# Patient Record
Sex: Female | Born: 1945 | Race: Black or African American | Hispanic: No | Marital: Married | Smoking: Current every day smoker
Health system: Southern US, Community
[De-identification: ages and names within clinical notes are randomized; demographics above are authoritative.]

## PROBLEM LIST (undated history)

## (undated) DIAGNOSIS — I639 Cerebral infarction, unspecified: Secondary | ICD-10-CM

## (undated) DIAGNOSIS — I10 Essential (primary) hypertension: Secondary | ICD-10-CM

## (undated) HISTORY — PX: CHOLECYSTECTOMY: SHX55

## (undated) HISTORY — PX: ABDOMINAL HYSTERECTOMY: SHX81

---

## 2004-05-20 ENCOUNTER — Ambulatory Visit: Payer: Self-pay | Admitting: Internal Medicine

## 2004-06-12 ENCOUNTER — Ambulatory Visit: Payer: Self-pay | Admitting: Internal Medicine

## 2004-08-15 ENCOUNTER — Ambulatory Visit: Payer: Self-pay | Admitting: Internal Medicine

## 2004-08-26 ENCOUNTER — Ambulatory Visit: Payer: Self-pay | Admitting: Gastroenterology

## 2004-09-12 ENCOUNTER — Ambulatory Visit: Payer: Self-pay | Admitting: Gastroenterology

## 2006-05-19 ENCOUNTER — Ambulatory Visit: Payer: Self-pay

## 2006-06-05 ENCOUNTER — Ambulatory Visit: Payer: Self-pay | Admitting: Otolaryngology

## 2006-11-30 ENCOUNTER — Ambulatory Visit: Payer: Self-pay | Admitting: Otolaryngology

## 2007-03-03 ENCOUNTER — Ambulatory Visit: Payer: Self-pay | Admitting: Otolaryngology

## 2007-09-13 ENCOUNTER — Ambulatory Visit: Payer: Self-pay | Admitting: Otolaryngology

## 2007-12-09 ENCOUNTER — Ambulatory Visit: Payer: Self-pay | Admitting: Internal Medicine

## 2007-12-28 ENCOUNTER — Ambulatory Visit: Payer: Self-pay | Admitting: Internal Medicine

## 2008-03-31 ENCOUNTER — Ambulatory Visit: Payer: Self-pay | Admitting: Otolaryngology

## 2008-06-20 ENCOUNTER — Ambulatory Visit: Payer: Self-pay | Admitting: Internal Medicine

## 2008-07-20 ENCOUNTER — Ambulatory Visit: Payer: Self-pay | Admitting: Internal Medicine

## 2008-10-12 ENCOUNTER — Ambulatory Visit: Payer: Self-pay | Admitting: Otolaryngology

## 2009-01-09 ENCOUNTER — Ambulatory Visit: Payer: Self-pay | Admitting: Internal Medicine

## 2009-11-13 ENCOUNTER — Other Ambulatory Visit: Payer: Self-pay | Admitting: Otolaryngology

## 2010-01-15 ENCOUNTER — Ambulatory Visit: Payer: Self-pay

## 2010-12-11 ENCOUNTER — Other Ambulatory Visit: Payer: Self-pay | Admitting: Otolaryngology

## 2010-12-17 ENCOUNTER — Ambulatory Visit: Payer: Self-pay | Admitting: Otolaryngology

## 2011-01-17 ENCOUNTER — Ambulatory Visit: Payer: Self-pay | Admitting: Internal Medicine

## 2011-12-08 ENCOUNTER — Other Ambulatory Visit: Payer: Self-pay | Admitting: Otolaryngology

## 2011-12-08 LAB — T4, FREE: Free Thyroxine: 1.24 ng/dL (ref 0.76–1.46)

## 2012-02-10 ENCOUNTER — Ambulatory Visit: Payer: Self-pay | Admitting: Internal Medicine

## 2012-02-10 ENCOUNTER — Other Ambulatory Visit: Payer: Self-pay

## 2012-02-10 LAB — CBC WITH DIFFERENTIAL/PLATELET
Basophil #: 0 10*3/uL (ref 0.0–0.1)
Basophil %: 0.6 %
Eosinophil #: 0 10*3/uL (ref 0.0–0.7)
Eosinophil %: 0.7 %
Lymphocyte #: 2.2 10*3/uL (ref 1.0–3.6)
Lymphocyte %: 32.5 %
MCHC: 31.9 g/dL — ABNORMAL LOW (ref 32.0–36.0)
Monocyte #: 0.5 x10 3/mm (ref 0.2–0.9)
Neutrophil %: 58.3 %
Platelet: 246 10*3/uL (ref 150–440)
RBC: 5.02 10*6/uL (ref 3.80–5.20)
RDW: 16.1 % — ABNORMAL HIGH (ref 11.5–14.5)

## 2012-02-10 LAB — BASIC METABOLIC PANEL
Chloride: 110 mmol/L — ABNORMAL HIGH (ref 98–107)
Co2: 29 mmol/L (ref 21–32)
Osmolality: 280 (ref 275–301)
Sodium: 142 mmol/L (ref 136–145)

## 2012-02-10 LAB — URINALYSIS, COMPLETE
Bilirubin,UR: NEGATIVE
Glucose,UR: NEGATIVE mg/dL (ref 0–75)
Ketone: NEGATIVE
Leukocyte Esterase: NEGATIVE
Nitrite: NEGATIVE
Ph: 5 (ref 4.5–8.0)
Protein: NEGATIVE
RBC,UR: 2 /HPF (ref 0–5)
Squamous Epithelial: 3
WBC UR: 2 /HPF (ref 0–5)

## 2012-02-10 LAB — HEPATIC FUNCTION PANEL A (ARMC)
Alkaline Phosphatase: 109 U/L (ref 50–136)
SGOT(AST): 20 U/L (ref 15–37)

## 2013-01-21 ENCOUNTER — Ambulatory Visit: Payer: Self-pay | Admitting: Otolaryngology

## 2013-01-21 LAB — T4, FREE: Free Thyroxine: 1.2 ng/dL (ref 0.76–1.46)

## 2013-01-21 LAB — TSH: Thyroid Stimulating Horm: 0.23 u[IU]/mL — ABNORMAL LOW

## 2013-02-08 ENCOUNTER — Ambulatory Visit: Payer: Self-pay | Admitting: Otolaryngology

## 2014-02-20 ENCOUNTER — Ambulatory Visit: Payer: Self-pay | Admitting: Otolaryngology

## 2014-02-20 LAB — TSH: Thyroid Stimulating Horm: 0.278 u[IU]/mL — ABNORMAL LOW

## 2014-02-20 LAB — T4, FREE: Free Thyroxine: 1.14 ng/dL (ref 0.76–1.46)

## 2015-03-07 ENCOUNTER — Other Ambulatory Visit
Admission: RE | Admit: 2015-03-07 | Discharge: 2015-03-07 | Disposition: A | Discharge status: Home / Self Care | Payer: 59 | Source: Ambulatory Visit | Attending: Otolaryngology | Admitting: Otolaryngology

## 2015-03-07 ENCOUNTER — Other Ambulatory Visit
Admission: RE | Admit: 2015-03-07 | Discharge: 2015-03-07 | Disposition: A | Discharge status: Home / Self Care | Payer: Medicare Other | Source: Ambulatory Visit | Attending: Otolaryngology | Admitting: Otolaryngology

## 2015-03-07 DIAGNOSIS — E041 Nontoxic single thyroid nodule: Secondary | ICD-10-CM | POA: Insufficient documentation

## 2015-03-07 DIAGNOSIS — R0981 Nasal congestion: Secondary | ICD-10-CM | POA: Diagnosis not present

## 2015-03-07 LAB — T4, FREE: Free T4: 0.89 ng/dL (ref 0.61–1.12)

## 2015-03-07 LAB — TSH: TSH: 0.716 u[IU]/mL (ref 0.350–4.500)

## 2015-03-09 LAB — T3, FREE: T3 FREE: 2.4 pg/mL (ref 2.0–4.4)

## 2016-03-17 ENCOUNTER — Other Ambulatory Visit
Admission: RE | Admit: 2016-03-17 | Discharge: 2016-03-17 | Disposition: A | Discharge status: Home / Self Care | Payer: Medicare Other | Source: Ambulatory Visit | Attending: Otolaryngology | Admitting: Otolaryngology

## 2016-03-17 DIAGNOSIS — E041 Nontoxic single thyroid nodule: Secondary | ICD-10-CM | POA: Insufficient documentation

## 2016-03-17 LAB — T4, FREE: Free T4: 0.91 ng/dL (ref 0.61–1.12)

## 2016-03-17 LAB — TSH: TSH: 0.766 u[IU]/mL (ref 0.350–4.500)

## 2016-03-18 LAB — T3, FREE: T3 FREE: 2.5 pg/mL (ref 2.0–4.4)

## 2017-04-24 ENCOUNTER — Other Ambulatory Visit
Admission: RE | Admit: 2017-04-24 | Discharge: 2017-04-24 | Disposition: A | Discharge status: Home / Self Care | Payer: Medicare Other | Source: Ambulatory Visit | Attending: Otolaryngology | Admitting: Otolaryngology

## 2017-04-24 DIAGNOSIS — E041 Nontoxic single thyroid nodule: Secondary | ICD-10-CM | POA: Diagnosis present

## 2017-04-24 LAB — TSH: TSH: 0.391 u[IU]/mL (ref 0.350–4.500)

## 2017-04-24 LAB — T4, FREE: Free T4: 1.09 ng/dL (ref 0.61–1.12)

## 2017-04-25 LAB — T3, FREE: T3, Free: 2.4 pg/mL (ref 2.0–4.4)

## 2018-06-07 ENCOUNTER — Other Ambulatory Visit
Admission: RE | Admit: 2018-06-07 | Discharge: 2018-06-07 | Disposition: A | Discharge status: Home / Self Care | Payer: Medicare HMO | Attending: Otolaryngology | Admitting: Otolaryngology

## 2018-06-07 DIAGNOSIS — E041 Nontoxic single thyroid nodule: Secondary | ICD-10-CM | POA: Diagnosis not present

## 2018-06-07 LAB — TSH: TSH: 0.926 u[IU]/mL (ref 0.350–4.500)

## 2018-06-07 LAB — T4, FREE: Free T4: 0.85 ng/dL (ref 0.82–1.77)

## 2018-06-08 LAB — T3, FREE: T3, Free: 2.6 pg/mL (ref 2.0–4.4)

## 2018-06-16 DIAGNOSIS — H6123 Impacted cerumen, bilateral: Secondary | ICD-10-CM | POA: Diagnosis not present

## 2018-06-16 DIAGNOSIS — E041 Nontoxic single thyroid nodule: Secondary | ICD-10-CM | POA: Diagnosis not present

## 2018-10-22 DIAGNOSIS — L258 Unspecified contact dermatitis due to other agents: Secondary | ICD-10-CM | POA: Diagnosis not present

## 2018-10-22 DIAGNOSIS — L309 Dermatitis, unspecified: Secondary | ICD-10-CM | POA: Diagnosis not present

## 2018-12-10 ENCOUNTER — Other Ambulatory Visit: Payer: Self-pay

## 2018-12-10 DIAGNOSIS — Z20822 Contact with and (suspected) exposure to covid-19: Secondary | ICD-10-CM

## 2018-12-13 ENCOUNTER — Telehealth: Payer: Self-pay

## 2018-12-13 LAB — NOVEL CORONAVIRUS, NAA: SARS-CoV-2, NAA: NOT DETECTED

## 2018-12-13 NOTE — Telephone Encounter (Signed)
Received call from patient checking Covid results.  Advised results negative.   

## 2019-02-25 DIAGNOSIS — H6123 Impacted cerumen, bilateral: Secondary | ICD-10-CM | POA: Diagnosis not present

## 2019-02-28 ENCOUNTER — Ambulatory Visit: Payer: Medicare HMO | Attending: Internal Medicine

## 2019-02-28 ENCOUNTER — Other Ambulatory Visit: Payer: Self-pay

## 2019-02-28 DIAGNOSIS — Z23 Encounter for immunization: Secondary | ICD-10-CM | POA: Insufficient documentation

## 2019-02-28 NOTE — Progress Notes (Signed)
   Covid-19 Vaccination Clinic  Name:  Susan Farrell    MRN: [id number] DOB: Oct 31, 1945  02/28/2019  [name] was observed post Covid-19 immunization for 15 minutes without incidence. She was provided with Vaccine Information Sheet and instruction to access the V-Safe system.   [name] was instructed to call 911 with any severe reactions post vaccine: Marland Kitchen Difficulty breathing  . Swelling of your face and throat  . A fast heartbeat  . A bad rash all over your body  . Dizziness and weakness    Immunizations Administered    Name Date Dose VIS Date Route   Moderna COVID-19 Vaccine 02/28/2019 10:31 AM 0.5 mL 12/21/2018 Intramuscular   Manufacturer: Moderna   Lot: [id number]   NDC: 41991-444-58

## 2019-03-08 DIAGNOSIS — L930 Discoid lupus erythematosus: Secondary | ICD-10-CM | POA: Diagnosis not present

## 2019-03-30 ENCOUNTER — Other Ambulatory Visit: Payer: Self-pay

## 2019-03-30 ENCOUNTER — Ambulatory Visit: Payer: Medicare HMO | Attending: Internal Medicine

## 2019-03-30 DIAGNOSIS — Z23 Encounter for immunization: Secondary | ICD-10-CM | POA: Insufficient documentation

## 2019-03-30 NOTE — Progress Notes (Signed)
   Covid-19 Vaccination Clinic  Name:  Susan Farrell    MRN: [id number] DOB: May 07, 1945  03/30/2019  [name] was observed post Covid-19 immunization for 15 minutes without incident. She was provided with Vaccine Information Sheet and instruction to access the V-Safe system.   [name] was instructed to call 911 with any severe reactions post vaccine: Marland Kitchen Difficulty breathing  . Swelling of face and throat  . A fast heartbeat  . A bad rash all over body  . Dizziness and weakness   Immunizations Administered    Name Date Dose VIS Date Route   Moderna COVID-19 Vaccine 03/30/2019 10:19 AM 0.5 mL 12/21/2018 Intramuscular   Manufacturer: Moderna   Lot: [id number]   NDC: 68372-902-11

## 2019-05-12 DIAGNOSIS — E039 Hypothyroidism, unspecified: Secondary | ICD-10-CM | POA: Diagnosis not present

## 2019-05-12 DIAGNOSIS — Z720 Tobacco use: Secondary | ICD-10-CM | POA: Diagnosis not present

## 2019-05-12 DIAGNOSIS — R7309 Other abnormal glucose: Secondary | ICD-10-CM | POA: Diagnosis not present

## 2019-05-12 DIAGNOSIS — E049 Nontoxic goiter, unspecified: Secondary | ICD-10-CM | POA: Diagnosis not present

## 2019-05-12 DIAGNOSIS — F17210 Nicotine dependence, cigarettes, uncomplicated: Secondary | ICD-10-CM | POA: Diagnosis not present

## 2019-05-12 DIAGNOSIS — Z0000 Encounter for general adult medical examination without abnormal findings: Secondary | ICD-10-CM | POA: Diagnosis not present

## 2019-05-12 DIAGNOSIS — Z780 Asymptomatic menopausal state: Secondary | ICD-10-CM | POA: Diagnosis not present

## 2019-05-12 DIAGNOSIS — D649 Anemia, unspecified: Secondary | ICD-10-CM | POA: Diagnosis not present

## 2019-05-12 DIAGNOSIS — Z7689 Persons encountering health services in other specified circumstances: Secondary | ICD-10-CM | POA: Diagnosis not present

## 2019-05-13 ENCOUNTER — Other Ambulatory Visit: Payer: Self-pay | Admitting: Internal Medicine

## 2019-05-13 DIAGNOSIS — Z1231 Encounter for screening mammogram for malignant neoplasm of breast: Secondary | ICD-10-CM

## 2019-05-17 ENCOUNTER — Ambulatory Visit
Admission: RE | Admit: 2019-05-17 | Discharge: 2019-05-17 | Disposition: A | Discharge status: Home / Self Care | Payer: Medicare HMO | Source: Ambulatory Visit | Attending: Internal Medicine | Admitting: Internal Medicine

## 2019-05-17 DIAGNOSIS — M8588 Other specified disorders of bone density and structure, other site: Secondary | ICD-10-CM | POA: Diagnosis not present

## 2019-05-17 DIAGNOSIS — Z1231 Encounter for screening mammogram for malignant neoplasm of breast: Secondary | ICD-10-CM | POA: Insufficient documentation

## 2019-06-16 ENCOUNTER — Other Ambulatory Visit
Admission: RE | Admit: 2019-06-16 | Discharge: 2019-06-16 | Disposition: A | Discharge status: Home / Self Care | Payer: Medicare HMO | Source: Home / Self Care | Attending: Dermatology | Admitting: Dermatology

## 2019-06-16 ENCOUNTER — Other Ambulatory Visit: Payer: Self-pay

## 2019-06-16 ENCOUNTER — Other Ambulatory Visit
Admission: RE | Admit: 2019-06-16 | Discharge: 2019-06-16 | Disposition: A | Discharge status: Home / Self Care | Payer: Medicare HMO | Attending: Dermatology | Admitting: Dermatology

## 2019-06-16 DIAGNOSIS — L930 Discoid lupus erythematosus: Secondary | ICD-10-CM | POA: Diagnosis not present

## 2019-06-16 DIAGNOSIS — E041 Nontoxic single thyroid nodule: Secondary | ICD-10-CM | POA: Diagnosis not present

## 2019-06-16 LAB — T4, FREE: Free T4: 0.89 ng/dL (ref 0.61–1.12)

## 2019-06-16 LAB — TSH: TSH: 0.597 u[IU]/mL (ref 0.350–4.500)

## 2019-06-17 LAB — ANA: Anti Nuclear Antibody (ANA): POSITIVE — AB

## 2019-06-17 LAB — T3, FREE: T3, Free: 2.5 pg/mL (ref 2.0–4.4)

## 2019-06-21 DIAGNOSIS — E041 Nontoxic single thyroid nodule: Secondary | ICD-10-CM | POA: Diagnosis not present

## 2019-07-21 DIAGNOSIS — D509 Iron deficiency anemia, unspecified: Secondary | ICD-10-CM | POA: Diagnosis not present

## 2019-07-21 DIAGNOSIS — Z1211 Encounter for screening for malignant neoplasm of colon: Secondary | ICD-10-CM | POA: Diagnosis not present

## 2019-09-06 DIAGNOSIS — Z1329 Encounter for screening for other suspected endocrine disorder: Secondary | ICD-10-CM | POA: Diagnosis not present

## 2019-09-06 DIAGNOSIS — D649 Anemia, unspecified: Secondary | ICD-10-CM | POA: Diagnosis not present

## 2019-09-06 DIAGNOSIS — R7303 Prediabetes: Secondary | ICD-10-CM | POA: Diagnosis not present

## 2019-09-06 DIAGNOSIS — I10 Essential (primary) hypertension: Secondary | ICD-10-CM | POA: Diagnosis not present

## 2019-09-06 DIAGNOSIS — E039 Hypothyroidism, unspecified: Secondary | ICD-10-CM | POA: Diagnosis not present

## 2019-09-06 DIAGNOSIS — Z131 Encounter for screening for diabetes mellitus: Secondary | ICD-10-CM | POA: Diagnosis not present

## 2019-09-06 DIAGNOSIS — Z0000 Encounter for general adult medical examination without abnormal findings: Secondary | ICD-10-CM | POA: Diagnosis not present

## 2019-09-13 DIAGNOSIS — Z79899 Other long term (current) drug therapy: Secondary | ICD-10-CM | POA: Diagnosis not present

## 2019-09-13 DIAGNOSIS — M8580 Other specified disorders of bone density and structure, unspecified site: Secondary | ICD-10-CM | POA: Insufficient documentation

## 2019-09-13 DIAGNOSIS — E049 Nontoxic goiter, unspecified: Secondary | ICD-10-CM | POA: Insufficient documentation

## 2019-09-13 DIAGNOSIS — D649 Anemia, unspecified: Secondary | ICD-10-CM | POA: Diagnosis not present

## 2019-09-15 ENCOUNTER — Other Ambulatory Visit
Admission: RE | Admit: 2019-09-15 | Discharge: 2019-09-15 | Disposition: A | Discharge status: Home / Self Care | Payer: Medicare HMO | Attending: Otolaryngology | Admitting: Otolaryngology

## 2019-09-15 ENCOUNTER — Other Ambulatory Visit: Payer: Self-pay

## 2019-09-15 DIAGNOSIS — E041 Nontoxic single thyroid nodule: Secondary | ICD-10-CM | POA: Diagnosis not present

## 2019-09-15 LAB — T4, FREE: Free T4: 1.12 ng/dL (ref 0.61–1.12)

## 2019-09-15 LAB — TSH: TSH: 0.354 u[IU]/mL (ref 0.350–4.500)

## 2019-09-16 DIAGNOSIS — Z1211 Encounter for screening for malignant neoplasm of colon: Secondary | ICD-10-CM | POA: Diagnosis not present

## 2019-09-16 DIAGNOSIS — Z01812 Encounter for preprocedural laboratory examination: Secondary | ICD-10-CM | POA: Diagnosis not present

## 2019-09-16 LAB — T3, FREE: T3, Free: 2.7 pg/mL (ref 2.0–4.4)

## 2019-09-20 DIAGNOSIS — Z1211 Encounter for screening for malignant neoplasm of colon: Secondary | ICD-10-CM | POA: Diagnosis not present

## 2019-09-20 DIAGNOSIS — K5730 Diverticulosis of large intestine without perforation or abscess without bleeding: Secondary | ICD-10-CM | POA: Diagnosis not present

## 2019-09-20 DIAGNOSIS — K640 First degree hemorrhoids: Secondary | ICD-10-CM | POA: Diagnosis not present

## 2020-01-19 ENCOUNTER — Other Ambulatory Visit
Admission: RE | Admit: 2020-01-19 | Discharge: 2020-01-19 | Disposition: A | Discharge status: Home / Self Care | Payer: Medicare HMO | Attending: Otolaryngology | Admitting: Otolaryngology

## 2020-01-19 DIAGNOSIS — E041 Nontoxic single thyroid nodule: Secondary | ICD-10-CM | POA: Insufficient documentation

## 2020-01-19 LAB — TSH: TSH: 0.618 u[IU]/mL (ref 0.350–4.500)

## 2020-01-19 LAB — T4, FREE: Free T4: 1.19 ng/dL — ABNORMAL HIGH (ref 0.61–1.12)

## 2020-01-20 LAB — T3, FREE: T3, Free: 2.2 pg/mL (ref 2.0–4.4)

## 2020-01-23 DIAGNOSIS — E060 Acute thyroiditis: Secondary | ICD-10-CM | POA: Diagnosis not present

## 2020-01-23 DIAGNOSIS — E041 Nontoxic single thyroid nodule: Secondary | ICD-10-CM | POA: Diagnosis not present

## 2020-03-19 DIAGNOSIS — Z0000 Encounter for general adult medical examination without abnormal findings: Secondary | ICD-10-CM | POA: Diagnosis not present

## 2020-03-19 DIAGNOSIS — E049 Nontoxic goiter, unspecified: Secondary | ICD-10-CM | POA: Diagnosis not present

## 2020-03-19 DIAGNOSIS — E039 Hypothyroidism, unspecified: Secondary | ICD-10-CM | POA: Diagnosis not present

## 2020-03-19 DIAGNOSIS — Z720 Tobacco use: Secondary | ICD-10-CM | POA: Diagnosis not present

## 2020-03-19 DIAGNOSIS — F17210 Nicotine dependence, cigarettes, uncomplicated: Secondary | ICD-10-CM | POA: Diagnosis not present

## 2020-03-19 DIAGNOSIS — D649 Anemia, unspecified: Secondary | ICD-10-CM | POA: Diagnosis not present

## 2020-03-19 DIAGNOSIS — Z79899 Other long term (current) drug therapy: Secondary | ICD-10-CM | POA: Diagnosis not present

## 2020-06-05 DIAGNOSIS — M8580 Other specified disorders of bone density and structure, unspecified site: Secondary | ICD-10-CM | POA: Diagnosis not present

## 2020-06-05 DIAGNOSIS — D649 Anemia, unspecified: Secondary | ICD-10-CM | POA: Diagnosis not present

## 2020-06-05 DIAGNOSIS — R1032 Left lower quadrant pain: Secondary | ICD-10-CM | POA: Diagnosis not present

## 2020-06-11 ENCOUNTER — Other Ambulatory Visit (HOSPITAL_COMMUNITY): Payer: Self-pay | Admitting: Internal Medicine

## 2020-06-11 ENCOUNTER — Other Ambulatory Visit: Payer: Self-pay | Admitting: Internal Medicine

## 2020-06-11 DIAGNOSIS — R1032 Left lower quadrant pain: Secondary | ICD-10-CM

## 2020-06-12 ENCOUNTER — Ambulatory Visit
Admission: RE | Admit: 2020-06-12 | Discharge: 2020-06-12 | Disposition: A | Discharge status: Home / Self Care | Payer: Medicare HMO | Source: Ambulatory Visit | Attending: Internal Medicine | Admitting: Internal Medicine

## 2020-06-12 ENCOUNTER — Emergency Department: Payer: Medicare HMO | Admitting: Certified Registered Nurse Anesthetist

## 2020-06-12 ENCOUNTER — Encounter
Admission: EM | Disposition: A | Discharge status: Home / Self Care | Payer: Self-pay | Source: Home / Self Care | Attending: Vascular Surgery

## 2020-06-12 ENCOUNTER — Other Ambulatory Visit: Payer: Self-pay

## 2020-06-12 ENCOUNTER — Inpatient Hospital Stay
Admission: EM | Admit: 2020-06-12 | Discharge: 2020-06-14 | DRG: 269 | Disposition: A | Discharge status: Home / Self Care | Payer: Medicare HMO | Attending: Vascular Surgery | Admitting: Vascular Surgery

## 2020-06-12 ENCOUNTER — Encounter: Payer: Self-pay | Admitting: Emergency Medicine

## 2020-06-12 DIAGNOSIS — I713 Abdominal aortic aneurysm, ruptured, unspecified: Secondary | ICD-10-CM | POA: Diagnosis present

## 2020-06-12 DIAGNOSIS — I714 Abdominal aortic aneurysm, without rupture, unspecified: Secondary | ICD-10-CM

## 2020-06-12 DIAGNOSIS — R509 Fever, unspecified: Secondary | ICD-10-CM | POA: Diagnosis not present

## 2020-06-12 DIAGNOSIS — F17210 Nicotine dependence, cigarettes, uncomplicated: Secondary | ICD-10-CM | POA: Diagnosis not present

## 2020-06-12 DIAGNOSIS — Z9049 Acquired absence of other specified parts of digestive tract: Secondary | ICD-10-CM | POA: Diagnosis not present

## 2020-06-12 DIAGNOSIS — Z90710 Acquired absence of both cervix and uterus: Secondary | ICD-10-CM

## 2020-06-12 DIAGNOSIS — I739 Peripheral vascular disease, unspecified: Secondary | ICD-10-CM | POA: Diagnosis not present

## 2020-06-12 DIAGNOSIS — Z720 Tobacco use: Secondary | ICD-10-CM | POA: Diagnosis not present

## 2020-06-12 DIAGNOSIS — Z79890 Hormone replacement therapy (postmenopausal): Secondary | ICD-10-CM

## 2020-06-12 DIAGNOSIS — R1032 Left lower quadrant pain: Secondary | ICD-10-CM | POA: Insufficient documentation

## 2020-06-12 DIAGNOSIS — E039 Hypothyroidism, unspecified: Secondary | ICD-10-CM | POA: Diagnosis present

## 2020-06-12 DIAGNOSIS — N8189 Other female genital prolapse: Secondary | ICD-10-CM | POA: Diagnosis not present

## 2020-06-12 DIAGNOSIS — Z79899 Other long term (current) drug therapy: Secondary | ICD-10-CM

## 2020-06-12 DIAGNOSIS — E278 Other specified disorders of adrenal gland: Secondary | ICD-10-CM | POA: Diagnosis not present

## 2020-06-12 DIAGNOSIS — K449 Diaphragmatic hernia without obstruction or gangrene: Secondary | ICD-10-CM | POA: Diagnosis not present

## 2020-06-12 DIAGNOSIS — Z20822 Contact with and (suspected) exposure to covid-19: Secondary | ICD-10-CM | POA: Diagnosis not present

## 2020-06-12 DIAGNOSIS — I10 Essential (primary) hypertension: Secondary | ICD-10-CM | POA: Diagnosis present

## 2020-06-12 HISTORY — PX: ENDOVASCULAR REPAIR/STENT GRAFT: CATH118280

## 2020-06-12 LAB — COMPREHENSIVE METABOLIC PANEL
ALT: 11 U/L (ref 0–44)
AST: 28 U/L (ref 15–41)
Albumin: 3.9 g/dL (ref 3.5–5.0)
Alkaline Phosphatase: 60 U/L (ref 38–126)
Anion gap: 10 (ref 5–15)
BUN: 10 mg/dL (ref 8–23)
CO2: 26 mmol/L (ref 22–32)
Calcium: 8.9 mg/dL (ref 8.9–10.3)
Chloride: 106 mmol/L (ref 98–111)
Creatinine, Ser: 0.75 mg/dL (ref 0.44–1.00)
GFR, Estimated: >60 mL/min (ref >60)
Glucose, Bld: 98 mg/dL (ref 70–99)
Potassium: 3.6 mmol/L (ref 3.5–5.1)
Sodium: 142 mmol/L (ref 135–145)
Total Bilirubin: 0.6 mg/dL (ref 0.3–1.2)
Total Protein: 7.8 g/dL (ref 6.5–8.1)

## 2020-06-12 LAB — RESP PANEL BY RT-PCR (FLU A&B, COVID) ARPGX2
Influenza A by PCR: NEGATIVE
Influenza B by PCR: NEGATIVE
SARS Coronavirus 2 by RT PCR: NEGATIVE

## 2020-06-12 LAB — CBC
HCT: 34.7 % — ABNORMAL LOW (ref 36.0–46.0)
Hemoglobin: 11.5 g/dL — ABNORMAL LOW (ref 12.0–15.0)
MCH: 24.7 pg — ABNORMAL LOW (ref 26.0–34.0)
MCHC: 33.1 g/dL (ref 30.0–36.0)
MCV: 74.5 fL — ABNORMAL LOW (ref 80.0–100.0)
Platelets: 177 10*3/uL (ref 150–400)
RBC: 4.66 MIL/uL (ref 3.87–5.11)
RDW: 17.2 % — ABNORMAL HIGH (ref 11.5–15.5)
WBC: 5.1 10*3/uL (ref 4.0–10.5)
nRBC: 0 % (ref 0.0–0.2)

## 2020-06-12 LAB — GLUCOSE, CAPILLARY: Glucose-Capillary: 147 mg/dL — ABNORMAL HIGH (ref 70–99)

## 2020-06-12 LAB — LIPASE, BLOOD: Lipase: 40 U/L (ref 11–51)

## 2020-06-12 LAB — ABO/RH: ABO/RH(D): A POS

## 2020-06-12 LAB — PROTIME-INR
INR: 1.2 (ref 0.8–1.2)
Prothrombin Time: 14.8 seconds (ref 11.4–15.2)

## 2020-06-12 LAB — APTT: aPTT: 36 seconds (ref 24–36)

## 2020-06-12 LAB — MRSA PCR SCREENING: MRSA by PCR: NEGATIVE

## 2020-06-12 SURGERY — ENDOVASCULAR STENT GRAFT (AAA)
Anesthesia: General

## 2020-06-12 MED ORDER — GUAIFENESIN-DM 100-10 MG/5ML PO SYRP: 15.0000 mL | ORAL_SOLUTION | ORAL | Status: DC | PRN

## 2020-06-12 MED ORDER — CHLORHEXIDINE GLUCONATE CLOTH 2 % EX PADS
6.0000 | MEDICATED_PAD | Freq: Every day | CUTANEOUS | Status: DC
  Administered 2020-06-13: 6 via TOPICAL

## 2020-06-12 MED ORDER — DEXAMETHASONE SODIUM PHOSPHATE 10 MG/ML IJ SOLN
INTRAMUSCULAR | Status: DC | PRN
  Administered 2020-06-12: 8 mg via INTRAVENOUS

## 2020-06-12 MED ORDER — SODIUM CHLORIDE (PF) 0.9 % IJ SOLN
INTRAMUSCULAR | Status: AC
  Filled 2020-06-12: qty 20

## 2020-06-12 MED ORDER — LEVOTHYROXINE SODIUM 100 MCG PO TABS
100.0000 ug | ORAL_TABLET | Freq: Every day | ORAL | Status: DC
  Administered 2020-06-13 – 2020-06-14 (×2): 100 ug via ORAL
  Filled 2020-06-12 (×3): qty 1

## 2020-06-12 MED ORDER — FENTANYL CITRATE (PF) 100 MCG/2ML IJ SOLN
INTRAMUSCULAR | Status: AC
  Filled 2020-06-12: qty 2

## 2020-06-12 MED ORDER — HYDRALAZINE HCL 20 MG/ML IJ SOLN
INTRAMUSCULAR | Status: DC | PRN
  Administered 2020-06-12: 5 mg via INTRAVENOUS

## 2020-06-12 MED ORDER — PROPOFOL 10 MG/ML IV BOLUS
INTRAVENOUS | Status: AC
  Filled 2020-06-12: qty 20

## 2020-06-12 MED ORDER — MORPHINE SULFATE (PF) 4 MG/ML IV SOLN
2.0000 mg | INTRAVENOUS | Status: DC | PRN
  Administered 2020-06-12: 4 mg via INTRAVENOUS

## 2020-06-12 MED ORDER — ALUM & MAG HYDROXIDE-SIMETH 200-200-20 MG/5ML PO SUSP: 15.0000 mL | ORAL | Status: DC | PRN

## 2020-06-12 MED ORDER — CEFAZOLIN SODIUM 1 G IJ SOLR
INTRAMUSCULAR | Status: AC
  Filled 2020-06-12: qty 20

## 2020-06-12 MED ORDER — HYDRALAZINE HCL 20 MG/ML IJ SOLN: 5.0000 mg | INTRAMUSCULAR | Status: DC | PRN

## 2020-06-12 MED ORDER — LIDOCAINE HCL (PF) 2 % IJ SOLN
INTRAMUSCULAR | Status: AC
  Filled 2020-06-12: qty 2

## 2020-06-12 MED ORDER — SODIUM CHLORIDE 0.9 % IV SOLN: INTRAVENOUS | Status: DC | PRN

## 2020-06-12 MED ORDER — HEPARIN SODIUM (PORCINE) 1000 UNIT/ML IJ SOLN
INTRAMUSCULAR | Status: DC | PRN
  Administered 2020-06-12: 5000 [IU] via INTRAVENOUS

## 2020-06-12 MED ORDER — SODIUM CHLORIDE 0.9 % IV SOLN: INTRAVENOUS | Status: DC

## 2020-06-12 MED ORDER — ONDANSETRON HCL 4 MG/2ML IJ SOLN: 4.0000 mg | Freq: Once | INTRAMUSCULAR | Status: DC | PRN

## 2020-06-12 MED ORDER — EPHEDRINE SULFATE 50 MG/ML IJ SOLN
INTRAMUSCULAR | Status: DC | PRN
  Administered 2020-06-12 (×2): 5 mg via INTRAVENOUS

## 2020-06-12 MED ORDER — VASOPRESSIN 20 UNIT/ML IV SOLN
INTRAVENOUS | Status: AC
  Filled 2020-06-12: qty 1

## 2020-06-12 MED ORDER — METOPROLOL TARTRATE 5 MG/5ML IV SOLN: 2.0000 mg | INTRAVENOUS | Status: DC | PRN

## 2020-06-12 MED ORDER — FENTANYL CITRATE (PF) 100 MCG/2ML IJ SOLN: 25.0000 ug | INTRAMUSCULAR | Status: DC | PRN

## 2020-06-12 MED ORDER — MORPHINE SULFATE (PF) 4 MG/ML IV SOLN
INTRAVENOUS | Status: AC
  Filled 2020-06-12: qty 1

## 2020-06-12 MED ORDER — MAGNESIUM SULFATE 2 GM/50ML IV SOLN
2.0000 g | Freq: Every day | INTRAVENOUS | Status: DC | PRN
  Filled 2020-06-12: qty 50

## 2020-06-12 MED ORDER — CEFAZOLIN SODIUM-DEXTROSE 2-4 GM/100ML-% IV SOLN
2.0000 g | INTRAVENOUS | Status: AC
  Administered 2020-06-12: 2 g via INTRAVENOUS
  Filled 2020-06-12: qty 100

## 2020-06-12 MED ORDER — DEXMEDETOMIDINE HCL 200 MCG/2ML IV SOLN
INTRAVENOUS | Status: DC | PRN
  Administered 2020-06-12: 8 ug via INTRAVENOUS

## 2020-06-12 MED ORDER — IODIXANOL 320 MG/ML IV SOLN
INTRAVENOUS | Status: DC | PRN
  Administered 2020-06-12: 80 mL via INTRA_ARTERIAL

## 2020-06-12 MED ORDER — DOPAMINE-DEXTROSE 3.2-5 MG/ML-% IV SOLN: 3.0000 ug/kg/min | INTRAVENOUS | Status: DC

## 2020-06-12 MED ORDER — POTASSIUM CHLORIDE CRYS ER 20 MEQ PO TBCR: 20.0000 meq | EXTENDED_RELEASE_TABLET | Freq: Every day | ORAL | Status: DC | PRN

## 2020-06-12 MED ORDER — SODIUM CHLORIDE 0.9 % IV SOLN: 500.0000 mL | Freq: Once | INTRAVENOUS | Status: DC | PRN

## 2020-06-12 MED ORDER — SUGAMMADEX SODIUM 200 MG/2ML IV SOLN
INTRAVENOUS | Status: DC | PRN
  Administered 2020-06-12: 137 mg via INTRAVENOUS

## 2020-06-12 MED ORDER — FAMOTIDINE IN NACL 20-0.9 MG/50ML-% IV SOLN
20.0000 mg | Freq: Two times a day (BID) | INTRAVENOUS | Status: DC
  Administered 2020-06-12 – 2020-06-14 (×4): 20 mg via INTRAVENOUS
  Filled 2020-06-12 (×6): qty 50

## 2020-06-12 MED ORDER — DEXAMETHASONE SODIUM PHOSPHATE 10 MG/ML IJ SOLN
INTRAMUSCULAR | Status: AC
  Filled 2020-06-12: qty 1

## 2020-06-12 MED ORDER — DOCUSATE SODIUM 100 MG PO CAPS
100.0000 mg | ORAL_CAPSULE | Freq: Every day | ORAL | Status: DC
  Administered 2020-06-13 – 2020-06-14 (×2): 100 mg via ORAL
  Filled 2020-06-12 (×2): qty 1

## 2020-06-12 MED ORDER — LIDOCAINE HCL URETHRAL/MUCOSAL 2 % EX GEL
CUTANEOUS | Status: AC
  Filled 2020-06-12: qty 5

## 2020-06-12 MED ORDER — SODIUM CHLORIDE 0.9 % IV SOLN
INTRAVENOUS | Status: DC | PRN
  Administered 2020-06-12: 30 ug/min via INTRAVENOUS

## 2020-06-12 MED ORDER — LABETALOL HCL 5 MG/ML IV SOLN: 10.0000 mg | INTRAVENOUS | Status: DC | PRN

## 2020-06-12 MED ORDER — ONDANSETRON HCL 4 MG/2ML IJ SOLN
INTRAMUSCULAR | Status: DC | PRN
  Administered 2020-06-12: 4 mg via INTRAVENOUS

## 2020-06-12 MED ORDER — NITROGLYCERIN IN D5W 200-5 MCG/ML-% IV SOLN: 5.0000 ug/min | INTRAVENOUS | Status: DC

## 2020-06-12 MED ORDER — SODIUM CHLORIDE 0.9% IV SOLUTION
Freq: Once | INTRAVENOUS | Status: DC
  Filled 2020-06-12: qty 250

## 2020-06-12 MED ORDER — FENTANYL CITRATE (PF) 100 MCG/2ML IJ SOLN
INTRAMUSCULAR | Status: DC | PRN
  Administered 2020-06-12: 50 ug via INTRAVENOUS
  Administered 2020-06-12: 25 ug via INTRAVENOUS
  Administered 2020-06-12: 50 ug via INTRAVENOUS
  Administered 2020-06-12: 25 ug via INTRAVENOUS
  Administered 2020-06-12: 50 ug via INTRAVENOUS

## 2020-06-12 MED ORDER — ORAL CARE MOUTH RINSE
15.0000 mL | Freq: Two times a day (BID) | OROMUCOSAL | Status: DC
  Administered 2020-06-12 – 2020-06-13 (×3): 15 mL via OROMUCOSAL

## 2020-06-12 MED ORDER — EPINEPHRINE PF 1 MG/ML IJ SOLN
INTRAMUSCULAR | Status: AC
  Filled 2020-06-12: qty 1

## 2020-06-12 MED ORDER — ONDANSETRON HCL 4 MG/2ML IJ SOLN: 4.0000 mg | Freq: Four times a day (QID) | INTRAMUSCULAR | Status: DC | PRN

## 2020-06-12 MED ORDER — LACTATED RINGERS IV SOLN: INTRAVENOUS | Status: DC | PRN

## 2020-06-12 MED ORDER — ROCURONIUM BROMIDE 100 MG/10ML IV SOLN
INTRAVENOUS | Status: DC | PRN
  Administered 2020-06-12 (×2): 10 mg via INTRAVENOUS
  Administered 2020-06-12: 35 mg via INTRAVENOUS
  Administered 2020-06-12: 20 mg via INTRAVENOUS

## 2020-06-12 MED ORDER — ONDANSETRON HCL 4 MG/2ML IJ SOLN
INTRAMUSCULAR | Status: AC
  Filled 2020-06-12: qty 2

## 2020-06-12 MED ORDER — CEFAZOLIN SODIUM-DEXTROSE 2-4 GM/100ML-% IV SOLN
2.0000 g | Freq: Three times a day (TID) | INTRAVENOUS | Status: AC
  Administered 2020-06-12 – 2020-06-13 (×2): 2 g via INTRAVENOUS
  Filled 2020-06-12 (×3): qty 100

## 2020-06-12 MED ORDER — PHENOL 1.4 % MT LIQD
1.0000 | OROMUCOSAL | Status: DC | PRN
  Filled 2020-06-12: qty 177

## 2020-06-12 MED ORDER — OXYCODONE-ACETAMINOPHEN 5-325 MG PO TABS
1.0000 | ORAL_TABLET | ORAL | Status: DC | PRN
  Administered 2020-06-13: 1 via ORAL
  Filled 2020-06-12: qty 1

## 2020-06-12 MED ORDER — ACETAMINOPHEN 325 MG PO TABS
325.0000 mg | ORAL_TABLET | ORAL | Status: DC | PRN
Start: 2020-06-12 — End: 2020-06-14
  Administered 2020-06-13 – 2020-06-14 (×2): 650 mg via ORAL
  Filled 2020-06-12 (×2): qty 2

## 2020-06-12 MED ORDER — PROPOFOL 10 MG/ML IV BOLUS
INTRAVENOUS | Status: DC | PRN
  Administered 2020-06-12: 30 mg via INTRAVENOUS
  Administered 2020-06-12: 40 mg via INTRAVENOUS
  Administered 2020-06-12: 130 mg via INTRAVENOUS

## 2020-06-12 MED ORDER — ACETAMINOPHEN 650 MG RE SUPP: 325.0000 mg | RECTAL | Status: DC | PRN

## 2020-06-12 MED ORDER — SUCCINYLCHOLINE CHLORIDE 20 MG/ML IJ SOLN
INTRAMUSCULAR | Status: DC | PRN
  Administered 2020-06-12: 100 mg via INTRAVENOUS

## 2020-06-12 SURGICAL SUPPLY — 78 items
BAG DECANTER FOR FLEXI CONT (MISCELLANEOUS) ×1 IMPLANT
BLADE SURG 15 STRL LF DISP TIS (BLADE) IMPLANT
BLADE SURG 15 STRL SS (BLADE) ×1
BLADE SURG SZ11 CARB STEEL (BLADE) ×1 IMPLANT
BOOT SUTURE AID YELLOW STND (SUTURE) ×1 IMPLANT
BRUSH SCRUB EZ  4% CHG (MISCELLANEOUS) ×1
BRUSH SCRUB EZ 4% CHG (MISCELLANEOUS) IMPLANT
CANNULA 5F STIFF (CANNULA) ×1 IMPLANT
CATH ACCU-VU SIZ PIG 5F 70CM (CATHETERS) ×1 IMPLANT
CATH BALLN CODA 9X100X32 (BALLOONS) ×1 IMPLANT
CATH BEACON 5 .035 65 KMP TIP (CATHETERS) ×1 IMPLANT
CATH VERT 5X100 (CATHETERS) ×1 IMPLANT
CLOSURE PERCLOSE PROSTYLE (VASCULAR PRODUCTS) ×5 IMPLANT
COVER DRAPE FLUORO 36X44 (DRAPES) ×2 IMPLANT
COVER PROBE U/S 5X48 (MISCELLANEOUS) ×1 IMPLANT
DERMABOND ADVANCED (GAUZE/BANDAGES/DRESSINGS) ×1
DERMABOND ADVANCED .7 DNX12 (GAUZE/BANDAGES/DRESSINGS) IMPLANT
DEVICE CLOSURE MYNXGRIP 6/7F (Vascular Products) ×1 IMPLANT
DEVICE SAFEGUARD 24CM (GAUZE/BANDAGES/DRESSINGS) ×2 IMPLANT
DEVICE TORQUE (MISCELLANEOUS) ×1 IMPLANT
DRAPE BRACHIAL (DRAPES) ×2 IMPLANT
DRAPE INCISE IOBAN 66X45 STRL (DRAPES) ×2 IMPLANT
DRYSEAL FLEXSHEATH 15FR 33CM (SHEATH) ×1
DRYSEAL FLEXSHEATH 18FR 33CM (SHEATH) ×1
ELECT CAUTERY BLADE 6.4 (BLADE) ×1 IMPLANT
ELECT REM PT RETURN 9FT ADLT (ELECTROSURGICAL) ×2
ELECTRODE REM PT RTRN 9FT ADLT (ELECTROSURGICAL) IMPLANT
EXCLDR TRNK 28.5X14.5X12 16F (Endovascular Graft) ×2 IMPLANT
EXCLUDER TNK 28.5X14.5X12 16F (Endovascular Graft) IMPLANT
GLIDEWIRE ADV .035X260CM (WIRE) ×1 IMPLANT
GLIDEWIRE STIFF .35X180X3 HYDR (WIRE) ×1 IMPLANT
GLOVE SURG ENC MOIS LTX SZ7 (GLOVE) ×4 IMPLANT
GOWN STRL REUS W/ TWL LRG LVL3 (GOWN DISPOSABLE) IMPLANT
GOWN STRL REUS W/ TWL XL LVL3 (GOWN DISPOSABLE) IMPLANT
GOWN STRL REUS W/TWL LRG LVL3 (GOWN DISPOSABLE) ×1
GOWN STRL REUS W/TWL XL LVL3 (GOWN DISPOSABLE) ×2
GUIDEWIRE VERSACORE 260 (WIRE) ×1 IMPLANT
IV NS 500ML (IV SOLUTION) ×1
IV NS 500ML BAXH (IV SOLUTION) IMPLANT
KIT ENCORE 26 ADVANTAGE (KITS) ×1 IMPLANT
KIT MICROPUNCTURE NIT STIFF (SHEATH) ×1 IMPLANT
LEG CONTRALATERAL 16X16X13.5 (Endovascular Graft) ×1 IMPLANT
LEG CONTRALATERAL 16X20X13.5 (Vascular Products) ×1 IMPLANT
LOOP RED MAXI  1X406MM (MISCELLANEOUS) ×2
LOOP VESSEL MAXI 1X406 RED (MISCELLANEOUS) IMPLANT
LOOP VESSEL MINI 0.8X406 BLUE (MISCELLANEOUS) IMPLANT
LOOPS BLUE MINI 0.8X406MM (MISCELLANEOUS) ×2
NDL ENTRY 21GA 7CM ECHOTIP (NEEDLE) IMPLANT
NEEDLE ENTRY 21GA 7CM ECHOTIP (NEEDLE) ×2 IMPLANT
PACK ANGIOGRAPHY (CUSTOM PROCEDURE TRAY) ×2 IMPLANT
PACK BASIN MAJOR ARMC (MISCELLANEOUS) ×1 IMPLANT
SHEATH BRITE TIP 6FRX11 (SHEATH) ×2 IMPLANT
SHEATH BRITE TIP 8FRX11 (SHEATH) ×2 IMPLANT
SHEATH DRYSEAL FLEX 15FR 33CM (SHEATH) IMPLANT
SHEATH DRYSEAL FLEX 18FR 33CM (SHEATH) IMPLANT
SHEATH SHUTTLE 6FRX80 (SHEATH) ×1 IMPLANT
SPONGE XRAY 4X4 16PLY STRL (MISCELLANEOUS) ×4 IMPLANT
STENT GRAFT CONTRALAT 16X13.5 (Endovascular Graft) IMPLANT
STENT GRAFT CONTRALAT 20X13.5 (Vascular Products) IMPLANT
STENT LIFESTREAM 6X37X135 (Permanent Stent) ×1 IMPLANT
SUT MNCRL+ 5-0 UNDYED PC-3 (SUTURE) IMPLANT
SUT MONOCRYL 5-0 (SUTURE) ×1
SUT PROLENE 6 0 BV (SUTURE) ×2 IMPLANT
SUT SILK 2 0 (SUTURE) ×1
SUT SILK 2-0 18XBRD TIE 12 (SUTURE) IMPLANT
SUT SILK 3 0 (SUTURE) ×1
SUT SILK 3-0 18XBRD TIE 12 (SUTURE) IMPLANT
SUT SILK 4 0 (SUTURE) ×1
SUT SILK 4-0 18XBRD TIE 12 (SUTURE) IMPLANT
SUT VIC AB 2-0 CT1 (SUTURE) ×1 IMPLANT
SUT VICRYL+ 3-0 36IN CT-1 (SUTURE) ×1 IMPLANT
SYR BULB IRRIG 60ML STRL (SYRINGE) ×1 IMPLANT
TOWEL OR 17X26 4PK STRL BLUE (TOWEL DISPOSABLE) ×4 IMPLANT
TRAY FOLEY SLVR 14FR LF STAT (SET/KITS/TRAYS/PACK) ×1 IMPLANT
TUBING CONTRAST HIGH PRESS 72 (TUBING) ×1 IMPLANT
VALVE CHECKFLO PERFORMER (SHEATH) ×1 IMPLANT
WIRE AMPLATZ SSTIFF .035X260CM (WIRE) ×2 IMPLANT
WIRE GUIDERIGHT .035X150 (WIRE) ×2 IMPLANT

## 2020-06-12 NOTE — Anesthesia Procedure Notes (Signed)
Procedure Name: Intubation Date/Time: 06/12/2020 11:17 AM Performed by: Malva Cogan, CRNA Pre-anesthesia Checklist: Patient identified, Patient being monitored, Timeout performed, Emergency Drugs available and Suction available Patient Re-evaluated:Patient Re-evaluated prior to induction Oxygen Delivery Method: Circle system utilized Preoxygenation: Pre-oxygenation with 100% oxygen Induction Type: IV induction Ventilation: Mask ventilation without difficulty Laryngoscope Size: 3 and McGraph Grade View: Grade I Tube type: Oral Tube size: 7.0 mm Number of attempts: 1 Airway Equipment and Method: Stylet and Video-laryngoscopy Placement Confirmation: ETT inserted through vocal cords under direct vision,  positive ETCO2 and breath sounds checked- equal and bilateral Secured at: 21 cm Tube secured with: Tape Dental Injury: Teeth and Oropharynx as per pre-operative assessment

## 2020-06-12 NOTE — Anesthesia Preprocedure Evaluation (Addendum)
Anesthesia Evaluation  Patient identified by MRN, date of birth, ID band Patient awake    Reviewed: Allergy & Precautions, NPO status , Patient's Chart, lab work & pertinent test results  Airway Mallampati: III       Dental   Pulmonary Current Smoker,    Pulmonary exam normal        Cardiovascular + Peripheral Vascular Disease       Neuro/Psych negative neurological ROS  negative psych ROS   GI/Hepatic Neg liver ROS, diverticulosis   Endo/Other  Hypothyroidism   Renal/GU negative Renal ROS  negative genitourinary   Musculoskeletal negative musculoskeletal ROS (+)   Abdominal Normal abdominal exam  (+)   Peds negative pediatric ROS (+)  Hematology negative hematology ROS (+)   Anesthesia Other Findings History reviewed. No pertinent past medical history.  Reproductive/Obstetrics                            Anesthesia Physical Anesthesia Plan  ASA: III and emergent  Anesthesia Plan: General   Post-op Pain Management:    Induction: Intravenous  PONV Risk Score and Plan:   Airway Management Planned: Oral ETT  Additional Equipment: Arterial line  Intra-op Plan:   Post-operative Plan: Extubation in OR  Informed Consent: I have reviewed the patients History and Physical, chart, labs and discussed the procedure including the risks, benefits and alternatives for the proposed anesthesia with the patient or authorized representative who has indicated his/her understanding and acceptance.     Dental advisory given  Plan Discussed with: CRNA and Surgeon  Anesthesia Plan Comments:         Anesthesia Quick Evaluation

## 2020-06-12 NOTE — ED Triage Notes (Signed)
C/O intermittent LLQ abdominal pain x 1 month.  Had outpatient CT scan this morning that showed a leaking AAA.  Patietn is AAOx3.  Skin warm and dry. NAD

## 2020-06-12 NOTE — ED Provider Notes (Signed)
[facility] Emergency Department Provider Note   ____________________________________________   Event Date/Time   First MD Initiated Contact with Patient 06/12/20 [phone]     (approximate)  I have reviewed the triage vital signs and the nursing notes.   HISTORY  Chief Complaint Abdominal Pain (Leaking AAA)    HPI Susan Farrell Anneka Studer is a 75 y.o. female referred for abdominal aneurysm  A week ago saw her doctor was diagnosed with diverticulitis, he ordered a CT scan which today evidently showed a aortic aneurysm  Patient reports 8 out of 10 left lower abdominal pain present for at least a week.  Does not wish for any pain medication.  She also reports she has been having intermittent pain in her mid lower to left abdomen for about a month  No fevers or chills no nausea or vomiting.  No chest pain.  Takes Synthroid, also for a few days now has been on antibiotic for what was thought to be "diverticulitis"  No other concerns.  No chest pain no trouble breathing.  No back pain.  No problems with her feet such as cold or blue feet or loss of sensation    History reviewed. No pertinent past medical history.  There are no problems to display for this patient.   Past Surgical History:  Procedure Laterality Date  . ABDOMINAL HYSTERECTOMY    . CHOLECYSTECTOMY      Prior to Admission medications   Medication Sig Start Date End Date Taking? Authorizing Provider  Calcium Carbonate-Vitamin D 600-200 MG-UNIT TABS Take 1 tablet by mouth daily.   Yes [provider]  Multiple Vitamins-Minerals (WOMENS MULTIVITAMIN + COLLAGEN PO) Take 1 tablet by mouth daily.   Yes [provider]  SYNTHROID 100 MCG tablet Take 100 mcg by mouth daily. 04/15/20  Yes [provider]    Allergies Patient has no known allergies.  History reviewed. No pertinent family history.  Social History Social History   Tobacco Use  . Smoking status: Current  Every Day Smoker    Types: Cigarettes  . Smokeless tobacco: Current User    Review of Systems Constitutional: No fever/chills Eyes: No visual changes. ENT: No sore throat. Cardiovascular: Denies chest pain. Respiratory: Denies shortness of breath. Gastrointestinal: See HPI Genitourinary: Negative for dysuria. Musculoskeletal: Negative for back pain. Skin: Negative for rash. Neurological: Negative for numbness.    ____________________________________________   PHYSICAL EXAM:  VITAL SIGNS: ED Triage Vitals  Enc Vitals Group     BP 06/12/20 0958 (!) 162/85     Pulse Rate 06/12/20 0958 87     Resp 06/12/20 0958 14     Temp 06/12/20 0958 98 F (36.7 C)     Temp src --      SpO2 06/12/20 0958 100 %     Weight 06/12/20 0954 151 lb (68.5 kg)     Height 06/12/20 0954 5\' 7"  (1.702 m)     Head Circumference --      Peak Flow --      Pain Score --      Pain Loc --      Pain Edu? --      Excl. in GC? --     Constitutional: Alert and oriented. Well appearing and in no acute distress. Eyes: Conjunctivae are normal. Head: Atraumatic. Nose: No congestion/rhinnorhea. Mouth/Throat: Mucous membranes are moist. Neck: No stridor.  Cardiovascular: Normal rate, regular rhythm. Grossly normal heart sounds.  Good peripheral circulation. Respiratory: Normal respiratory effort.  No retractions. Lungs CTAB. Gastrointestinal: Soft and nondistended.  I did not palpate the abdomen deeply Musculoskeletal: No lower extremity tenderness nor edema. Neurologic:  Normal speech and language. No gross focal neurologic deficits are appreciated.  Skin:  Skin is warm, dry and intact. No rash noted. Psychiatric: Mood and affect are normal. Speech and behavior are normal.  ____________________________________________   LABS (all labs ordered are listed, but only abnormal results are displayed)  Labs Reviewed  CBC - Abnormal; Notable for the following components:      Result Value   Hemoglobin  11.5 (*)    HCT 34.7 (*)    MCV 74.5 (*)    MCH 24.7 (*)    RDW 17.2 (*)    All other components within normal limits  RESP PANEL BY RT-PCR (FLU A&B, COVID) ARPGX2  LIPASE, BLOOD  COMPREHENSIVE METABOLIC PANEL  PROTIME-INR  APTT  URINALYSIS, COMPLETE (UACMP) WITH MICROSCOPIC  TYPE AND SCREEN  PREPARE RBC (CROSSMATCH)  ABO/RH   ____________________________________________  EKG  Reviewed interpreted at 10 AM Heart rate 89 QRS 80 QTc 460 Normal sinus rhythm, mild nonspecific T wave abnormality seen laterally no STEMI ____________________________________________  RADIOLOGY  CT ABDOMEN PELVIS WO CONTRAST  Result Date: 06/12/2020 CLINICAL DATA:  Left lower quadrant pain for 3 months. EXAM: CT ABDOMEN AND PELVIS WITHOUT CONTRAST TECHNIQUE: Multidetector CT imaging of the abdomen and pelvis was performed following the standard protocol without IV contrast. COMPARISON:  Clinic note of 06/05/2020.  Prior CT 08/26/2004. FINDINGS: Lower chest: Mild centrilobular emphysema. Normal heart size without pericardial or pleural effusion. Tiny hiatal hernia. Hepatobiliary: Subtle hypoattenuating segment 3 liver mass including at 5.0 x 3.9 cm on 19/2. 2.8 cm lesion in this segment in 2006. Cholecystectomy, without biliary ductal dilatation. Pancreas: Normal, without mass or ductal dilatation. Spleen: Normal in size, without focal abnormality. Adrenals/Urinary Tract: Normal right adrenal gland. Left adrenal thickening with maintenance of adreniform shape. Low-density, suggesting an underlying adenoma. No renal calculi or hydronephrosis. Exophytic interpolar left renal low-density lesion is likely a cyst at 1.0 cm. No bladder calculi. Stomach/Bowel: Normal stomach, without wall thickening. Scattered colonic diverticula. Normal terminal ileum and appendix. Normal small bowel. Vascular/Lymphatic: Aortic atherosclerosis. Beginning at approximately level of the renal arteries, but suboptimally evaluated secondary  to noncontrast technique, is aortic dilatation. On the order of maximally 8.2 x 6.4 cm including on 23/2. This encompasses a focal outpouching at the 2 o'clock position. Periaortic interstitial thickening on 36/2. No extension into the iliacs. The right common iliac is mildly prominent at 1.4 cm. No abdominopelvic adenopathy. Reproductive: Hysterectomy.  No adnexal mass. Other: No significant free fluid. Moderate pelvic floor laxity. No free intraperitoneal air. Musculoskeletal: No acute osseous abnormality. IMPRESSION: 1. Abdominal aortic aneurysm up to 8.2 cm with surrounding periaortic interstitial thickening, indicative of impending rupture. Recommend referral to a vascular specialist. This recommendation follows ACR consensus guidelines: White Paper of the ACR Incidental Findings Committee II on Vascular Findings. J Am Coll Radiol 2013; 10:789-794. The patient is being held in the imaging center and report will be called. 2. Left hepatic lobe mass, enlarged compared to 2006. Favor benign entity such as a hemangioma. This will be better evaluated on follow-up contrast enhanced aortic imaging. 3.  Tiny hiatal hernia. 4. Aortic atherosclerosis (ICD10-I70.0) and emphysema (ICD10-J43.9). These results will be called to the ordering clinician or representative by the Radiologist Assistant, and communication documented in the PACS or [employer]. Electronically Signed   By: Hosie Spangle.D.  On: 06/12/2020 09:17    CT imaging reviewed, personally discussed with Dr. Gilda Crease  Most concerning and key finding of abdominal aortic aneurysm 8.2 cm felt to be indicative of impending rupture.  This was a noncontrast study ____________________________________________   PROCEDURES  Procedure(s) performed: None  Procedures  Critical Care performed: Yes, see critical care note(s)  CRITICAL CARE Performed by: Sharyn Creamer   Total critical care time: 30 minutes  Critical care time was exclusive of  separately billable procedures and treating other patients.  Critical care was necessary to treat or prevent imminent or life-threatening deterioration.  Critical care was time spent personally by me on the following activities: development of treatment plan with patient and/or surrogate as well as nursing, discussions with consultants, evaluation of patient's response to treatment, examination of patient, obtaining history from patient or surrogate, ordering and performing treatments and interventions, ordering and review of laboratory studies, ordering and review of radiographic studies, pulse oximetry and re-evaluation of patient's condition.  Large symptomatic abdominal aortic aneurysm felt to be at risk of imminent rupture.  Time life sensitive diagnosis ____________________________________________   INITIAL IMPRESSION / ASSESSMENT AND PLAN / ED COURSE  Pertinent labs & imaging results that were available during my care of the patient were reviewed by me and considered in my medical decision making (see chart for details).   Patient with a presentation concerning for impending rupture of a large abdominal aneurysm.  She does not have any symptoms of thoracic nature.  No chest pain.  She is hemodynamically stable just slightly hypertensive.  Fully awake and alert.  She does not appear to have any compromise of distal neurovascular.  Patient was emergently consulted with vascular surgery, Dr. Gilda Crease saw and evaluated her in the ER and is admitting the hospital for further care and management which I suspect will be operative in nature.  The patient had screening labs sent, some of which are still pending at the time of admission      ____________________________________________   FINAL CLINICAL IMPRESSION(S) / ED DIAGNOSES  Final diagnoses:  Abdominal aortic aneurysm (AAA) without rupture [facility])        Note:  This document was prepared using Dragon voice recognition software  and may include unintentional dictation errors       Sharyn Creamer, MD 06/12/20 1318

## 2020-06-12 NOTE — ED Notes (Signed)
Patient not in room when going to attempt more blood work. Stretcher and monitor gone from room. Report not given. Per chart patient moved to cath lab.

## 2020-06-12 NOTE — Progress Notes (Signed)
1000 LR given to patient

## 2020-06-12 NOTE — Op Note (Signed)
OPERATIVE NOTE   PROCEDURE: 1. US guidance for vascular access, bilateral femoral arteries 2. Catheter placement into aorta from bilateral femoral approaches 3. Placement of a 28 x 14 x 12 C3 conformable Gore Excluder Endoprosthesis main body with a 20 x 14 ipsilateral extender limb with a 16 x 14 contralateral limb 4. Placement of a 6 mm x 37 mm lifestream stent right renal artery using the snorkel technique. 5. ProGlide closure devices bilateral femoral arteries  PRE-OPERATIVE DIAGNOSIS: Ruptured AAA  POST-OPERATIVE DIAGNOSIS: same  SURGEON: Hortencia Pilar, MD and Leotis Pain, MD - Co-surgeons  ANESTHESIA: general  ESTIMATED BLOOD LOSS: 50 cc  FINDING(S): 1.  Ruptured 8 cm pararenal AAA  SPECIMEN(S):  none  INDICATIONS:   Susan Farrell is a 75 y.o. y.o. female who presents with a ruptured abdominal aortic aneurysm.  CT scan was obtained without contrast secondary to the national shortage but appeared to show a reasonable possibility for endovascular repair.  However it also appeared to be a pararenal aneurysm therefore we made preparations for arm access bilaterally so that we could snorkel 1 of both renal arteries in an attempt to get appropriate seal.  The risks and benefits for endovascular repair were reviewed with the patient's.  Open aneurysm repair was also discussed.  All questions were answered.  Patient and her husband agreed for [place] to proceed with endovascular repair.  DESCRIPTION: After obtaining full informed written consent, the patient was brought back to the operating room and placed supine upon the operating table.  The patient received IV antibiotics prior to induction.  After obtaining adequate anesthesia, the patient was prepped and draped in the standard fashion for endovascular AAA repair.  Co-surgeons are required because this is a complex bilateral procedure with work being performed simultaneously from both the right femoral and left femoral  approach.  This also expedites the procedure making a shorter operative time reducing complications and improving patient safety.  We then began by gaining access to both femoral arteries with US guidance with me working on the patient's right and Dr. Lucky Cowboy working on the patient's left.  The femoral arteries were found to be patent and accessed without difficulty with a needle under ultrasound guidance without difficulty on each side and permanent images were recorded.  We then placed 2 proglide devices on each side in a pre-close fashion and placed 8 French sheaths.  The patient was then given 5000 units of intravenous heparin.   The Pigtail catheter was placed into the aorta from the right side. Using this image, it demonstrated the aneurysm was in the pararenal the right renal artery was the lowest and there was a sharp angulation associated with the infrarenal aortic segment.  Based on these findings it became apparent that snorkeling the right renal artery would be mandatory.  At this point Dr. Lucky Cowboy returned the ultrasound in a sterile sleeve to the field and evaluated the left brachial artery.  It was echolucent pulsatile indicating patency.  Under direct visualization he was able to access the artery without difficulty J-wire followed by a 6 French sheath was then inserted.  A 035 advantage wire and Kumpe catheter were then used to negotiate the wire catheter combination down into the descending thoracic aorta and then into the visceral segment of the abdominal aorta.  The right renal artery was then cannulated without contrast.  The wire was then exchanged for a versa core wire and an 80 cm shuttle sheath was advanced over the  wire and positioned so that he was sitting approximately 2 to 3 cm into the renal artery.  Hand-injection of contrast was then performed verifying intraluminal positioning and that we were in the right renal artery.  Next a 6 mm x 37 mm lifestream stent was positioned so that it  was midway across the ostia of the renal artery.  Next we selected a 28 x 14 x 12 conformable C3 Main body device.  Over a stiff wire, an 76 French sheath was placed up the right side. The main body was then placed through the 18 French sheath. A pigtail catheter was placed up the left side and a magnified image at the renal arteries was performed. The main body was then deployed just below the left renal artery.  Follow-up imaging was then performed and the graft was read constrained and repositioned.  This was performed twice.  Once the graft was positioned at the level of the left renal artery Dr. Lucky Cowboy inflated the lifestream stent to 10 atm.  The pigtail catheter was used to cannulate the contralateral gate without difficulty and successful cannulation was confirmed by twirling the pigtail catheter in the main body. We then placed a stiff wire and a retrograde arteriogram was performed through the left femoral sheath. We upsized to the 12 [place] sheath for the contralateral limb and a 16 x 14 contralateral limb was selected and deployed. The main body deployment was then completed. Based off the angiographic findings, extension limbs were necessary on the right.  A 20 x 14 ipsilateral limb was then selected and deployed extending into the iliac. All junction points and seals zones were treated with the compliant balloon.  Once the proximal graft was treated with a Coda balloon the balloon was reinflated and the right renal stent to fully expand the stent.  The pigtail catheter was then replaced and a completion angiogram was performed.   No endoleak was detected on completion angiography. The renal arteries were found to be widely patent bilaterally including the stent.    At this point we elected to terminate the procedure. We secured the pro glide devices for hemostasis on the femoral arteries. The skin incision was closed with a 4-0 Monocryl. Dermabond and pressure dressing were placed.  The left  brachial artery was treated with a minx device and then pressure was held.  A small hematoma was noted after 10 minutes another 15 minutes of pressure was held at which point left arm was stable.  The patient was taken to the recovery room in stable condition having tolerated the procedure well.  COMPLICATIONS: none  CONDITION: stable  Hortencia Pilar  06/12/2020, 1:47 PM

## 2020-06-12 NOTE — Transfer of Care (Signed)
Immediate Anesthesia Transfer of Care Note  Patient: Susan Farrell  Procedure(s) Performed: ENDOVASCULAR REPAIR/STENT GRAFT (N/A )  Patient Location: PACU  Anesthesia Type:General  Level of Consciousness: awake, alert  and oriented  Airway & Oxygen Therapy: Patient Spontanous Breathing and Patient connected to face mask oxygen  Post-op Assessment: Report given to RN and Post -op Vital signs reviewed and stable  Post vital signs: Reviewed and stable  Last Vitals:  Vitals Value Taken Time  BP 138/81 06/12/20 1356  Temp    Pulse 87 06/12/20 1400  Resp 20 06/12/20 1400  SpO2 98 % 06/12/20 1400  Vitals shown include unvalidated device data.  Last Pain: There were no vitals filed for this visit.       Complications: No complications documented.

## 2020-06-12 NOTE — Plan of Care (Signed)
Care plan reviewed with pt.

## 2020-06-12 NOTE — Op Note (Signed)
OPERATIVE NOTE   PROCEDURE: 1. US guidance for vascular access, bilateral femoral arteries 2. Catheter placement into aorta from bilateral femoral approaches 3. Ultrasound guidance for vascular access left brachial artery 4. Catheter placement into right renal artery from left brachial approach with selective right renal angiogram 5. Placement of a right renal artery stent with a 6 mm diameter by 37 mm length lifestream stent 6. Placement of a 28 mm diameter proximal conformable Gore Excluder Endoprosthesis main body right with a 16 mm diameter by 14 cm length left contralateral limb 7. Placement of a 20 mm diameter by 12 cm length right iliac extension limb 8. ProGlide closure devices bilateral femoral arteries  PRE-OPERATIVE DIAGNOSIS: AAA, Ruptured  POST-OPERATIVE DIAGNOSIS: same  SURGEON: Leotis Pain, MD and Hortencia Pilar, MD - Co-surgeons  ANESTHESIA: General  ESTIMATED BLOOD LOSS: 50 cc  FINDING(S): 1.  AAA with contained rupture.  Short angulated neck requiring snorkel technique with stenting of the right renal artery.  SPECIMEN(S):  none  INDICATIONS:   Susan Farrell is a 75 y.o. 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 The anatomy was suitable for endovascular repair but would require snorkel technique of at least the right renal artery based off of the noncontrasted preoperative CT scan.  No contrast was used due to the national contrast shortage.  Risks and benefits of repair in an endovascular fashion were discussed and informed consent was obtained. Co-surgeons are used to expedite the procedure and reduce operative time as bilateral work needs to be done.  DESCRIPTION: After obtaining full informed written consent, the patient was brought back to the operating room and placed supine upon the operating table.  The patient received IV antibiotics prior to induction.  After obtaining adequate anesthesia, the  patient was prepped and draped in the standard fashion for endovascular AAA repair.  We then began by gaining access to both femoral arteries with US guidance with me working on the left and Dr. Delana Meyer working on the right.  The femoral arteries were found to be patent and accessed without difficulty with a needle under ultrasound guidance without difficulty on each side and permanent images were recorded.  We then placed 2 proglide devices on each side in a pre-close fashion and placed 8 French sheaths. The patient was then given 5000 units of intravenous heparin. The Pigtail catheter was placed into the aorta from the right side. Using this image, it was clear that we were going to have to snorkel the right renal artery due to the short angulated neck.  I then accessed the left brachial artery under direct ultrasound guidance without difficulty with a micropuncture needle and a permanent image was recorded.  A micropuncture wire and sheath were then placed.  We upsized to a 6 [place] sheath and then used the Kumpe catheter and the advantage wire to get down into the descending thoracic aorta and selectively cannulate the right renal artery without difficulty with the advantage wire and a Kumpe catheter.  Selective imaging of the right renal arteries performed showing only mild disease proximally with a reasonably tortuous proximal portion that we would stent beyond to make sure we had seal.  Over a versa core wire, we then placed a 6 French sheath from the left brachial artery into the right renal artery.  We would select a 6 mm diameter by 37 mm length lifestream stent and this was brought on the field but not deployed until the  main body deployment was completed.  We selected a 28 mm diameter by 12 cm length conformable Gore Excluder Main body device.  Over a stiff wire, an 85 French sheath was placed. The main body was then placed through the 18 French sheath. A Kumpe catheter was placed up the right side and a  magnified image at the renal arteries was performed. The main body was then deployed just below the left renal artery.  The lifestream stent was then deployed in the right renal artery with about half of the stent in the renal artery and about 2 cm of the stent back up into the aorta just above the main body.  The Glidewire and pigtail catheter was used to cannulate the contralateral gate without difficulty and successful cannulation was confirmed by twirling the pigtail catheter in the main body. We then placed a stiff wire and a retrograde arteriogram was performed through the left femoral sheath. We upsized to the 12 [place] sheath for the contralateral limb and a 16 mm diameter by 14 cm length left iliac limb was selected and deployed down to just above the left hypogastric artery. The main body deployment was then completed. Based off the angiographic findings, extension limbs were necessary.  A 20 mm diameter by 12 cm length right iliac extension limb was taken down to just above the right hypogastric artery after retrograde imaging was performed to the right femoral sheath and LAO projection.  All junction points and seals zones were treated with the compliant balloon.  The lifestream stent in the right renal artery was then reinflated to 6 atm after the compliant balloon had been used in the proximal aorta with the lifestream balloon deflated.  The pigtail catheter was then replaced and a completion angiogram was performed.  No obvious endoleak was detected on completion angiography. The renal arteries were found to be widely patent.  This included a patent stent in the right renal artery and a snorkel technique.  Both hypogastric arteries were also widely patent.  There was no extravasation of blood or hemorrhage after stent graft repair of this ruptured abdominal aortic aneurysm. At this point we elected to terminate the procedure. We secured the pro glide devices for hemostasis on the femoral arteries. The  skin incision was closed with a 4-0 Monocryl. Dermabond and pressure dressing were placed.  A minx closure device was used in the left brachial artery and 15 minutes of pressure were held in the left brachial artery as well.  The patient was taken to the recovery room in stable condition having tolerated the procedure well.  COMPLICATIONS: none  CONDITION: stable  Leotis Pain  06/12/2020, 1:34 PM   This note was created with Dragon Medical transcription system. Any errors in dictation are purely unintentional.

## 2020-06-12 NOTE — Interval H&P Note (Signed)
History and Physical Interval Note:  06/12/2020 10:40 AM  Susan Farrell  has presented today for surgery, with the diagnosis of Ruptured AAA.  The various methods of treatment have been discussed with the patient and family. After consideration of risks, benefits and other options for treatment, the patient has consented to  Procedure(s): ENDOVASCULAR REPAIR/STENT GRAFT (N/A) as a surgical intervention.  The patient's history has been reviewed, patient examined, no change in status, stable for surgery.  I have reviewed the patient's chart and labs.  Questions were answered to the patient's satisfaction.     Levora Dredge

## 2020-06-12 NOTE — H&P (Signed)
@LOGO @   MRN :  Susan Farrell is a 75 y.o. (1945/10/19) female who presents with chief complaint of  Chief Complaint  Patient presents with  . Abdominal Pain    Leaking AAA  .  History of Present Illness:   The patient presents to the [facility] emergency department for evaluation of a ruptured abdominal aortic aneurysm. The aneurysm was found incidentally by CT scan.  She was seen on May 17 for evaluation of abdominal pain that been present for approximately 1 month.  Concerns were raised for acute diverticulitis and a CT scan without contrast was ordered.  Scan was obtained today and upon review the patient was contacted and told to go to the emergency room urgently.  Patient describes left lower quadrant abdominal pain has been present for approximately 1 month.   No unusual back pain, no other abdominal complaints such as nausea vomiting or diarrhea.  No history of an acute onset of painful blue discoloration of the toes.     No family history of AAA.   Patient denies amaurosis fugax or TIA symptoms. There is no history of claudication or rest pain symptoms of the lower extremities.  The patient denies angina or shortness of breath.  CT scan shows an AAA that measures 8.00 cm, it is impossible to determine whether this is juxtarenal or infrarenal based on this noncontrasted scan.  No outpatient medications have been marked as taking for the 06/12/20 encounter [facility] Encounter).    History reviewed. No pertinent past medical history.  Past Surgical History:  Procedure Laterality Date  . ABDOMINAL HYSTERECTOMY    . CHOLECYSTECTOMY      Social History Social History   Tobacco Use  . Smoking status: Current Every Day Smoker    Types: Cigarettes  . Smokeless tobacco: Current User    Family History No family history on file.  No Known Allergies   REVIEW OF SYSTEMS (Negative unless checked)  Constitutional: [] Weight loss  [] Fever   [] Chills Cardiac: [] Chest pain   [] Chest pressure   [] Palpitations   [] Shortness of breath when laying flat   [] Shortness of breath with exertion. Vascular:  [] Pain in legs with walking   [] Pain in legs at rest  [] History of DVT   [] Phlebitis   [] Swelling in legs   [] Varicose veins   [] Non-healing ulcers Pulmonary:   [] Uses home oxygen   [] Productive cough   [] Hemoptysis   [] Wheeze  [] COPD   [] Asthma Neurologic:  [] Dizziness   [] Seizures   [] History of stroke   [] History of TIA  [] Aphasia   [] Vissual changes   [] Weakness or numbness in arm   [] Weakness or numbness in leg Musculoskeletal:   [] Joint swelling   [] Joint pain   [] Low back pain Hematologic:  [] Easy bruising  [] Easy bleeding   [] Hypercoagulable state   [] Anemic Gastrointestinal:  [] Diarrhea   [] Vomiting  [] Gastroesophageal reflux/heartburn   [] Difficulty swallowing. Genitourinary:  [] Chronic kidney disease   [] Difficult urination  [] Frequent urination   [] Blood in urine Skin:  [] Rashes   [] Ulcers  Psychological:  [] History of anxiety   []  History of major depression.  Physical Examination  Vitals:   06/12/20 0954 06/12/20 0958  BP:  (!) 162/85  Pulse:  87  Resp:  14  Temp:  98 F (36.7 C)  SpO2:  100%  Weight: 68.5 kg   Height: 5\' 7"  (1.702 m)    Body mass index is 23.65 kg/m. Gen: WD/WN, NAD Head: [place]/AT, No temporalis  wasting.  Ear/Nose/Throat: Hearing grossly intact, nares w/o erythema or drainage Eyes: PER, EOMI, sclera nonicteric.  Neck: Supple, no large masses.   Pulmonary:  Good air movement, no audible wheezing bilaterally, no use of accessory muscles.  Cardiac: RRR, no JVD Vascular: Large tender pulsatile abdominal mass consistent with an abdominal aortic aneurysm Vessel Right Left  Radial Palpable Palpable  PT Palpable Palpable  DP Palpable Palpable  Gastrointestinal: Non-distended. No guarding/no peritoneal signs.  Musculoskeletal: M/S 5/5 throughout.  No deformity or atrophy.  Neurologic: CN 2-12 intact.  Symmetrical.  Speech is fluent. Motor exam as listed above. Psychiatric: Judgment intact, Mood & affect appropriate for pt's clinical situation. Dermatologic: No rashes or ulcers noted.  No changes consistent with cellulitis.   CBC Lab Results  Component Value Date   WBC 6.7 02/10/2012   HGB 12.4 02/10/2012   HCT 39.0 02/10/2012   MCV 78 (L) 02/10/2012   PLT 246 02/10/2012    BMET    Component Value Date/Time   NA 142 02/10/2012 0812   CL 110 (H) 02/10/2012 0812   CO2 29 02/10/2012 0812   GLUCOSE 86 02/10/2012 0812   CALCIUM 9.0 02/10/2012 0812   CrCl cannot be calculated (No successful lab value found.).  COAG No results found for: INR, PROTIME  Radiology: I have personally reviewed the scan and agree with these findings  CT ABDOMEN PELVIS WO CONTRAST  Result Date: 06/12/2020 CLINICAL DATA:  Left lower quadrant pain for 3 months. EXAM: CT ABDOMEN AND PELVIS WITHOUT CONTRAST TECHNIQUE: Multidetector CT imaging of the abdomen and pelvis was performed following the standard protocol without IV contrast. COMPARISON:  Clinic note of 06/05/2020.  Prior CT 08/26/2004. FINDINGS: Lower chest: Mild centrilobular emphysema. Normal heart size without pericardial or pleural effusion. Tiny hiatal hernia. Hepatobiliary: Subtle hypoattenuating segment 3 liver mass including at 5.0 x 3.9 cm on 19/2. 2.8 cm lesion in this segment in 2006. Cholecystectomy, without biliary ductal dilatation. Pancreas: Normal, without mass or ductal dilatation. Spleen: Normal in size, without focal abnormality. Adrenals/Urinary Tract: Normal right adrenal gland. Left adrenal thickening with maintenance of adreniform shape. Low-density, suggesting an underlying adenoma. No renal calculi or hydronephrosis. Exophytic interpolar left renal low-density lesion is likely a cyst at 1.0 cm. No bladder calculi. Stomach/Bowel: Normal stomach, without wall thickening. Scattered colonic diverticula. Normal terminal ileum and  appendix. Normal small bowel. Vascular/Lymphatic: Aortic atherosclerosis. Beginning at approximately level of the renal arteries, but suboptimally evaluated secondary to noncontrast technique, is aortic dilatation. On the order of maximally 8.2 x 6.4 cm including on 23/2. This encompasses a focal outpouching at the 2 o'clock position. Periaortic interstitial thickening on 36/2. No extension into the iliacs. The right common iliac is mildly prominent at 1.4 cm. No abdominopelvic adenopathy. Reproductive: Hysterectomy.  No adnexal mass. Other: No significant free fluid. Moderate pelvic floor laxity. No free intraperitoneal air. Musculoskeletal: No acute osseous abnormality. IMPRESSION: 1. Abdominal aortic aneurysm up to 8.2 cm with surrounding periaortic interstitial thickening, indicative of impending rupture. Recommend referral to a vascular specialist. This recommendation follows ACR consensus guidelines: White Paper of the ACR Incidental Findings Committee II on Vascular Findings. J Am Coll Radiol 2013; 10:789-794. The patient is being held in the imaging center and report will be called. 2. Left hepatic lobe mass, enlarged compared to 2006. Favor benign entity such as a hemangioma. This will be better evaluated on follow-up contrast enhanced aortic imaging. 3.  Tiny hiatal hernia. 4. Aortic atherosclerosis (ICD10-I70.0) and emphysema (ICD10-J43.9). These results will  be called to the ordering clinician or representative by the Radiologist Assistant, and communication documented in the PACS or [employer]. Electronically Signed   By: Jeronimo Greaves M.D.   On: 06/12/2020 09:17      Assessment/Plan 1.  Ruptured abdominal aortic aneurysm: The patient requires emergent repair of her abdominal aortic aneurysm because it is ruptured.  It appears that we will be able to use a stent graft however I am uncertain as to whether we will need to snorkel the renal arteries or even whether her renal arteries will be  salvaged.  I discussed this with the patient and her husband was present.  I described complications including but not exclusively: renal failure, clotting, infection, heart attack, pneumonia and death.  All questions were answered patient is in agreement with proceeding with repair.  Hypothyroidism: Continue hormone replacement as ordered and reviewed, no changes at this time.  Hypertension: We will treat her hypertension with antihypertensive medications as needed.  Blood pressure will be monitored throughout the procedure.    Levora Dredge, MD  06/12/2020 10:28 AM

## 2020-06-13 ENCOUNTER — Encounter: Payer: Self-pay | Admitting: Vascular Surgery

## 2020-06-13 LAB — CBC
HCT: 25.9 % — ABNORMAL LOW (ref 36.0–46.0)
Hemoglobin: 9 g/dL — ABNORMAL LOW (ref 12.0–15.0)
MCH: 25.3 pg — ABNORMAL LOW (ref 26.0–34.0)
MCHC: 34.7 g/dL (ref 30.0–36.0)
MCV: 72.8 fL — ABNORMAL LOW (ref 80.0–100.0)
Platelets: 131 10*3/uL — ABNORMAL LOW (ref 150–400)
RBC: 3.56 MIL/uL — ABNORMAL LOW (ref 3.87–5.11)
RDW: 16.7 % — ABNORMAL HIGH (ref 11.5–15.5)
WBC: 7 10*3/uL (ref 4.0–10.5)
nRBC: 0 % (ref 0.0–0.2)

## 2020-06-13 LAB — BASIC METABOLIC PANEL
Anion gap: 8 (ref 5–15)
BUN: 9 mg/dL (ref 8–23)
CO2: 22 mmol/L (ref 22–32)
Calcium: 8.3 mg/dL — ABNORMAL LOW (ref 8.9–10.3)
Chloride: 109 mmol/L (ref 98–111)
Creatinine, Ser: 0.62 mg/dL (ref 0.44–1.00)
GFR, Estimated: >60 mL/min (ref >60)
Glucose, Bld: 120 mg/dL — ABNORMAL HIGH (ref 70–99)
Potassium: 3.4 mmol/L — ABNORMAL LOW (ref 3.5–5.1)
Sodium: 139 mmol/L (ref 135–145)

## 2020-06-13 LAB — PREPARE RBC (CROSSMATCH)

## 2020-06-13 NOTE — Progress Notes (Signed)
Received patient in 217 from ICU. Patient states left arm pain was 8/10 at brachial insertion site, sudden onset a few hours ago after removing bandage/board. describes it as a soreness. It is hot, edematous, and discolored around insertion site. She does have good pulses and warm hands. It came down to 5/10 with oxycodone. Hurts a lot to bend arm. Just received patient on med-surg, want to be sure you are aware.

## 2020-06-13 NOTE — Progress Notes (Signed)
Received patient in 217 from ICU. Patient states left arm pain was 8/10 at brachial insertion site with sudden onset a few hours ago after removing bandage/board. describes it as a soreness. It is hot, edematous, and dark/discolored around insertion site. She does have good pulses and warm hands. Pain reduced to 5/10 with oxycodone. Worse with bending arm, improves when arm is straight/elevated, improves with oxycodone. Secure chat sent to Dr. Gilda Crease with assessment information.  No response, paged Dr. Gilda Crease [phone]. Returned call [phone]. Dr. Gilda Crease updated on patient he states he will be on the floor shortly to see patient.

## 2020-06-13 NOTE — Progress Notes (Signed)
Met patient and husband bedside. Patient states she if feeling well and can't wait to get out of here. She also states she is considered a miracle. Patient shared faith and gratitude to God and staff for saving her.

## 2020-06-13 NOTE — Anesthesia Postprocedure Evaluation (Signed)
Anesthesia Post Note  Patient: Susan Farrell  Procedure(s) Performed: ENDOVASCULAR REPAIR/STENT GRAFT (N/A )  Patient location during evaluation: ICU Anesthesia Type: General Level of consciousness: awake Pain management: pain level controlled Vital Signs Assessment: post-procedure vital signs reviewed and stable Respiratory status: spontaneous breathing and respiratory function stable Cardiovascular status: stable Postop Assessment: no apparent nausea or vomiting Anesthetic complications: no   No complications documented.   Last Vitals:  Vitals:   06/13/20 0400 06/13/20 0800  BP: 131/72   Pulse: 65   Resp: 19   Temp: 36.9 C 36.9 C  SpO2: 99%     Last Pain:  Vitals:   06/13/20 0800  TempSrc: Oral  PainSc: 2                  Rica Mast

## 2020-06-13 NOTE — Progress Notes (Signed)
Attending MD Dr. Gilda Crease notified of temp 101.1 via secure chat, copied NOC nurse Jodie to conversation

## 2020-06-13 NOTE — Progress Notes (Signed)
Pt transferring to room 217 and report called.

## 2020-06-13 NOTE — Progress Notes (Signed)
[facility]  Daily Progress Note   Subjective  -   Patient sitting up in bed alert using her cell phone she denies pain.  Objective Vitals:   06/12/20 2000 06/13/20 0000 06/13/20 0400 06/13/20 0800  BP: 127/72 128/64 131/72   Pulse: 65 65 65   Resp: 19 20 19    Temp: 98.4 F (36.9 C) 98.6 F (37 C) 98.4 F (36.9 C) 98.4 F (36.9 C)  TempSrc: Oral Oral Oral Oral  SpO2: 100% 100% 99%   Weight:      Height:        Intake/Output Summary (Last 24 hours) at 06/13/2020 0810 Last data filed at 06/13/2020 0400 Gross per 24 hour  Intake 1560 ml  Output 1790 ml  Net -230 ml    PULM  CTAB CV  RRR VASC  both groins clean dry and intact no evidence of hematoma safeguard still in place, left arm small hematoma palpable radial pulse.  2+ dorsalis pedis and posterior tibial pulses noted.  Laboratory CBC    Component Value Date/Time   WBC 7.0 06/13/2020 0505   HGB 9.0 (L) 06/13/2020 0505   HGB 12.4 02/10/2012 0812   HCT 25.9 (L) 06/13/2020 0505   HCT 39.0 02/10/2012 0812   PLT 131 (L) 06/13/2020 0505   PLT 246 02/10/2012 0812    BMET    Component Value Date/Time   NA 139 06/13/2020 0505   NA 142 02/10/2012 0812   K 3.4 (L) 06/13/2020 0505   CL 109 06/13/2020 0505   CL 110 (H) 02/10/2012 0812   CO2 22 06/13/2020 0505   CO2 29 02/10/2012 0812   GLUCOSE 120 (H) 06/13/2020 0505   GLUCOSE 86 02/10/2012 0812   BUN 9 06/13/2020 0505   CREATININE 0.62 06/13/2020 0505   CALCIUM 8.3 (L) 06/13/2020 0505   CALCIUM 9.0 02/10/2012 0812   GFRNONAA >60 06/13/2020 0505    Assessment/Planning: POD #1 s/p endovascular repair of ruptured abdominal aortic aneurysm   Advance diet to regular  Transfer to regular floor  Increase activities ambulate as tolerated  06/15/2020  06/13/2020, 8:10 AM

## 2020-06-14 ENCOUNTER — Encounter: Payer: Self-pay | Admitting: Vascular Surgery

## 2020-06-14 MED ORDER — HYDROCODONE-ACETAMINOPHEN 5-325 MG PO TABS: 1.0000 | ORAL_TABLET | Freq: Four times a day (QID) | ORAL | 0 refills | Status: DC | PRN

## 2020-06-14 NOTE — Discharge Summary (Signed)
[facility]    Discharge Summary    Patient ID:  Susan Farrell MRN: [id number] DOB/AGE: [birth year] 74 y.o.  Admit date: 06/12/2020 Discharge date: 06/14/2020 Date of Surgery: 06/12/2020 Surgeon: Surgeon(s): Ruthvik Barnaby, Latina Craver, MD Annice Needy, MD  Admission Diagnosis: Abdominal aortic aneurysm (AAA) without rupture [facility]) [I71.4] Ruptured abdominal aortic aneurysm (AAA) [facility]) [I71.3]  Discharge Diagnoses:  Abdominal aortic aneurysm (AAA) without rupture (H[facility]) [I71.4] Ruptured abdominal aortic aneurysm (AAA) (HCC) [I71.3]  Secondary Diagnoses: History reviewed. No pertinent past medical history.  Procedure(s): ENDOVASCULAR REPAIR/STENT GRAFT  Discharged Condition: good  HPI:  Patient presented to the emergency room with abdominal pain CT scan demonstrated ruptured abdominal aortic aneurysm.  Patient subsequently was taken emergently to special procedures where endovascular repair of her abdominal aortic aneurysm was performed.  Secondary to a short proximal neck stenting of the right renal artery with a snorkel technique was needed.  Postoperatively she had a low-grade temperature but otherwise has done well and she is felt fit for discharge.  She is tolerating a regular diet and ambulating without difficulty.  She states her pain is not too bad.  Hospital Course:  Susan Farrell is a 75 y.o. female is S/P Neither Procedure(s): ENDOVASCULAR REPAIR/STENT GRAFT Extubated: POD # 0 Physical exam: Palpable DP and posterior tibial pulses bilaterally left arm has a 2+ radial pulse.  There is a small hematoma just above the antecubital fossa noted Post-op wounds clean, dry, intact or healing well Pt. Ambulating, voiding and taking PO diet without difficulty. Pt pain controlled with PO pain meds. Labs as below Complications:none  Consults:    Significant Diagnostic Studies: CBC Lab Results  Component Value Date   WBC 7.0 06/13/2020    HGB 9.0 (L) 06/13/2020   HCT 25.9 (L) 06/13/2020   MCV 72.8 (L) 06/13/2020   PLT 131 (L) 06/13/2020    BMET    Component Value Date/Time   NA 139 06/13/2020 0505   NA 142 02/10/2012 0812   K 3.4 (L) 06/13/2020 0505   CL 109 06/13/2020 0505   CL 110 (H) 02/10/2012 0812   CO2 22 06/13/2020 0505   CO2 29 02/10/2012 0812   GLUCOSE 120 (H) 06/13/2020 0505   GLUCOSE 86 02/10/2012 0812   BUN 9 06/13/2020 0505   CREATININE 0.62 06/13/2020 0505   CALCIUM 8.3 (L) 06/13/2020 0505   CALCIUM 9.0 02/10/2012 0812   GFRNONAA >60 06/13/2020 0505   COAG Lab Results  Component Value Date   INR 1.2 06/12/2020     Disposition:  Discharge to :Home Discharge Instructions    Call MD for:  redness, tenderness, or signs of infection (pain, swelling, bleeding, redness, odor or green/yellow discharge around incision site)   Complete by: As directed    Call MD for:  severe or increased pain, loss or decreased feeling  in affected limb(s)   Complete by: As directed    Call MD for:  temperature >100.5   Complete by: As directed    Discharge instructions   Complete by: As directed    OK to shower   Driving Restrictions   Complete by: As directed    No driving for one week   Lifting restrictions   Complete by: As directed    No lifting for one week   No dressing needed   Complete by: As directed    Replace only if drainage present   Resume previous diet   Complete by: As directed  Verbal and written Discharge instructions given to the patient. Wound care per Discharge AVS  Follow-up Information    Bowe Sidor, Latina Craver, MD Follow up in 2 week(s).   Specialties: Vascular Surgery, Cardiology, Radiology, Vascular Surgery Why: duplex ultraousnd of the AAA Contact information: [address] [place] [address] [phone]               Signed: Levora Dredge, MD  06/14/2020, 5:56 PM

## 2020-06-15 LAB — BPAM RBC
Blood Product Expiration Date: 202206132359
Blood Product Expiration Date: 202206132359
Blood Product Expiration Date: 202206212359
Blood Product Expiration Date: 202206212359
ISSUE DATE / TIME: 202205241252
ISSUE DATE / TIME: 202205260931
Unit Type and Rh: 6200
Unit Type and Rh: 6200
Unit Type and Rh: 6200
Unit Type and Rh: 6200

## 2020-06-15 LAB — TYPE AND SCREEN
ABO/RH(D): A POS
Antibody Screen: POSITIVE
Unit division: 0
Unit division: 0
Unit division: 0
Unit division: 0

## 2020-06-22 DIAGNOSIS — D649 Anemia, unspecified: Secondary | ICD-10-CM | POA: Diagnosis not present

## 2020-06-22 DIAGNOSIS — M8580 Other specified disorders of bone density and structure, unspecified site: Secondary | ICD-10-CM | POA: Diagnosis not present

## 2020-06-22 DIAGNOSIS — Z09 Encounter for follow-up examination after completed treatment for conditions other than malignant neoplasm: Secondary | ICD-10-CM | POA: Diagnosis not present

## 2020-06-22 DIAGNOSIS — I10 Essential (primary) hypertension: Secondary | ICD-10-CM | POA: Diagnosis not present

## 2020-06-22 DIAGNOSIS — E049 Nontoxic goiter, unspecified: Secondary | ICD-10-CM | POA: Diagnosis not present

## 2020-06-22 DIAGNOSIS — Z8679 Personal history of other diseases of the circulatory system: Secondary | ICD-10-CM | POA: Diagnosis not present

## 2020-06-22 DIAGNOSIS — Z9889 Other specified postprocedural states: Secondary | ICD-10-CM | POA: Diagnosis not present

## 2020-06-25 ENCOUNTER — Ambulatory Visit (INDEPENDENT_AMBULATORY_CARE_PROVIDER_SITE_OTHER): Payer: Medicare HMO

## 2020-06-25 ENCOUNTER — Other Ambulatory Visit (INDEPENDENT_AMBULATORY_CARE_PROVIDER_SITE_OTHER): Payer: Self-pay | Admitting: Vascular Surgery

## 2020-06-25 ENCOUNTER — Other Ambulatory Visit: Payer: Self-pay

## 2020-06-25 ENCOUNTER — Encounter (INDEPENDENT_AMBULATORY_CARE_PROVIDER_SITE_OTHER): Payer: Self-pay | Admitting: Vascular Surgery

## 2020-06-25 ENCOUNTER — Ambulatory Visit (INDEPENDENT_AMBULATORY_CARE_PROVIDER_SITE_OTHER): Payer: Medicare HMO | Admitting: Vascular Surgery

## 2020-06-25 VITALS — BP 154/72 | HR 80 | Ht 66.0 in | Wt 149.0 lb

## 2020-06-25 DIAGNOSIS — I713 Abdominal aortic aneurysm, ruptured, unspecified: Secondary | ICD-10-CM

## 2020-06-25 DIAGNOSIS — I714 Abdominal aortic aneurysm, without rupture, unspecified: Secondary | ICD-10-CM

## 2020-06-25 DIAGNOSIS — Z9889 Other specified postprocedural states: Secondary | ICD-10-CM

## 2020-06-25 DIAGNOSIS — Z8679 Personal history of other diseases of the circulatory system: Secondary | ICD-10-CM | POA: Diagnosis not present

## 2020-06-25 NOTE — Progress Notes (Signed)
    Patient ID: Susan Farrell, female   DOB: May 26, 1945, 75 y.o.   MRN: [id number]  Chief Complaint  Patient presents with  . Follow-up    [facility] 2 week F/U rupture  AAA. U/S     HPI Susan Farrell is a 75 y.o. female.   She is s/p endovascular repair of a ruptured AAA on 06/12/2020.  Patient states she is doing really well no claudication symptoms or leg pains groins are pain-free.  She denies abdominal pain and is tolerating regular diet.  She does note the bruising of the left arm was fairly extensive but it is going away and she still has a lump in the antecubital crease at the site of the puncture.   No past medical history on file.  Past Surgical History:  Procedure Laterality Date  . ABDOMINAL HYSTERECTOMY    . CHOLECYSTECTOMY    . ENDOVASCULAR REPAIR/STENT GRAFT N/A 06/12/2020   Procedure: ENDOVASCULAR REPAIR/STENT GRAFT;  Surgeon: Renford Dills, MD;  Location: [facility] INVASIVE CV [facility];  Service: Cardiovascular;  Laterality: N/A;      No Known Allergies  Current Outpatient Medications  Medication Sig Dispense Refill  . Calcium Carbonate-Vitamin D 600-200 MG-UNIT TABS Take 1 tablet by mouth daily.    Marland Kitchen HYDROcodone-acetaminophen (NORCO) 5-325 MG tablet Take 1-2 tablets by mouth every 6 (six) hours as needed for moderate pain or severe pain. 30 tablet 0  . Iron Combinations (NIFEREX) TABS Take by mouth.    . metoprolol succinate (TOPROL-XL) 25 MG 24 hr tablet Take 1 tablet by mouth daily.    . Multiple Vitamins-Minerals (WOMENS MULTIVITAMIN + COLLAGEN PO) Take 1 tablet by mouth daily.    Marland Kitchen SYNTHROID 100 MCG tablet Take 100 mcg by mouth daily.     No current facility-administered medications for this visit.        Physical Exam BP (!) 154/72   Pulse 80   Ht 5\' 6"  (1.676 m)   Wt 149 lb (67.6 kg)   BMI 24.05 kg/m  Gen:  WD/WN, NAD Skin: Groin incision C/D/I; left arm shows a 2 cm hematoma in the antecubital fossa.  There is no bruit there is no  pulsatility.  Ecchymoses is resolving and there is a 2+ radial pulse     Assessment/Plan: 1. Ruptured abdominal aortic aneurysm (AAA) (HCC) Recommend: Patient is status post successful endovascular repair of the AAA.   No further intervention is required at this time.   No endoleak is detected and the aneurysm sac is stable.  The patient will continue antiplatelet therapy as prescribed as well as aggressive management of hyperlipidemia. Exercise is again strongly encouraged.   However, endografts require continued surveillance with ultrasound or CT scan. This is mandatory to detect any changes that allow repressurization of the aneurysm sac.  The patient is informed that this would be asymptomatic.  The patient is reminded that lifelong routine surveillance is a necessity with an endograft. Patient will continue to follow-up at 6 month intervals with ultrasound of the aorta.  - VAS AORTA/IVC/ILIACS; Future       [place] 06/25/2020, 8:35 AM   This note was created with Dragon medical transcription system.  Any errors from dictation are unintentional.

## 2020-06-26 ENCOUNTER — Encounter (INDEPENDENT_AMBULATORY_CARE_PROVIDER_SITE_OTHER): Payer: Self-pay | Admitting: Vascular Surgery

## 2020-07-02 ENCOUNTER — Ambulatory Visit (INDEPENDENT_AMBULATORY_CARE_PROVIDER_SITE_OTHER): Payer: Self-pay | Admitting: Vascular Surgery

## 2020-07-02 ENCOUNTER — Other Ambulatory Visit (INDEPENDENT_AMBULATORY_CARE_PROVIDER_SITE_OTHER): Payer: Self-pay

## 2020-07-02 ENCOUNTER — Other Ambulatory Visit
Admission: RE | Admit: 2020-07-02 | Discharge: 2020-07-02 | Disposition: A | Discharge status: Home / Self Care | Payer: Medicare HMO | Attending: Otolaryngology | Admitting: Otolaryngology

## 2020-07-02 DIAGNOSIS — E041 Nontoxic single thyroid nodule: Secondary | ICD-10-CM | POA: Insufficient documentation

## 2020-07-02 LAB — TSH: TSH: 1.588 u[IU]/mL (ref 0.350–4.500)

## 2020-07-02 LAB — T4, FREE: Free T4: 1.22 ng/dL — ABNORMAL HIGH (ref 0.61–1.12)

## 2020-07-03 DIAGNOSIS — E041 Nontoxic single thyroid nodule: Secondary | ICD-10-CM | POA: Diagnosis not present

## 2020-07-03 DIAGNOSIS — H6121 Impacted cerumen, right ear: Secondary | ICD-10-CM | POA: Diagnosis not present

## 2020-07-03 LAB — T3, FREE: T3, Free: 2.3 pg/mL (ref 2.0–4.4)

## 2020-09-14 DIAGNOSIS — D649 Anemia, unspecified: Secondary | ICD-10-CM | POA: Diagnosis not present

## 2020-09-14 DIAGNOSIS — E039 Hypothyroidism, unspecified: Secondary | ICD-10-CM | POA: Diagnosis not present

## 2020-09-14 DIAGNOSIS — R7303 Prediabetes: Secondary | ICD-10-CM | POA: Diagnosis not present

## 2020-09-14 DIAGNOSIS — I10 Essential (primary) hypertension: Secondary | ICD-10-CM | POA: Diagnosis not present

## 2020-09-21 ENCOUNTER — Other Ambulatory Visit: Payer: Self-pay | Admitting: Internal Medicine

## 2020-09-21 DIAGNOSIS — E7800 Pure hypercholesterolemia, unspecified: Secondary | ICD-10-CM | POA: Diagnosis not present

## 2020-09-21 DIAGNOSIS — Z1231 Encounter for screening mammogram for malignant neoplasm of breast: Secondary | ICD-10-CM

## 2020-09-21 DIAGNOSIS — Z720 Tobacco use: Secondary | ICD-10-CM | POA: Diagnosis not present

## 2020-09-21 DIAGNOSIS — I10 Essential (primary) hypertension: Secondary | ICD-10-CM | POA: Diagnosis not present

## 2020-09-21 DIAGNOSIS — Z0000 Encounter for general adult medical examination without abnormal findings: Secondary | ICD-10-CM | POA: Diagnosis not present

## 2020-09-21 DIAGNOSIS — Z95828 Presence of other vascular implants and grafts: Secondary | ICD-10-CM | POA: Diagnosis not present

## 2020-09-21 DIAGNOSIS — M8580 Other specified disorders of bone density and structure, unspecified site: Secondary | ICD-10-CM | POA: Diagnosis not present

## 2020-09-21 DIAGNOSIS — E049 Nontoxic goiter, unspecified: Secondary | ICD-10-CM | POA: Diagnosis not present

## 2020-09-21 DIAGNOSIS — I714 Abdominal aortic aneurysm, without rupture: Secondary | ICD-10-CM | POA: Diagnosis not present

## 2020-10-17 ENCOUNTER — Other Ambulatory Visit: Payer: Self-pay

## 2020-10-17 ENCOUNTER — Ambulatory Visit
Admission: RE | Admit: 2020-10-17 | Discharge: 2020-10-17 | Disposition: A | Discharge status: Home / Self Care | Payer: Medicare HMO | Source: Ambulatory Visit | Attending: Internal Medicine | Admitting: Internal Medicine

## 2020-10-17 DIAGNOSIS — Z1231 Encounter for screening mammogram for malignant neoplasm of breast: Secondary | ICD-10-CM | POA: Insufficient documentation

## 2020-12-11 DIAGNOSIS — H524 Presbyopia: Secondary | ICD-10-CM | POA: Diagnosis not present

## 2020-12-11 DIAGNOSIS — H25813 Combined forms of age-related cataract, bilateral: Secondary | ICD-10-CM | POA: Diagnosis not present

## 2020-12-24 DIAGNOSIS — E041 Nontoxic single thyroid nodule: Secondary | ICD-10-CM | POA: Diagnosis not present

## 2021-01-07 DIAGNOSIS — E041 Nontoxic single thyroid nodule: Secondary | ICD-10-CM | POA: Diagnosis not present

## 2021-04-17 DIAGNOSIS — I714 Abdominal aortic aneurysm, without rupture, unspecified: Secondary | ICD-10-CM | POA: Diagnosis not present

## 2021-04-17 DIAGNOSIS — Z95828 Presence of other vascular implants and grafts: Secondary | ICD-10-CM | POA: Diagnosis not present

## 2021-04-17 DIAGNOSIS — E049 Nontoxic goiter, unspecified: Secondary | ICD-10-CM | POA: Diagnosis not present

## 2021-04-17 DIAGNOSIS — M8580 Other specified disorders of bone density and structure, unspecified site: Secondary | ICD-10-CM | POA: Diagnosis not present

## 2021-04-17 DIAGNOSIS — I10 Essential (primary) hypertension: Secondary | ICD-10-CM | POA: Diagnosis not present

## 2021-04-17 DIAGNOSIS — Z0000 Encounter for general adult medical examination without abnormal findings: Secondary | ICD-10-CM | POA: Diagnosis not present

## 2021-04-17 DIAGNOSIS — Z720 Tobacco use: Secondary | ICD-10-CM | POA: Diagnosis not present

## 2021-04-24 DIAGNOSIS — Z1389 Encounter for screening for other disorder: Secondary | ICD-10-CM | POA: Diagnosis not present

## 2021-04-24 DIAGNOSIS — D649 Anemia, unspecified: Secondary | ICD-10-CM | POA: Diagnosis not present

## 2021-04-24 DIAGNOSIS — I10 Essential (primary) hypertension: Secondary | ICD-10-CM | POA: Diagnosis not present

## 2021-04-24 DIAGNOSIS — Z0000 Encounter for general adult medical examination without abnormal findings: Secondary | ICD-10-CM | POA: Diagnosis not present

## 2021-04-24 DIAGNOSIS — F17210 Nicotine dependence, cigarettes, uncomplicated: Secondary | ICD-10-CM | POA: Diagnosis not present

## 2021-04-24 DIAGNOSIS — Z95828 Presence of other vascular implants and grafts: Secondary | ICD-10-CM | POA: Diagnosis not present

## 2021-04-25 ENCOUNTER — Other Ambulatory Visit: Payer: Self-pay | Admitting: Internal Medicine

## 2021-04-25 DIAGNOSIS — Z95828 Presence of other vascular implants and grafts: Secondary | ICD-10-CM

## 2021-04-25 DIAGNOSIS — I10 Essential (primary) hypertension: Secondary | ICD-10-CM

## 2021-05-02 ENCOUNTER — Ambulatory Visit
Admission: RE | Admit: 2021-05-02 | Discharge: 2021-05-02 | Disposition: A | Discharge status: Home / Self Care | Payer: Medicare HMO | Source: Ambulatory Visit | Attending: Internal Medicine | Admitting: Internal Medicine

## 2021-05-02 DIAGNOSIS — Z95828 Presence of other vascular implants and grafts: Secondary | ICD-10-CM | POA: Insufficient documentation

## 2021-05-02 DIAGNOSIS — I10 Essential (primary) hypertension: Secondary | ICD-10-CM | POA: Insufficient documentation

## 2021-06-25 ENCOUNTER — Other Ambulatory Visit (INDEPENDENT_AMBULATORY_CARE_PROVIDER_SITE_OTHER): Payer: Self-pay | Admitting: Vascular Surgery

## 2021-06-25 ENCOUNTER — Encounter (INDEPENDENT_AMBULATORY_CARE_PROVIDER_SITE_OTHER): Payer: Self-pay | Admitting: Nurse Practitioner

## 2021-06-25 ENCOUNTER — Other Ambulatory Visit (INDEPENDENT_AMBULATORY_CARE_PROVIDER_SITE_OTHER): Payer: Medicare HMO

## 2021-06-25 ENCOUNTER — Ambulatory Visit (INDEPENDENT_AMBULATORY_CARE_PROVIDER_SITE_OTHER): Payer: Medicare HMO | Admitting: Nurse Practitioner

## 2021-06-25 VITALS — BP 157/85 | HR 67 | Resp 17 | Ht 66.0 in | Wt 161.0 lb

## 2021-06-25 DIAGNOSIS — F17200 Nicotine dependence, unspecified, uncomplicated: Secondary | ICD-10-CM

## 2021-06-25 DIAGNOSIS — I7143 Infrarenal abdominal aortic aneurysm, without rupture: Secondary | ICD-10-CM

## 2021-07-08 ENCOUNTER — Encounter (INDEPENDENT_AMBULATORY_CARE_PROVIDER_SITE_OTHER): Payer: Self-pay | Admitting: Nurse Practitioner

## 2021-07-08 NOTE — Progress Notes (Signed)
Subjective:    Patient ID: Susan Farrell, female    DOB: 1945/03/04, 76 y.o.   MRN: [id number] No chief complaint on file.   The patient returns to the office for surveillance of an abdominal aortic aneurysm status post stent graft placement on 06/12/2020.   Patient denies abdominal pain or back pain, no other abdominal complaints. No groin related complaints. No symptoms consistent with distal embolization No changes in claudication distance or new rest pain symptoms.   There have been no interval changes in his overall healthcare since his last visit.   Patient denies amaurosis fugax or TIA symptoms.  The patient denies recent episodes of angina or shortness of breath.   Duplex US of the aorta and iliac arteries shows a 6.12 AAA sac with no endoleak, 6.2 in the sac compared to the previous study.     Review of Systems  Gastrointestinal:  Negative for abdominal distention and abdominal pain.  All other systems reviewed and are negative.      Objective:   Physical Exam Vitals reviewed.  HENT:     Head: Normocephalic.  Cardiovascular:     Rate and Rhythm: Normal rate.     Pulses: Normal pulses.  Pulmonary:     Effort: Pulmonary effort is normal.  Skin:    General: Skin is warm and dry.  Neurological:     Mental Status: She is alert and oriented to person, place, and time.  Psychiatric:        Mood and Affect: Mood normal.        Behavior: Behavior normal.        Thought Content: Thought content normal.        Judgment: Judgment normal.     BP (!) 157/85 (BP Location: Right Arm)   Pulse 67   Resp 17   Ht 5\' 6"  (1.676 m)   Wt 161 lb (73 kg)   BMI 25.99 kg/m   History reviewed. No pertinent past medical history.  Social History   Socioeconomic History   Marital status: Married    Spouse name: Not on file   Number of children: Not on file   Years of education: Not on file   Highest education level: Not on file  Occupational History   Not on file   Tobacco Use   Smoking status: Every Day    Types: Cigarettes   Smokeless tobacco: Current  Substance and Sexual Activity   Alcohol use: Not on file   Drug use: Never   Sexual activity: Yes  Other Topics Concern   Not on file  Social History Narrative   Not on file   Social Determinants of Health   Financial Resource Strain: Not on file  Food Insecurity: Not on file  Transportation Needs: Not on file  Physical Activity: Not on file  Stress: Not on file  Social Connections: Not on file  Intimate Partner Violence: Not on file    Past Surgical History:  Procedure Laterality Date   ABDOMINAL HYSTERECTOMY     CHOLECYSTECTOMY     ENDOVASCULAR REPAIR/STENT GRAFT N/A 06/12/2020   Procedure: ENDOVASCULAR REPAIR/STENT GRAFT;  Surgeon: 06/14/2020, MD;  Location: [facility];  Service: Cardiovascular;  Laterality: N/A;    Family History  Problem Relation Age of Onset   Breast cancer Neg Hx     No Known Allergies     Latest Ref Rng & Units 06/13/2020    5:05 AM 06/12/2020   10:07 AM  02/10/2012    8:12 AM  CBC  WBC 4.0 - 10.5 K/uL 7.0  5.1  6.7   Hemoglobin 12.0 - 15.0 g/dL 9.0  56.4  33.2   Hematocrit 36.0 - 46.0 % 25.9  34.7  39.0   Platelets 150 - 400 K/uL 131  177  246       CMP     Component Value Date/Time   NA 139 06/13/2020 0505   NA 142 02/10/2012 0812   K 3.4 (L) 06/13/2020 0505   CL 109 06/13/2020 0505   CL 110 (H) 02/10/2012 0812   CO2 22 06/13/2020 0505   CO2 29 02/10/2012 0812   GLUCOSE 120 (H) 06/13/2020 0505   GLUCOSE 86 02/10/2012 0812   BUN 9 06/13/2020 0505   CREATININE 0.62 06/13/2020 0505   CALCIUM 8.3 (L) 06/13/2020 0505   CALCIUM 9.0 02/10/2012 0812   PROT 7.8 06/12/2020 1007   PROT 8.2 02/10/2012 0812   ALBUMIN 3.9 06/12/2020 1007   ALBUMIN 3.8 02/10/2012 0812   AST 28 06/12/2020 1007   AST 20 02/10/2012 0812   ALT 11 06/12/2020 1007   ALKPHOS 60 06/12/2020 1007   ALKPHOS 109 02/10/2012 0812   BILITOT 0.6 06/12/2020  1007   BILITOT 0.4 02/10/2012 0812   GFRNONAA >60 06/13/2020 0505     No results found.     Assessment & Plan:   1. Infrarenal abdominal aortic aneurysm (AAA) without rupture (HCC) Recommend:  Patient is status post successful endovascular repair of the AAA.   No further intervention is required at this time.   No endoleak is detected and the aneurysm sac is stable.  The patient will continue antiplatelet therapy as prescribed as well as aggressive management of hyperlipidemia. Exercise is again strongly encouraged.   However, endografts require continued surveillance with ultrasound or CT scan. This is mandatory to detect any changes that allow repressurization of the aneurysm sac.  The patient is informed that this would be asymptomatic.  The patient is reminded that lifelong routine surveillance is a necessity with an endograft. Patient will continue to follow-up at the specified interval with ultrasound of the aorta.   2. Tobacco use disorder Smoking cessation was discussed, 3-10 minutes spent on this topic specifically    Current Outpatient Medications on File Prior to Visit  Medication Sig Dispense Refill   Calcium Carbonate-Vitamin D 600-200 MG-UNIT TABS Take 1 tablet by mouth daily.     Iron Combinations (NIFEREX) TABS Take by mouth.     metoprolol succinate (TOPROL-XL) 50 MG 24 hr tablet Take 1 tablet by mouth daily.     Multiple Vitamins-Minerals (WOMENS MULTIVITAMIN + COLLAGEN PO) Take 1 tablet by mouth daily.     rosuvastatin (CRESTOR) 5 MG tablet Take 1 tablet by mouth daily.     SYNTHROID 100 MCG tablet Take 100 mcg by mouth daily.     No current facility-administered medications on file prior to visit.    There are no Patient Instructions on file for this visit. No follow-ups on file.   [place]iana Spinner, NP

## 2021-07-19 DIAGNOSIS — L930 Discoid lupus erythematosus: Secondary | ICD-10-CM | POA: Diagnosis not present

## 2021-08-19 DIAGNOSIS — I10 Essential (primary) hypertension: Secondary | ICD-10-CM | POA: Diagnosis not present

## 2021-08-19 DIAGNOSIS — Z0189 Encounter for other specified special examinations: Secondary | ICD-10-CM | POA: Diagnosis not present

## 2021-08-19 DIAGNOSIS — R7309 Other abnormal glucose: Secondary | ICD-10-CM | POA: Diagnosis not present

## 2021-08-19 DIAGNOSIS — D649 Anemia, unspecified: Secondary | ICD-10-CM | POA: Diagnosis not present

## 2021-08-20 DIAGNOSIS — R7309 Other abnormal glucose: Secondary | ICD-10-CM | POA: Diagnosis not present

## 2021-08-20 DIAGNOSIS — D649 Anemia, unspecified: Secondary | ICD-10-CM | POA: Diagnosis not present

## 2021-08-20 DIAGNOSIS — I10 Essential (primary) hypertension: Secondary | ICD-10-CM | POA: Diagnosis not present

## 2021-08-26 DIAGNOSIS — M8580 Other specified disorders of bone density and structure, unspecified site: Secondary | ICD-10-CM | POA: Diagnosis not present

## 2021-08-26 DIAGNOSIS — I10 Essential (primary) hypertension: Secondary | ICD-10-CM | POA: Diagnosis not present

## 2021-08-26 DIAGNOSIS — L930 Discoid lupus erythematosus: Secondary | ICD-10-CM | POA: Diagnosis not present

## 2021-08-26 DIAGNOSIS — Z95828 Presence of other vascular implants and grafts: Secondary | ICD-10-CM | POA: Diagnosis not present

## 2021-08-26 DIAGNOSIS — Z1231 Encounter for screening mammogram for malignant neoplasm of breast: Secondary | ICD-10-CM | POA: Diagnosis not present

## 2021-08-26 DIAGNOSIS — D649 Anemia, unspecified: Secondary | ICD-10-CM | POA: Diagnosis not present

## 2021-08-26 DIAGNOSIS — F17210 Nicotine dependence, cigarettes, uncomplicated: Secondary | ICD-10-CM | POA: Diagnosis not present

## 2021-08-28 ENCOUNTER — Other Ambulatory Visit: Payer: Self-pay

## 2021-08-28 ENCOUNTER — Encounter: Payer: Self-pay | Admitting: *Deleted

## 2021-08-28 ENCOUNTER — Emergency Department: Payer: Medicare HMO

## 2021-08-28 ENCOUNTER — Inpatient Hospital Stay
Admission: EM | Admit: 2021-08-28 | Discharge: 2021-09-02 | DRG: 066 | Disposition: A | Discharge status: Rehabilitation Facility | Payer: Medicare HMO | Attending: Internal Medicine | Admitting: Internal Medicine

## 2021-08-28 DIAGNOSIS — R29702 NIHSS score 2: Secondary | ICD-10-CM | POA: Diagnosis present

## 2021-08-28 DIAGNOSIS — I6381 Other cerebral infarction due to occlusion or stenosis of small artery: Principal | ICD-10-CM | POA: Diagnosis present

## 2021-08-28 DIAGNOSIS — I10 Essential (primary) hypertension: Secondary | ICD-10-CM | POA: Diagnosis not present

## 2021-08-28 DIAGNOSIS — Z79890 Hormone replacement therapy (postmenopausal): Secondary | ICD-10-CM | POA: Diagnosis not present

## 2021-08-28 DIAGNOSIS — Z8679 Personal history of other diseases of the circulatory system: Secondary | ICD-10-CM

## 2021-08-28 DIAGNOSIS — R29810 Facial weakness: Secondary | ICD-10-CM | POA: Diagnosis present

## 2021-08-28 DIAGNOSIS — M47812 Spondylosis without myelopathy or radiculopathy, cervical region: Secondary | ICD-10-CM | POA: Diagnosis not present

## 2021-08-28 DIAGNOSIS — D509 Iron deficiency anemia, unspecified: Secondary | ICD-10-CM | POA: Diagnosis present

## 2021-08-28 DIAGNOSIS — Z20822 Contact with and (suspected) exposure to covid-19: Secondary | ICD-10-CM | POA: Diagnosis not present

## 2021-08-28 DIAGNOSIS — G8314 Monoplegia of lower limb affecting left nondominant side: Secondary | ICD-10-CM | POA: Diagnosis present

## 2021-08-28 DIAGNOSIS — Z90710 Acquired absence of both cervix and uterus: Secondary | ICD-10-CM

## 2021-08-28 DIAGNOSIS — L930 Discoid lupus erythematosus: Secondary | ICD-10-CM | POA: Diagnosis present

## 2021-08-28 DIAGNOSIS — I160 Hypertensive urgency: Secondary | ICD-10-CM

## 2021-08-28 DIAGNOSIS — F17210 Nicotine dependence, cigarettes, uncomplicated: Secondary | ICD-10-CM | POA: Diagnosis present

## 2021-08-28 DIAGNOSIS — E039 Hypothyroidism, unspecified: Secondary | ICD-10-CM | POA: Diagnosis not present

## 2021-08-28 DIAGNOSIS — Z79899 Other long term (current) drug therapy: Secondary | ICD-10-CM | POA: Diagnosis not present

## 2021-08-28 DIAGNOSIS — R29703 NIHSS score 3: Secondary | ICD-10-CM | POA: Diagnosis not present

## 2021-08-28 DIAGNOSIS — D696 Thrombocytopenia, unspecified: Secondary | ICD-10-CM | POA: Diagnosis not present

## 2021-08-28 DIAGNOSIS — R29898 Other symptoms and signs involving the musculoskeletal system: Secondary | ICD-10-CM | POA: Diagnosis not present

## 2021-08-28 DIAGNOSIS — I63212 Cerebral infarction due to unspecified occlusion or stenosis of left vertebral arteries: Secondary | ICD-10-CM | POA: Diagnosis not present

## 2021-08-28 DIAGNOSIS — E041 Nontoxic single thyroid nodule: Secondary | ICD-10-CM | POA: Diagnosis not present

## 2021-08-28 DIAGNOSIS — R29704 NIHSS score 4: Secondary | ICD-10-CM | POA: Diagnosis not present

## 2021-08-28 DIAGNOSIS — I6389 Other cerebral infarction: Secondary | ICD-10-CM | POA: Diagnosis not present

## 2021-08-28 DIAGNOSIS — E785 Hyperlipidemia, unspecified: Secondary | ICD-10-CM

## 2021-08-28 DIAGNOSIS — E876 Hypokalemia: Secondary | ICD-10-CM | POA: Diagnosis not present

## 2021-08-28 DIAGNOSIS — I63233 Cerebral infarction due to unspecified occlusion or stenosis of bilateral carotid arteries: Secondary | ICD-10-CM | POA: Diagnosis not present

## 2021-08-28 DIAGNOSIS — R531 Weakness: Secondary | ICD-10-CM | POA: Diagnosis not present

## 2021-08-28 DIAGNOSIS — I6503 Occlusion and stenosis of bilateral vertebral arteries: Secondary | ICD-10-CM | POA: Diagnosis not present

## 2021-08-28 DIAGNOSIS — I639 Cerebral infarction, unspecified: Secondary | ICD-10-CM | POA: Diagnosis not present

## 2021-08-28 DIAGNOSIS — G319 Degenerative disease of nervous system, unspecified: Secondary | ICD-10-CM | POA: Diagnosis not present

## 2021-08-28 DIAGNOSIS — I672 Cerebral atherosclerosis: Secondary | ICD-10-CM | POA: Diagnosis not present

## 2021-08-28 LAB — URINALYSIS, ROUTINE W REFLEX MICROSCOPIC
Bilirubin Urine: NEGATIVE
Glucose, UA: NEGATIVE mg/dL
Hgb urine dipstick: NEGATIVE
Ketones, ur: NEGATIVE mg/dL
Nitrite: NEGATIVE
Protein, ur: NEGATIVE mg/dL
Specific Gravity, Urine: 1.012 (ref 1.005–1.030)
pH: 7 (ref 5.0–8.0)

## 2021-08-28 LAB — ETHANOL: Alcohol, Ethyl (B): <10 mg/dL (ref <10)

## 2021-08-28 LAB — COMPREHENSIVE METABOLIC PANEL
ALT: 10 U/L (ref 0–44)
AST: 21 U/L (ref 15–41)
Albumin: 3.9 g/dL (ref 3.5–5.0)
Alkaline Phosphatase: 64 U/L (ref 38–126)
Anion gap: 8 (ref 5–15)
BUN: 10 mg/dL (ref 8–23)
CO2: 27 mmol/L (ref 22–32)
Calcium: 9.2 mg/dL (ref 8.9–10.3)
Chloride: 106 mmol/L (ref 98–111)
Creatinine, Ser: 0.99 mg/dL (ref 0.44–1.00)
GFR, Estimated: 60 mL/min — ABNORMAL LOW (ref >60)
Glucose, Bld: 112 mg/dL — ABNORMAL HIGH (ref 70–99)
Potassium: 3.6 mmol/L (ref 3.5–5.1)
Sodium: 141 mmol/L (ref 135–145)
Total Bilirubin: 0.6 mg/dL (ref 0.3–1.2)
Total Protein: 7.4 g/dL (ref 6.5–8.1)

## 2021-08-28 LAB — CBC
HCT: 33.5 % — ABNORMAL LOW (ref 36.0–46.0)
Hemoglobin: 10.9 g/dL — ABNORMAL LOW (ref 12.0–15.0)
MCH: 25.7 pg — ABNORMAL LOW (ref 26.0–34.0)
MCHC: 32.5 g/dL (ref 30.0–36.0)
MCV: 79 fL — ABNORMAL LOW (ref 80.0–100.0)
Platelets: 149 10*3/uL — ABNORMAL LOW (ref 150–400)
RBC: 4.24 MIL/uL (ref 3.87–5.11)
RDW: 15.6 % — ABNORMAL HIGH (ref 11.5–15.5)
WBC: 4.2 10*3/uL (ref 4.0–10.5)
nRBC: 0 % (ref 0.0–0.2)

## 2021-08-28 LAB — URINE DRUG SCREEN, QUALITATIVE (ARMC ONLY)
Amphetamines, Ur Screen: NOT DETECTED
Barbiturates, Ur Screen: NOT DETECTED
Benzodiazepine, Ur Scrn: NOT DETECTED
Cannabinoid 50 Ng, Ur ~~LOC~~: NOT DETECTED
Cocaine Metabolite,Ur ~~LOC~~: NOT DETECTED
MDMA (Ecstasy)Ur Screen: NOT DETECTED
Methadone Scn, Ur: NOT DETECTED
Opiate, Ur Screen: NOT DETECTED
Phencyclidine (PCP) Ur S: NOT DETECTED
Tricyclic, Ur Screen: NOT DETECTED

## 2021-08-28 LAB — RESP PANEL BY RT-PCR (FLU A&B, COVID) ARPGX2
Influenza A by PCR: NEGATIVE
Influenza B by PCR: NEGATIVE
SARS Coronavirus 2 by RT PCR: NEGATIVE

## 2021-08-28 LAB — PROTIME-INR
INR: 1.1 (ref 0.8–1.2)
Prothrombin Time: 14 seconds (ref 11.4–15.2)

## 2021-08-28 LAB — DIFFERENTIAL
Abs Immature Granulocytes: 0 10*3/uL (ref 0.00–0.07)
Basophils Absolute: 0 10*3/uL (ref 0.0–0.1)
Basophils Relative: 0 %
Eosinophils Absolute: 0 10*3/uL (ref 0.0–0.5)
Eosinophils Relative: 1 %
Immature Granulocytes: 0 %
Lymphocytes Relative: 41 %
Lymphs Abs: 1.7 10*3/uL (ref 0.7–4.0)
Monocytes Absolute: 0.4 10*3/uL (ref 0.1–1.0)
Monocytes Relative: 9 %
Neutro Abs: 2 10*3/uL (ref 1.7–7.7)
Neutrophils Relative %: 49 %

## 2021-08-28 LAB — CBG MONITORING, ED: Glucose-Capillary: 124 mg/dL — ABNORMAL HIGH (ref 70–99)

## 2021-08-28 LAB — APTT: aPTT: 36 seconds (ref 24–36)

## 2021-08-28 MED ORDER — TRAZODONE HCL 50 MG PO TABS: 25.0000 mg | ORAL_TABLET | Freq: Every evening | ORAL | Status: DC | PRN

## 2021-08-28 MED ORDER — ADULT MULTIVITAMIN W/MINERALS CH
ORAL_TABLET | Freq: Every day | ORAL | Status: DC
  Administered 2021-08-29 – 2021-09-02 (×5): 1 via ORAL
  Filled 2021-08-28 (×5): qty 1

## 2021-08-28 MED ORDER — MAGNESIUM HYDROXIDE 400 MG/5ML PO SUSP: 30.0000 mL | Freq: Every day | ORAL | Status: DC | PRN

## 2021-08-28 MED ORDER — LEVOTHYROXINE SODIUM 100 MCG PO TABS
100.0000 ug | ORAL_TABLET | Freq: Every day | ORAL | Status: DC
  Administered 2021-08-29 – 2021-09-02 (×5): 100 ug via ORAL
  Filled 2021-08-28 (×5): qty 1

## 2021-08-28 MED ORDER — ACETAMINOPHEN 325 MG PO TABS: 650.0000 mg | ORAL_TABLET | Freq: Four times a day (QID) | ORAL | Status: DC | PRN

## 2021-08-28 MED ORDER — ROSUVASTATIN CALCIUM 10 MG PO TABS
5.0000 mg | ORAL_TABLET | Freq: Every day | ORAL | Status: DC
Start: 2021-08-29 — End: 2021-08-29
  Administered 2021-08-29: 5 mg via ORAL
  Filled 2021-08-28: qty 1

## 2021-08-28 MED ORDER — ENOXAPARIN SODIUM 40 MG/0.4ML IJ SOSY
40.0000 mg | PREFILLED_SYRINGE | INTRAMUSCULAR | Status: DC
  Administered 2021-08-29 – 2021-09-02 (×5): 40 mg via SUBCUTANEOUS
  Filled 2021-08-28 (×5): qty 0.4

## 2021-08-28 MED ORDER — POLYSACCHARIDE IRON COMPLEX 150 MG PO CAPS
150.0000 mg | ORAL_CAPSULE | Freq: Every day | ORAL | Status: DC
  Administered 2021-08-29 – 2021-09-02 (×5): 150 mg via ORAL
  Filled 2021-08-28 (×5): qty 1

## 2021-08-28 MED ORDER — ONDANSETRON HCL 4 MG PO TABS: 4.0000 mg | ORAL_TABLET | Freq: Four times a day (QID) | ORAL | Status: DC | PRN

## 2021-08-28 MED ORDER — SODIUM CHLORIDE 0.9 % IV SOLN: INTRAVENOUS | Status: DC

## 2021-08-28 MED ORDER — OYSTER SHELL CALCIUM/D3 500-5 MG-MCG PO TABS
1.0000 | ORAL_TABLET | Freq: Every day | ORAL | Status: DC
  Administered 2021-08-29 – 2021-09-02 (×5): 1 via ORAL
  Filled 2021-08-28 (×5): qty 1

## 2021-08-28 MED ORDER — METOPROLOL SUCCINATE ER 50 MG PO TB24
50.0000 mg | ORAL_TABLET | Freq: Every day | ORAL | Status: DC
  Administered 2021-08-29 – 2021-09-02 (×5): 50 mg via ORAL
  Filled 2021-08-28 (×5): qty 1

## 2021-08-28 MED ORDER — ACETAMINOPHEN 650 MG RE SUPP: 650.0000 mg | Freq: Four times a day (QID) | RECTAL | Status: DC | PRN

## 2021-08-28 MED ORDER — IOHEXOL 350 MG/ML SOLN
75.0000 mL | Freq: Once | INTRAVENOUS | Status: AC | PRN
  Administered 2021-08-28: 75 mL via INTRAVENOUS

## 2021-08-28 MED ORDER — STROKE: EARLY STAGES OF RECOVERY BOOK
Freq: Once | Status: AC
  Administered 2021-08-29: 1

## 2021-08-28 MED ORDER — ONDANSETRON HCL 4 MG/2ML IJ SOLN: 4.0000 mg | Freq: Four times a day (QID) | INTRAMUSCULAR | Status: DC | PRN

## 2021-08-28 NOTE — ED Triage Notes (Signed)
Pt unable to lift/move left leg.  Sx began at 1300 today.  No known injury.  No headache.  Pt reports intermittent weakness in left arm   pt alert  speech clear.

## 2021-08-28 NOTE — ED Provider Triage Note (Signed)
  Emergency Medicine Provider Triage Evaluation Note  Susan Farrell , a 76 y.o.female,  was evaluated in triage.  Pt complains of left-sided weakness in her arm and leg.  She states that she is barely able to move her left lower extremity at all.  This occurred at approximately 1300 today.   Review of Systems  Positive: Left-sided numbness/weakness Negative: Denies fever, chest pain, vomiting  Physical Exam   Vitals:   08/28/21 1953  BP: (!) 184/96  Pulse: 69  Resp: 20  Temp: 98.7 F (37.1 C)  SpO2: 95%   Gen:   Awake, no distress   Resp:  Normal effort  MSK:   Moves extremities without difficulty  Other:  Patient is unable to move her left lower extremity.  Weakness appreciated in the left upper extremity.  No facial droop.  Medical Decision Making  Given the patient's initial medical screening exam, the following diagnostic evaluation has been ordered. The patient will be placed in the appropriate treatment space, once one is available, to complete the evaluation and treatment. I have discussed the plan of care with the patient and I have advised the patient that an ED physician or mid-level practitioner will reevaluate their condition after the test results have been received, as the results may give them additional insight into the type of treatment they may need.    Diagnostics: Code stroke protocol  Treatments: none immediately   Varney Daily, [place] 08/28/21 1956

## 2021-08-28 NOTE — H&P (Signed)
PATIENT NAME: Susan Farrell    MR#:  [id number]  DATE OF BIRTH:  06/10/1945  DATE OF ADMISSION:  08/28/2021  PRIMARY CARE PHYSICIAN: Barbette Reichmann, MD   Patient is coming from: Home  REQUESTING/REFERRING PHYSICIAN: Willy Eddy, MD  CHIEF COMPLAINT:   Chief Complaint  Patient presents with   Extremity Weakness    HISTORY OF PRESENT ILLNESS:  Susan Farrell is a 76 y.o. African-American female with medical history significant for discoid lupus and hypertension, dyslipidemia, hypothyroidism, abdominal aortic aneurysm, s/p rupture and repair, and tobacco abuse, who presented to the emergency room with acute onset of left lower extremity weakness that started around 1 PM today with no paresthesias or slurred speech.  She denies any headache or dizziness or blurred vision.  No tinnitus or vertigo.  No urinary or stool incontinence.  No other focal deficits.  No dysuria, oliguria or hematuria or flank pain.  No nausea or vomiting or abdominal pain.  No chest pain or palpitations.  No cough or wheezing or orthopnea or paroxysmal nocturnal dyspnea.  ED Course: Upon presentation to the emergency room, BP was 184/96 with otherwise normal vital signs.  Labs are unremarkable CMP.  CBC showed hemoglobin 10.9 hematocrit 33.5 slightly lower than previous levels with platelets of 149 bilirubin previous levels.  Informed antigens and COVID-19 PCR [place] negative.  UA was unremarkable.  Urine drug screen came back negative.  EKG as reviewed by me : EKG showed sinus rhythm rate of 65 with probable left atrial enlargement and LVH. Imaging: Portable chest x-ray showed mild congestive heart failure and interstitial edema. Code stroke head CT scan revealed no acute intracranial normalities.  It showed chronic right basal ganglia lacunar infarct. Head and neck CTA revealed the following: 1. The common carotid, internal carotid and vertebral arteries are patent within the neck.  Mild atherosclerotic narrowing at the origin of the left vertebral artery. 2. Aortic Atherosclerosis (ICD10-I70.0). 3. 2.7 cm left thyroid lobe nodule. This nodule was previously evaluated by ultrasound on 02/08/2013, and fine-needle aspiration was recommended at that time. Please refer to this prior report for further details. 4. Cervical spondylosis. Most notably, a posterior disc osteophyte complex contributes to suspected severe spinal canal stenosis at C4-C5. Consider a cervical spine MRI to assess for spinal cord impingement at this level, when clinically appropriate.   CTA head:   1. No intracranial large vessel occlusion or proximal high-grade arterial stenosis is identified. 2. 1-2 mm ventrally projecting vascular protrusion arising from the cavernous right ICA, suspicious for an aneurysm.   The patient will be admitted to an observation medical telemetry bed for further evaluation and management. PAST MEDICAL HISTORY:  Hypertension, tobacco abuse and discoid lupus  PAST SURGICAL HISTORY:   Past Surgical History:  Procedure Laterality Date   ABDOMINAL HYSTERECTOMY     CHOLECYSTECTOMY     ENDOVASCULAR REPAIR/STENT GRAFT N/A 06/12/2020   Procedure: ENDOVASCULAR REPAIR/STENT GRAFT;  Surgeon: Renford Dills, MD;  Location: [facility] INVASIVE CV LAB;  Service: Cardiovascular;  Laterality: N/A;    SOCIAL HISTORY:   Social History   Tobacco Use   Smoking status: Every Day    Types: Cigarettes   Smokeless tobacco: Current  Substance Use Topics   Alcohol use: Not Currently    FAMILY HISTORY:   Family History  Problem Relation Age of Onset   Breast cancer Neg Hx     DRUG ALLERGIES:  No Known Allergies  REVIEW OF SYSTEMS:  ROS As per history of present illness. All pertinent systems were reviewed above. Constitutional, HEENT, cardiovascular, respiratory, GI, GU, musculoskeletal, neuro, psychiatric, endocrine, integumentary and hematologic systems were reviewed  and are otherwise negative/unremarkable except for positive findings mentioned above in the HPI.   MEDICATIONS AT HOME:   Prior to Admission medications   Medication Sig Start Date End Date Taking? Authorizing Provider  Calcium Carbonate-Vitamin D 600-200 MG-UNIT TABS Take 1 tablet by mouth daily.    [provider]  Iron Combinations (NIFEREX) TABS Take by mouth. 06/22/20   [provider]  metoprolol succinate (TOPROL-XL) 50 MG 24 hr tablet Take 1 tablet by mouth daily. 06/20/21   [provider]  Multiple Vitamins-Minerals (WOMENS MULTIVITAMIN + COLLAGEN PO) Take 1 tablet by mouth daily.    [provider]  rosuvastatin (CRESTOR) 5 MG tablet Take 1 tablet by mouth daily. 06/20/21   [provider]  SYNTHROID 100 MCG tablet Take 100 mcg by mouth daily. 04/15/20   [provider]      VITAL SIGNS:  Blood pressure (!) 158/90, pulse (!) 58, temperature 98 F (36.7 C), resp. rate (!) 22, height 5\' 6"  (1.676 m), weight 72.1 kg, SpO2 100 %.  PHYSICAL EXAMINATION:  Physical Exam  GENERAL:  76 y.o.-year-old patient lying in the bed with no acute distress.  EYES: Pupils equal, round, reactive to light and accommodation. No scleral icterus. Extraocular muscles intact.  HEENT: Head atraumatic, normocephalic. Oropharynx and nasopharynx clear.  NECK:  Supple, no jugular venous distention. No thyroid enlargement, no tenderness.  LUNGS: Normal breath sounds bilaterally, no wheezing, rales,rhonchi or crepitation. No use of accessory muscles of respiration.  CARDIOVASCULAR: Regular rate and rhythm, S1, S2 normal. No murmurs, rubs, or gallops.  ABDOMEN: Soft, nondistended, nontender. Bowel sounds present. No organomegaly or mass.  EXTREMITIES: No pedal edema, cyanosis, or clubbing.  NEUROLOGIC: Cranial nerves II through XII are intact. Muscle strength 5/5 in all extremities except for the left leg 4/5 in strength. Sensation intact. Gait not checked.   PSYCHIATRIC: The patient is alert and oriented x 3.  Normal affect and good eye contact. SKIN: Discoid lupus eruption over both upper extremities and neck without changes per patient. LABORATORY PANEL:   CBC Recent Labs  Lab 08/28/21 2018  WBC 4.2  HGB 10.9*  HCT 33.5*  PLT 149*   ------------------------------------------------------------------------------------------------------------------  Chemistries  Recent Labs  Lab 08/28/21 2018  NA 141  K 3.6  CL 106  CO2 27  GLUCOSE 112*  BUN 10  CREATININE 0.99  CALCIUM 9.2  AST 21  ALT 10  ALKPHOS 64  BILITOT 0.6   ------------------------------------------------------------------------------------------------------------------  Cardiac Enzymes No results for input(s): "TROPONINI" in the last 168 hours. ------------------------------------------------------------------------------------------------------------------  RADIOLOGY:  CT ANGIO HEAD NECK W WO CM (CODE STROKE)  Result Date: 08/28/2021 CLINICAL DATA:  Stroke, follow-up. Additional history provided: Patient unable to lift/move left leg. Symptoms began at 1300 today. Intermittent weakness in left arm. EXAM: CT ANGIOGRAPHY HEAD AND NECK TECHNIQUE: Multidetector CT imaging of the head and neck was performed using the standard protocol during bolus administration of intravenous contrast. Multiplanar CT image reconstructions and MIPs were obtained to evaluate the vascular anatomy. Carotid stenosis measurements (when applicable) are obtained utilizing NASCET criteria, using the distal internal carotid diameter as the denominator. RADIATION DOSE REDUCTION: This exam was performed according to the departmental dose-optimization program which includes automated exposure control, adjustment of the mA and/or kV according to patient size and/or use of iterative reconstruction technique.  CONTRAST:  75mL OMNIPAQUE IOHEXOL 350 MG/ML SOLN COMPARISON:  Noncontrast head CT performed  earlier today 08/28/2021. FINDINGS: CTA NECK FINDINGS Aortic arch: Common origin of the innominate and left common carotid arteries. Atherosclerotic plaque within the aortic arch. No hemodynamically significant innominate or proximal subclavian artery stenosis. Right carotid system: CCA and ICA patent within the neck without stenosis or significant atherosclerotic disease. Left carotid system: CCA and ICA patent within the neck without stenosis or significant atherosclerotic disease. Vertebral arteries: Vertebral arteries patent within the neck. Mild atherosclerotic narrowing at the origin of the left vertebral artery. Skeleton: Nonspecific reversal of the expected cervical lordosis. Cervical spondylosis. Most notably at C4-C5, a posterior disc osteophyte complex contributes to suspected severe spinal canal stenosis (series 6, image 157). Other neck: 2.7 cm nodule within the left thyroid lobe. Upper chest: No consolidation within the imaged lung apices. Review of the MIP images confirms the above findings CTA HEAD FINDINGS Anterior circulation: The intracranial internal carotid arteries are patent. Nonstenotic atherosclerotic plaque within both vessels. The M1 middle cerebral arteries are patent. Atherosclerotic irregularity of the M2 and more distal MCA vessels, bilaterally. No M2 proximal branch occlusion or high-grade proximal stenosis is identified. The anterior cerebral arteries are patent. 1-2 mm ventrally projecting vascular protrusion arising from the cavernous right ICA, suspicious for an aneurysm (series 8, image 97) (series 6, image 260). Posterior circulation: The intracranial vertebral arteries are patent. The basilar artery is patent. The posterior cerebral arteries are patent. Posterior communicating arteries are diminutive or absent, bilaterally. Venous sinuses: Within the limitations of contrast timing, no convincing thrombus. Anatomic variants: As described. Review of the MIP images confirms the  above findings No emergent large vessel occlusion identified. These results were called by telephone at the time of interpretation on 08/28/2021 at 10:11 pm to provider Willy EddyPATRICK ROBINSON , who verbally acknowledged these results. IMPRESSION: CTA neck: 1. The common carotid, internal carotid and vertebral arteries are patent within the neck. Mild atherosclerotic narrowing at the origin of the left vertebral artery. 2. Aortic Atherosclerosis (ICD10-I70.0). 3. 2.7 cm left thyroid lobe nodule. This nodule was previously evaluated by ultrasound on 02/08/2013, and fine-needle aspiration was recommended at that time. Please refer to this prior report for further details. 4. Cervical spondylosis. Most notably, a posterior disc osteophyte complex contributes to suspected severe spinal canal stenosis at C4-C5. Consider a cervical spine MRI to assess for spinal cord impingement at this level, when clinically appropriate. CTA head: 1. No intracranial large vessel occlusion or proximal high-grade arterial stenosis is identified. 2. 1-2 mm ventrally projecting vascular protrusion arising from the cavernous right ICA, suspicious for an aneurysm. Electronically Signed   By: Jackey LogeKyle  Golden D.O.   On: 08/28/2021 22:14   DG Chest Portable 1 View  Result Date: 08/28/2021 CLINICAL DATA:  Weakness, tobacco abuse EXAM: PORTABLE CHEST 1 VIEW COMPARISON:  None Available. FINDINGS: Single frontal view of the chest demonstrates mild enlargement of the cardiac silhouette. There is increased central vascular congestion. Subpleural interstitial prominence at the lung bases may reflect Kerley lines related to interstitial edema. No effusion or pneumothorax. No acute airspace disease. IMPRESSION: 1. Findings consistent with mild congestive heart failure and interstitial edema. Electronically Signed   By: Sharlet SalinaMichael  Brown M.D.   On: 08/28/2021 20:52   CT HEAD CODE STROKE WO CONTRAST  Result Date: 08/28/2021 CLINICAL DATA:  Code stroke. Neuro  deficit, acute, stroke suspected. Left-sided weakness. Last known well 1 p.m. today. EXAM: CT HEAD WITHOUT CONTRAST TECHNIQUE: Contiguous axial  images were obtained from the base of the skull through the vertex without intravenous contrast. RADIATION DOSE REDUCTION: This exam was performed according to the departmental dose-optimization program which includes automated exposure control, adjustment of the mA and/or kV according to patient size and/or use of iterative reconstruction technique. COMPARISON:  None Available. FINDINGS: Brain: Mild generalized cerebral atrophy. Chronic lacunar infarct within the right basal ganglia (for instance as seen on series 3, image 11). Additional small hypodense focus more inferiorly within the right basal ganglia, favored to reflect a prominent perivascular space (for instance as seen on series 5, image 29). Partially empty sella turcica. There is no acute intracranial hemorrhage. No demarcated cortical infarct. No extra-axial fluid collection. No evidence of an intracranial mass. No midline shift. Vascular: No hyperdense vessel. Atherosclerotic calcifications. Skull: No fracture or aggressive osseous lesion. Sinuses/Orbits: No mass or acute finding within the imaged orbits. No significant paranasal sinus disease at the imaged levels. ASPECTS (Alberta Stroke Program Early CT Score) - Ganglionic level infarction (caudate, lentiform nuclei, internal capsule, insula, M1-M3 cortex): 7 - Supraganglionic infarction (M4-M6 cortex): 3 Total score (0-10 with 10 being normal): 10 (when discounting the chronic right basal ganglia lacunar infarct). No evidence of acute intracranial abnormality. These results were called by telephone at the time of interpretation on 08/28/2021 at 8:16 pm to provider Dr. Roxan Hockey, Who verbally acknowledged these results. IMPRESSION: No evidence of acute intracranial abnormality. ASPECTS is 10. Chronic right basal ganglia lacunar infarct. Mild generalized cerebral  atrophy. Electronically Signed   By: Jackey Loge D.O.   On: 08/28/2021 20:17      IMPRESSION AND PLAN:  Assessment and Plan: * CVA (cerebral vascular accident) [facility]) - The patient will be admitted to a medical telemetry observation bed. - She presented with left lower extremity weakness. - Will obtain a brain MRI without contrast. - She will be placed on baby aspirin for now.  Given the suspicion about right ICA aneurysm we will hold off on Plavix. - 2D echo with bubble study will be obtained. - Neurology consult will be obtained. - Dr. Amada Jupiter was notified and is aware about the patient. - PT/OT and ST consults will be obtained  Hypertensive urgency - We will continue antihypertensives for now with permissive hypertension with parameters.  Dyslipidemia - We will continue statin therapy and check fasting lipids.  Hypothyroidism - Will continue Synthroid.   DVT prophylaxis: Lovenox.  Advanced Care Planning:  Code Status: full code.  Family Communication:  The plan of care was discussed in details with the patient (and family). I answered all questions. The patient agreed to proceed with the above mentioned plan. Further management will depend upon hospital course. Disposition Plan: Back to previous home environment Consults called: Neurology All the records are reviewed and case discussed with ED provider.  Status is: Observation  I certify that at the time of admission, it is my clinical judgment that the patient will require hospital care extending less than 2 midnights.                            Dispo: The patient is from: Home              Anticipated d/c is to: Home              Patient currently is not medically stable to d/c.              Difficult to place patient:  No  Hannah Beat M.D on 08/29/2021 at 2:39 AM  [facility]   From 7 PM-7 AM, contact night-coverage [email or web address]  CC: Primary care physician; Barbette Reichmann, MD

## 2021-08-28 NOTE — ED Provider Notes (Signed)
[facility] Provider Note    Event Date/Time   First MD Initiated Contact with Patient 08/28/21 1959     (approximate)   History   Extremity Weakness   HPI  MILDERD MANOCCHIO is a 76 y.o. female with a history of AAA repair presents to the ER for evaluation of acute onset left leg weakness started around lunch just before 1:00.  No other associated symptoms.  No blurry vision no aphasia no headaches.  Denies any trauma.  No neck pain.  Denies any palpitations.  No history of stroke.  She does smoke.     Physical Exam   Triage Vital Signs: ED Triage Vitals  Enc Vitals Group     BP 08/28/21 1953 (!) 184/96     Pulse Rate 08/28/21 1953 69     Resp 08/28/21 1953 20     Temp 08/28/21 1953 98.7 F (37.1 C)     Temp Source 08/28/21 1953 Oral     SpO2 08/28/21 1953 95 %     Weight 08/28/21 1954 159 lb (72.1 kg)     Height 08/28/21 1954 5\' 6"  (1.676 m)     Head Circumference --      Peak Flow --      Pain Score 08/28/21 1954 0     Pain Loc --      Pain Edu? --      Excl. in GC? --     Most recent vital signs: Vitals:   08/28/21 2140 08/28/21 2250  BP:  (!) 174/77  Pulse:  64  Resp:  18  Temp: 98.7 F (37.1 C) 98.1 F (36.7 C)  SpO2:  100%     Constitutional: Alert  Eyes: Conjunctivae are normal.  Head: Atraumatic. Nose: No congestion/rhinnorhea. Mouth/Throat: Mucous membranes are moist.   Neck: Painless ROM.  Cardiovascular:   Good peripheral circulation throughout.  Strong dp and pt pulses.  No m/g/r Respiratory: Normal respiratory effort.  No retractions.  Gastrointestinal: Soft and nontender.  Musculoskeletal:  no deformity.   Neurologic: No facial droop.  Sensation intact throughout.  Does have some strength against gravity of the left leg but significantly weaker as compared to the right. Skin:  Skin is warm, dry and intact. No rash noted. Psychiatric: Mood and affect are normal. Speech and behavior are normal.    ED Results /  Procedures / Treatments   Labs (all labs ordered are listed, but only abnormal results are displayed) Labs Reviewed  CBC - Abnormal; Notable for the following components:      Result Value   Hemoglobin 10.9 (*)    HCT 33.5 (*)    MCV 79.0 (*)    MCH 25.7 (*)    RDW 15.6 (*)    Platelets 149 (*)    All other components within normal limits  COMPREHENSIVE METABOLIC PANEL - Abnormal; Notable for the following components:   Glucose, Bld 112 (*)    GFR, Estimated 60 (*)    All other components within normal limits  URINALYSIS, ROUTINE W REFLEX MICROSCOPIC - Abnormal; Notable for the following components:   Color, Urine YELLOW (*)    APPearance CLEAR (*)    Leukocytes,Ua TRACE (*)    Bacteria, UA RARE (*)    All other components within normal limits  CBG MONITORING, ED - Abnormal; Notable for the following components:   Glucose-Capillary 124 (*)    All other components within normal limits  RESP PANEL BY RT-PCR (FLU A&B,  COVID) ARPGX2  ETHANOL  PROTIME-INR  APTT  DIFFERENTIAL  URINE DRUG SCREEN, QUALITATIVE ([facility] ONLY)  LIPID PANEL  HEMOGLOBIN A1C  BASIC METABOLIC PANEL  CBC     EKG  ED ECG REPORT I, Willy Eddy, the attending physician, personally viewed and interpreted this ECG.   Date: 08/28/2021  EKG Time: 20:14  Rate: 65  Rhythm: sinus  Axis: normal  Intervals: normal  ST&T Change: nonspecific st abn    RADIOLOGY Please see ED Course for my review and interpretation.  I personally reviewed all radiographic images ordered to evaluate for the above acute complaints and reviewed radiology reports and findings.  These findings were personally discussed with the patient.  Please see medical record for radiology report.    PROCEDURES:  Critical Care performed: No  Procedures   MEDICATIONS ORDERED IN ED: Medications  metoprolol succinate (TOPROL-XL) 24 hr tablet 50 mg (has no administration in time range)  rosuvastatin (CRESTOR) tablet 5 mg (has no  administration in time range)  levothyroxine (SYNTHROID) tablet 100 mcg (has no administration in time range)  iron polysaccharides (NIFEREX) capsule 150 mg (has no administration in time range)  calcium-vitamin D (OSCAL WITH D) 500-5 MG-MCG per tablet 1 tablet (has no administration in time range)  multivitamin with minerals tablet (has no administration in time range)   stroke: early stages of recovery book (has no administration in time range)  enoxaparin (LOVENOX) injection 40 mg (has no administration in time range)  0.9 %  sodium chloride infusion (has no administration in time range)  acetaminophen (TYLENOL) tablet 650 mg (has no administration in time range)    Or  acetaminophen (TYLENOL) suppository 650 mg (has no administration in time range)  traZODone (DESYREL) tablet 25 mg (has no administration in time range)  magnesium hydroxide (MILK OF MAGNESIA) suspension 30 mL (has no administration in time range)  ondansetron (ZOFRAN) tablet 4 mg (has no administration in time range)    Or  ondansetron (ZOFRAN) injection 4 mg (has no administration in time range)  iohexol (OMNIPAQUE) 350 MG/ML injection 75 mL (75 mLs Intravenous Contrast Given 08/28/21 2127)     IMPRESSION / MDM / ASSESSMENT AND PLAN / ED COURSE  I reviewed the triage vital signs and the nursing notes.                              Differential diagnosis includes, but is not limited to, cva, tia, hypoglycemia, dehydration, electrolyte abnormality, dissection, sepsis  Patient presented to the ER for evaluation of symptoms as described above.  This presenting complaint could reflect a potentially life-threatening illness therefore the patient will be placed on continuous pulse oximetry and telemetry for monitoring.  Laboratory evaluation will be sent to evaluate for the above complaints.  Patient was made code stroke out of triage but patient is outside of the window therefore code stroke canceled.  She is Zenaida Niece negative.  CT  imaging ordered for above differential.  Glucose normal.  She is well-perfused.  Denies any pain or discomfort.   Clinical Course as of 08/28/21 2325  Wed Aug 28, 2021  2014 CT head on my review and interpretation does not show any evidence of large bleed or edema.  Will await formal radiology report. [PR]  2117 Blood work is reassuring.  Given her acute weakness concerning for CVA I do feel the admission the hospital clinically indicated.  I have consulted hospitalist for admission. [PR]  Clinical Course User Index [PR] Willy Eddy, MD     FINAL CLINICAL IMPRESSION(S) / ED DIAGNOSES   Final diagnoses:  Cerebrovascular accident (CVA), unspecified mechanism (HCC)     Rx / DC Orders   ED Discharge Orders     None        Note:  This document was prepared using Dragon voice recognition software and may include unintentional dictation errors.    Willy Eddy, MD 08/28/21 2325

## 2021-08-29 ENCOUNTER — Observation Stay: Payer: Medicare HMO

## 2021-08-29 ENCOUNTER — Observation Stay (HOSPITAL_COMMUNITY)
Admit: 2021-08-29 | Discharge: 2021-08-29 | Disposition: A | Discharge status: Home / Self Care | Payer: Medicare HMO | Attending: Family Medicine | Admitting: Family Medicine

## 2021-08-29 DIAGNOSIS — Z79899 Other long term (current) drug therapy: Secondary | ICD-10-CM | POA: Diagnosis not present

## 2021-08-29 DIAGNOSIS — I639 Cerebral infarction, unspecified: Secondary | ICD-10-CM

## 2021-08-29 DIAGNOSIS — D509 Iron deficiency anemia, unspecified: Secondary | ICD-10-CM | POA: Diagnosis present

## 2021-08-29 DIAGNOSIS — I160 Hypertensive urgency: Secondary | ICD-10-CM | POA: Diagnosis present

## 2021-08-29 DIAGNOSIS — E785 Hyperlipidemia, unspecified: Secondary | ICD-10-CM | POA: Diagnosis present

## 2021-08-29 DIAGNOSIS — E039 Hypothyroidism, unspecified: Secondary | ICD-10-CM | POA: Diagnosis present

## 2021-08-29 DIAGNOSIS — Z90710 Acquired absence of both cervix and uterus: Secondary | ICD-10-CM | POA: Diagnosis not present

## 2021-08-29 DIAGNOSIS — Z8679 Personal history of other diseases of the circulatory system: Secondary | ICD-10-CM | POA: Diagnosis not present

## 2021-08-29 DIAGNOSIS — R29702 NIHSS score 2: Secondary | ICD-10-CM | POA: Diagnosis present

## 2021-08-29 DIAGNOSIS — Z20822 Contact with and (suspected) exposure to covid-19: Secondary | ICD-10-CM | POA: Diagnosis present

## 2021-08-29 DIAGNOSIS — I6381 Other cerebral infarction due to occlusion or stenosis of small artery: Secondary | ICD-10-CM | POA: Diagnosis present

## 2021-08-29 DIAGNOSIS — Z79890 Hormone replacement therapy (postmenopausal): Secondary | ICD-10-CM | POA: Diagnosis not present

## 2021-08-29 DIAGNOSIS — I6389 Other cerebral infarction: Secondary | ICD-10-CM | POA: Diagnosis not present

## 2021-08-29 DIAGNOSIS — F17210 Nicotine dependence, cigarettes, uncomplicated: Secondary | ICD-10-CM | POA: Diagnosis present

## 2021-08-29 DIAGNOSIS — E876 Hypokalemia: Secondary | ICD-10-CM | POA: Diagnosis present

## 2021-08-29 DIAGNOSIS — R29704 NIHSS score 4: Secondary | ICD-10-CM | POA: Diagnosis not present

## 2021-08-29 DIAGNOSIS — R29810 Facial weakness: Secondary | ICD-10-CM | POA: Diagnosis present

## 2021-08-29 DIAGNOSIS — I10 Essential (primary) hypertension: Secondary | ICD-10-CM | POA: Diagnosis present

## 2021-08-29 DIAGNOSIS — G319 Degenerative disease of nervous system, unspecified: Secondary | ICD-10-CM | POA: Diagnosis not present

## 2021-08-29 DIAGNOSIS — R29703 NIHSS score 3: Secondary | ICD-10-CM | POA: Diagnosis not present

## 2021-08-29 DIAGNOSIS — D649 Anemia, unspecified: Secondary | ICD-10-CM | POA: Diagnosis not present

## 2021-08-29 DIAGNOSIS — K5901 Slow transit constipation: Secondary | ICD-10-CM | POA: Diagnosis not present

## 2021-08-29 DIAGNOSIS — L930 Discoid lupus erythematosus: Secondary | ICD-10-CM | POA: Diagnosis present

## 2021-08-29 DIAGNOSIS — G8314 Monoplegia of lower limb affecting left nondominant side: Secondary | ICD-10-CM | POA: Diagnosis present

## 2021-08-29 DIAGNOSIS — D696 Thrombocytopenia, unspecified: Secondary | ICD-10-CM | POA: Diagnosis present

## 2021-08-29 LAB — BASIC METABOLIC PANEL
Anion gap: 8 (ref 5–15)
BUN: 8 mg/dL (ref 8–23)
CO2: 24 mmol/L (ref 22–32)
Calcium: 8.6 mg/dL — ABNORMAL LOW (ref 8.9–10.3)
Chloride: 111 mmol/L (ref 98–111)
Creatinine, Ser: 0.78 mg/dL (ref 0.44–1.00)
GFR, Estimated: >60 mL/min (ref >60)
Glucose, Bld: 93 mg/dL (ref 70–99)
Potassium: 3.5 mmol/L (ref 3.5–5.1)
Sodium: 143 mmol/L (ref 135–145)

## 2021-08-29 LAB — CBC
HCT: 32.5 % — ABNORMAL LOW (ref 36.0–46.0)
Hemoglobin: 10.6 g/dL — ABNORMAL LOW (ref 12.0–15.0)
MCH: 25.5 pg — ABNORMAL LOW (ref 26.0–34.0)
MCHC: 32.6 g/dL (ref 30.0–36.0)
MCV: 78.1 fL — ABNORMAL LOW (ref 80.0–100.0)
Platelets: 126 10*3/uL — ABNORMAL LOW (ref 150–400)
RBC: 4.16 MIL/uL (ref 3.87–5.11)
RDW: 15.4 % (ref 11.5–15.5)
WBC: 4.7 10*3/uL (ref 4.0–10.5)
nRBC: 0 % (ref 0.0–0.2)

## 2021-08-29 LAB — ECHOCARDIOGRAM COMPLETE BUBBLE STUDY
AR max vel: 2.82 cm2
AV Area VTI: 3.14 cm2
AV Area mean vel: 2.84 cm2
AV Mean grad: 5 mmHg
AV Peak grad: 9.4 mmHg
Ao pk vel: 1.53 m/s
Area-P 1/2: 2.15 cm2
S' Lateral: 2.89 cm

## 2021-08-29 LAB — LIPID PANEL
Cholesterol: 137 mg/dL (ref 0–200)
HDL: 34 mg/dL — ABNORMAL LOW (ref >40)
LDL Cholesterol: 90 mg/dL (ref 0–99)
Total CHOL/HDL Ratio: 4 RATIO
Triglycerides: 63 mg/dL (ref <150)
VLDL: 13 mg/dL (ref 0–40)

## 2021-08-29 LAB — HEMOGLOBIN A1C
Hgb A1c MFr Bld: 5.7 % — ABNORMAL HIGH (ref 4.8–5.6)
Mean Plasma Glucose: 116.89 mg/dL

## 2021-08-29 MED ORDER — CLOPIDOGREL BISULFATE 75 MG PO TABS
75.0000 mg | ORAL_TABLET | Freq: Every day | ORAL | Status: DC
  Administered 2021-08-30 – 2021-09-02 (×4): 75 mg via ORAL
  Filled 2021-08-29 (×4): qty 1

## 2021-08-29 MED ORDER — CLOPIDOGREL BISULFATE 75 MG PO TABS
300.0000 mg | ORAL_TABLET | Freq: Once | ORAL | Status: AC
  Administered 2021-08-29: 300 mg via ORAL
  Filled 2021-08-29: qty 4

## 2021-08-29 MED ORDER — ROSUVASTATIN CALCIUM 20 MG PO TABS: 20.0000 mg | ORAL_TABLET | Freq: Every day | ORAL | Status: DC

## 2021-08-29 MED ORDER — ASPIRIN 81 MG PO TBEC
81.0000 mg | DELAYED_RELEASE_TABLET | Freq: Every day | ORAL | Status: DC
  Administered 2021-08-29 – 2021-09-02 (×5): 81 mg via ORAL
  Filled 2021-08-29 (×5): qty 1

## 2021-08-29 NOTE — Evaluation (Signed)
Physical Therapy Evaluation Patient Details Name: Susan Farrell MRN: [id number] DOB: 1945/02/12 Today's Date: 08/29/2021  History of Present Illness  Pt is a 76 y.o. female presenting to hospital 08/28/21 with c/o L LE weakness.  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  Pt admitted with CVA.  PMH includes discoid lupus, htn, dyslipidemia, hypothyroidism, AAA (s/p rupture and repair), tobacco abuse.  Clinical Impression  Prior to hospital admission, pt was independent with functional mobility; lives with her husband in 1 level home with 2 steps to enter.  Pt noted with L UE and L LE weakness (see below for strength details) and increased L LE tone noted.  No c/o pain during session.  Currently pt is SBA semi-supine to sitting edge of bed; mod assist with transfers using RW; and min to mod assist to ambulate a few feet with RW (cueing/assist required for balance and to shift to R to advance L LE).  Pt noted with posterior L lean in standing activities requiring cueing and assist to correct.  Pt incontinent of urine in standing (unable to get to [facility] in time) requiring clean-up during session.  Pt appearing very motivated to participate in therapy and pt's husband appearing very supportive.  Pt would benefit from skilled PT to address noted impairments and functional limitations (see below for any additional details).  Upon hospital discharge, pt would benefit from inpatient rehab.    Recommendations for follow up therapy are one component of a multi-disciplinary discharge planning process, led by the attending physician.  Recommendations may be updated based on patient status, additional functional criteria and insurance authorization.  Follow Up Recommendations Acute inpatient rehab (3hours/day)      Assistance Recommended at Discharge Frequent or constant Supervision/Assistance  Patient can return home with the following  A lot of help with walking and/or  transfers;A lot of help with bathing/dressing/bathroom;Assistance with cooking/housework;Assist for transportation;Help with stairs or ramp for entrance    Equipment Recommendations Rolling walker (2 wheels);BSC/3in1  Recommendations for Other Services  OT consult;Rehab consult    Functional Status Assessment Patient has had a recent decline in their functional status and demonstrates the ability to make significant improvements in function in a reasonable and predictable amount of time.     Precautions / Restrictions Precautions Precautions: Fall Restrictions Weight Bearing Restrictions: No      Mobility  Bed Mobility Overal bed mobility: Needs Assistance Bed Mobility: Supine to Sit     Supine to sit: Supervision, HOB elevated     General bed mobility comments: increased effort/time to perform on own    Transfers Overall transfer level: Needs assistance Equipment used: Rolling walker (2 wheels) Transfers: Sit to/from Stand Sit to Stand: Mod assist           General transfer comment: x2 trials standing from bed; vc's for UE/LE placement (pt using B UE's to move L LE into place); assist to initiate stand up to RW and control descent sitting    Ambulation/Gait Ambulation/Gait assistance: Min assist, Mod assist Gait Distance (Feet):  (pt took a couple steps forward (limited d/t urinary incontinence) and then (after sitting for clean up) pt ambulated a few feet bed to recliner with RW use) Assistive device: Rolling walker (2 wheels)   Gait velocity: decreased     General Gait Details: vc's to shift weight to R to advance L LE (pt with increased effort/time to advance L LE shorter distance); vc's for upright positioning (d/t posterior  L lean); vc's for walker use and overall technique  Stairs            Wheelchair Mobility    Modified Rankin (Stroke Patients Only)       Balance Overall balance assessment: Needs assistance Sitting-balance support: No  upper extremity supported, Feet supported Sitting balance-Leahy Scale: Good Sitting balance - Comments: steady sitting reaching within BOS to assist with washing B LE's   Standing balance support: Bilateral upper extremity supported Standing balance-Leahy Scale: Poor Standing balance comment: pt with posterior L lean in standing using RW requiring cueing and assist for balance                             Pertinent Vitals/Pain Pain Assessment Pain Assessment: No/denies pain Vitals (HR and O2 on room air) stable and WFL throughout treatment session.    Home Living Family/patient expects to be discharged to:: Private residence Living Arrangements: Spouse/significant other Available Help at Discharge: Family;Available 24 hours/day Type of Home: House Home Access: Stairs to enter Entrance Stairs-Rails: None Entrance Stairs-Number of Steps: 2   Home Layout: One level Home Equipment: Shower seat - built in      Prior Function Prior Level of Function : Independent/Modified Independent             Mobility Comments: No recent falls.       Hand Dominance   Dominant Hand: Right    Extremity/Trunk Assessment   Upper Extremity Assessment Upper Extremity Assessment: RUE deficits/detail;LUE deficits/detail (R UE WFL) LUE Deficits / Details: fair to good L hand grip strength; at least 4/5 elbow flexion; at least 4-/5 elbow extension; L shoulder flexion AAROM limited to grossly 120 degrees and at least 2+/5 strength    Lower Extremity Assessment Lower Extremity Assessment: RLE deficits/detail;LLE deficits/detail (R LE strength WFL) LLE Deficits / Details: hip flexion 2+/5; knee extension 4-/5; knee flexion 3+/5; DF 2/5; intact proprioception and light touch; increased L LE tone noted; limited coordination d/t weakness    Cervical / Trunk Assessment Cervical / Trunk Assessment: Normal  Communication   Communication: No difficulties  Cognition Arousal/Alertness:  Awake/alert Behavior During Therapy: WFL for tasks assessed/performed Overall Cognitive Status: Within Functional Limits for tasks assessed                                          General Comments  Nursing cleared pt for participation in physical therapy.  Pt agreeable to PT session.  Pt's husband present during session.    Exercises  Transfer and gait training   Assessment/Plan    PT Assessment Patient needs continued PT services  PT Problem List Decreased strength;Decreased balance;Decreased mobility;Decreased coordination;Decreased knowledge of use of DME;Decreased knowledge of precautions       PT Treatment Interventions DME instruction;Gait training;Stair training;Functional mobility training;Therapeutic activities;Therapeutic exercise;Balance training;Neuromuscular re-education;Patient/family education    PT Goals (Current goals can be found in the Care Plan section)  Acute Rehab PT Goals Patient Stated Goal: to improve mobility and strength PT Goal Formulation: With patient Time For Goal Achievement: 09/12/21 Potential to Achieve Goals: Good    Frequency 7X/week     Co-evaluation               AM-PAC PT "6 Clicks" Mobility  Outcome Measure Help needed turning from your back to your side while in a flat  bed without using bedrails?: A Little Help needed moving from lying on your back to sitting on the side of a flat bed without using bedrails?: A Little Help needed moving to and from a bed to a chair (including a wheelchair)?: A Lot Help needed standing up from a chair using your arms (e.g., wheelchair or bedside chair)?: A Lot Help needed to walk in hospital room?: A Lot Help needed climbing 3-5 steps with a railing? : Total 6 Click Score: 13    End of Session Equipment Utilized During Treatment: Gait belt Activity Tolerance: Patient tolerated treatment well Patient left: in chair;with call bell/phone within reach;with chair alarm set;with  family/visitor present Nurse Communication: Mobility status;Precautions PT Visit Diagnosis: Other abnormalities of gait and mobility (R26.89);Hemiplegia and hemiparesis Hemiplegia - Right/Left: Left Hemiplegia - caused by: Cerebral infarction    Time: 5102-5852 PT Time Calculation (min) (ACUTE ONLY): 36 min   Charges:   PT Evaluation $PT Eval Low Complexity: 1 Low PT Treatments $Gait Training: 8-22 mins $Therapeutic Activity: 8-22 mins       Hendricks Limes, PT 08/29/21, 10:10 AM

## 2021-08-29 NOTE — Progress Notes (Signed)
  Inpatient Rehab Admissions Coordinator :  Per therapy recommendations, patient was screened for CIR candidacy by Ottie Glazier RN MSN.  At this time patient appears to be a potential candidate for CIR. I will place a rehab consult per protocol for full assessment by one of our Admission Coordinators. Please call me with any questions.  Ottie Glazier RN MSN Admissions Coordinator [phone]

## 2021-08-29 NOTE — Consult Note (Signed)
Neurology Consultation Reason for Consult: Stroke Referring Physician: Wilfred Lacy  CC: Left-sided weakness  History is obtained from: Patient, family  HPI: Susan Farrell is a 76 y.o. female with a history of hypertension, hyperlipidemia, hypothyroidism who presents with left-sided weakness that started yesterday.  She states that she was last up and around around 11, and then sat down and was watching TV.  When she tried to stand up again, she noticed her left leg was weak.  She finally presented to the emergency department yesterday evening, but was outside the window for tenecteplase at that time.   LKW: 11 AM tpa given?: no, outside of window    Past medical history: Hypertension Hyperlipidemia Thyroid disease Aortic aneurysm repair Tobacco abuse   Family History  Problem Relation Age of Onset   Breast cancer Neg Hx      Social History:  reports that she has been smoking cigarettes. She uses smokeless tobacco. She reports that she does not currently use alcohol. She reports that she does not use drugs.   Exam: Current vital signs: BP (!) 135/92 (BP Location: Right Arm)   Pulse (!) 56   Temp 99 F (37.2 C)   Resp 15   Ht 5\' 6"  (1.676 m)   Wt 72.1 kg   SpO2 100%   BMI 25.66 kg/m  Vital signs in last 24 hours: Temp:  [98 F (36.7 C)-99 F (37.2 C)] 99 F (37.2 C) (08/10 1144) Pulse Rate:  [56-69] 56 (08/10 1144) Resp:  [15-24] 15 (08/10 1144) BP: (135-184)/(71-96) 135/92 (08/10 1144) SpO2:  [95 %-100 %] 100 % (08/10 1144) Weight:  [72.1 kg] 72.1 kg (08/09 1954)   Physical Exam  Constitutional: Appears well-developed and well-nourished.  Psych: Affect appropriate to situation Eyes: No scleral injection HENT: No OP obstruction MSK: no joint deformities.  Cardiovascular: Normal rate and regular rhythm.  Respiratory: Effort normal, non-labored breathing GI: Soft.  No distension. There is no tenderness.  Skin: WDI  Neuro: Mental Status: Patient is  awake, alert, oriented to person, place, month, year, and situation. Patient is able to give a clear and coherent history. No signs of aphasia or neglect Cranial Nerves: II: Visual Fields are full. Pupils are equal, round, and reactive to light.   III,IV, VI: EOMI without ptosis or diploplia.  V: Facial sensation is symmetric to temperature VII: Facial movement with mild left facial weakness VIII: hearing is intact to voice X: Uvula elevates symmetrically XI: Shoulder shrug is symmetric. XII: tongue is midline without atrophy or fasciculations.  Motor: Tone is normal. Bulk is normal. 5/5 strength was present on the right, on the left she has 4/5 weakness of the left upper extremity with discoordination at proportion to weakness, and 2/5 weakness of the left lower extremity Sensory: Sensation is symmetric to light touch and temperature in the arms and legs. Cerebellar: Discoordination out of proportion to weakness in the left arm     I have reviewed labs in epic and the results pertinent to this consultation are: Creatinine 0.78 A1c 5.7  I have reviewed the images obtained: CTA head and neck-no significant stenosis MRI brain-small vessel appearing stroke in the right basal ganglia  Impression: 76 year old female with a right basal ganglia stroke, which I suspect is small vessel disease in etiology.  I strongly encouraged her to stop smoking.  Recommendations: 1) more aggressive statin therapy, rosuvastatin 20 mg nightly 2) aspirin 81 mg daily and Plavix 85 mg daily followed by aspirin monotherapy 3) smoking  cessation 4) PT, OT, ST 5) neurology will be available as needed.   Ritta Slot, MD [facility] [phone]  If 7pm- 7am, please page neurology on call as listed in [facility].

## 2021-08-29 NOTE — Evaluation (Signed)
Occupational Therapy Evaluation Patient Details Name: Susan Farrell MRN: [id number] DOB: [birth year] Today's Date: 08/29/2021   History of Present Illness Pt is a 76 y.o. female presenting to hospital 08/28/21 with c/o L LE weakness.  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  Pt admitted with CVA.  PMH includes discoid lupus, htn, dyslipidemia, hypothyroidism, AAA (s/p rupture and repair), tobacco abuse.   Clinical Impression   Patient seen for OT evaluation this date. Patient presenting with L sided residual deficits including weakness, FM coordination, balance, and AROM impacting safety and independence in ADLs. At baseline, pt was independent in ADLs, IADLs, and functional mobility without an AD. Pt lives with husband and has two sisters who live nearby. Pt currently functioning at Mod A x2 with RW for functional mobility, several instances of L knee buckling. Pt required set up A to complete sinkside grooming tasks with L knee blocking, Mod A to complete bed mobility 2/2 LLE weakness, and Max A for LB dressing. Pt required VC throughout evaluation to maintain wide BOS and weightshift to lift L foot off of floor. Patient will benefit from acute OT to increase overall independence in the areas of ADLs and functional mobility. OT recommends AIR upon discharge due to pt motivation and rehab potential.      Recommendations for follow up therapy are one component of a multi-disciplinary discharge planning process, led by the attending physician.  Recommendations may be updated based on patient status, additional functional criteria and insurance authorization.   Follow Up Recommendations  Acute inpatient rehab (3hours/day)    Assistance Recommended at Discharge Frequent or constant Supervision/Assistance  Patient can return home with the following A lot of help with walking and/or transfers;A lot of help with bathing/dressing/bathroom;Assistance with  cooking/housework;Assist for transportation    Functional Status Assessment  Patient has had a recent decline in their functional status and demonstrates the ability to make significant improvements in function in a reasonable and predictable amount of time.  Equipment Recommendations  Other (comment) (defer to next venue of care)           Precautions / Restrictions Precautions Precautions: Fall Restrictions Weight Bearing Restrictions: No      Mobility Bed Mobility Overal bed mobility: Needs Assistance Bed Mobility: Supine to Sit, Sit to Supine     Supine to sit: Supervision, HOB elevated Sit to supine: Mod assist   General bed mobility comments: with VC and increased time to complete, Mod A for sit to supine 2/2 LLE weakness Patient Response: Cooperative  Transfers Overall transfer level: Needs assistance Equipment used: Rolling walker (2 wheels) Transfers: Sit to/from Stand Sit to Stand: Mod assist           General transfer comment: Pt completed STS from EOB with Mod A x1 + RW. Posterior LOB upon standing, required assistance to correct.      Balance Overall balance assessment: Needs assistance Sitting-balance support: Bilateral upper extremity supported, Feet supported Sitting balance-Leahy Scale: Fair Sitting balance - Comments: pt required intermittent Min A to maintain static sitting balance at EOB, posterior L lean   Standing balance support: Reliant on assistive device for balance, During functional activity Standing balance-Leahy Scale: Poor Standing balance comment: Pt with posterior L lean in standing using RW requiring cueing and assist for balance. Pt benefitted from visual cue of mirror at sink to return to midline. Required Mod A x2 + RW while standing at sink with L knee blocking.  ADL either performed or assessed with clinical judgement   ADL Overall ADL's : Needs assistance/impaired     Grooming: Oral  care;Wash/dry hands;Standing;Moderate assistance Grooming Details (indicate cue type and reason): Pt completed grooming tasks standing at sink with Mod A x2 + RW and L knee blocking             Lower Body Dressing: Maximal assistance;Bed level Lower Body Dressing Details (indicate cue type and reason): Max A to doff socks at bed level             Functional mobility during ADLs: Moderate assistance;+2 for physical assistance;Rolling walker (2 wheels) General ADL Comments: Pt completed functional mobility at room level with Mod A x2 + RW. Pt demonstrated difficulty with weightshifting and dragging L foot behind, required VC to maintain wide BOS.     Vision Baseline Vision/History: 0 No visual deficits Patient Visual Report: No change from baseline Vision Assessment?: No apparent visual deficits Additional Comments: visual pursuits: WFL, able to track stimulus                Pertinent Vitals/Pain Pain Assessment Pain Assessment: No/denies pain     Hand Dominance Right   Extremity/Trunk Assessment Upper Extremity Assessment Upper Extremity Assessment: LUE deficits/detail (RUE WFL) LUE Deficits / Details: Shoulder flexion AROM limited to ~90 deg and grossly 120 deg for AAROM. MMT: elbow flexion grossly 4/5, elbow extension grossly 4-/5, grip strength fair. LUE Sensation: WNL (intact to light touch, hot/cold discrimination WFL (able to feel warm water when washing hands)) LUE Coordination: decreased fine motor (decreased speed, dysmetria with finger to nose testing (undershooting), thumb opposition intact)   Lower Extremity Assessment Lower Extremity Assessment: Defer to PT evaluation   Cervical / Trunk Assessment Cervical / Trunk Assessment: Normal   Communication Communication Communication: No difficulties   Cognition Arousal/Alertness: Awake/alert Behavior During Therapy: WFL for tasks assessed/performed Overall Cognitive Status: Within Functional Limits for  tasks assessed         General Comments: A&Ox4, able to follow commands, great attention to task     General Comments  Pt reports L side feels "heavy"               Home Living Family/patient expects to be discharged to:: Private residence Living Arrangements: Spouse/significant other Available Help at Discharge: Family;Available 24 hours/day Type of Home: House Home Access: Stairs to enter [employer] of Steps: 2 Entrance Stairs-Rails: None Home Layout: One level     Bathroom Shower/Tub: Producer, television/film/video: Handicapped height     Home Equipment: Information systems manager - built in      Lives With: Spouse    Prior Functioning/Environment Prior Level of Function : Independent/Modified Independent             Mobility Comments: Independent with no AD. No recent falls. ADLs Comments: Independent. Pt and spouse retired, has 2 sisters close by, still drives.        OT Problem List: Decreased strength;Increased edema;Decreased range of motion;Decreased coordination;Decreased activity tolerance;Decreased cognition;Impaired balance (sitting and/or standing)      OT Treatment/Interventions: Self-care/ADL training;Therapeutic exercise;Patient/family education;Balance training;Energy conservation;Therapeutic activities;Cognitive remediation/compensation;DME and/or AE instruction    OT Goals(Current goals can be found in the care plan section) Acute Rehab OT Goals Patient Stated Goal: get stronger OT Goal Formulation: With patient/family Time For Goal Achievement: 09/12/21 Potential to Achieve Goals: Good ADL Goals Pt Will Perform Grooming: Independently;standing Pt Will Perform Upper Body Dressing: Independently;sitting Pt Will Perform Lower  Body Dressing: Independently;sitting/lateral leans Pt Will Transfer to Toilet: Independently;regular height toilet;grab bars Pt Will Perform Toileting - Clothing Manipulation and hygiene: Independently;sit to/from  stand  OT Frequency: Min 4X/week                  AM-PAC OT "6 Clicks" Daily Activity     Outcome Measure Help from another person eating meals?: None Help from another person taking care of personal grooming?: A Little Help from another person toileting, which includes using toliet, bedpan, or urinal?: A Lot Help from another person bathing (including washing, rinsing, drying)?: A Lot Help from another person to put on and taking off regular upper body clothing?: A Little Help from another person to put on and taking off regular lower body clothing?: A Lot 6 Click Score: 16   End of Session Equipment Utilized During Treatment: Gait belt;Rolling walker (2 wheels) Nurse Communication: Mobility status  Activity Tolerance: Patient tolerated treatment well Patient left: in bed;with call bell/phone within reach;with family/visitor present;with bed alarm set  OT Visit Diagnosis: Unsteadiness on feet (R26.81);Muscle weakness (generalized) (M62.81);Cognitive communication deficit (R41.841);Hemiplegia and hemiparesis Symptoms and signs involving cognitive functions:  (acute ischemic nonhemorrhagic right basal ganglia infarct) Hemiplegia - Right/Left: Left Hemiplegia - dominant/non-dominant: Non-Dominant Hemiplegia - caused by:  (acute ischemic nonhemorrhagic right basal ganglia infarct)                Time: 2263-3354 OT Time Calculation (min): 26 min Charges:  OT General Charges $OT Visit: 1 Visit OT Evaluation $OT Eval Low Complexity: 1 Low  Tigerlily Christine MS, OTR/L ascom [phone]  08/29/21, 3:22 PM

## 2021-08-29 NOTE — Evaluation (Signed)
Speech Language Pathology Evaluation Patient Details Name: Susan Farrell MRN: [id number] DOB: 1945-12-03 Today's Date: 08/29/2021 Time: 0850-0905 SLP Time Calculation (min) (ACUTE ONLY): 15 min  Problem List:  Patient Active Problem List   Diagnosis Date Noted   Hypertensive urgency 08/29/2021   Dyslipidemia 08/29/2021   Hypothyroidism 08/29/2021   CVA (cerebral vascular accident) (HCC) 08/28/2021   Ruptured abdominal aortic aneurysm (AAA) (HCC) 06/12/2020   Anemia 09/13/2019   Goiter 09/13/2019   Osteopenia 09/13/2019   Past Medical History: No past medical history on file. Past Surgical History:  Past Surgical History:  Procedure Laterality Date   ABDOMINAL HYSTERECTOMY     CHOLECYSTECTOMY     ENDOVASCULAR REPAIR/STENT GRAFT N/A 06/12/2020   Procedure: ENDOVASCULAR REPAIR/STENT GRAFT;  Surgeon: Renford Dills, MD;  Location: [facility] INVASIVE CV LAB;  Service: Cardiovascular;  Laterality: N/A;   HPI:  Per H&P "Susan Farrell is a 76 y.o. African-American female with medical history significant for discoid lupus and hypertension, dyslipidemia, hypothyroidism, abdominal aortic aneurysm, s/p rupture and repair, and tobacco abuse, who presented to the emergency room with acute onset of left lower extremity weakness that started around 1 PM today with no paresthesias or slurred speech.  She denies any headache or dizziness or blurred vision.  No tinnitus or vertigo.  No urinary or stool incontinence.  No other focal deficits.  No dysuria, oliguria or hematuria or flank pain.  No nausea or vomiting or abdominal pain.  No chest pain or palpitations.  No cough or wheezing or orthopnea or paroxysmal nocturnal dyspnea.    ED Course: Upon presentation to the emergency room, BP was 184/96 with otherwise normal vital signs.  Labs are unremarkable CMP.  CBC showed hemoglobin 10.9 hematocrit 33.5 slightly lower than previous levels with platelets of 149 bilirubin previous levels.  Informed  antigens and COVID-19 PCR [place] negative.  UA was unremarkable.  Urine drug screen came back negative.  EKG as reviewed by me : EKG showed sinus rhythm rate of 65 with probable left atrial enlargement and LVH.  Imaging: Portable chest x-ray showed mild congestive heart failure and interstitial edema.  Code stroke head CT scan revealed no acute intracranial normalities.  It showed chronic right basal ganglia lacunar infarct.  Head and neck CTA revealed the following:  1. The common carotid, internal carotid and vertebral arteries are  patent within the neck. Mild atherosclerotic narrowing at the origin  of the left vertebral artery.  2. Aortic Atherosclerosis (ICD10-I70.0).  3. 2.7 cm left thyroid lobe nodule. This nodule was previously  evaluated by ultrasound on 02/08/2013, and fine-needle aspiration  was recommended at that time. Please refer to this prior report for  further details.  4. Cervical spondylosis. Most notably, a posterior disc osteophyte  complex contributes to suspected severe spinal canal stenosis at  C4-C5. Consider a cervical spine MRI to assess for spinal cord  impingement at this level, when clinically appropriate.     CTA head:     1. No intracranial large vessel occlusion or proximal high-grade  arterial stenosis is identified.  2. 1-2 mm ventrally projecting vascular protrusion arising from the  cavernous right ICA, suspicious for an aneurysm.     The patient will be admitted to an observation medical telemetry bed for further evaluation and management."  MRI "1. 1 cm acute ischemic nonhemorrhagic right basal ganglia infarct. 2. Underlying age-related cerebral atrophy with mild chronic small vessel ischemic disease, with additional chronic right basal ganglia lacunar infarct."  Assessment /  Plan / Recommendation Clinical Impression  Pt seen for cognitive-linguistic evaluation. Pt alert, pleasant, and cooperative. Denies changes to speech/language/cognition. Husband present.   Pt  assessed via informal means and portions of Cognistat. Pt demonstrated functional basic cognitive-linguistic ability including attention, memory (immediate, short term, working), verbal problem solving, abstract reasoning, and insight. Pt's speech is fluent, appropriate, and without s/sx anomia or dysarthria.   Suspect pt is at or near baseline cognitive-linguistic functioning. SLP to sign off at this time as pt has no acute SLP needs.   Pt, pt's husband, and RN made aware of results, recommendations, and SLP POC. Pt and husband verbalized understanding/agreement.     SLP Assessment  SLP Recommendation/Assessment: Patient does not need any further Speech Lanaguage Pathology Services SLP Visit Diagnosis: Cognitive communication deficit (R41.841)    Recommendations for follow up therapy are one component of a multi-disciplinary discharge planning process, led by the attending physician.  Recommendations may be updated based on patient status, additional functional criteria and insurance authorization.    Follow Up Recommendations  No SLP follow up    Assistance Recommended at Discharge  PRN (defer to OT/PT given physical debility)  Functional Status Assessment Patient has not had a recent decline in their functional status  Frequency and Duration           SLP Evaluation Cognition  Overall Cognitive Status: Within Functional Limits for tasks assessed Arousal/Alertness: Awake/alert Orientation Level: Oriented X4 Attention:  (WFL) Memory: Appears intact Awareness: Appears intact Problem Solving: Appears intact Executive Function: Reasoning Reasoning: Appears intact Safety/Judgment: Appears intact       Comprehension  Auditory Comprehension Overall Auditory Comprehension: Appears within functional limits for tasks assessed Yes/No Questions: Within Functional Limits Commands: Within Functional Limits Conversation: Simple [facility]) Visual Recognition/Discrimination Discrimination: Not  tested Reading Comprehension Reading Status: Not tested    Expression Expression Primary Mode of Expression: Verbal Verbal Expression Overall Verbal Expression: Appears within functional limits for tasks assessed Initiation: No impairment Automatic Speech:  (social automatics; WFL) Level of Generative/Spontaneous Verbalization: Conversation (WFL) Pragmatics: No impairment Written Expression Dominant Hand: Right Written Expression: Not tested   Oral / Motor  Oral Motor/Sensory Function Overall Oral Motor/Sensory Function: Within functional limits Motor Speech Overall Motor Speech: Appears within functional limits for tasks assessed Respiration: Within functional limits Phonation: Normal Resonance: Within functional limits Articulation: Within functional limitis Intelligibility: Intelligible Motor Planning: Witnin functional limits           Clyde Canterbury, M.S., CCC-SLP Speech-Language Pathologist [facility] [place] [facility] [phone] (ASCOM)   Woodroe Chen 08/29/2021, 9:35 AM

## 2021-08-29 NOTE — Assessment & Plan Note (Signed)
-   The patient will be admitted to a medical telemetry observation bed. - She presented with left lower extremity weakness. - Will obtain a brain MRI without contrast. - She will be placed on baby aspirin for now.  Given the suspicion about right ICA aneurysm we will hold off on Plavix. - 2D echo with bubble study will be obtained. - Neurology consult will be obtained. - Dr. Amada Jupiter was notified and is aware about the patient. - PT/OT and ST consults will be obtained

## 2021-08-29 NOTE — Progress Notes (Signed)
*  PRELIMINARY RESULTS* Echocardiogram 2D Echocardiogram has been performed.  Joanette Gula Bellatrix Devonshire 08/29/2021, 11:07 AM

## 2021-08-29 NOTE — Assessment & Plan Note (Signed)
-   We will continue antihypertensives for now with permissive hypertension with parameters.

## 2021-08-29 NOTE — Progress Notes (Addendum)
  Inpatient Rehabilitation Admissions Coordinator   I spoke with patient by phone for rehab assessment. We discussed goals and expectations of a possible CIR admit. She gave me permission to contact her spouse to also discuss rehab venue options. I left him a voicemail and await his return call. She would like to further discuss with her husband before deciding preference for rehab venue. I await further discussions with patient and her spouse. I will follow up. Please call me with any questions.   Ottie Glazier, RN, MSN Rehab Admissions Coordinator [phone]  I spoke with spouse by phone and he will discuss with his wife tonight.  Ottie Glazier, RN, MSN Rehab Admissions Coordinator [phone] 08/29/2021 4:21 PM

## 2021-08-29 NOTE — Assessment & Plan Note (Signed)
-   Will continue Synthroid.

## 2021-08-29 NOTE — Progress Notes (Signed)
PROGRESS NOTE    Susan FLATEN  [id number] DOB: October 28, 1945 DOA: 08/28/2021 PCP: Tracie Harrier, MD  Assessment & Plan:   Principal Problem:   CVA (cerebral vascular accident) [facility]) Active Problems:   Hypertensive urgency   Dyslipidemia   Hypothyroidism  Assessment and Plan: CVA: ischemic nonhemorrhagic right basal ganglia infarct as per MRI brain. Continue w/ neuro checks. Continue on aspirin. Echo ordered. PT/OT consulted. Able to swallow, so started on a diet as per speech. Neuro consulted   Hypertensive urgency: urgency resolved but still w/ HTN    HLD: continue on statin    Hypothyroidism: continue on synthroid   Thrombocytopenia: etiology unclear. 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   Consultants:  Neuro   Procedures:   Antimicrobials:    Subjective: Pt c/o left leg weakness  Objective: Vitals:   08/28/21 2250 08/29/21 0022 08/29/21 0618 08/29/21 0758  BP: (!) 174/77 (!) 158/90 (!) 171/75 (!) 160/81  Pulse: 64 (!) 58 (!) 58 64  Resp: 18 (!) 22 16 16   Temp: 98.1 F (36.7 C) 98 F (36.7 C) 98.2 F (36.8 C) 98.1 F (36.7 C)  TempSrc:    Oral  SpO2: 100% 100% 99% 99%  Weight:      Height:       No intake or output data in the 24 hours ending 08/29/21 0802 Filed Weights   08/28/21 1954  Weight: 72.1 kg    Examination:  General exam: Appears calm and comfortable  Respiratory system: Clear to auscultation. Respiratory effort normal. Cardiovascular system: S1 & S2+. No  rubs, gallops or clicks.  Gastrointestinal system: Abdomen  is nondistended, soft and nontender. Normal bowel sounds heard. Central nervous system: Alert and oriented. Moves all extremities Psychiatry: Judgement and insight appear normal. Flat mood and affect.     Data Reviewed: I have personally reviewed following labs and imaging studies  CBC: Recent Labs  Lab 08/28/21 2018 08/29/21 0427  WBC 4.2 4.7  NEUTROABS 2.0  --   HGB 10.9* 10.6*  HCT 33.5* 32.5*  MCV 79.0* 78.1*  PLT 149* [id number]*   Basic Metabolic Panel: Recent Labs  Lab 08/28/21 2018 08/29/21 0427  NA 141 143  K 3.6 3.5  CL 106 111  CO2 27 24  GLUCOSE 112* 93  BUN 10 8  CREATININE 0.99 0.78  CALCIUM 9.2 8.6*   GFR: Estimated Creatinine Clearance: 62.7 mL/min (by C-G formula based on SCr of 0.78 mg/dL). Liver Function Tests: Recent Labs  Lab 08/28/21 2018  AST 21  ALT 10  ALKPHOS 64  BILITOT 0.6  PROT 7.4  ALBUMIN 3.9   No results for input(s): "LIPASE", "AMYLASE" in the last 168 hours. No results for input(s): "AMMONIA" in the last 168 hours. Coagulation Profile: Recent Labs  Lab 08/28/21 2018  INR 1.1   Cardiac Enzymes: No results for input(s): "CKTOTAL", "CKMB", "CKMBINDEX", "TROPONINI" in the last 168 hours. BNP (last 3 results) No results for input(s): "PROBNP" in the last 8760 hours. HbA1C: Recent Labs    08/28/21 2018  HGBA1C 5.7*  CBG: Recent Labs  Lab 08/28/21 1953  GLUCAP 124*   Lipid Profile: Recent Labs    08/29/21 0427  CHOL 137  HDL 34*  LDLCALC 90  TRIG 63  CHOLHDL 4.0   Thyroid Function Tests: No results for input(s): "TSH", "T4TOTAL", "FREET4", "T3FREE", "THYROIDAB" in the last 72 hours. Anemia Panel: No results for input(s): "VITAMINB12", "FOLATE", "FERRITIN", "TIBC", "IRON", "RETICCTPCT" in the last 72 hours. Sepsis Labs: No results for input(s): "PROCALCITON", "LATICACIDVEN" in the last 168 hours.  Recent Results (from the past 240 hour(s))  Resp Panel by RT-PCR (Flu A&B, Covid) Anterior Nasal Swab     Status:  None   Collection Time: 08/28/21  8:18 PM   Specimen: Anterior Nasal Swab  Result Value Ref Range Status   SARS Coronavirus 2 by RT PCR NEGATIVE NEGATIVE Final    Comment: (NOTE) SARS-CoV-2 target nucleic acids are NOT DETECTED.  The SARS-CoV-2 RNA is generally detectable in upper respiratory specimens during the acute phase of infection. The lowest concentration of SARS-CoV-2 viral copies this assay can detect is 138 copies/mL. A negative result does not preclude SARS-Cov-2 infection and should not be used as the sole basis for treatment or other patient management decisions. A negative result may occur with  improper specimen collection/handling, submission of specimen other than nasopharyngeal swab, presence of viral mutation(s) within the areas targeted by this assay, and inadequate number of viral copies(<138 copies/mL). A negative result must be combined with clinical observations, patient history, and epidemiological information. The expected result is Negative.  Fact Sheet for Patients:  BloggerCourse.com  Fact Sheet for Healthcare Providers:  SeriousBroker.it  This test is no t yet approved or cleared by the [place] FDA and  has been authorized for detection and/or diagnosis of SARS-CoV-2 by FDA under an Emergency Use Authorization (EUA). This EUA will remain  in effect (meaning this test can be used) for the duration of the COVID-19 declaration under Section 564(b)(1) of the Act, 21 U.S.C.section 360bbb-3(b)(1), unless the authorization is terminated  or revoked sooner.       Influenza A by PCR NEGATIVE NEGATIVE Final   Influenza B by PCR NEGATIVE NEGATIVE Final    Comment: (NOTE) The Xpert Xpress SARS-CoV-2/FLU/RSV plus assay is intended as an aid in the diagnosis of influenza from Nasopharyngeal swab specimens and should not be used as a sole basis for treatment. Nasal washings and aspirates are unacceptable  for Xpert Xpress SARS-CoV-2/FLU/RSV testing.  Fact Sheet for Patients: BloggerCourse.com  Fact Sheet for Healthcare Providers: SeriousBroker.it  This test is not yet approved or cleared by the [place] FDA and has been authorized for detection and/or diagnosis of SARS-CoV-2 by FDA under an Emergency Use Authorization (EUA). This EUA will remain in effect (meaning this test can be used) for the duration of the COVID-19 declaration under Section 564(b)(1) of the Act, 21 U.S.C. section 360bbb-3(b)(1), unless the authorization is terminated or revoked.  Performed at [facility], [address] [place], [place] [address]          Radiology Studies: MR BRAIN WO CONTRAST  Result Date: 08/29/2021 CLINICAL DATA:  Initial evaluation for acute neuro deficit, stroke suspected. EXAM: MRI HEAD WITHOUT CONTRAST TECHNIQUE: Multiplanar, multiecho pulse sequences of the brain and surrounding structures were obtained without intravenous contrast. COMPARISON:  Prior CT from 08/28/2021. FINDINGS: Brain: Mild age-related cerebral atrophy with chronic small vessel ischemic disease. Remote lacunar infarct involving the anterior right basal ganglia with associated chronic hemosiderin staining noted. 1 cm  acute ischemic nonhemorrhagic infarct present at the posterior right basal ganglia (series 5, image 84). No significant regional mass effect. No other evidence for acute or subacute ischemia. Gray-white matter differentiation otherwise maintained. No other acute or chronic intracranial blood products. No mass lesion, midline shift or mass effect. No hydrocephalus or extra-axial fluid collection. Pituitary gland and suprasellar region within normal limits. Vascular: Major intracranial vascular flow voids are maintained. Skull and upper cervical spine: Craniocervical junction within normal limits. Bone marrow signal intensity normal. No scalp soft  tissue abnormality. Sinuses/Orbits: Globes orbital soft tissues within normal limits. Paranasal sinuses are largely clear. No significant mastoid effusion. Other: None. IMPRESSION: 1. 1 cm acute ischemic nonhemorrhagic right basal ganglia infarct. 2. Underlying age-related cerebral atrophy with mild chronic small vessel ischemic disease, with additional chronic right basal ganglia lacunar infarct. Electronically Signed   By: Jeannine Boga M.D.   On: 08/29/2021 03:24   CT ANGIO HEAD NECK W WO CM (CODE STROKE)  Result Date: 08/28/2021 CLINICAL DATA:  Stroke, follow-up. Additional history provided: Patient unable to lift/move left leg. Symptoms began at 1300 today. Intermittent weakness in left arm. EXAM: CT ANGIOGRAPHY HEAD AND NECK TECHNIQUE: Multidetector CT imaging of the head and neck was performed using the standard protocol during bolus administration of intravenous contrast. Multiplanar CT image reconstructions and MIPs were obtained to evaluate the vascular anatomy. Carotid stenosis measurements (when applicable) are obtained utilizing NASCET criteria, using the distal internal carotid diameter as the denominator. RADIATION DOSE REDUCTION: This exam was performed according to the departmental dose-optimization program which includes automated exposure control, adjustment of the mA and/or kV according to patient size and/or use of iterative reconstruction technique. CONTRAST:  84mL OMNIPAQUE IOHEXOL 350 MG/ML SOLN COMPARISON:  Noncontrast head CT performed earlier today 08/28/2021. FINDINGS: CTA NECK FINDINGS Aortic arch: Common origin of the innominate and left common carotid arteries. Atherosclerotic plaque within the aortic arch. No hemodynamically significant innominate or proximal subclavian artery stenosis. Right carotid system: CCA and ICA patent within the neck without stenosis or significant atherosclerotic disease. Left carotid system: CCA and ICA patent within the neck without stenosis or  significant atherosclerotic disease. Vertebral arteries: Vertebral arteries patent within the neck. Mild atherosclerotic narrowing at the origin of the left vertebral artery. Skeleton: Nonspecific reversal of the expected cervical lordosis. Cervical spondylosis. Most notably at C4-C5, a posterior disc osteophyte complex contributes to suspected severe spinal canal stenosis (series 6, image 157). Other neck: 2.7 cm nodule within the left thyroid lobe. Upper chest: No consolidation within the imaged lung apices. Review of the MIP images confirms the above findings CTA HEAD FINDINGS Anterior circulation: The intracranial internal carotid arteries are patent. Nonstenotic atherosclerotic plaque within both vessels. The M1 middle cerebral arteries are patent. Atherosclerotic irregularity of the M2 and more distal MCA vessels, bilaterally. No M2 proximal branch occlusion or high-grade proximal stenosis is identified. The anterior cerebral arteries are patent. 1-2 mm ventrally projecting vascular protrusion arising from the cavernous right ICA, suspicious for an aneurysm (series 8, image 97) (series 6, image 260). Posterior circulation: The intracranial vertebral arteries are patent. The basilar artery is patent. The posterior cerebral arteries are patent. Posterior communicating arteries are diminutive or absent, bilaterally. Venous sinuses: Within the limitations of contrast timing, no convincing thrombus. Anatomic variants: As described. Review of the MIP images confirms the above findings No emergent large vessel occlusion identified. These results were called by telephone at the time of interpretation on 08/28/2021 at 10:11 pm to provider Merlyn Lot ,  who verbally acknowledged these results. IMPRESSION: CTA neck: 1. The common carotid, internal carotid and vertebral arteries are patent within the neck. Mild atherosclerotic narrowing at the origin of the left vertebral artery. 2. Aortic Atherosclerosis  (ICD10-I70.0). 3. 2.7 cm left thyroid lobe nodule. This nodule was previously evaluated by ultrasound on 02/08/2013, and fine-needle aspiration was recommended at that time. Please refer to this prior report for further details. 4. Cervical spondylosis. Most notably, a posterior disc osteophyte complex contributes to suspected severe spinal canal stenosis at C4-C5. Consider a cervical spine MRI to assess for spinal cord impingement at this level, when clinically appropriate. CTA head: 1. No intracranial large vessel occlusion or proximal high-grade arterial stenosis is identified. 2. 1-2 mm ventrally projecting vascular protrusion arising from the cavernous right ICA, suspicious for an aneurysm. Electronically Signed   By: Kellie Simmering D.O.   On: 08/28/2021 22:14   DG Chest Portable 1 View  Result Date: 08/28/2021 CLINICAL DATA:  Weakness, tobacco abuse EXAM: PORTABLE CHEST 1 VIEW COMPARISON:  None Available. FINDINGS: Single frontal view of the chest demonstrates mild enlargement of the cardiac silhouette. There is increased central vascular congestion. Subpleural interstitial prominence at the lung bases may reflect Kerley lines related to interstitial edema. No effusion or pneumothorax. No acute airspace disease. IMPRESSION: 1. Findings consistent with mild congestive heart failure and interstitial edema. Electronically Signed   By: Randa Ngo M.D.   On: 08/28/2021 20:52   CT HEAD CODE STROKE WO CONTRAST  Result Date: 08/28/2021 CLINICAL DATA:  Code stroke. Neuro deficit, acute, stroke suspected. Left-sided weakness. Last known well 1 p.m. today. EXAM: CT HEAD WITHOUT CONTRAST TECHNIQUE: Contiguous axial images were obtained from the base of the skull through the vertex without intravenous contrast. RADIATION DOSE REDUCTION: This exam was performed according to the departmental dose-optimization program which includes automated exposure control, adjustment of the mA and/or kV according to patient size  and/or use of iterative reconstruction technique. COMPARISON:  None Available. FINDINGS: Brain: Mild generalized cerebral atrophy. Chronic lacunar infarct within the right basal ganglia (for instance as seen on series 3, image 11). Additional small hypodense focus more inferiorly within the right basal ganglia, favored to reflect a prominent perivascular space (for instance as seen on series 5, image 29). Partially empty sella turcica. There is no acute intracranial hemorrhage. No demarcated cortical infarct. No extra-axial fluid collection. No evidence of an intracranial mass. No midline shift. Vascular: No hyperdense vessel. Atherosclerotic calcifications. Skull: No fracture or aggressive osseous lesion. Sinuses/Orbits: No mass or acute finding within the imaged orbits. No significant paranasal sinus disease at the imaged levels. ASPECTS ([place] Stroke Program Early CT Score) - Ganglionic level infarction (caudate, lentiform nuclei, internal capsule, insula, M1-M3 cortex): 7 - Supraganglionic infarction (M4-M6 cortex): 3 Total score (0-10 with 10 being normal): 10 (when discounting the chronic right basal ganglia lacunar infarct). No evidence of acute intracranial abnormality. These results were called by telephone at the time of interpretation on 08/28/2021 at 8:16 pm to provider Dr. Quentin Cornwall, Who verbally acknowledged these results. IMPRESSION: No evidence of acute intracranial abnormality. ASPECTS is 10. Chronic right basal ganglia lacunar infarct. Mild generalized cerebral atrophy. Electronically Signed   By: Kellie Simmering D.O.   On: 08/28/2021 20:17        Scheduled Meds:   stroke: early stages of recovery book   Does not apply Once   calcium-vitamin D  1 tablet Oral Q breakfast   enoxaparin (LOVENOX) injection  40 mg Subcutaneous Q24H  iron polysaccharides  150 mg Oral Daily   levothyroxine  100 mcg Oral Q0600   metoprolol succinate  50 mg Oral Daily   multivitamin with minerals   Oral Daily    rosuvastatin  5 mg Oral Daily   Continuous Infusions:  sodium chloride 100 mL/hr at 08/29/21 0006     LOS: 0 days    Time spent: 35 mins     Charise Killian, MD [facility] Pager [phone]  If 7PM-7AM, please contact night-coverage [email or web address] 08/29/2021, 8:02 AM

## 2021-08-29 NOTE — Plan of Care (Signed)

## 2021-08-29 NOTE — Assessment & Plan Note (Signed)
-   We will continue statin therapy and check fasting lipids. 

## 2021-08-30 DIAGNOSIS — E785 Hyperlipidemia, unspecified: Secondary | ICD-10-CM | POA: Diagnosis not present

## 2021-08-30 DIAGNOSIS — E876 Hypokalemia: Secondary | ICD-10-CM

## 2021-08-30 DIAGNOSIS — I639 Cerebral infarction, unspecified: Secondary | ICD-10-CM | POA: Diagnosis not present

## 2021-08-30 LAB — CBC
HCT: 32.6 % — ABNORMAL LOW (ref 36.0–46.0)
Hemoglobin: 10.8 g/dL — ABNORMAL LOW (ref 12.0–15.0)
MCH: 25.8 pg — ABNORMAL LOW (ref 26.0–34.0)
MCHC: 33.1 g/dL (ref 30.0–36.0)
MCV: 78 fL — ABNORMAL LOW (ref 80.0–100.0)
Platelets: 140 10*3/uL — ABNORMAL LOW (ref 150–400)
RBC: 4.18 MIL/uL (ref 3.87–5.11)
RDW: 15.3 % (ref 11.5–15.5)
WBC: 3.6 10*3/uL — ABNORMAL LOW (ref 4.0–10.5)
nRBC: 0 % (ref 0.0–0.2)

## 2021-08-30 LAB — BASIC METABOLIC PANEL
Anion gap: 5 (ref 5–15)
BUN: 8 mg/dL (ref 8–23)
CO2: 24 mmol/L (ref 22–32)
Calcium: 8.8 mg/dL — ABNORMAL LOW (ref 8.9–10.3)
Chloride: 111 mmol/L (ref 98–111)
Creatinine, Ser: 0.77 mg/dL (ref 0.44–1.00)
GFR, Estimated: >60 mL/min (ref >60)
Glucose, Bld: 88 mg/dL (ref 70–99)
Potassium: 3.3 mmol/L — ABNORMAL LOW (ref 3.5–5.1)
Sodium: 140 mmol/L (ref 135–145)

## 2021-08-30 MED ORDER — POTASSIUM CHLORIDE CRYS ER 20 MEQ PO TBCR
40.0000 meq | EXTENDED_RELEASE_TABLET | Freq: Once | ORAL | Status: AC
  Administered 2021-08-30: 40 meq via ORAL
  Filled 2021-08-30: qty 2

## 2021-08-30 MED ORDER — ROSUVASTATIN CALCIUM 20 MG PO TABS
40.0000 mg | ORAL_TABLET | Freq: Every day | ORAL | Status: DC
  Administered 2021-08-30 – 2021-09-02 (×4): 40 mg via ORAL
  Filled 2021-08-30 (×4): qty 2

## 2021-08-30 NOTE — PMR Pre-admission (Signed)
PMR Admission Coordinator Pre-Admission Assessment  Patient: Susan Farrell is an 76 y.o., female MRN: [id number] DOB: [birth year] Height: 5\' 6"  (167.6 cm) Weight: 72.1 kg  Insurance Information HMO: yes    PPO:      PCP:      IPA:      80/20:      OTHER:  PRIMARY: [employer]      Policy#: [id number]      Subscriber: pt CM Name: Aundra Millet      Phone#: [phone] ext [phone]     Fax#: [phone] Pre-Cert#: [id number]       Employer:  Benefits:  Phone #: [phone]     Name: 08/30/21 Eff. Date: 01/20/21     Deduct: none      Out of Pocket Max: $3400      Life Max: none CIR: $295 co pay per day days 1 until 6      SNF: no copay days 1 until 20; $196 co pay per day days 21 until 100 Outpatient: $10 to $20 per visit     Co-Pay: visits per medical neccesity Home Health: 100%      Co-Pay: visits per medical neccesity DME: 80%     Co-Pay: 20% Providers: in network  SECONDARY: [employer]      Policy#: [id number]  Financial Counselor:       Phone#:   The "Data Collection Information Summary" for patients in Inpatient Rehabilitation Facilities with attached "Privacy Act Statement-Health Care Records" was provided and verbally reviewed with: Patient and Family  Emergency Contact Information Contact Information     Name Relation Home Work Mobile   Loraine Grip Spouse [phone]  [phone]      Current Medical History  Patient Admitting Diagnosis: CVA  History of Present Illness: 76 year old female with history of discoid lupus, HTN, HLD, hypothyroidism, AAA s/p rupture an repair who presented on 08/28/21 with acute onset of LLE weakness.   Neurology consulted. CTA head and neck with no significant stenosis. MRI brain with small vessel appearing stroke in the right basal ganglia. Recommended ASA and Plavix followed by Jonne Ply monotherapy. Smoking cessation. HTN with permissive hypertension  initially. Increase Statin for HLD. Synthroid for hypothyroidism. Thrombocytopenia with  etiology unclear. Will continue to monitor.  Complete NIHSS TOTAL: 4  Patient's medical record from [facility] has been reviewed by the rehabilitation admission coordinator and physician.  Past Medical History  No past medical history on file.  Has the patient had major surgery during 100 days prior to admission? No  Family History   family history is not on file.  Current Medications  Current Facility-Administered Medications:    acetaminophen (TYLENOL) tablet 650 mg, 650 mg, Oral, Q6H PRN **OR** acetaminophen (TYLENOL) suppository 650 mg, 650 mg, Rectal, Q6H PRN, Mansy, Jan A, MD   aspirin EC tablet 81 mg, 81 mg, Oral, Daily, Rejeana Brock, MD, 81 mg at 08/30/21 0848   calcium-vitamin D (OSCAL WITH D) 500-5 MG-MCG per tablet 1 tablet, 1 tablet, Oral, Q breakfast, Mansy, Jan A, MD, 1 tablet at 08/30/21 0849   [COMPLETED] clopidogrel (PLAVIX) tablet 300 mg, 300 mg, Oral, Once, 300 mg at 08/29/21 1703 **AND** clopidogrel (PLAVIX) tablet 75 mg, 75 mg, Oral, Daily, Rejeana Brock, MD, 75 mg at 08/30/21 0848   enoxaparin (LOVENOX) injection 40 mg, 40 mg, Subcutaneous, Q24H, Mansy, Jan A, MD, 40 mg at 08/30/21 0849   iron polysaccharides (NIFEREX) capsule 150 mg, 150 mg, Oral, Daily, Mansy, Jan  A, MD, 150 mg at 08/30/21 0848   levothyroxine (SYNTHROID) tablet 100 mcg, 100 mcg, Oral, Q0600, Mansy, Jan A, MD, 100 mcg at 08/30/21 8413   magnesium hydroxide (MILK OF MAGNESIA) suspension 30 mL, 30 mL, Oral, Daily PRN, Mansy, Jan A, MD   metoprolol succinate (TOPROL-XL) 24 hr tablet 50 mg, 50 mg, Oral, Daily, Mansy, Jan A, MD, 50 mg at 08/30/21 0849   multivitamin with minerals tablet, , Oral, Daily, Mansy, Jan A, MD, 1 tablet at 08/30/21 0849   ondansetron (ZOFRAN) tablet 4 mg, 4 mg, Oral, Q6H PRN **OR** ondansetron (ZOFRAN) injection 4 mg, 4 mg, Intravenous, Q6H PRN, Mansy, Jan A, MD   rosuvastatin (CRESTOR) tablet 40 mg, 40 mg, Oral, Daily, Mayford Knife, Jamiese M,  MD, 40 mg at 08/30/21 0848   traZODone (DESYREL) tablet 25 mg, 25 mg, Oral, QHS PRN, Mansy, Vernetta Honey, MD  Patients Current Diet:  Diet Order             Diet Heart Room service appropriate? Yes; Fluid consistency: Thin  Diet effective now                  Precautions / Restrictions Precautions Precautions: Fall Restrictions Weight Bearing Restrictions: No   Has the patient had 2 or more falls or a fall with injury in the past year? No  Prior Activity Level Community (5-7x/wk): I without AD, drives , retired  Prior Functional Level Self Care: Did the patient need help bathing, dressing, using the toilet or eating? Independent  Indoor Mobility: Did the patient need assistance with walking from room to room (with or without device)? Independent  Stairs: Did the patient need assistance with internal or external stairs (with or without device)? Independent  Functional Cognition: Did the patient need help planning regular tasks such as shopping or remembering to take medications? Independent  Patient Information Are you of Hispanic, Latino/a,or Spanish origin?: A. No, not of Hispanic, Latino/a, or Spanish origin What is your race?: B. Black or African American Do you need or want an interpreter to communicate with a doctor or health care staff?: 0. No  Patient's Response To:  Health Literacy and Transportation Is the patient able to respond to health literacy and transportation needs?: Yes Health Literacy - How often do you need to have someone help you when you read instructions, pamphlets, or other written material from your doctor or pharmacy?: Never In the past 12 months, has lack of transportation kept you from medical appointments or from getting medications?: No In the past 12 months, has lack of transportation kept you from meetings, work, or from getting things needed for daily living?: No  Journalist, newspaper / Equipment Home Assistive Devices/Equipment: None Home  Equipment: Shower seat - built in  Prior Device Use: Indicate devices/aids used by the patient prior to current illness, exacerbation or injury? None of the above  Current Functional Level Cognition  Arousal/Alertness: Awake/alert Overall Cognitive Status: Within Functional Limits for tasks assessed Orientation Level: Oriented X4 General Comments: A&Ox4, able to follow commands, great attention to task. Pt is very friendly and cooperative. Attention:  [facility]) Memory: Appears intact Awareness: Appears intact Problem Solving: Appears intact Executive Function: Reasoning Reasoning: Appears intact Safety/Judgment: Appears intact    Extremity Assessment (includes Sensation/Coordination)  Upper Extremity Assessment: LUE deficits/detail (RUE WFL) LUE Deficits / Details: Shoulder flexion AROM limited to ~90 deg and grossly 120 deg for AAROM. MMT: elbow flexion grossly 4/5, elbow extension grossly 4-/5, grip strength fair. LUE  Sensation: WNL (intact to light touch, hot/cold discrimination WFL (able to feel warm water when washing hands)) LUE Coordination: decreased fine motor (decreased speed, dysmetria with finger to nose testing (undershooting), thumb opposition intact)  Lower Extremity Assessment: Defer to PT evaluation LLE Deficits / Details: hip flexion 2+/5; knee extension 4-/5; knee flexion 3+/5; DF 2/5; intact proprioception and light touch; increased L LE tone noted; limited coordination d/t weakness    ADLs  Overall ADL's : Needs assistance/impaired Grooming: Oral care, Wash/dry hands, Standing, Moderate assistance Grooming Details (indicate cue type and reason): Pt completed grooming tasks standing at sink with Mod A x2 + RW and L knee blocking Lower Body Dressing: Maximal assistance Lower Body Dressing Details (indicate cue type and reason): Max A to doff socks at bed level Functional mobility during ADLs: Moderate assistance, +2 for physical assistance, Rolling walker (2  wheels) General ADL Comments: Pt completed functional mobility at room level with Mod A x2 + RW. Pt demonstrated difficulty with weightshifting and dragging L foot behind, required VC to maintain wide BOS.    Mobility  Overal bed mobility:  (deferred (pt sitting on edge of bed upon PT arrival)) Bed Mobility: Supine to Sit, Sit to Supine Supine to sit: Supervision, HOB elevated Sit to supine: Mod assist General bed mobility comments: seated in recliner chair    Transfers  Overall transfer level: Needs assistance Equipment used: Rolling walker (2 wheels) Transfers: Sit to/from Stand, Bed to chair/wheelchair/BSC Sit to Stand: Min assist, Mod assist Bed to/from chair/wheelchair/BSC transfer type:: Step pivot Step pivot transfers: Min assist, Mod assist General transfer comment: vc's for UE/LE placement (pt using B UE's to move L LE into position); assist to initiate stand and control descent sitting; stand step turn bed to recliner with RW use (manual assist for L knee stabilization and cueing to prevent hyperextension)    Ambulation / Gait / Stairs / Wheelchair Mobility  Ambulation/Gait Ambulation/Gait assistance: Min assist, Mod assist, +2 safety/equipment, Min guard (CGA x1 plus min to mod assist of 2nd) Gait Distance (Feet): 30 Feet Assistive device: Rolling walker (2 wheels) General Gait Details: vc's to shift weight to R to advance L LE (pt with increased effort/time to advance L LE shorter distance); initial vc's for upright positioning (d/t posterior L lean); vc's for walker use and overall technique; after 15 feet of ambulation pt noted with increased L knee hyperextension motion during L LE stance phase requiring fairly consistent manual assist/cueing from therapist to keep knee in safe positioning (to prevent quick L knee hyperextension motion). Gait velocity: decreased    Posture / Balance Dynamic Sitting Balance Sitting balance - Comments: min - mod A for balance when donning B  socks with L lateral lean Balance Overall balance assessment: Needs assistance Sitting-balance support: No upper extremity supported, Feet supported Sitting balance-Leahy Scale: Poor Sitting balance - Comments: min - mod A for balance when donning B socks with L lateral lean Standing balance support: Reliant on assistive device for balance, During functional activity Standing balance-Leahy Scale: Poor Standing balance comment: mild posterior L lean in standing using RW requiring occasional cueing and assist for balance    Special needs/care consideration Fall [facility] room at [facility] on 8/11   Previous Home Environment  Living Arrangements: Spouse/significant other  Lives With: Spouse Available Help at Discharge: Family, Available 24 hours/day Type of Home: House Home Layout: One level Home Access: Stairs to enter Entrance Stairs-Rails: None Entrance Stairs-Number of Steps: 2 Bathroom Shower/Tub: Health visitor:  Handicapped height Bathroom Accessibility: Yes How Accessible: Accessible via walker Home Care Services: No Additional Comments: 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  Discharge Living Setting Plans for Discharge Living Setting: Patient's home, Lives with (comment) (spouse) Type of Home at Discharge: House Discharge Home Layout: One level Discharge Home Access: Stairs to enter Entrance Stairs-Rails: None Entrance Stairs-Number of Steps: 2 Discharge Bathroom Shower/Tub: Walk-in shower Discharge Bathroom Toilet: Handicapped height Discharge Bathroom Accessibility: Yes How Accessible: Accessible via walker Does the patient have any problems obtaining your medications?: No  Social/Family/Support Systems Patient Roles: Spouse Contact Information: spouse, Alcide Clever Anticipated Caregiver: spouse and sisters Anticipated Caregiver's Contact Information: see contacts Ability/Limitations of Caregiver: none Caregiver Availability: 24/7 Discharge Plan Discussed  with Primary Caregiver: Yes Is Caregiver In Agreement with Plan?: Yes Does Caregiver/Family have Issues with Lodging/Transportation while Pt is in Rehab?: No  Goals Patient/Family Goal for Rehab: supervision PT, supervision to MIn OT, no SLP Expected length of stay: ELOS 10 to 14 days Pt/Family Agrees to Admission and willing to participate: Yes Program Orientation Provided & Reviewed with Pt/Caregiver Including Roles  & Responsibilities: Yes  Decrease burden of Care through IP rehab admission: n/a  Possible need for SNF placement upon discharge: not anticipated  Patient Condition: I have reviewed medical records from [facility], spoken with CM, and patient and spouse. I discussed via phone for inpatient rehabilitation assessment.  Patient will benefit from ongoing PT and OT, can actively participate in 3 hours of therapy a day 5 days of the week, and can make measurable gains during the admission.  Patient will also benefit from the coordinated team approach during an Inpatient Acute Rehabilitation admission.  The patient will receive intensive therapy as well as Rehabilitation physician, nursing, social worker, and care management interventions.  Due to bladder management, bowel management, safety, skin/wound care, disease management, medication administration, pain management, and patient education the patient requires 24 hour a day rehabilitation nursing.  The patient is currently *** with mobility and basic ADLs.  Discharge setting and therapy post discharge at home with home health is anticipated.  Patient has agreed to participate in the Acute Inpatient Rehabilitation Program and will admit today.  Preadmission Screen Completed By:  Clois Dupes, 08/30/2021 2:42 PM ______________________________________________________________________   Discussed status with Dr. Marland Kitchen on *** at *** and received approval for admission today.  Admission Coordinator:  Clois Dupes, RN, time  Marland KitchenDorna Bloom ***   Assessment/Plan: Diagnosis: Does the need for close, 24 hr/day Medical supervision in concert with the patient's rehab needs make it unreasonable for this patient to be served in a less intensive setting? {yes_no_potentially:3041433} Co-Morbidities requiring supervision/potential complications: *** Due to {due ZO:1096045}, does the patient require 24 hr/day rehab nursing? {yes_no_potentially:3041433} Does the patient require coordinated care of a physician, rehab nurse, PT, OT, and SLP to address physical and functional deficits in the context of the above medical diagnosis(es)? {yes_no_potentially:3041433} Addressing deficits in the following areas: {deficits:3041436} Can the patient actively participate in an intensive therapy program of at least 3 hrs of therapy 5 days a week? {yes_no_potentially:3041433} The potential for patient to make measurable gains while on inpatient rehab is {potential:3041437} Anticipated functional outcomes upon discharge from inpatient rehab: {functional outcomes:304600100} PT, {functional outcomes:304600100} OT, {functional outcomes:304600100} SLP Estimated rehab length of stay to reach the above functional goals is: *** Anticipated discharge destination: {anticipated dc setting:21604} 10. Overall Rehab/Functional Prognosis: {potential:3041437}   MD Signature: ***

## 2021-08-30 NOTE — Progress Notes (Signed)
   08/30/21 1339  What Happened  Was fall witnessed? No  Was patient injured? No  Patient found on floor  Found by Staff-comment  Stated prior activity to/from bed, chair, or stretcher (pt trying to plug her phone in and slipped)  Follow Up  MD notified Fabienne Bruns MD  Time MD notified [phone]  Additional tests No  Progress note created (see row info) Yes  Adult Fall Risk Assessment  Risk Factor Category (scoring not indicated) Fall has occurred during this admission (document High fall risk)  Age 76  Fall History: Fall within 6 months prior to admission 0  Elimination; Bowel and/or Urine Incontinence 0  Elimination; Bowel and/or Urine Urgency/Frequency 0  Medications: includes PCA/Opiates, Anti-convulsants, Anti-hypertensives, Diuretics, Hypnotics, Laxatives, Sedatives, and Psychotropics 5  Patient Care Equipment 1  Mobility-Assistance 2  Mobility-Gait 2  Mobility-Sensory Deficit 0  Altered awareness of immediate physical environment 0  Impulsiveness 0  Lack of understanding of one's physical/cognitive limitations 0  Total Score 12  Patient Fall Risk Level Moderate fall risk  Adult Fall Risk Interventions  Required Bundle Interventions *See Row Information* Moderate fall risk - low and moderate requirements implemented  Additional Interventions Use of appropriate toileting equipment (bedpan, BSC, etc.);HeadStart chair sensor (education/return demonstration)  Vitals  Temp 98.2 F (36.8 C)  Temp Source Oral  BP (!) 184/101  MAP (mmHg) 125  BP Location Right Arm  BP Method Automatic  Patient Position (if appropriate) Sitting  Pulse Rate 78  Pulse Rate Source Monitor  Oxygen Therapy  SpO2 100 %  O2 Device Room Air  Pain Assessment  Pain Scale 0-10  Pain Score 0  Neurological  Neuro (WDL) X  Level of Consciousness Alert  Orientation Level Oriented X4  Cognition Appropriate at baseline  Speech Clear  R Pupil Size (mm) 3  R Pupil Shape Round  R Pupil Reaction  Brisk  L Pupil Size (mm) 3  L Pupil Shape Round  L Pupil Reaction Brisk  Motor Function/Sensation Assessment Head  Facial Symmetry Symmetrical  R Hand Grip Moderate  L Hand Grip Moderate  R Elbow Extension (Push/Biceps) Strong  L Elbow Extension (Push/Biceps) Moderate  R Elbow Flexion (Pull/Triceps) Strong  L Elbow Flexion (Pull/Triceps) Moderate  R Foot Dorsiflexion Strong  L Foot Dorsiflexion Moderate  R Foot Plantar Flexion Moderate  L Foot Plantar Flexion Moderate  R Finger to Nose (Point to [employer]) Smooth  L Finger to Nose (Point to [employer]) Smooth  R Heel to [employer] (Point to [employer]) Smooth  L Heel to [employer] (Point to [employer]) Ataxia  RUE Motor Response Purposeful movement  RUE Sensation Full sensation  RUE Motor Strength 5  LUE Motor Response Purposeful movement  LUE Sensation Full sensation  LUE Motor Strength 5  RLE Motor Response Purposeful movement  RLE Sensation Full sensation  RLE Motor Strength 5  LLE Motor Response Purposeful movement  LLE Sensation Full sensation  LLE Motor Strength 4  Neuro Symptoms None  Seizure Activity  Psychomotor Symptoms None  Glasgow Coma Scale  Eye Opening 4  Best Verbal Response (NON-intubated) 5  Best Motor Response 6  Glasgow Coma Scale Score 15  Musculoskeletal  Musculoskeletal (WDL) X  Assistive Device BSC  Generalized Weakness Yes  Weight Bearing Restrictions No  Musculoskeletal Details  LUE Weakness  LLE Weakness;Limited movement  Integumentary  Integumentary (WDL) X  Skin Color Appropriate for ethnicity  Skin Condition Dry;Other (Comment)  Skin Integrity Other (Comment)

## 2021-08-30 NOTE — Progress Notes (Signed)
Physical Therapy Treatment Patient Details Name: Susan Farrell MRN: [id number] DOB: Aug 15, 1945 Today's Date: 08/30/2021   History of Present Illness Pt is a 76 y.o. female presenting to hospital 08/28/21 with c/o L LE weakness.  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  Pt admitted with CVA.  PMH includes discoid lupus, htn, dyslipidemia, hypothyroidism, AAA (s/p rupture and repair), tobacco abuse.    PT Comments    Pt sitting on edge of bed upon PT arrival (pt had just finished with bathing and toileting with NT).  Sitting balance min assist (d/t posterior lean) with dynamic sitting activities but steady with static sitting.  Pt initially with posterior L lean in standing using RW requiring assist for upright balance but pt able to correct with cueing and min assist during session.  During session pt min to mod assist with transfers with RW use and CGA x1 plus min to mod assist of 2nd to ambulate 30 feet with RW use.  L LE weakness continues to be noted (most significantly with hip flexion and DF).   During ambulation, pt requiring cueing to shift weight to R in order to advance L LE.  After 15 feet of ambulation pt noted with increased L knee hyperextension motion during L LE stance phase requiring fairly consistent manual assist/cueing from therapist to keep knee in safe positioning (to prevent quick L knee hyperextension motion).  Will continue to focus on strengthening, balance, and progressive functional mobility during hospitalization.  Pt continues to be very motivated to participate in therapy.  Continue to recommend CIR.   Recommendations for follow up therapy are one component of a multi-disciplinary discharge planning process, led by the attending physician.  Recommendations may be updated based on patient status, additional functional criteria and insurance authorization.  Follow Up Recommendations  Acute inpatient rehab (3hours/day)      Assistance Recommended at Discharge Frequent or constant Supervision/Assistance  Patient can return home with the following A lot of help with walking and/or transfers;A lot of help with bathing/dressing/bathroom;Assistance with cooking/housework;Assist for transportation;Help with stairs or ramp for entrance   Equipment Recommendations  Rolling walker (2 wheels);BSC/3in1    Recommendations for Other Services OT consult;Rehab consult     Precautions / Restrictions Precautions Precautions: Fall Restrictions Weight Bearing Restrictions: No     Mobility  Bed Mobility Overal bed mobility:  (deferred (pt sitting on edge of bed upon PT arrival))                  Transfers Overall transfer level: Needs assistance Equipment used: Rolling walker (2 wheels) Transfers: Sit to/from Stand, Bed to chair/wheelchair/BSC Sit to Stand: Min assist, Mod assist   Step pivot transfers: Min assist, Mod assist       General transfer comment: vc's for UE/LE placement (pt using B UE's to move L LE into position); assist to initiate stand and control descent sitting; stand step turn bed to recliner with RW use (manual assist for L knee stabilization and cueing to prevent hyperextension)    Ambulation/Gait Ambulation/Gait assistance: Min assist, Mod assist, +2 safety/equipment, Min guard (CGA x1 plus min to mod assist of 2nd) Gait Distance (Feet): 30 Feet Assistive device: Rolling walker (2 wheels)   Gait velocity: decreased     General Gait Details: vc's to shift weight to R to advance L LE (pt with increased effort/time to advance L LE shorter distance); initial vc's for upright positioning (d/t posterior L lean); vc's for  walker use and overall technique; after 15 feet of ambulation pt noted with increased L knee hyperextension motion during L LE stance phase requiring fairly consistent manual assist/cueing from therapist to keep knee in safe positioning (to prevent quick L knee  hyperextension motion).   Stairs             Wheelchair Mobility    Modified Rankin (Stroke Patients Only)       Balance Overall balance assessment: Needs assistance Sitting-balance support: No upper extremity supported, Feet supported Sitting balance-Leahy Scale: Fair Sitting balance - Comments: steady static sitting but requiring min assist for more dynamic activity (tending to lose balance backwards)   Standing balance support: Reliant on assistive device for balance, During functional activity Standing balance-Leahy Scale: Poor Standing balance comment: mild posterior L lean in standing using RW requiring occasional cueing and assist for balance                            Cognition Arousal/Alertness: Awake/alert Behavior During Therapy: WFL for tasks assessed/performed Overall Cognitive Status: Within Functional Limits for tasks assessed                                          Exercises General Exercises - Lower Extremity Ankle Circles/Pumps: AAROM, Strengthening, Left, 10 reps, Seated Long Arc Quad: AAROM, Strengthening, Left, 10 reps, Seated Hip Flexion/Marching: AAROM, Strengthening, Left, 10 reps, Seated    General Comments  Nursing cleared pt for participation in physical therapy.  Pt agreeable to PT session.      Pertinent Vitals/Pain Pain Assessment Pain Assessment: No/denies pain Vitals (HR and O2 on room air) stable and WFL throughout treatment session.    Home Living                          Prior Function            PT Goals (current goals can now be found in the care plan section) Acute Rehab PT Goals Patient Stated Goal: to improve mobility and strength PT Goal Formulation: With patient Time For Goal Achievement: 09/12/21 Potential to Achieve Goals: Good Progress towards PT goals: Progressing toward goals    Frequency    7X/week      PT Plan Current plan remains appropriate     Co-evaluation              AM-PAC PT "6 Clicks" Mobility   Outcome Measure  Help needed turning from your back to your side while in a flat bed without using bedrails?: A Little Help needed moving from lying on your back to sitting on the side of a flat bed without using bedrails?: A Little Help needed moving to and from a bed to a chair (including a wheelchair)?: A Lot Help needed standing up from a chair using your arms (e.g., wheelchair or bedside chair)?: A Lot Help needed to walk in hospital room?: A Lot Help needed climbing 3-5 steps with a railing? : Total 6 Click Score: 13    End of Session Equipment Utilized During Treatment: Gait belt Activity Tolerance: Patient tolerated treatment well Patient left: in chair;with call bell/phone within reach;with chair alarm set Nurse Communication: Mobility status;Precautions PT Visit Diagnosis: Other abnormalities of gait and mobility (R26.89);Hemiplegia and hemiparesis Hemiplegia - Right/Left: Left Hemiplegia - caused by:  Cerebral infarction     Time: 1012-1040 PT Time Calculation (min) (ACUTE ONLY): 28 min  Charges:  $Gait Training: 8-22 mins $Therapeutic Exercise: 8-22 mins                    Hendricks Limes, PT 08/30/21, 11:08 AM

## 2021-08-30 NOTE — Progress Notes (Addendum)
  Inpatient Rehabilitation Admissions Coordinator   I spoke with patient by phone. She is in agreement for me to pursue insurance approval with [facility] for possible CIR admit at [facility] [place] , inpatient acute hospital rehab. I will begin approval once I have updated therapy clinicals from today. Acute team made aware.Spouse is retired and can provide 24/7 assist as recommended after a CIR admit. I will follow up on Monday.  Ottie Glazier, RN, MSN Rehab Admissions Coordinator [phone] 08/30/2021 10:48 AM

## 2021-08-30 NOTE — Progress Notes (Signed)
Mobility Specialist - Progress Note     08/30/21 1437  Mobility  Activity  (bed exercies)  Range of Motion/Exercises Right leg;Left leg  Level of Assistance Minimal assist, patient does 75% or more  Activity Response Tolerated well  $Mobility charge 1 Mobility   Pt supine upon entry. Pt utilizing RA. Pt actively completed 5 bilateral leg raises, 10 bilateral ankle pumps, and 10 bilateral adductor pillow squeezes. Pt needed min assist for flexion of left knee. Pt left supine with alarm set and needs in reach. No complaints.   Zetta Bills Mobility Specialist 08/30/21 2:46 PM

## 2021-08-30 NOTE — Progress Notes (Signed)
PROGRESS NOTE    Susan Farrell  [id number] DOB: Jul 02, 1945 DOA: 08/28/2021 PCP: Tracie Harrier, MD  Assessment & Plan:   Principal Problem:   CVA (cerebral vascular accident) [facility]) Active Problems:   Hypertensive urgency   Dyslipidemia   Hypothyroidism  Assessment and Plan: CVA: ischemic nonhemorrhagic right basal ganglia infarct as per MRI brain. Continue w/ neuro checks. Continue on aspirin, plavix x 3 weeks and then just aspirin monotherapy thereafter as per neuro. Echo shows EF [id number], grade I diastolic dysfunction, no regional wall motion abnormalities & no evidence of any interatrial shunt. PT/OT recs CIR.  Fall: in pt's room 08/30/21. Pt got up w/o asking for help. Pt did not hit her head and vitals signs were stable. Pt given education on not getting up by herself. Continue w/ bed/chair alarm.   Hypertensive urgency: urgency resolved but still w/ HTN. 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 as per med rec     DVT prophylaxis: lovenox  Code Status: full  Family Communication:  Disposition Plan: PT/OT recs CIR  Level of care: Med-Surg  Status is: Inpatient Remains inpatient appropriate because: severity of illness     Consultants:  Neuro   Procedures:   Antimicrobials:    Subjective: Pt c/o malaise  Objective: Vitals:   08/29/21 2012 08/30/21 0037 08/30/21 0403 08/30/21 0802  BP: (!) 163/75 (!) 158/80 (!) 164/84 (!) 172/75  Pulse: 62 (!) 58 64 62  Resp: 20 16 16 16   Temp: 98.9 F (37.2 C) 98.5 F (36.9 C) 99.1 F (37.3 C) 98.1 F (36.7 C)  TempSrc:      SpO2: 100% 100% 99% 99%  Weight:      Height:        Intake/Output Summary (Last 24 hours) at 08/30/2021 0837 Last data filed at 08/29/2021 0845 Gross per  24 hour  Intake 13.88 ml  Output --  Net 13.88 ml   Filed Weights   08/28/21 1954  Weight: 72.1 kg    Examination:  General exam: Appears comfortable  Respiratory system: clear breath sounds b/l  Cardiovascular system: S1/S2+. No rubs or gallops  Gastrointestinal system: Abd is soft, NT, ND & normal bowel sounds  Central nervous system: alert and oriented. Moves all extremities  Psychiatry: Judgement and insight appears normal. Flat mood and affect    Data Reviewed: I have personally reviewed following labs and imaging studies  CBC: Recent Labs  Lab 08/28/21 2018 08/29/21 0427 08/30/21 0354  WBC 4.2 4.7 3.6*  NEUTROABS 2.0  --   --   HGB 10.9* 10.6* 10.8*  HCT 33.5* 32.5* 32.6*  MCV 79.0* 78.1* 78.0*  PLT 149* 126* [id number]*   Basic Metabolic Panel: Recent Labs  Lab 08/28/21 2018 08/29/21 0427 08/30/21 0354  NA 141 143 140  K 3.6 3.5 3.3*  CL 106 111 111  CO2 27 24 24   GLUCOSE 112* 93 88  BUN 10 8 8   CREATININE 0.99 0.78 0.77  CALCIUM 9.2 8.6* 8.8*   GFR: Estimated Creatinine Clearance: 62.7 mL/min (by C-G formula based on SCr of 0.77 mg/dL). Liver Function Tests: Recent Labs  Lab 08/28/21 2018  AST 21  ALT 10  ALKPHOS 64  BILITOT 0.6  PROT 7.4  ALBUMIN 3.9   No  results for input(s): "LIPASE", "AMYLASE" in the last 168 hours. No results for input(s): "AMMONIA" in the last 168 hours. Coagulation Profile: Recent Labs  Lab 08/28/21 2018  INR 1.1   Cardiac Enzymes: No results for input(s): "CKTOTAL", "CKMB", "CKMBINDEX", "TROPONINI" in the last 168 hours. BNP (last 3 results) No results for input(s): "PROBNP" in the last 8760 hours. HbA1C: Recent Labs    08/28/21 2018  HGBA1C 5.7*   CBG: Recent Labs  Lab 08/28/21 1953  GLUCAP 124*   Lipid Profile: Recent Labs    08/29/21 0427  CHOL 137  HDL 34*  LDLCALC 90  TRIG 63  CHOLHDL 4.0   Thyroid Function Tests: No results for input(s): "TSH", "T4TOTAL", "FREET4", "T3FREE", "THYROIDAB"  in the last 72 hours. Anemia Panel: No results for input(s): "VITAMINB12", "FOLATE", "FERRITIN", "TIBC", "IRON", "RETICCTPCT" in the last 72 hours. Sepsis Labs: No results for input(s): "PROCALCITON", "LATICACIDVEN" in the last 168 hours.  Recent Results (from the past 240 hour(s))  Resp Panel by RT-PCR (Flu A&B, Covid) Anterior Nasal Swab     Status: None   Collection Time: 08/28/21  8:18 PM   Specimen: Anterior Nasal Swab  Result Value Ref Range Status   SARS Coronavirus 2 by RT PCR NEGATIVE NEGATIVE Final    Comment: (NOTE) SARS-CoV-2 target nucleic acids are NOT DETECTED.  The SARS-CoV-2 RNA is generally detectable in upper respiratory specimens during the acute phase of infection. The lowest concentration of SARS-CoV-2 viral copies this assay can detect is 138 copies/mL. A negative result does not preclude SARS-Cov-2 infection and should not be used as the sole basis for treatment or other patient management decisions. A negative result may occur with  improper specimen collection/handling, submission of specimen other than nasopharyngeal swab, presence of viral mutation(s) within the areas targeted by this assay, and inadequate number of viral copies(<138 copies/mL). A negative result must be combined with clinical observations, patient history, and epidemiological information. The expected result is Negative.  Fact Sheet for Patients:  EntrepreneurPulse.com.au  Fact Sheet for Healthcare Providers:  IncredibleEmployment.be  This test is no t yet approved or cleared by the [place] FDA and  has been authorized for detection and/or diagnosis of SARS-CoV-2 by FDA under an Emergency Use Authorization (EUA). This EUA will remain  in effect (meaning this test can be used) for the duration of the COVID-19 declaration under Section 564(b)(1) of the Act, 21 U.S.C.section 360bbb-3(b)(1), unless the authorization is terminated  or revoked  sooner.       Influenza A by PCR NEGATIVE NEGATIVE Final   Influenza B by PCR NEGATIVE NEGATIVE Final    Comment: (NOTE) The Xpert Xpress SARS-CoV-2/FLU/RSV plus assay is intended as an aid in the diagnosis of influenza from Nasopharyngeal swab specimens and should not be used as a sole basis for treatment. Nasal washings and aspirates are unacceptable for Xpert Xpress SARS-CoV-2/FLU/RSV testing.  Fact Sheet for Patients: EntrepreneurPulse.com.au  Fact Sheet for Healthcare Providers: IncredibleEmployment.be  This test is not yet approved or cleared by the [place] FDA and has been authorized for detection and/or diagnosis of SARS-CoV-2 by FDA under an Emergency Use Authorization (EUA). This EUA will remain in effect (meaning this test can be used) for the duration of the COVID-19 declaration under Section 564(b)(1) of the Act, 21 U.S.C. section 360bbb-3(b)(1), unless the authorization is terminated or revoked.  Performed at [facility], [address], [place], [place] [address]          Radiology Studies:  ECHOCARDIOGRAM COMPLETE BUBBLE STUDY  Result Date: 08/29/2021    ECHOCARDIOGRAM REPORT   Patient Name:   TAIWANA BALAGUER Date of Exam: 08/29/2021 Medical Rec #:  [id number]        Height:       66.0 in Accession #:    [id number]       Weight:       159.0 lb Date of Birth:  [birth year]        BSA:          1.814 m Patient Age:    76 years         BP:           160/81 mmHg Patient Gender: F                HR:           58 bpm. Exam Location:  [facility] Procedure: 2D Echo, Color Doppler, Cardiac Doppler and Saline Contrast Bubble            Study Indications:     I63.9 Stroke  History:         Patient has no prior history of Echocardiogram examinations.                  Risk Factors:Hypertension, Dyslipidemia and Current Smoker.  Sonographer:     Charmayne Sheer Referring Phys:  [id number] [place] Diagnosing Phys: Kathlyn Sacramento MD  IMPRESSIONS  1. Left ventricular ejection fraction, by estimation, is 60 to 65%. The left ventricle has normal function. The left ventricle has no regional wall motion abnormalities. There is moderate left ventricular hypertrophy. Left ventricular diastolic parameters are consistent with Grade I diastolic dysfunction (impaired relaxation).  2. Right ventricular systolic function is normal. The right ventricular size is normal. There is normal pulmonary artery systolic pressure.  3. Left atrial size was mildly dilated.  4. The mitral valve is normal in structure. Trivial mitral valve regurgitation. No evidence of mitral stenosis.  5. The aortic valve is normal in structure. Aortic valve regurgitation is not visualized. No aortic stenosis is present.  6. The inferior vena cava is normal in size with greater than 50% respiratory variability, suggesting right atrial pressure of 3 mmHg.  7. Agitated saline contrast bubble study was negative, with no evidence of any interatrial shunt. FINDINGS  Left Ventricle: Left ventricular ejection fraction, by estimation, is 60 to 65%. The left ventricle has normal function. The left ventricle has no regional wall motion abnormalities. The left ventricular internal cavity size was normal in size. There is  moderate left ventricular hypertrophy. Left ventricular diastolic parameters are consistent with Grade I diastolic dysfunction (impaired relaxation). Right Ventricle: The right ventricular size is normal. No increase in right ventricular wall thickness. Right ventricular systolic function is normal. There is normal pulmonary artery systolic pressure. The tricuspid regurgitant velocity is 2.54 m/s, and  with an assumed right atrial pressure of 3 mmHg, the estimated right ventricular systolic pressure is 0000000 mmHg. Left Atrium: Left atrial size was mildly dilated. Right Atrium: Right atrial size was normal in size. Pericardium: There is no evidence of pericardial effusion. Mitral  Valve: The mitral valve is normal in structure. Trivial mitral valve regurgitation. No evidence of mitral valve stenosis. Tricuspid Valve: The tricuspid valve is normal in structure. Tricuspid valve regurgitation is trivial. No evidence of tricuspid stenosis. Aortic Valve: The aortic valve is normal in structure. Aortic valve regurgitation is not visualized. No aortic stenosis is present. Aortic valve  mean gradient measures 5.0 mmHg. Aortic valve peak gradient measures 9.4 mmHg. Aortic valve area, by VTI measures 3.14 cm. Pulmonic Valve: The pulmonic valve was normal in structure. Pulmonic valve regurgitation is not visualized. No evidence of pulmonic stenosis. Aorta: The aortic root is normal in size and structure. Venous: The inferior vena cava is normal in size with greater than 50% respiratory variability, suggesting right atrial pressure of 3 mmHg. IAS/Shunts: No atrial level shunt detected by color flow Doppler. Agitated saline contrast was given intravenously to evaluate for intracardiac shunting. Agitated saline contrast bubble study was negative, with no evidence of any interatrial shunt.  LEFT VENTRICLE PLAX 2D LVIDd:         4.04 cm   Diastology LVIDs:         2.89 cm   LV e' medial:    4.57 cm/s LV PW:         1.39 cm   LV E/e' medial:  15.4 LV IVS:        0.91 cm   LV e' lateral:   5.87 cm/s LVOT diam:     2.30 cm   LV E/e' lateral: 12.0 LV SV:         95 LV SV Index:   52 LVOT Area:     4.15 cm  RIGHT VENTRICLE RV Basal diam:  3.80 cm LEFT ATRIUM             Index        RIGHT ATRIUM           Index LA diam:        3.30 cm 1.82 cm/m   RA Area:     12.60 cm LA Vol (A2C):   48.3 ml 26.63 ml/m  RA Volume:   28.60 ml  15.77 ml/m LA Vol (A4C):   49.7 ml 27.40 ml/m LA Biplane Vol: 52.8 ml 29.11 ml/m  AORTIC VALVE                     PULMONIC VALVE AV Area (Vmax):    2.82 cm      PV Vmax:       0.90 m/s AV Area (Vmean):   2.84 cm      PV Peak grad:  3.2 mmHg AV Area (VTI):     3.14 cm AV Vmax:            153.00 cm/s AV Vmean:          103.000 cm/s AV VTI:            0.302 m AV Peak Grad:      9.4 mmHg AV Mean Grad:      5.0 mmHg LVOT Vmax:         104.00 cm/s LVOT Vmean:        70.500 cm/s LVOT VTI:          0.228 m LVOT/AV VTI ratio: 0.75  AORTA Ao Root diam: 3.40 cm MITRAL VALVE               TRICUSPID VALVE MV Area (PHT): 2.15 cm    TR Peak grad:   25.8 mmHg MV Decel Time: 353 msec    TR Vmax:        254.00 cm/s MV E velocity: 70.30 cm/s MV A velocity: 98.10 cm/s  SHUNTS MV E/A ratio:  0.72        Systemic VTI:  0.23 m  Systemic Diam: 2.30 cm Kathlyn Sacramento MD Electronically signed by Kathlyn Sacramento MD Signature Date/Time: 08/29/2021/3:07:55 PM    Final    MR BRAIN WO CONTRAST  Result Date: 08/29/2021 CLINICAL DATA:  Initial evaluation for acute neuro deficit, stroke suspected. EXAM: MRI HEAD WITHOUT CONTRAST TECHNIQUE: Multiplanar, multiecho pulse sequences of the brain and surrounding structures were obtained without intravenous contrast. COMPARISON:  Prior CT from 08/28/2021. FINDINGS: Brain: Mild age-related cerebral atrophy with chronic small vessel ischemic disease. Remote lacunar infarct involving the anterior right basal ganglia with associated chronic hemosiderin staining noted. 1 cm acute ischemic nonhemorrhagic infarct present at the posterior right basal ganglia (series 5, image 84). No significant regional mass effect. No other evidence for acute or subacute ischemia. Gray-white matter differentiation otherwise maintained. No other acute or chronic intracranial blood products. No mass lesion, midline shift or mass effect. No hydrocephalus or extra-axial fluid collection. Pituitary gland and suprasellar region within normal limits. Vascular: Major intracranial vascular flow voids are maintained. Skull and upper cervical spine: Craniocervical junction within normal limits. Bone marrow signal intensity normal. No scalp soft tissue abnormality. Sinuses/Orbits: Globes  orbital soft tissues within normal limits. Paranasal sinuses are largely clear. No significant mastoid effusion. Other: None. IMPRESSION: 1. 1 cm acute ischemic nonhemorrhagic right basal ganglia infarct. 2. Underlying age-related cerebral atrophy with mild chronic small vessel ischemic disease, with additional chronic right basal ganglia lacunar infarct. Electronically Signed   By: Jeannine Boga M.D.   On: 08/29/2021 03:24   CT ANGIO HEAD NECK W WO CM (CODE STROKE)  Result Date: 08/28/2021 CLINICAL DATA:  Stroke, follow-up. Additional history provided: Patient unable to lift/move left leg. Symptoms began at 1300 today. Intermittent weakness in left arm. EXAM: CT ANGIOGRAPHY HEAD AND NECK TECHNIQUE: Multidetector CT imaging of the head and neck was performed using the standard protocol during bolus administration of intravenous contrast. Multiplanar CT image reconstructions and MIPs were obtained to evaluate the vascular anatomy. Carotid stenosis measurements (when applicable) are obtained utilizing NASCET criteria, using the distal internal carotid diameter as the denominator. RADIATION DOSE REDUCTION: This exam was performed according to the departmental dose-optimization program which includes automated exposure control, adjustment of the mA and/or kV according to patient size and/or use of iterative reconstruction technique. CONTRAST:  30mL OMNIPAQUE IOHEXOL 350 MG/ML SOLN COMPARISON:  Noncontrast head CT performed earlier today 08/28/2021. FINDINGS: CTA NECK FINDINGS Aortic arch: Common origin of the innominate and left common carotid arteries. Atherosclerotic plaque within the aortic arch. No hemodynamically significant innominate or proximal subclavian artery stenosis. Right carotid system: CCA and ICA patent within the neck without stenosis or significant atherosclerotic disease. Left carotid system: CCA and ICA patent within the neck without stenosis or significant atherosclerotic disease.  Vertebral arteries: Vertebral arteries patent within the neck. Mild atherosclerotic narrowing at the origin of the left vertebral artery. Skeleton: Nonspecific reversal of the expected cervical lordosis. Cervical spondylosis. Most notably at C4-C5, a posterior disc osteophyte complex contributes to suspected severe spinal canal stenosis (series 6, image 157). Other neck: 2.7 cm nodule within the left thyroid lobe. Upper chest: No consolidation within the imaged lung apices. Review of the MIP images confirms the above findings CTA HEAD FINDINGS Anterior circulation: The intracranial internal carotid arteries are patent. Nonstenotic atherosclerotic plaque within both vessels. The M1 middle cerebral arteries are patent. Atherosclerotic irregularity of the M2 and more distal MCA vessels, bilaterally. No M2 proximal branch occlusion or high-grade proximal stenosis is identified. The anterior cerebral arteries are patent. 1-2 mm  ventrally projecting vascular protrusion arising from the cavernous right ICA, suspicious for an aneurysm (series 8, image 97) (series 6, image 260). Posterior circulation: The intracranial vertebral arteries are patent. The basilar artery is patent. The posterior cerebral arteries are patent. Posterior communicating arteries are diminutive or absent, bilaterally. Venous sinuses: Within the limitations of contrast timing, no convincing thrombus. Anatomic variants: As described. Review of the MIP images confirms the above findings No emergent large vessel occlusion identified. These results were called by telephone at the time of interpretation on 08/28/2021 at 10:11 pm to provider Willy Eddy , who verbally acknowledged these results. IMPRESSION: CTA neck: 1. The common carotid, internal carotid and vertebral arteries are patent within the neck. Mild atherosclerotic narrowing at the origin of the left vertebral artery. 2. Aortic Atherosclerosis (ICD10-I70.0). 3. 2.7 cm left thyroid lobe nodule.  This nodule was previously evaluated by ultrasound on 02/08/2013, and fine-needle aspiration was recommended at that time. Please refer to this prior report for further details. 4. Cervical spondylosis. Most notably, a posterior disc osteophyte complex contributes to suspected severe spinal canal stenosis at C4-C5. Consider a cervical spine MRI to assess for spinal cord impingement at this level, when clinically appropriate. CTA head: 1. No intracranial large vessel occlusion or proximal high-grade arterial stenosis is identified. 2. 1-2 mm ventrally projecting vascular protrusion arising from the cavernous right ICA, suspicious for an aneurysm. Electronically Signed   By: Jackey Loge D.O.   On: 08/28/2021 22:14   DG Chest Portable 1 View  Result Date: 08/28/2021 CLINICAL DATA:  Weakness, tobacco abuse EXAM: PORTABLE CHEST 1 VIEW COMPARISON:  None Available. FINDINGS: Single frontal view of the chest demonstrates mild enlargement of the cardiac silhouette. There is increased central vascular congestion. Subpleural interstitial prominence at the lung bases may reflect Kerley lines related to interstitial edema. No effusion or pneumothorax. No acute airspace disease. IMPRESSION: 1. Findings consistent with mild congestive heart failure and interstitial edema. Electronically Signed   By: Sharlet Salina M.D.   On: 08/28/2021 20:52   CT HEAD CODE STROKE WO CONTRAST  Result Date: 08/28/2021 CLINICAL DATA:  Code stroke. Neuro deficit, acute, stroke suspected. Left-sided weakness. Last known well 1 p.m. today. EXAM: CT HEAD WITHOUT CONTRAST TECHNIQUE: Contiguous axial images were obtained from the base of the skull through the vertex without intravenous contrast. RADIATION DOSE REDUCTION: This exam was performed according to the departmental dose-optimization program which includes automated exposure control, adjustment of the mA and/or kV according to patient size and/or use of iterative reconstruction technique.  COMPARISON:  None Available. FINDINGS: Brain: Mild generalized cerebral atrophy. Chronic lacunar infarct within the right basal ganglia (for instance as seen on series 3, image 11). Additional small hypodense focus more inferiorly within the right basal ganglia, favored to reflect a prominent perivascular space (for instance as seen on series 5, image 29). Partially empty sella turcica. There is no acute intracranial hemorrhage. No demarcated cortical infarct. No extra-axial fluid collection. No evidence of an intracranial mass. No midline shift. Vascular: No hyperdense vessel. Atherosclerotic calcifications. Skull: No fracture or aggressive osseous lesion. Sinuses/Orbits: No mass or acute finding within the imaged orbits. No significant paranasal sinus disease at the imaged levels. ASPECTS ([place]berta Stroke Program Early CT Score) - Ganglionic level infarction (caudate, lentiform nuclei, internal capsule, insula, M1-M3 cortex): 7 - Supraganglionic infarction (M4-M6 cortex): 3 Total score (0-10 with 10 being normal): 10 (when discounting the chronic right basal ganglia lacunar infarct). No evidence of acute intracranial abnormality. These results  were called by telephone at the time of interpretation on 08/28/2021 at 8:16 pm to provider Dr. Quentin Cornwall, Who verbally acknowledged these results. IMPRESSION: No evidence of acute intracranial abnormality. ASPECTS is 10. Chronic right basal ganglia lacunar infarct. Mild generalized cerebral atrophy. Electronically Signed   By: Kellie Simmering D.O.   On: 08/28/2021 20:17        Scheduled Meds:  aspirin EC  81 mg Oral Daily   calcium-vitamin D  1 tablet Oral Q breakfast   clopidogrel  75 mg Oral Daily   enoxaparin (LOVENOX) injection  40 mg Subcutaneous Q24H   iron polysaccharides  150 mg Oral Daily   levothyroxine  100 mcg Oral Q0600   metoprolol succinate  50 mg Oral Daily   multivitamin with minerals   Oral Daily   potassium chloride  40 mEq Oral Once    rosuvastatin  40 mg Oral Daily   Continuous Infusions:     LOS: 1 day    Time spent: 35 mins     Wyvonnia Dusky, MD [facility] Pager [phone]  If 7PM-7AM, please contact night-coverage [email or web address] 08/30/2021, 8:37 AM

## 2021-08-30 NOTE — Plan of Care (Signed)

## 2021-08-30 NOTE — TOC Initial Note (Signed)
Transition of Care [facility]) - Initial/Assessment Note    Patient Details  Name: Susan Farrell MRN: [id number] Date of Birth: [birth year]  Transition of Care [facility]) CM/SW Contact:    Truddie Hidden, RN Phone Number: 08/30/2021, 3:04 PM  Clinical Narrative:                 Approved for CIR. Per Ottie Glazier auth pending approval. Approval will not be obtained prior to 8/14.         Patient Goals and CMS Choice        Expected Discharge Plan and Services                                                Prior Living Arrangements/Services                       Activities of Daily Living Home Assistive Devices/Equipment: None ADL Screening (condition at time of admission) Patient's cognitive ability adequate to safely complete daily activities?: Yes Is the patient deaf or have difficulty hearing?: No Does the patient have difficulty seeing, even when wearing glasses/contacts?: No Does the patient have difficulty concentrating, remembering, or making decisions?: No Patient able to express need for assistance with ADLs?: Yes Does the patient have difficulty dressing or bathing?: Yes Independently performs ADLs?: No Communication: Needs assistance Is this a change from baseline?: Change from baseline, expected to last <3 days Dressing (OT): Needs assistance Is this a change from baseline?: Change from baseline, expected to last <3days Grooming: Independent Feeding: Independent Bathing: Needs assistance Is this a change from baseline?: Change from baseline, expected to last <3 days Toileting: Needs assistance Is this a change from baseline?: Change from baseline, expected to last <3 days In/Out Bed: Needs assistance Is this a change from baseline?: Change from baseline, expected to last <3 days Walks in Home: Independent Does the patient have difficulty walking or climbing stairs?: Yes Weakness of Legs: Left Weakness of Arms/Hands: None  Permission  Sought/Granted                  Emotional Assessment              Admission diagnosis:  CVA (cerebral vascular accident) [facility]) [I63.9] Patient Active Problem List   Diagnosis Date Noted   Hypertensive urgency 08/29/2021   Dyslipidemia 08/29/2021   Hypothyroidism 08/29/2021   CVA (cerebral vascular accident) (HCC) 08/28/2021   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 [place] [address] Phone: [phone] Fax: [phone]  [facility] [address] ([address]), [place] - [address] ([address]) [place] [address] Phone: [phone] Fax: [phone]     Social Determinants of Health (SDOH) Interventions    Readmission Risk Interventions     No data to display

## 2021-08-30 NOTE — Progress Notes (Signed)
Occupational Therapy Treatment Patient Details Name: Susan Farrell MRN: [id number] DOB: 1945/09/10 Today's Date: 08/30/2021   History of present illness Pt is a 76 y.o. female presenting to hospital 08/28/21 with c/o L LE weakness.  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  Pt admitted with CVA.  PMH includes discoid lupus, htn, dyslipidemia, hypothyroidism, AAA (s/p rupture and repair), tobacco abuse.   OT comments  Upon entering the room, pt seated in recliner chair and agreeable to OT intervention. Pt with several family  members present in room. OT provided pt with red resistive therapy for L UE coordination and strengthening. Pt continues to have difficulty with dexterity and fine motor movements but only has 1 drop from hand during session. She follows commands for exercises and returns demonstrations with min cuing for proper technique. Pt needing assistance with figure four position of B LEs to don B socks. Pt utilizing B UE with increased time and difficulty to don. Pt with min - mod A to remain in midline secondary to L lateral lean with seated ADL tasks. Pt requesting to remain in recliner chair at end of session. All needs within reach. Pt continues to benefit from OT intervention with continued recommendation for intensive rehab program to return to baseline.   Recommendations for follow up therapy are one component of a multi-disciplinary discharge planning process, led by the attending physician.  Recommendations may be updated based on patient status, additional functional criteria and insurance authorization.    Follow Up Recommendations  Acute inpatient rehab (3hours/day)    Assistance Recommended at Discharge Frequent or constant Supervision/Assistance  Patient can return home with the following  A lot of help with walking and/or transfers;A lot of help with bathing/dressing/bathroom;Assistance with cooking/housework;Assist for  transportation;Help with stairs or ramp for entrance   Equipment Recommendations  Other (comment) (defer to next venue of care)       Precautions / Restrictions Precautions Precautions: Fall Restrictions Weight Bearing Restrictions: No       Mobility Bed Mobility               General bed mobility comments: seated in recliner chair         Balance Overall balance assessment: Needs assistance Sitting-balance support: No upper extremity supported, Feet supported Sitting balance-Leahy Scale: Poor Sitting balance - Comments: min - mod A for balance when donning B socks with L lateral lean                                   ADL either performed or assessed with clinical judgement   ADL Overall ADL's : Needs assistance/impaired                     Lower Body Dressing: Maximal assistance                       Vision Patient Visual Report: No change from baseline            Cognition Arousal/Alertness: Awake/alert Behavior During Therapy: WFL for tasks assessed/performed Overall Cognitive Status: Within Functional Limits for tasks assessed                                 General Comments: A&Ox4, able to follow commands, great attention to task. Pt is very  friendly and cooperative.                   Pertinent Vitals/ Pain       Pain Assessment Pain Assessment: No/denies pain         Frequency  Min 4X/week        Progress Toward Goals  OT Goals(current goals can now be found in the care plan section)  Progress towards OT goals: Progressing toward goals  Acute Rehab OT Goals Patient Stated Goal: to go to rehab OT Goal Formulation: With patient/family Time For Goal Achievement: 09/12/21 Potential to Achieve Goals: Good  Plan Discharge plan remains appropriate;Frequency remains appropriate       AM-PAC OT "6 Clicks" Daily Activity     Outcome Measure   Help from another person eating meals?:  None Help from another person taking care of personal grooming?: A Little Help from another person toileting, which includes using toliet, bedpan, or urinal?: A Lot Help from another person bathing (including washing, rinsing, drying)?: A Lot Help from another person to put on and taking off regular upper body clothing?: A Little Help from another person to put on and taking off regular lower body clothing?: A Lot 6 Click Score: 16    End of Session    OT Visit Diagnosis: Unsteadiness on feet (R26.81);Muscle weakness (generalized) (M62.81);Cognitive communication deficit (R41.841);Hemiplegia and hemiparesis Symptoms and signs involving cognitive functions: Cerebral infarction Hemiplegia - Right/Left: Left Hemiplegia - dominant/non-dominant: Non-Dominant Hemiplegia - caused by: Cerebral infarction   Activity Tolerance Patient tolerated treatment well   Patient Left in chair;with call bell/phone within reach;with chair alarm set   Nurse Communication Mobility status        Time: 4497-5300 OT Time Calculation (min): 27 min  Charges: OT General Charges $OT Visit: 1 Visit OT Treatments $Self Care/Home Management : 8-22 mins $Therapeutic Exercise: 8-22 mins  Jackquline Denmark, MS, OTR/L , CBIS ascom [phone]  08/30/21, 12:59 PM

## 2021-08-31 DIAGNOSIS — I10 Essential (primary) hypertension: Secondary | ICD-10-CM | POA: Diagnosis not present

## 2021-08-31 DIAGNOSIS — I639 Cerebral infarction, unspecified: Secondary | ICD-10-CM | POA: Diagnosis not present

## 2021-08-31 DIAGNOSIS — E785 Hyperlipidemia, unspecified: Secondary | ICD-10-CM | POA: Diagnosis not present

## 2021-08-31 LAB — BASIC METABOLIC PANEL
Anion gap: 6 (ref 5–15)
BUN: 13 mg/dL (ref 8–23)
CO2: 23 mmol/L (ref 22–32)
Calcium: 9 mg/dL (ref 8.9–10.3)
Chloride: 111 mmol/L (ref 98–111)
Creatinine, Ser: 0.81 mg/dL (ref 0.44–1.00)
GFR, Estimated: >60 mL/min (ref >60)
Glucose, Bld: 95 mg/dL (ref 70–99)
Potassium: 3.8 mmol/L (ref 3.5–5.1)
Sodium: 140 mmol/L (ref 135–145)

## 2021-08-31 LAB — IRON AND TIBC
Iron: 55 ug/dL (ref 28–170)
Saturation Ratios: 19 % (ref 10.4–31.8)
TIBC: 287 ug/dL (ref 250–450)
UIBC: 232 ug/dL

## 2021-08-31 LAB — CBC
HCT: 32.6 % — ABNORMAL LOW (ref 36.0–46.0)
Hemoglobin: 10.7 g/dL — ABNORMAL LOW (ref 12.0–15.0)
MCH: 25.5 pg — ABNORMAL LOW (ref 26.0–34.0)
MCHC: 32.8 g/dL (ref 30.0–36.0)
MCV: 77.8 fL — ABNORMAL LOW (ref 80.0–100.0)
Platelets: 162 10*3/uL (ref 150–400)
RBC: 4.19 MIL/uL (ref 3.87–5.11)
RDW: 15.3 % (ref 11.5–15.5)
WBC: 4.1 10*3/uL (ref 4.0–10.5)
nRBC: 0 % (ref 0.0–0.2)

## 2021-08-31 LAB — FERRITIN: Ferritin: 104 ng/mL (ref 11–307)

## 2021-08-31 NOTE — Progress Notes (Signed)
Physical Therapy Treatment Patient Details Name: Susan Farrell MRN: [id number] DOB: 1945/04/14 Today's Date: 08/31/2021   History of Present Illness Pt is a 76 y.o. female presenting to hospital 08/28/21 with c/o L LE weakness.  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  Pt admitted with CVA.  PMH includes discoid lupus, htn, dyslipidemia, hypothyroidism, AAA (s/p rupture and repair), tobacco abuse.    PT Comments    Pt seen for PT tx with pt reporting her LLE was jumping last night but negative for clonus during session. Pt is able to complete bed mobility with hospital bed features & using BUE to assist LLE to EOB. Pt reporting improved LLE strength as pt able to demonstrate 3-/5 knee extension, 1/5 hip flexion in sitting but still 0/5 ankle dorsiflexion. PT encouraged pt to continue moving LLE as much as possible throughout the day & on stroke recovery & rehab. PT wrapped L ankle with ace wrap for dorsiflexion assist for increased safety with gait & pt able to complete stand pivot without AD with 1UE support with mod assist. Provided pt with RW & pt able to ambulate 30 ft + 40 ft with min assist with improving LLE advancement but still impaired gait pattern as noted below. Pt does endorse fatigue at end of session but is motivated to participate and improve. Pt remains an excellent CIR candidate to help facilitate return to independent PLOF.      Recommendations for follow up therapy are one component of a multi-disciplinary discharge planning process, led by the attending physician.  Recommendations may be updated based on patient status, additional functional criteria and insurance authorization.  Follow Up Recommendations  Acute inpatient rehab (3hours/day)     Assistance Recommended at Discharge Frequent or constant Supervision/Assistance  Patient can return home with the following A lot of help with walking and/or transfers;A lot of help with  bathing/dressing/bathroom;Assistance with cooking/housework;Assist for transportation;Help with stairs or ramp for entrance   Equipment Recommendations  Rolling walker (2 wheels);BSC/3in1    Recommendations for Other Services       Precautions / Restrictions Precautions Precautions: Fall Precaution Comments: L hemi Restrictions Weight Bearing Restrictions: No     Mobility  Bed Mobility Overal bed mobility: Needs Assistance Bed Mobility: Supine to Sit     Supine to sit: Supervision, HOB elevated     General bed mobility comments: uses BUE to assist LLE to EOB during supine>sit    Transfers   Equipment used: None Transfers: Sit to/from Stand, Bed to chair/wheelchair/BSC Sit to Stand: Min assist (when given RW pt is able to transfer STS with CGA with good awareness of safe hand placement to push to standing)   Step pivot transfers: Mod assist, Max assist (pt reaching for chair armrest for 1UE support with pt completing step pivot bed>recliner withoutAD)            Ambulation/Gait Ambulation/Gait assistance: Min assist Gait Distance (Feet): 30 Feet (+ 40 ft) Assistive device: Rolling walker (2 wheels) Gait Pattern/deviations: Decreased step length - right, Decreased step length - left, Decreased dorsiflexion - left, Decreased stride length Gait velocity: decreased     General Gait Details: decreased heel strike LLE, decreased L hip flexion during swing phase, L LE genu recurvatum in stance phase intermittently with pt able to correct with cuing   Stairs             Wheelchair Mobility    Modified Rankin (Stroke Patients Only)  Balance Overall balance assessment: Needs assistance Sitting-balance support: No upper extremity supported, Feet supported Sitting balance-Leahy Scale: Good Sitting balance - Comments: pt able to sit EOB without LOB   Standing balance support: No upper extremity supported, During functional activity Standing  balance-Leahy Scale: Poor                              Cognition Arousal/Alertness: Awake/alert Behavior During Therapy: WFL for tasks assessed/performed Overall Cognitive Status: Within Functional Limits for tasks assessed                                 General Comments: AxOx4, pleasant, asking appropriate questions, does report she's discouraged she's not back to baseline yet with PT educating pt on stroke recovery        Exercises      General Comments General comments (skin integrity, edema, etc.): PT educated pt on what a stroke is & stroke recovery, importance of rehab with pt satisifed with all answers. Pt also endorses LLE jumping last night, pt negative for LLE clonus.      Pertinent Vitals/Pain Pain Assessment Pain Assessment: No/denies pain    Home Living                          Prior Function            PT Goals (current goals can now be found in the care plan section) Acute Rehab PT Goals Patient Stated Goal: to improve mobility and strength PT Goal Formulation: With patient Time For Goal Achievement: 09/12/21 Potential to Achieve Goals: Good Progress towards PT goals: Progressing toward goals    Frequency    7X/week      PT Plan Current plan remains appropriate    Co-evaluation              AM-PAC PT "6 Clicks" Mobility   Outcome Measure  Help needed turning from your back to your side while in a flat bed without using bedrails?: None Help needed moving from lying on your back to sitting on the side of a flat bed without using bedrails?: A Little Help needed moving to and from a bed to a chair (including a wheelchair)?: A Lot Help needed standing up from a chair using your arms (e.g., wheelchair or bedside chair)?: A Little Help needed to walk in hospital room?: A Lot Help needed climbing 3-5 steps with a railing? : Total 6 Click Score: 15    End of Session Equipment Utilized During Treatment:  Gait belt Activity Tolerance: Patient tolerated treatment well Patient left: in chair;with chair alarm set;with call bell/phone within reach   PT Visit Diagnosis: Other abnormalities of gait and mobility (R26.89);Hemiplegia and hemiparesis;Unsteadiness on feet (R26.81);Muscle weakness (generalized) (M62.81) Hemiplegia - Right/Left: Left Hemiplegia - dominant/non-dominant: Non-dominant Hemiplegia - caused by: Cerebral infarction     Time: 4270-6237 PT Time Calculation (min) (ACUTE ONLY): 30 min  Charges:  $Therapeutic Activity: 8-22 mins $Neuromuscular Re-education: 8-22 mins                     Aleda Grana, PT, DPT 08/31/21, 10:29 AM   Sandi Mariscal 08/31/2021, 10:26 AM

## 2021-08-31 NOTE — Progress Notes (Signed)
Patient moved to new room closer to nurse's station to maintain safety.  Patient moved from 130 to 116 and oriented to new room.

## 2021-08-31 NOTE — Progress Notes (Signed)
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 Continue w/ neuro checks. Continue on plavix, aspirin x 3 weeks & just aspirin monotherapy thereafter as per neuro. PT/OT recs CIR. Echo shows EF 60-65%, grade I diastolic dysfunction, no regional wall motion abnormalities & no evidence of any interatrial shunt  Fall: in pt's room 08/30/21. Pt got up w/o asking for help. Pt did not hit her head and vitals signs were stable. Pt given education on not getting up by herself. No falls today so far. Continue w/ bed/chair alarm   Hypertensive urgency: urgency resolved but still w/ HTN. Continue on metoprolol   Hypokalemia: WNL today    HLD: continue on statin    Hypothyroidism: continue on synthroid   Thrombocytopenia: resolved   Microcytic anemia: etiology unclear. 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 Medically stable for CIR but waiting on placement   Level of care: Med-Surg  Status is: Inpatient Remains inpatient appropriate because: severity of illness     Consultants:  Neuro   Procedures:   Antimicrobials:    Subjective: Pt c/o left leg weakness  Objective: Vitals:   08/30/21 2042 08/30/21 2128 08/31/21 0042 08/31/21 0424  BP: (!) 152/78 (!) 145/81 (!) 141/79 (!) 143/81  Pulse: 65 60 62 62  Resp: 18 18 18 18   Temp: 98.6 F (37 C) 98.7 F (37.1 C) 98.1 F (36.7 C) 98.5 F (36.9 C)  TempSrc: Oral  Oral   SpO2: 100% 100% 100% 98%  Weight:      Height:        Intake/Output Summary (Last 24 hours)  at 08/31/2021 0749 Last data filed at 08/30/2021 1900 Gross per 24 hour  Intake 840 ml  Output --  Net 840 ml   Filed Weights   08/28/21 1954  Weight: 72.1 kg    Examination:  General exam: Appears calm & comfortable  Respiratory system: clear breath sounds b/l. No rales, wheezes Cardiovascular system: S1 & S2+. No rubs or clicks  Gastrointestinal system: Abd is soft, NT, ND & normal bowel sounds Central nervous system: Alert and oriented. Moves all extremities  Psychiatry: Judgement and insight appear normal. Appropriate mood and affect     Data Reviewed: I have personally reviewed following labs and imaging studies  CBC: Recent Labs  Lab 08/28/21 2018 08/29/21 0427 08/30/21 0354 08/31/21 0405  WBC 4.2 4.7 3.6* 4.1  NEUTROABS 2.0  --   --   --   HGB 10.9* 10.6* 10.8* 10.7*  HCT 33.5* 32.5* 32.6* 32.6*  MCV 79.0* 78.1* 78.0* 77.8*  PLT 149* 126* 140* 162   Basic Metabolic Panel: Recent Labs  Lab 08/28/21 2018 08/29/21 0427 08/30/21 0354 08/31/21 0405  NA 141 143 140 140  K 3.6 3.5 3.3* 3.8  CL 106 111 111 111  CO2 27 24 24 23   GLUCOSE 112* 93 88 95  BUN 10 8 8 13   CREATININE 0.99 0.78 0.77 0.81  CALCIUM 9.2 8.6* 8.8* 9.0   GFR: Estimated Creatinine Clearance: 61.9 mL/min (  by C-G formula based on SCr of 0.81 mg/dL). Liver Function Tests: Recent Labs  Lab 08/28/21 2018  AST 21  ALT 10  ALKPHOS 64  BILITOT 0.6  PROT 7.4  ALBUMIN 3.9   No results for input(s): "LIPASE", "AMYLASE" in the last 168 hours. No results for input(s): "AMMONIA" in the last 168 hours. Coagulation Profile: Recent Labs  Lab 08/28/21 2018  INR 1.1   Cardiac Enzymes: No results for input(s): "CKTOTAL", "CKMB", "CKMBINDEX", "TROPONINI" in the last 168 hours. BNP (last 3 results) No results for input(s): "PROBNP" in the last 8760 hours. HbA1C: Recent Labs    08/28/21 2018  HGBA1C 5.7*   CBG: Recent Labs  Lab 08/28/21 1953  GLUCAP 124*   Lipid Profile: Recent  Labs    08/29/21 0427  CHOL 137  HDL 34*  LDLCALC 90  TRIG 63  CHOLHDL 4.0   Thyroid Function Tests: No results for input(s): "TSH", "T4TOTAL", "FREET4", "T3FREE", "THYROIDAB" in the last 72 hours. Anemia Panel: Recent Labs    08/31/21 0405  FERRITIN 104  TIBC 287  IRON 55   Sepsis Labs: No results for input(s): "PROCALCITON", "LATICACIDVEN" in the last 168 hours.  Recent Results (from the past 240 hour(s))  Resp Panel by RT-PCR (Flu A&B, Covid) Anterior Nasal Swab     Status: None   Collection Time: 08/28/21  8:18 PM   Specimen: Anterior Nasal Swab  Result Value Ref Range Status   SARS Coronavirus 2 by RT PCR NEGATIVE NEGATIVE Final    Comment: (NOTE) SARS-CoV-2 target nucleic acids are NOT DETECTED.  The SARS-CoV-2 RNA is generally detectable in upper respiratory specimens during the acute phase of infection. The lowest concentration of SARS-CoV-2 viral copies this assay can detect is 138 copies/mL. A negative result does not preclude SARS-Cov-2 infection and should not be used as the sole basis for treatment or other patient management decisions. A negative result may occur with  improper specimen collection/handling, submission of specimen other than nasopharyngeal swab, presence of viral mutation(s) within the areas targeted by this assay, and inadequate number of viral copies(<138 copies/mL). A negative result must be combined with clinical observations, patient history, and epidemiological information. The expected result is Negative.  Fact Sheet for Patients:  BloggerCourse.com  Fact Sheet for Healthcare Providers:  SeriousBroker.it  This test is no t yet approved or cleared by the [place] FDA and  has been authorized for detection and/or diagnosis of SARS-CoV-2 by FDA under an Emergency Use Authorization (EUA). This EUA will remain  in effect (meaning this test can be used) for the duration of  the COVID-19 declaration under Section 564(b)(1) of the Act, 21 U.S.C.section 360bbb-3(b)(1), unless the authorization is terminated  or revoked sooner.       Influenza A by PCR NEGATIVE NEGATIVE Final   Influenza B by PCR NEGATIVE NEGATIVE Final    Comment: (NOTE) The Xpert Xpress SARS-CoV-2/FLU/RSV plus assay is intended as an aid in the diagnosis of influenza from Nasopharyngeal swab specimens and should not be used as a sole basis for treatment. Nasal washings and aspirates are unacceptable for Xpert Xpress SARS-CoV-2/FLU/RSV testing.  Fact Sheet for Patients: BloggerCourse.com  Fact Sheet for Healthcare Providers: SeriousBroker.it  This test is not yet approved or cleared by the [place] FDA and has been authorized for detection and/or diagnosis of SARS-CoV-2 by FDA under an Emergency Use Authorization (EUA). This EUA will remain in effect (meaning this test can be used) for the duration of the COVID-19  declaration under Section 564(b)(1) of the Act, 21 U.S.C. section 360bbb-3(b)(1), unless the authorization is terminated or revoked.  Performed at [facility], [address] [place], [place] [address]          Radiology Studies: ECHOCARDIOGRAM COMPLETE BUBBLE STUDY  Result Date: 08/29/2021    ECHOCARDIOGRAM REPORT   Patient Name:   Susan HuntsmanODESSA J Cortopassi Date of Exam: 08/29/2021 Medical Rec #:  [id number]        Height:       66.0 in Accession #:    [id number]       Weight:       159.0 lb Date of Birth:  09/21/1945        BSA:          1.814 m Patient Age:    75 years         BP:           160/81 mmHg Patient Gender: F                HR:           58 bpm. Exam Location:  [facility] Procedure: 2D Echo, Color Doppler, Cardiac Doppler and Saline Contrast Bubble            Study Indications:     I63.9 Stroke  History:         Patient has no prior history of Echocardiogram examinations.                  Risk  Factors:Hypertension, Dyslipidemia and Current Smoker.  Sonographer:     Humphrey RollsJoan Heiss Referring Phys:  [id number] JAN A MANSY Diagnosing Phys: Lorine BearsMuhammad Arida MD IMPRESSIONS  1. Left ventricular ejection fraction, by estimation, is 60 to 65%. The left ventricle has normal function. The left ventricle has no regional wall motion abnormalities. There is moderate left ventricular hypertrophy. Left ventricular diastolic parameters are consistent with Grade I diastolic dysfunction (impaired relaxation).  2. Right ventricular systolic function is normal. The right ventricular size is normal. There is normal pulmonary artery systolic pressure.  3. Left atrial size was mildly dilated.  4. The mitral valve is normal in structure. Trivial mitral valve regurgitation. No evidence of mitral stenosis.  5. The aortic valve is normal in structure. Aortic valve regurgitation is not visualized. No aortic stenosis is present.  6. The inferior vena cava is normal in size with greater than 50% respiratory variability, suggesting right atrial pressure of 3 mmHg.  7. Agitated saline contrast bubble study was negative, with no evidence of any interatrial shunt. FINDINGS  Left Ventricle: Left ventricular ejection fraction, by estimation, is 60 to 65%. The left ventricle has normal function. The left ventricle has no regional wall motion abnormalities. The left ventricular internal cavity size was normal in size. There is  moderate left ventricular hypertrophy. Left ventricular diastolic parameters are consistent with Grade I diastolic dysfunction (impaired relaxation). Right Ventricle: The right ventricular size is normal. No increase in right ventricular wall thickness. Right ventricular systolic function is normal. There is normal pulmonary artery systolic pressure. The tricuspid regurgitant velocity is 2.54 m/s, and  with an assumed right atrial pressure of 3 mmHg, the estimated right ventricular systolic pressure is 28.8 mmHg. Left Atrium:  Left atrial size was mildly dilated. Right Atrium: Right atrial size was normal in size. Pericardium: There is no evidence of pericardial effusion. Mitral Valve: The mitral valve is normal in structure. Trivial mitral valve regurgitation. No evidence of mitral valve  stenosis. Tricuspid Valve: The tricuspid valve is normal in structure. Tricuspid valve regurgitation is trivial. No evidence of tricuspid stenosis. Aortic Valve: The aortic valve is normal in structure. Aortic valve regurgitation is not visualized. No aortic stenosis is present. Aortic valve mean gradient measures 5.0 mmHg. Aortic valve peak gradient measures 9.4 mmHg. Aortic valve area, by VTI measures 3.14 cm. Pulmonic Valve: The pulmonic valve was normal in structure. Pulmonic valve regurgitation is not visualized. No evidence of pulmonic stenosis. Aorta: The aortic root is normal in size and structure. Venous: The inferior vena cava is normal in size with greater than 50% respiratory variability, suggesting right atrial pressure of 3 mmHg. IAS/Shunts: No atrial level shunt detected by color flow Doppler. Agitated saline contrast was given intravenously to evaluate for intracardiac shunting. Agitated saline contrast bubble study was negative, with no evidence of any interatrial shunt.  LEFT VENTRICLE PLAX 2D LVIDd:         4.04 cm   Diastology LVIDs:         2.89 cm   LV e' medial:    4.57 cm/s LV PW:         1.39 cm   LV E/e' medial:  15.4 LV IVS:        0.91 cm   LV e' lateral:   5.87 cm/s LVOT diam:     2.30 cm   LV E/e' lateral: 12.0 LV SV:         95 LV SV Index:   52 LVOT Area:     4.15 cm  RIGHT VENTRICLE RV Basal diam:  3.80 cm LEFT ATRIUM             Index        RIGHT ATRIUM           Index LA diam:        3.30 cm 1.82 cm/m   RA Area:     12.60 cm LA Vol (A2C):   48.3 ml 26.63 ml/m  RA Volume:   28.60 ml  15.77 ml/m LA Vol (A4C):   49.7 ml 27.40 ml/m LA Biplane Vol: 52.8 ml 29.11 ml/m  AORTIC VALVE                     PULMONIC VALVE  AV Area (Vmax):    2.82 cm      PV Vmax:       0.90 m/s AV Area (Vmean):   2.84 cm      PV Peak grad:  3.2 mmHg AV Area (VTI):     3.14 cm AV Vmax:           153.00 cm/s AV Vmean:          103.000 cm/s AV VTI:            0.302 m AV Peak Grad:      9.4 mmHg AV Mean Grad:      5.0 mmHg LVOT Vmax:         104.00 cm/s LVOT Vmean:        70.500 cm/s LVOT VTI:          0.228 m LVOT/AV VTI ratio: 0.75  AORTA Ao Root diam: 3.40 cm MITRAL VALVE               TRICUSPID VALVE MV Area (PHT): 2.15 cm    TR Peak grad:   25.8 mmHg MV Decel Time: 353 msec    TR Vmax:  254.00 cm/s MV E velocity: 70.30 cm/s MV A velocity: 98.10 cm/s  SHUNTS MV E/A ratio:  0.72        Systemic VTI:  0.23 m                            Systemic Diam: 2.30 cm Lorine Bears MD Electronically signed by Lorine Bears MD Signature Date/Time: 08/29/2021/3:07:55 PM    Final         Scheduled Meds:  aspirin EC  81 mg Oral Daily   calcium-vitamin D  1 tablet Oral Q breakfast   clopidogrel  75 mg Oral Daily   enoxaparin (LOVENOX) injection  40 mg Subcutaneous Q24H   iron polysaccharides  150 mg Oral Daily   levothyroxine  100 mcg Oral Q0600   metoprolol succinate  50 mg Oral Daily   multivitamin with minerals   Oral Daily   rosuvastatin  40 mg Oral Daily   Continuous Infusions:     LOS: 2 days    Time spent: 25 mins     Charise Killian, MD [facility] Pager [phone]  If 7PM-7AM, please contact night-coverage [email or web address] 08/31/2021, 7:49 AM

## 2021-09-01 DIAGNOSIS — I10 Essential (primary) hypertension: Secondary | ICD-10-CM | POA: Diagnosis not present

## 2021-09-01 DIAGNOSIS — I639 Cerebral infarction, unspecified: Secondary | ICD-10-CM | POA: Diagnosis not present

## 2021-09-01 DIAGNOSIS — E785 Hyperlipidemia, unspecified: Secondary | ICD-10-CM | POA: Diagnosis not present

## 2021-09-01 LAB — BASIC METABOLIC PANEL
Anion gap: 4 — ABNORMAL LOW (ref 5–15)
BUN: 11 mg/dL (ref 8–23)
CO2: 26 mmol/L (ref 22–32)
Calcium: 8.8 mg/dL — ABNORMAL LOW (ref 8.9–10.3)
Chloride: 112 mmol/L — ABNORMAL HIGH (ref 98–111)
Creatinine, Ser: 0.72 mg/dL (ref 0.44–1.00)
GFR, Estimated: >60 mL/min (ref >60)
Glucose, Bld: 96 mg/dL (ref 70–99)
Potassium: 3.8 mmol/L (ref 3.5–5.1)
Sodium: 142 mmol/L (ref 135–145)

## 2021-09-01 LAB — CBC
HCT: 33.1 % — ABNORMAL LOW (ref 36.0–46.0)
Hemoglobin: 11.1 g/dL — ABNORMAL LOW (ref 12.0–15.0)
MCH: 26.1 pg (ref 26.0–34.0)
MCHC: 33.5 g/dL (ref 30.0–36.0)
MCV: 77.9 fL — ABNORMAL LOW (ref 80.0–100.0)
Platelets: 154 10*3/uL (ref 150–400)
RBC: 4.25 MIL/uL (ref 3.87–5.11)
RDW: 15.1 % (ref 11.5–15.5)
WBC: 4 10*3/uL (ref 4.0–10.5)
nRBC: 0 % (ref 0.0–0.2)

## 2021-09-01 NOTE — Progress Notes (Signed)
Physical Therapy Treatment Patient Details Name: Susan Farrell MRN: [id number] DOB: [birth year] Today's Date: 09/01/2021   History of Present Illness Pt is a 76 y.o. female presenting to hospital 08/28/21 with c/o L LE weakness.  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  Pt admitted with CVA.  PMH includes discoid lupus, htn, dyslipidemia, hypothyroidism, AAA (s/p rupture and repair), tobacco abuse.    PT Comments    Pt seen for PT tx with pt agreeable. Pt continues to voice she wishes recovery was quicker with PT providing ongoing encouragement. PT wrapped LLE in ace wrap for dorsiflexion assist as no active dorsiflexion noted yet. Attempted ambulation without AD but pt continues to reach for RUE support & requires mod assist. Provided pt with RW & pt able to ambulate ~35 ft with min assist, limited by LLE weakness. Pt engaged in LLE strengthening exercises focus on hip flexion. Pt remains an excellent CIR candidate as she can tolerate & will benefit from intensive therapies to help facilitate return to PLOF.    Recommendations for follow up therapy are one component of a multi-disciplinary discharge planning process, led by the attending physician.  Recommendations may be updated based on patient status, additional functional criteria and insurance authorization.  Follow Up Recommendations  Acute inpatient rehab (3hours/day)     Assistance Recommended at Discharge Frequent or constant Supervision/Assistance  Patient can return home with the following A lot of help with walking and/or transfers;A lot of help with bathing/dressing/bathroom;Assistance with cooking/housework;Assist for transportation;Help with stairs or ramp for entrance   Equipment Recommendations  Rolling walker (2 wheels);BSC/3in1    Recommendations for Other Services       Precautions / Restrictions Precautions Precautions: Fall Precaution Comments: L hemi Restrictions Weight  Bearing Restrictions: No     Mobility  Bed Mobility Overal bed mobility: Needs Assistance Bed Mobility: Supine to Sit     Supine to sit: Supervision, HOB elevated          Transfers Overall transfer level: Needs assistance Equipment used: None Transfers: Sit to/from Stand Sit to Stand: Min assist                Ambulation/Gait Ambulation/Gait assistance: Mod assist Gait Distance (Feet): 10 Feet (+ 35 ft) Assistive device: Rolling walker (2 wheels)   Gait velocity: decreased     General Gait Details: PT wraps L ankle in ace wrap for dorsiflexion assistance (no active movement noted yet). Pt ambulates around bed with mod assist but pt reaching for bed rails for RUE support throughout entirity of gait. PT provides tactile cuing at L knee to prevent buckling. Provided pt with RW & pt ambulates into hallway & back. Ace wrap provides assistance for foot clearance but pt still with impaired hip flexion during swing phase; continue to note L knee genu recurvatum at times. LLE fatigues with gait, limiting gait distance.   Stairs             Wheelchair Mobility    Modified Rankin (Stroke Patients Only)       Balance Overall balance assessment: Needs assistance Sitting-balance support: No upper extremity supported, Feet supported Sitting balance-Leahy Scale: Good Sitting balance - Comments: pt able to sit EOB without LOB   Standing balance support: No upper extremity supported, During functional activity Standing balance-Leahy Scale: Poor  Cognition Arousal/Alertness: Awake/alert Behavior During Therapy: WFL for tasks assessed/performed Overall Cognitive Status: Within Functional Limits for tasks assessed                                 General Comments: AxOx4, pleasant, asking appropriate questions        Exercises Other Exercises Other Exercises: Pt engaged in standing LLE hip flexion x 10 reps  with RUE support & LLE heel slides with AAROM x 10 reps with focus on increasing L hip flexor strengthening.    General Comments General comments (skin integrity, edema, etc.): Pt with slightly improved L hip flexion strength (tested in sitting), also able to achieve full L knee extension ROM in sitting with effort.      Pertinent Vitals/Pain Pain Assessment Pain Assessment: No/denies pain    Home Living                          Prior Function            PT Goals (current goals can now be found in the care plan section) Acute Rehab PT Goals Patient Stated Goal: to improve mobility and strength PT Goal Formulation: With patient Time For Goal Achievement: 09/12/21 Potential to Achieve Goals: Good Progress towards PT goals: Progressing toward goals    Frequency    7X/week      PT Plan Current plan remains appropriate    Co-evaluation              AM-PAC PT "6 Clicks" Mobility   Outcome Measure  Help needed turning from your back to your side while in a flat bed without using bedrails?: None Help needed moving from lying on your back to sitting on the side of a flat bed without using bedrails?: None Help needed moving to and from a bed to a chair (including a wheelchair)?: A Little Help needed standing up from a chair using your arms (e.g., wheelchair or bedside chair)?: A Little Help needed to walk in hospital room?: A Little Help needed climbing 3-5 steps with a railing? 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

## 2021-09-01 NOTE — Progress Notes (Signed)
PROGRESS NOTE    Susan Farrell  KPT:[id number] DOB: February 08, 1945 DOA: 08/28/2021 PCP: Barbette Reichmann, MD  Assessment & Plan:   Principal Problem:   CVA (cerebral vascular accident) [facility]) Active Problems:   Hypertensive urgency   Dyslipidemia   Hypothyroidism  Assessment and Plan: CVA: ischemic nonhemorrhagic right basal ganglia infarct as per MRI brain. Continue w/ neuro checks. Continue on plavix, aspirin x 3 weeks and aspirin monotherapy thereafter as per neuro. PT/OT recs CIR. Echo shows EF 60-65%, grade I diastolic dysfunction, no regional wall motion abnormalities & no evidence of any interatrial shunt  Fall: in pt's room 08/30/21. Pt got up w/o asking for help. Pt did not hit her head and vitals signs were stable. Pt given education on not getting up by herself. Continue w/ bed/chair alarm   Hypertensive urgency: urgency resolved but still w/ HTN. Continue on BB  Hypokalemia: WNL today    HLD: continue on statin    Hypothyroidism: continue on levothyroxine   Thrombocytopenia: resolved   Microcytic anemia: etiology unclear. Iron is WNL. 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 Medically stable for CIR but waiting on placement   Level of care: Med-Surg  Status is: Inpatient Remains inpatient appropriate because: waiting on CIR placement      Consultants:  Neuro   Procedures:   Antimicrobials:    Subjective: Pt c/o fatigue  Objective: Vitals:   08/31/21 1143 08/31/21 1730 08/31/21 2059 09/01/21 0555  BP: (!) 147/89 138/85 (!) 152/69 (!) 156/88  Pulse: (!) 57 61 (!) 56 (!) 59  Resp: 16 18 18 16   Temp: 98.2 F (36.8 C) 98 F (36.7 C) 98.1 F (36.7 C) 98.2 F (36.8 C)  TempSrc:  Oral    SpO2: 98% 99% 100% 98%  Weight:      Height:        Intake/Output Summary (Last 24 hours) at 09/01/2021  0736 Last data filed at 08/31/2021 1233 Gross per 24 hour  Intake 360 ml  Output 350 ml  Net 10 ml   Filed Weights   08/28/21 1954  Weight: 72.1 kg    Examination:  General exam: Appears comfortable  Respiratory system: clear breath sounds b/l.  Cardiovascular system: S1 & S2+. No rubs or clicks Gastrointestinal system: Abd is soft, NT, ND & hypoactive bowel sounds Central nervous system: Alert and oriented. Moves all extremities  Psychiatry: Judgement and insight appears normal. Flat mood and affect     Data Reviewed: I have personally reviewed following labs and imaging studies  CBC: Recent Labs  Lab 08/28/21 2018 08/29/21 0427 08/30/21 0354 08/31/21 0405 09/01/21 0406  WBC 4.2 4.7 3.6* 4.1 4.0  NEUTROABS 2.0  --   --   --   --   HGB 10.9* 10.6* 10.8* 10.7* 11.1*  HCT 33.5* 32.5* 32.6* 32.6* 33.1*  MCV 79.0* 78.1* 78.0* 77.8* 77.9*  PLT 149* 126* 140* 162 154   Basic Metabolic Panel: Recent Labs  Lab 08/28/21 2018 08/29/21 0427 08/30/21 0354 08/31/21 0405 09/01/21 0406  NA 141 143 140 140 142  K 3.6 3.5 3.3* 3.8 3.8  CL 106 111 111 111 112*  CO2 27 24 24 23 26   GLUCOSE 112* 93 88 95 96  BUN 10 8 8 13 11   CREATININE 0.99 0.78 0.77 0.81 0.72  CALCIUM 9.2 8.6* 8.8* 9.0 8.8*   GFR: Estimated Creatinine Clearance: 62.7 mL/min (by C-G formula based on SCr of 0.72 mg/dL). Liver Function Tests: Recent Labs  Lab 08/28/21 2018  AST 21  ALT 10  ALKPHOS 64  BILITOT 0.6  PROT 7.4  ALBUMIN 3.9   No results for input(s): "LIPASE", "AMYLASE" in the last 168 hours. No results for input(s): "AMMONIA" in the last 168 hours. Coagulation Profile: Recent Labs  Lab 08/28/21 2018  INR 1.1   Cardiac Enzymes: No results for input(s): "CKTOTAL", "CKMB", "CKMBINDEX", "TROPONINI" in the last 168 hours. BNP (last 3 results) No results for input(s): "PROBNP" in the last 8760 hours. HbA1C: No results for input(s): "HGBA1C" in the last 72 hours.  CBG: Recent Labs   Lab 08/28/21 1953  GLUCAP 124*   Lipid Profile: No results for input(s): "CHOL", "HDL", "LDLCALC", "TRIG", "CHOLHDL", "LDLDIRECT" in the last 72 hours.  Thyroid Function Tests: No results for input(s): "TSH", "T4TOTAL", "FREET4", "T3FREE", "THYROIDAB" in the last 72 hours. Anemia Panel: Recent Labs    08/31/21 0405  FERRITIN 104  TIBC 287  IRON 55   Sepsis Labs: No results for input(s): "PROCALCITON", "LATICACIDVEN" in the last 168 hours.  Recent Results (from the past 240 hour(s))  Resp Panel by RT-PCR (Flu A&B, Covid) Anterior Nasal Swab     Status: None   Collection Time: 08/28/21  8:18 PM   Specimen: Anterior Nasal Swab  Result Value Ref Range Status   SARS Coronavirus 2 by RT PCR NEGATIVE NEGATIVE Final    Comment: (NOTE) SARS-CoV-2 target nucleic acids are NOT DETECTED.  The SARS-CoV-2 RNA is generally detectable in upper respiratory specimens during the acute phase of infection. The lowest concentration of SARS-CoV-2 viral copies this assay can detect is 138 copies/mL. A negative result does not preclude SARS-Cov-2 infection and should not be used as the sole basis for treatment or other patient management decisions. A negative result may occur with  improper specimen collection/handling, submission of specimen other than nasopharyngeal swab, presence of viral mutation(s) within the areas targeted by this assay, and inadequate number of viral copies(<138 copies/mL). A negative result must be combined with clinical observations, patient history, and epidemiological information. The expected result is Negative.  Fact Sheet for Patients:  BloggerCourse.com  Fact Sheet for Healthcare Providers:  SeriousBroker.it  This test is no t yet approved or cleared by the [place] FDA and  has been authorized for detection and/or diagnosis of SARS-CoV-2 by FDA under an Emergency Use Authorization (EUA). This EUA will  remain  in effect (meaning this test can be used) for the duration of the COVID-19 declaration under Section 564(b)(1) of the Act, 21 U.S.C.section 360bbb-3(b)(1), unless the authorization is terminated  or revoked sooner.       Influenza A by PCR NEGATIVE NEGATIVE Final   Influenza B by PCR NEGATIVE NEGATIVE Final    Comment: (NOTE) The Xpert Xpress SARS-CoV-2/FLU/RSV plus assay is intended as an aid in the diagnosis of influenza from Nasopharyngeal swab specimens and should not be used as a sole basis for treatment. Nasal washings and aspirates are unacceptable for Xpert Xpress SARS-CoV-2/FLU/RSV testing.  Fact Sheet for Patients: BloggerCourse.com  Fact Sheet for Healthcare Providers: SeriousBroker.it  This test is not yet approved or cleared by the [place] FDA and has been authorized for detection and/or diagnosis of SARS-CoV-2 by FDA under an Emergency Use Authorization (EUA). This EUA will remain in effect (meaning this test can be used) for  the duration of the COVID-19 declaration under Section 564(b)(1) of the Act, 21 U.S.C. section 360bbb-3(b)(1), unless the authorization is terminated or revoked.  Performed at [facility], [address], [place], [place] [address]          Radiology Studies: No results found.      Scheduled Meds:  aspirin EC  81 mg Oral Daily   calcium-vitamin D  1 tablet Oral Q breakfast   clopidogrel  75 mg Oral Daily   enoxaparin (LOVENOX) injection  40 mg Subcutaneous Q24H   iron polysaccharides  150 mg Oral Daily   levothyroxine  100 mcg Oral Q0600   metoprolol succinate  50 mg Oral Daily   multivitamin with minerals   Oral Daily   rosuvastatin  40 mg Oral Daily   Continuous Infusions:     LOS: 3 days    Time spent: 26 mins     Charise Killian, MD [facility] Pager [phone]  If 7PM-7AM, please contact  night-coverage [email or web address] 09/01/2021, 7:36 AM

## 2021-09-02 ENCOUNTER — Inpatient Hospital Stay (HOSPITAL_COMMUNITY)
Admission: RE | Admit: 2021-09-02 | Discharge: 2021-09-13 | DRG: 057 | Disposition: A | Discharge status: Home / Self Care | Payer: Medicare HMO | Source: Other Acute Inpatient Hospital | Attending: Physical Medicine & Rehabilitation | Admitting: Physical Medicine & Rehabilitation

## 2021-09-02 ENCOUNTER — Other Ambulatory Visit: Payer: Self-pay

## 2021-09-02 ENCOUNTER — Encounter (HOSPITAL_COMMUNITY): Payer: Self-pay | Admitting: Physical Medicine & Rehabilitation

## 2021-09-02 DIAGNOSIS — Z8673 Personal history of transient ischemic attack (TIA), and cerebral infarction without residual deficits: Secondary | ICD-10-CM | POA: Diagnosis not present

## 2021-09-02 DIAGNOSIS — I69398 Other sequelae of cerebral infarction: Secondary | ICD-10-CM | POA: Diagnosis not present

## 2021-09-02 DIAGNOSIS — I69354 Hemiplegia and hemiparesis following cerebral infarction affecting left non-dominant side: Principal | ICD-10-CM

## 2021-09-02 DIAGNOSIS — I639 Cerebral infarction, unspecified: Secondary | ICD-10-CM | POA: Diagnosis not present

## 2021-09-02 DIAGNOSIS — K5900 Constipation, unspecified: Secondary | ICD-10-CM | POA: Diagnosis present

## 2021-09-02 DIAGNOSIS — Z79899 Other long term (current) drug therapy: Secondary | ICD-10-CM | POA: Diagnosis not present

## 2021-09-02 DIAGNOSIS — F17210 Nicotine dependence, cigarettes, uncomplicated: Secondary | ICD-10-CM | POA: Diagnosis not present

## 2021-09-02 DIAGNOSIS — Z8679 Personal history of other diseases of the circulatory system: Secondary | ICD-10-CM | POA: Diagnosis not present

## 2021-09-02 DIAGNOSIS — I6381 Other cerebral infarction due to occlusion or stenosis of small artery: Secondary | ICD-10-CM

## 2021-09-02 DIAGNOSIS — D649 Anemia, unspecified: Secondary | ICD-10-CM | POA: Diagnosis not present

## 2021-09-02 DIAGNOSIS — E785 Hyperlipidemia, unspecified: Secondary | ICD-10-CM | POA: Diagnosis not present

## 2021-09-02 DIAGNOSIS — Z83438 Family history of other disorder of lipoprotein metabolism and other lipidemia: Secondary | ICD-10-CM

## 2021-09-02 DIAGNOSIS — Z8249 Family history of ischemic heart disease and other diseases of the circulatory system: Secondary | ICD-10-CM | POA: Diagnosis not present

## 2021-09-02 DIAGNOSIS — Z79890 Hormone replacement therapy (postmenopausal): Secondary | ICD-10-CM | POA: Diagnosis not present

## 2021-09-02 DIAGNOSIS — K5901 Slow transit constipation: Secondary | ICD-10-CM | POA: Diagnosis not present

## 2021-09-02 DIAGNOSIS — L930 Discoid lupus erythematosus: Secondary | ICD-10-CM | POA: Diagnosis present

## 2021-09-02 DIAGNOSIS — E039 Hypothyroidism, unspecified: Secondary | ICD-10-CM | POA: Diagnosis present

## 2021-09-02 DIAGNOSIS — I10 Essential (primary) hypertension: Secondary | ICD-10-CM | POA: Diagnosis present

## 2021-09-02 DIAGNOSIS — D509 Iron deficiency anemia, unspecified: Secondary | ICD-10-CM | POA: Diagnosis not present

## 2021-09-02 LAB — CREATININE, SERUM
Creatinine, Ser: 0.84 mg/dL (ref 0.44–1.00)
GFR, Estimated: >60 mL/min (ref >60)

## 2021-09-02 LAB — CBC
HCT: 32.7 % — ABNORMAL LOW (ref 36.0–46.0)
HCT: 33.7 % — ABNORMAL LOW (ref 36.0–46.0)
Hemoglobin: 10.9 g/dL — ABNORMAL LOW (ref 12.0–15.0)
Hemoglobin: 10.8 g/dL — ABNORMAL LOW (ref 12.0–15.0)
MCH: 26.1 pg (ref 26.0–34.0)
MCH: 25.5 pg — ABNORMAL LOW (ref 26.0–34.0)
MCHC: 33.3 g/dL (ref 30.0–36.0)
MCHC: 32 g/dL (ref 30.0–36.0)
MCV: 78.2 fL — ABNORMAL LOW (ref 80.0–100.0)
MCV: 79.5 fL — ABNORMAL LOW (ref 80.0–100.0)
Platelets: 176 10*3/uL (ref 150–400)
Platelets: 171 10*3/uL (ref 150–400)
RBC: 4.18 MIL/uL (ref 3.87–5.11)
RBC: 4.24 MIL/uL (ref 3.87–5.11)
RDW: 15.4 % (ref 11.5–15.5)
RDW: 15.6 % — ABNORMAL HIGH (ref 11.5–15.5)
WBC: 4.3 10*3/uL (ref 4.0–10.5)
WBC: 4.1 10*3/uL (ref 4.0–10.5)
nRBC: 0 % (ref 0.0–0.2)
nRBC: 0 % (ref 0.0–0.2)

## 2021-09-02 LAB — BASIC METABOLIC PANEL
Anion gap: 4 — ABNORMAL LOW (ref 5–15)
BUN: 12 mg/dL (ref 8–23)
CO2: 25 mmol/L (ref 22–32)
Calcium: 8.8 mg/dL — ABNORMAL LOW (ref 8.9–10.3)
Chloride: 112 mmol/L — ABNORMAL HIGH (ref 98–111)
Creatinine, Ser: 0.79 mg/dL (ref 0.44–1.00)
GFR, Estimated: >60 mL/min (ref >60)
Glucose, Bld: 97 mg/dL (ref 70–99)
Potassium: 4 mmol/L (ref 3.5–5.1)
Sodium: 141 mmol/L (ref 135–145)

## 2021-09-02 MED ORDER — ONDANSETRON HCL 4 MG PO TABS: 4.0000 mg | ORAL_TABLET | Freq: Four times a day (QID) | ORAL | Status: DC | PRN

## 2021-09-02 MED ORDER — OYSTER SHELL CALCIUM/D3 500-5 MG-MCG PO TABS
1.0000 | ORAL_TABLET | Freq: Every day | ORAL | Status: DC
  Administered 2021-09-03 – 2021-09-13 (×11): 1 via ORAL
  Filled 2021-09-02 (×11): qty 1

## 2021-09-02 MED ORDER — METOPROLOL SUCCINATE ER 50 MG PO TB24
50.0000 mg | ORAL_TABLET | Freq: Every day | ORAL | Status: DC
Start: 2021-09-03 — End: 2021-09-13
  Administered 2021-09-03 – 2021-09-13 (×11): 50 mg via ORAL
  Filled 2021-09-02 (×12): qty 1

## 2021-09-02 MED ORDER — TRAZODONE HCL 50 MG PO TABS: 25.0000 mg | ORAL_TABLET | Freq: Every evening | ORAL | Status: DC | PRN

## 2021-09-02 MED ORDER — ENOXAPARIN SODIUM 40 MG/0.4ML IJ SOSY
40.0000 mg | PREFILLED_SYRINGE | INTRAMUSCULAR | Status: DC
  Administered 2021-09-03 – 2021-09-13 (×11): 40 mg via SUBCUTANEOUS
  Filled 2021-09-02 (×11): qty 0.4

## 2021-09-02 MED ORDER — CLOPIDOGREL BISULFATE 75 MG PO TABS: 75.0000 mg | ORAL_TABLET | Freq: Every day | ORAL | 0 refills | Status: DC

## 2021-09-02 MED ORDER — POLYSACCHARIDE IRON COMPLEX 150 MG PO CAPS
150.0000 mg | ORAL_CAPSULE | Freq: Every day | ORAL | Status: DC
  Administered 2021-09-03 – 2021-09-13 (×11): 150 mg via ORAL
  Filled 2021-09-02 (×11): qty 1

## 2021-09-02 MED ORDER — CLOPIDOGREL BISULFATE 75 MG PO TABS
75.0000 mg | ORAL_TABLET | Freq: Every day | ORAL | Status: DC
  Administered 2021-09-03 – 2021-09-13 (×11): 75 mg via ORAL
  Filled 2021-09-02 (×11): qty 1

## 2021-09-02 MED ORDER — ACETAMINOPHEN 325 MG PO TABS
650.0000 mg | ORAL_TABLET | Freq: Four times a day (QID) | ORAL | Status: DC | PRN
  Administered 2021-09-03 – 2021-09-10 (×13): 650 mg via ORAL
  Filled 2021-09-02 (×13): qty 2

## 2021-09-02 MED ORDER — ENOXAPARIN SODIUM 40 MG/0.4ML IJ SOSY: 40.0000 mg | PREFILLED_SYRINGE | INTRAMUSCULAR | Status: DC

## 2021-09-02 MED ORDER — ROSUVASTATIN CALCIUM 20 MG PO TABS
40.0000 mg | ORAL_TABLET | Freq: Every day | ORAL | Status: DC
  Administered 2021-09-03 – 2021-09-13 (×11): 40 mg via ORAL
  Filled 2021-09-02 (×12): qty 2

## 2021-09-02 MED ORDER — ONDANSETRON HCL 4 MG/2ML IJ SOLN: 4.0000 mg | Freq: Four times a day (QID) | INTRAMUSCULAR | Status: DC | PRN

## 2021-09-02 MED ORDER — ADULT MULTIVITAMIN W/MINERALS CH
1.0000 | ORAL_TABLET | Freq: Every day | ORAL | Status: DC
  Administered 2021-09-03 – 2021-09-13 (×11): 1 via ORAL
  Filled 2021-09-02 (×13): qty 1

## 2021-09-02 MED ORDER — ACETAMINOPHEN 650 MG RE SUPP
650.0000 mg | Freq: Four times a day (QID) | RECTAL | Status: DC | PRN
  Filled 2021-09-02: qty 1

## 2021-09-02 MED ORDER — ASPIRIN 81 MG PO TBEC
81.0000 mg | DELAYED_RELEASE_TABLET | Freq: Every day | ORAL | Status: DC
  Administered 2021-09-03 – 2021-09-13 (×11): 81 mg via ORAL
  Filled 2021-09-02 (×11): qty 1

## 2021-09-02 MED ORDER — ROSUVASTATIN CALCIUM 40 MG PO TABS: 40.0000 mg | ORAL_TABLET | Freq: Every day | ORAL | 0 refills | Status: DC

## 2021-09-02 MED ORDER — ASPIRIN 81 MG PO TBEC: 81.0000 mg | DELAYED_RELEASE_TABLET | Freq: Every day | ORAL | Status: DC

## 2021-09-02 MED ORDER — LEVOTHYROXINE SODIUM 100 MCG PO TABS
100.0000 ug | ORAL_TABLET | Freq: Every day | ORAL | Status: DC
  Administered 2021-09-03 – 2021-09-13 (×11): 100 ug via ORAL
  Filled 2021-09-02 (×11): qty 1

## 2021-09-02 NOTE — H&P (Signed)
Physical Medicine and Rehabilitation Admission H&P    Chief Complaint  Patient presents with   Extremity Weakness  : HPI: Susan Farrell is a 76 year old right-handed female with history of discoid lupus, hypertension, hyperlipidemia, hypothyroidism, abdominal aortic aneurysm status post rupture and repair, tobacco use.  Per chart review patient lives with spouse.  1 level home 2 steps to entry.  Independent prior to admission.  Presented to [facility]/28/[facility] with acute onset of left-sided weakness.  Denied any headache or dizziness or blurred vision.  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  Underlying age-related cerebral atrophy with mild chronic small vessel ischemic disease with additional chronic right basal ganglia lacunar infarction.  CT angiogram head and neck showed internal carotid and vertebral arteries to be patent.  There was a two-point centimeter left thyroid lobe nodule previously evaluated by ultrasound 02/08/2013.  No intracranial large vessel occlusion or high-grade stenosis.  1-2 mm ventrally projecting vascular protrusion arising from the cavernous right ICA suspicious for aneurysm.  Admission chemistries unremarkable except glucose 112, hemoglobin A1c 5.7, urine drug screen negative.  Echocardiogram with ejection fraction of 60 to 65% no wall motion abnormalities grade 1 diastolic dysfunction.  Currently maintained on low-dose aspirin 81 mg daily as well as Plavix for CVA prophylaxis x3 weeks followed by aspirin monotherapy.  Subcutaneous Lovenox for DVT prophylaxis.  Hospital course patient did sustain fall 08/30/2021 when attempting to get out of bed without assistance with no injury sustained.  Tolerating a regular consistency diet.  Therapy evaluations completed due to patient's left-sided weakness decreased functional mobility was admitted for a comprehensive rehab program.  Review of Systems  Constitutional:  Negative for chills and  fever.  HENT:  Negative for hearing loss.   Eyes:  Negative for blurred vision and double vision.  Respiratory:  Negative for cough, shortness of breath and wheezing.   Cardiovascular:  Negative for chest pain, palpitations and leg swelling.  Gastrointestinal:  Positive for constipation. Negative for heartburn, nausea and vomiting.  Genitourinary:  Negative for dysuria, flank pain and hematuria.  Musculoskeletal:  Positive for joint pain and myalgias.  Skin:  Negative for rash.  Neurological:  Positive for weakness.  All other systems reviewed and are negative.  No past medical history on file. Past Surgical History:  Procedure Laterality Date   ABDOMINAL HYSTERECTOMY     CHOLECYSTECTOMY     ENDOVASCULAR REPAIR/STENT GRAFT N/A 06/12/2020   Procedure: ENDOVASCULAR REPAIR/STENT GRAFT;  Surgeon: Renford Dills, MD;  Location: [facility] INVASIVE CV LAB;  Service: Cardiovascular;  Laterality: N/A;   Family History  Problem Relation Age of Onset   Breast cancer Neg Hx    Social History:  reports that she has been smoking cigarettes. She uses smokeless tobacco. She reports that she does not currently use alcohol. She reports that she does not use drugs. Allergies: No Known Allergies Medications Prior to Admission  Medication Sig Dispense Refill   Calcium Carbonate-Vitamin D 600-200 MG-UNIT TABS Take 1 tablet by mouth daily.     docusate sodium (COLACE) 100 MG capsule Take 100 mg by mouth daily.     Iron Combinations (NIFEREX) TABS Take 1 tablet by mouth daily.     metoprolol succinate (TOPROL-XL) 50 MG 24 hr tablet Take 1 tablet by mouth daily.     Multiple Vitamins-Minerals (WOMENS MULTIVITAMIN + COLLAGEN PO) Take 1 tablet by mouth daily.     rosuvastatin (CRESTOR) 5 MG tablet Take 1 tablet by  mouth daily.     SYNTHROID 100 MCG tablet Take 100 mcg by mouth daily.        Home: Home Living Family/patient expects to be discharged to:: Private residence Living Arrangements:  Spouse/significant other Available Help at Discharge: Family, Available 24 hours/day Type of Home: House Home Access: Stairs to enter [employer] of Steps: 2 Entrance Stairs-Rails: None Home Layout: One level Bathroom Shower/Tub: Multimedia programmer: Handicapped height Bathroom Accessibility: Yes Home Equipment: Civil engineer, contracting - built in Additional Comments: retired from lab at [employer]  Lives With: Spouse   Functional History: Prior Function Prior Level of Function : Independent/Modified Independent Mobility Comments: Independent with no AD. No recent falls. ADLs Comments: Independent. Pt and spouse retired, has 2 sisters close by, still drives.  Functional Status:  Mobility: Bed Mobility Overal bed mobility: Needs Assistance Bed Mobility: Supine to Sit Supine to sit: Supervision, HOB elevated Sit to supine: Mod assist General bed mobility comments: uses BUE to assist LLE to EOB during supine>sit Transfers Overall transfer level: Needs assistance Equipment used: None Transfers: Sit to/from Stand Sit to Stand: Min assist Bed to/from chair/wheelchair/BSC transfer type:: Step pivot Step pivot transfers: Mod assist, Max assist (pt reaching for chair armrest for 1UE support with pt completing step pivot bed>recliner withoutAD) General transfer comment: vc's for UE/LE placement (pt using B UE's to move L LE into position); assist to initiate stand and control descent sitting; stand step turn bed to recliner with RW use (manual assist for L knee stabilization and cueing to prevent hyperextension) Ambulation/Gait Ambulation/Gait assistance: Mod assist Gait Distance (Feet): 10 Feet (+ 35 ft) Assistive device: Rolling walker (2 wheels) Gait Pattern/deviations: Decreased step length - right, Decreased step length - left, Decreased dorsiflexion - left, Decreased stride length General Gait Details: PT wraps L ankle in ace wrap for dorsiflexion assistance (no  active movement noted yet). Pt ambulates around bed with mod assist but pt reaching for bed rails for RUE support throughout entirity of gait. PT provides tactile cuing at L knee to prevent buckling. Provided pt with RW & pt ambulates into hallway & back. Ace wrap provides assistance for foot clearance but pt still with impaired hip flexion during swing phase; continue to note L knee genu recurvatum at times. LLE fatigues with gait, limiting gait distance. Gait velocity: decreased    ADL: ADL Overall ADL's : Needs assistance/impaired Grooming: Oral care, Wash/dry hands, Standing, Moderate assistance Grooming Details (indicate cue type and reason): Pt completed grooming tasks standing at sink with Mod A x2 + RW and L knee blocking Lower Body Dressing: Maximal assistance Lower Body Dressing Details (indicate cue type and reason): Max A to doff socks at bed level Functional mobility during ADLs: Moderate assistance, +2 for physical assistance, Rolling walker (2 wheels) General ADL Comments: Pt completed functional mobility at room level with Mod A x2 + RW. Pt demonstrated difficulty with weightshifting and dragging L foot behind, required VC to maintain wide BOS.  Cognition: Cognition Overall Cognitive Status: Within Functional Limits for tasks assessed Arousal/Alertness: Awake/alert Orientation Level: Oriented X4 Attention:  (WFL) Memory: Appears intact Awareness: Appears intact Problem Solving: Appears intact Executive Function: Reasoning Reasoning: Appears intact Safety/Judgment: Appears intact Cognition Arousal/Alertness: Awake/alert Behavior During Therapy: WFL for tasks assessed/performed Overall Cognitive Status: Within Functional Limits for tasks assessed General Comments: AxOx4, pleasant, asking appropriate questions  Physical Exam: Blood pressure (!) 143/75, pulse (!) 54, temperature 97.8 F (36.6 C), temperature source Oral, resp. rate 18, height  5\' 6"  (1.676 m), weight 72.1  kg, SpO2 99 %. Physical Exam Constitutional:      General: She is not in acute distress.    Appearance: Normal appearance. She is not ill-appearing.  HENT:     Head: Normocephalic and atraumatic.     Right Ear: External ear normal.     Left Ear: External ear normal.     Nose: Nose normal.     Mouth/Throat:     Mouth: Mucous membranes are dry.     Pharynx: Oropharynx is clear.  Eyes:     Extraocular Movements: Extraocular movements intact.     Conjunctiva/sclera: Conjunctivae normal.     Pupils: Pupils are equal, round, and reactive to light.  Cardiovascular:     Rate and Rhythm: Regular rhythm. Bradycardia present.     Heart sounds: No murmur heard.    No gallop.  Pulmonary:     Effort: Pulmonary effort is normal.     Breath sounds: Normal breath sounds. No wheezing or rhonchi.  Abdominal:     General: Bowel sounds are normal. There is no distension.     Palpations: Abdomen is soft. There is no mass.     Tenderness: There is no abdominal tenderness.     Hernia: No hernia is present.  Musculoskeletal:        General: No swelling or tenderness.     Cervical back: Normal range of motion.  Skin:    Coloration: Skin is not jaundiced.     Findings: No bruising.  Neurological:     Mental Status: She is alert.     Comments: Patient is alert.  Makes eye contact with examiner.  Follows simple commands.  Provides name and age.  Fair insight and awareness. Mild left central 7 and tongue deviation. STM intact. LUE 4/5 prox to distal. LLE 2- /5 HE, KE, APF and 0/5 ADF. RUE and RLE 4+/5 prox to distal. No focal sensory findings. DTR's 1+, no resting tone.   Psychiatric:        Mood and Affect: Mood normal.        Behavior: Behavior normal.     Results for orders placed or performed during the hospital encounter of 08/28/21 (from the past 48 hour(s))  CBC     Status: Abnormal   Collection Time: 09/01/21  4:06 AM  Result Value Ref Range   WBC 4.0 4.0 - 10.5 K/uL   RBC 4.25 3.87 - 5.11  MIL/uL   Hemoglobin 11.1 (L) 12.0 - 15.0 g/dL   HCT 33.1 (L) 36.0 - 46.0 %   MCV 77.9 (L) 80.0 - 100.0 fL   MCH 26.1 26.0 - 34.0 pg   MCHC 33.5 30.0 - 36.0 g/dL   RDW 15.1 11.5 - 15.5 %   Platelets 154 150 - 400 K/uL   nRBC 0.0 0.0 - 0.2 %    Comment: Performed at [facility], [address] [place], [place] [id number]  Basic metabolic panel     Status: Abnormal   Collection Time: 09/01/21  4:06 AM  Result Value Ref Range   Sodium 142 135 - 145 mmol/L   Potassium 3.8 3.5 - 5.1 mmol/L   Chloride 112 (H) 98 - 111 mmol/L   CO2 26 22 - 32 mmol/L   Glucose, Bld 96 70 - 99 mg/dL    Comment: Glucose reference range applies only to samples taken after fasting for at least 8 hours.   BUN 11 8 - 23 mg/dL  Creatinine, Ser 0.72 0.44 - 1.00 mg/dL   Calcium 8.8 (L) 8.9 - 10.3 mg/dL   GFR, Estimated >00 >93 mL/min    Comment: (NOTE) Calculated using the CKD-EPI Creatinine Equation (2021)    Anion gap 4 (L) 5 - 15    Comment: Performed at [facility], [address] [place], [place] [address]  CBC     Status: Abnormal   Collection Time: 09/02/21  4:17 AM  Result Value Ref Range   WBC 4.3 4.0 - 10.5 K/uL   RBC 4.18 3.87 - 5.11 MIL/uL   Hemoglobin 10.9 (L) 12.0 - 15.0 g/dL   HCT 93.7 (L) 16.9 - 67.8 %   MCV 78.2 (L) 80.0 - 100.0 fL   MCH 26.1 26.0 - 34.0 pg   MCHC 33.3 30.0 - 36.0 g/dL   RDW 93.8 10.1 - 75.1 %   Platelets 176 150 - 400 K/uL   nRBC 0.0 0.0 - 0.2 %    Comment: Performed at [facility], [address] [place], [place] [address]  Basic metabolic panel     Status: Abnormal   Collection Time: 09/02/21  4:17 AM  Result Value Ref Range   Sodium 141 135 - 145 mmol/L   Potassium 4.0 3.5 - 5.1 mmol/L   Chloride 112 (H) 98 - 111 mmol/L   CO2 25 22 - 32 mmol/L   Glucose, Bld 97 70 - 99 mg/dL    Comment: Glucose reference range applies only to samples taken after fasting for at least 8 hours.   BUN 12 8 - 23 mg/dL   Creatinine, Ser 2.77 0.44 -  1.00 mg/dL   Calcium 8.8 (L) 8.9 - 10.3 mg/dL   GFR, Estimated >82 >42 mL/min    Comment: (NOTE) Calculated using the CKD-EPI Creatinine Equation (2021)    Anion gap 4 (L) 5 - 15    Comment: Performed at [facility], [address] [place], [place] [address]   No results found.    Blood pressure (!) 143/75, pulse (!) 54, temperature 97.8 F (36.6 C), temperature source Oral, resp. rate 18, height 5\' 6"  (1.676 m), weight 72.1 kg, SpO2 99 %.  Medical Problem List and Plan: 1. Functional deficits secondary to right basal ganglia infarction  -patient may shower  -ELOS/Goals: 10-14 days, supervision goals with PT and OT  -PRAFO LLE for wear at night 2.  Antithrombotics: -DVT/anticoagulation:  Pharmaceutical: Lovenox  -antiplatelet therapy: Aspirin 81 mg daily and Plavix 75 mg day x3 weeks then aspirin alone 3. Pain Management: Tylenol as needed 4. Mood/Behavior/Sleep: Provide emotional support  -antipsychotic agents: N/A 5. Neuropsych/cognition: This patient is capable of making decisions on her own behalf. 6. Skin/Wound Care: Routine skin checks 7. Fluids/Electrolytes/Nutrition: Routine in and outs with follow-up chemistries 8.  Hypertension.  Toprol-XL 50 mg daily.  Monitor with increased mobility 9.  Hypothyroidism.  Synthroid 10.  Hyperlipidemia.  Lipitor 11.  Microcytic anemia.  Iron is within normal limits.  Follow-up H&H. 12.  History of discoid lupus.  Not currently on any medications per med rec.  Follow-up outpatient. 13.  Tobacco abuse.  Counseling   , PA-C 09/02/2021

## 2021-09-02 NOTE — Progress Notes (Signed)
Patient ID: Susan Farrell, female   DOB: 08/03/1945, 76 y.o.   MRN: [id number]  INPATIENT REHABILITATION ADMISSION NOTE   Arrival Method: Care LInk     Mental Orientation: x4   Assessment: see flowsheet   Skin: CDI   IV'S: n/a   Pain: none reported   Tubes and Drains: n/a   Safety Measures: in place   Vital Signs: see flowsheet   Height and Weight: see flowsheet   Rehab Orientation: completed   Family: notified    Notes: Care Coordinator went over admission and education.  Kennyth Arnold, RN3, BSN, CBIS, CRRN, [facility] Inpatient Rehabilitation

## 2021-09-02 NOTE — Discharge Instructions (Addendum)
Inpatient Rehab Discharge Instructions  Susan Farrell Discharge date and time: No discharge date for patient encounter.   Activities/Precautions/ Functional Status: Activity: activity as tolerated Diet: regular diet Wound Care: Routine skin checks Functional status:  ___ No restrictions     ___ Walk up steps independently ___ 24/7 supervision/assistance   ___ Walk up steps with assistance ___ Intermittent supervision/assistance  ___ Bathe/dress independently ___ Walk with walker     _x__ Bathe/dress with assistance ___ Walk Independently    ___ Shower independently ___ Walk with assistance    ___ Shower with assistance ___ No alcohol     ___ Return to work/school ________   COMMUNITY REFERRALS UPON DISCHARGE:     Outpatient: PT     OT                 Agency: [facility]    Phone: [phone]             Appointment Date/Time: *Please expect follow-up within 7-10 business days. If you have not received follow-up, be sure to contact the site directly.*  Medical Equipment/Items Ordered:rolling walker and 3in1 bedside commode                                                 Agency/Supplier: [employer] [phone]   Special Instructions: No driving smoking or alcohol    STROKE/TIA DISCHARGE INSTRUCTIONS SMOKING Cigarette smoking nearly doubles your risk of having a stroke & is the single most alterable risk factor  If you smoke or have smoked in the last 12 months, you are advised to quit smoking for your health. Most of the excess cardiovascular risk related to smoking disappears within a year of stopping. Ask you doctor about anti-smoking medications [facility] Quit Line: 1-800-QUIT NOW Free Smoking Cessation Classes [phone]  CHOLESTEROL Know your levels; limit fat & cholesterol in your diet  Lipid Panel     Component Value Date/Time   CHOL 137 08/29/2021 0427   TRIG 63 08/29/2021 0427   HDL 34 (L) 08/29/2021 0427   CHOLHDL 4.0 08/29/2021 0427   VLDL  13 08/29/2021 0427   LDLCALC 90 08/29/2021 0427     Many patients benefit from treatment even if their cholesterol is at goal. Goal: Total Cholesterol (CHOL) less than 160 Goal:  Triglycerides (TRIG) less than 150 Goal:  HDL greater than 40 Goal:  LDL (LDLCALC) less than 100   BLOOD PRESSURE American Stroke Association blood pressure target is less that 120/80 mm/Hg  Your discharge blood pressure is:    Monitor your blood pressure Limit your salt and alcohol intake Many individuals will require more than one medication for high blood pressure  DIABETES (A1c is a blood sugar average for last 3 months) Goal HGBA1c is under 7% (HBGA1c is blood sugar average for last 3 months)  Diabetes: No known diagnosis of diabetes    Lab Results  Component Value Date   HGBA1C 5.7 (H) 08/28/2021    Your HGBA1c can be lowered with medications, healthy diet, and exercise. Check your blood sugar as directed by your physician Call your physician if you experience unexplained or low blood sugars.  PHYSICAL ACTIVITY/REHABILITATION Goal is 30 minutes at least 4 days per week  Activity: Increase activity slowly, Therapies: Physical Therapy: Home Health Return to work:  Activity decreases your risk of heart  attack and stroke and makes your heart stronger.  It helps control your weight and blood pressure; helps you relax and can improve your mood. Participate in a regular exercise program. Talk with your doctor about the best form of exercise for you (dancing, walking, swimming, cycling).  DIET/WEIGHT Goal is to maintain a healthy weight  Your discharge diet is:  Diet Order             Diet Heart Room service appropriate? Yes; Fluid consistency: Thin  Diet effective now                   liquids Your height is:  Height: 5\' 6"  (167.6 cm) Your current weight is: Weight: 72.7 kg Your Body Mass Index (BMI) is:  BMI (Calculated): 25.88 Following the type of diet specifically designed for you will help  prevent another stroke. Your goal weight range is:   Your goal Body Mass Index (BMI) is 19-24. Healthy food habits can help reduce 3 risk factors for stroke:  High cholesterol, hypertension, and excess weight.  RESOURCES Stroke/Support Group:  Call [phone]   STROKE EDUCATION PROVIDED/REVIEWED AND GIVEN TO PATIENT Stroke warning signs and symptoms How to activate emergency medical system (call 911). Medications prescribed at discharge. Need for follow-up after discharge. Personal risk factors for stroke. Pneumonia vaccine given: No Flu vaccine given: No My questions have been answered, the writing is legible, and I understand these instructions.  I will adhere to these goals & educational materials that have been provided to me after my discharge from the hospital.       My questions have been answered and I understand these instructions. I will adhere to these goals and the provided educational materials after my discharge from the hospital.  Patient/Caregiver Signature _______________________________ Date __________  Clinician Signature _______________________________________ Date __________  Please bring this form and your medication list with you to all your follow-up doctor's appointments.

## 2021-09-02 NOTE — Progress Notes (Signed)
Patient being discharged to [facility]. Gave report to [place] over the phone. Patient will be transported via [facility].

## 2021-09-02 NOTE — Progress Notes (Signed)
Physical Therapy Treatment Patient Details Name: Susan Farrell MRN: [id number] DOB: March 23, 1945 Today's Date: 09/02/2021   History of Present Illness Pt is a 76 y.o. female presenting to hospital 08/28/21 with c/o L LE weakness.  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  Pt admitted with CVA.  PMH includes discoid lupus, htn, dyslipidemia, hypothyroidism, AAA (s/p rupture and repair), tobacco abuse.    PT Comments    Pt seen this am, anxious to improve and transition to CIR. Pt assisted to EOB, ace wrap to L lower leg to simulate AFO and control foot drop during gait training with RW 65ft with MinA for safety and balance. Pt returned to bedside chair for L LE AA/AROM. Pt unable to complete L hip flexion in sitting without assist or L DF. Pt educated on exercises to perform independently in room. Overall, pt is very motivated and making functional improvements daily. She remains an excellent candidate for CIR placement once insurance clears.    Recommendations for follow up therapy are one component of a multi-disciplinary discharge planning process, led by the attending physician.  Recommendations may be updated based on patient status, additional functional criteria and insurance authorization.  Follow Up Recommendations  Acute inpatient rehab (3hours/day)     Assistance Recommended at Discharge Frequent or constant Supervision/Assistance  Patient can return home with the following A lot of help with walking and/or transfers;A lot of help with bathing/dressing/bathroom;Assistance with cooking/housework;Assist for transportation;Help with stairs or ramp for entrance   Equipment Recommendations  Rolling walker (2 wheels);BSC/3in1    Recommendations for Other Services OT consult;Rehab consult     Precautions / Restrictions Precautions Precautions: Fall Precaution Comments: L hemi Restrictions Weight Bearing Restrictions: No     Mobility  Bed  Mobility Overal bed mobility: Needs Assistance Bed Mobility: Supine to Sit     Supine to sit: Supervision, HOB elevated Sit to supine:  (Raised bed height)   General bed mobility comments: uses BUE to assist LLE to EOB during supine>sit    Transfers Overall transfer level: Needs assistance Equipment used: Rolling walker (2 wheels) Transfers: Sit to/from Stand Sit to Stand: Min assist           General transfer comment:  (Applied ace wrap to L lower leg to simulate AFO and assist in controlling foot drop.)    Ambulation/Gait Ambulation/Gait assistance: Min assist Gait Distance (Feet): 50 Feet Assistive device: Rolling walker (2 wheels) Gait Pattern/deviations: Decreased step length - right, Decreased step length - left, Decreased dorsiflexion - left, Decreased stride length Gait velocity: decreased     General Gait Details: Improved swing through with AFO/ace wrap to L lower leg. Increased fatigue with short distance and noted weakness in L quads at risk for buckling   Stairs             Wheelchair Mobility    Modified Rankin (Stroke Patients Only)       Balance Overall balance assessment: Needs assistance Sitting-balance support: No upper extremity supported, Feet supported Sitting balance-Leahy Scale: Good Sitting balance - Comments: pt able to sit EOB without LOB   Standing balance support: No upper extremity supported, During functional activity Standing balance-Leahy Scale: Poor                              Cognition Arousal/Alertness: Awake/alert Behavior During Therapy: WFL for tasks assessed/performed Overall Cognitive Status: Within Functional Limits for tasks  assessed                                 General Comments: AxOx4, pleasant, asking appropriate questions        Exercises General Exercises - Lower Extremity Ankle Circles/Pumps: AAROM, Strengthening, Left, 10 reps, Seated Gluteal Sets: AROM, 10  reps Long Arc Quad: AAROM, Strengthening, Left, 10 reps, Seated Hip Flexion/Marching: AAROM, Strengthening, Left, 10 reps, Seated    General Comments General comments (skin integrity, edema, etc.): Pt educated on preparing for CIR with good understanding, pt will call husband to bring shoes, clothes, etc.      Pertinent Vitals/Pain Pain Assessment Pain Assessment: No/denies pain    Home Living                          Prior Function            PT Goals (current goals can now be found in the care plan section) Acute Rehab PT Goals Patient Stated Goal: to improve mobility and strength Progress towards PT goals: Progressing toward goals    Frequency    7X/week      PT Plan Current plan remains appropriate    Co-evaluation              AM-PAC PT "6 Clicks" Mobility   Outcome Measure  Help needed turning from your back to your side while in a flat bed without using bedrails?: None Help needed moving from lying on your back to sitting on the side of a flat bed without using bedrails?: None Help needed moving to and from a bed to a chair (including a wheelchair)?: A Little Help needed standing up from a chair using your arms (e.g., wheelchair or bedside chair)?: A Little Help needed to walk in hospital room?: A Little Help needed climbing 3-5 steps with a railing? : A Lot 6 Click Score: 19    End of Session Equipment Utilized During Treatment: Gait belt Activity Tolerance: Patient tolerated treatment well;Patient limited by fatigue Patient left: in chair;with chair alarm set;with call bell/phone within reach Nurse Communication: Mobility status;Precautions PT Visit Diagnosis: Other abnormalities of gait and mobility (R26.89);Hemiplegia and hemiparesis;Unsteadiness on feet (R26.81);Muscle weakness (generalized) (M62.81) Hemiplegia - Right/Left: Left Hemiplegia - dominant/non-dominant: Non-dominant Hemiplegia - caused by: Cerebral infarction     Time:  1025-8527 PT Time Calculation (min) (ACUTE ONLY): 48 min  Charges:  $Gait Training: 8-22 mins $Therapeutic Exercise: 8-22 mins $Therapeutic Activity: 8-22 mins                    Susan Farrell, PTA   Susan Farrell 09/02/2021, 11:01 AM

## 2021-09-02 NOTE — H&P (Signed)
Physical Medicine and Rehabilitation Admission H&P        Chief Complaint  Patient presents with   Extremity Weakness  : HPI: Susan Farrell is a 76 year old right-handed female with history of discoid lupus, hypertension, hyperlipidemia, hypothyroidism, abdominal aortic aneurysm status post rupture and repair, tobacco use.  Per chart review patient lives with spouse.  1 level home 2 steps to entry.  Independent prior to admission.  Presented to [facility] 08/28/2021 with acute onset of left-sided weakness.  Denied any headache or dizziness or blurred vision.  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  Underlying age-related cerebral atrophy with mild chronic small vessel ischemic disease with additional chronic right basal ganglia lacunar infarction.  CT angiogram head and neck showed internal carotid and vertebral arteries to be patent.  There was a two-point centimeter left thyroid lobe nodule previously evaluated by ultrasound 02/08/2013.  No intracranial large vessel occlusion or high-grade stenosis.  1-2 mm ventrally projecting vascular protrusion arising from the cavernous right ICA suspicious for aneurysm.  Admission chemistries unremarkable except glucose 112, hemoglobin A1c 5.7, urine drug screen negative.  Echocardiogram with ejection fraction of 60 to 65% no wall motion abnormalities grade 1 diastolic dysfunction.  Currently maintained on low-dose aspirin 81 mg daily as well as Plavix for CVA prophylaxis x3 weeks followed by aspirin monotherapy.  Subcutaneous Lovenox for DVT prophylaxis.  Hospital course patient did sustain fall 08/30/2021 when attempting to get out of bed without assistance with no injury sustained.  Tolerating a regular consistency diet.  Therapy evaluations completed due to patient's left-sided weakness decreased functional mobility was admitted for a comprehensive rehab program.   Review of Systems  Constitutional:  Negative for  chills and fever.  HENT:  Negative for hearing loss.   Eyes:  Negative for blurred vision and double vision.  Respiratory:  Negative for cough, shortness of breath and wheezing.   Cardiovascular:  Negative for chest pain, palpitations and leg swelling.  Gastrointestinal:  Positive for constipation. Negative for heartburn, nausea and vomiting.  Genitourinary:  Negative for dysuria, flank pain and hematuria.  Musculoskeletal:  Positive for joint pain and myalgias.  Skin:  Negative for rash.  Neurological:  Positive for weakness.  All other systems reviewed and are negative.   No past medical history on file.      Past Surgical History:  Procedure Laterality Date   ABDOMINAL HYSTERECTOMY       CHOLECYSTECTOMY       ENDOVASCULAR REPAIR/STENT GRAFT N/A 06/12/2020    Procedure: ENDOVASCULAR REPAIR/STENT GRAFT;  Surgeon: Renford Dills, MD;  Location: [facility] INVASIVE CV LAB;  Service: Cardiovascular;  Laterality: N/A;         Family History  Problem Relation Age of Onset   Breast cancer Neg Hx      Social History:  reports that she has been smoking cigarettes. She uses smokeless tobacco. She reports that she does not currently use alcohol. She reports that she does not use drugs. Allergies: No Known Allergies       Medications Prior to Admission  Medication Sig Dispense Refill   Calcium Carbonate-Vitamin D 600-200 MG-UNIT TABS Take 1 tablet by mouth daily.       docusate sodium (COLACE) 100 MG capsule Take 100 mg by mouth daily.       Iron Combinations (NIFEREX) TABS Take 1 tablet by mouth daily.       metoprolol succinate (TOPROL-XL) 50 MG 24  hr tablet Take 1 tablet by mouth daily.       Multiple Vitamins-Minerals (WOMENS MULTIVITAMIN + COLLAGEN PO) Take 1 tablet by mouth daily.       rosuvastatin (CRESTOR) 5 MG tablet Take 1 tablet by mouth daily.       SYNTHROID 100 MCG tablet Take 100 mcg by mouth daily.              Home: Home Living Family/patient expects to be discharged  to:: Private residence Living Arrangements: Spouse/significant other Available Help at Discharge: Family, Available 24 hours/day Type of Home: House Home Access: Stairs to enter Entergy CorporationEntrance Stairs-Number of Steps: 2 Entrance Stairs-Rails: None Home Layout: One level Bathroom Shower/Tub: Health visitorWalk-in shower Bathroom Toilet: Handicapped height Bathroom Accessibility: Yes Home Equipment: Information systems managerhower seat - built in Additional Comments: retired from lab at [employer] [facility]  Lives With: Spouse   Functional History: Prior Function Prior Level of Function : Independent/Modified Independent Mobility Comments: Independent with no AD. No recent falls. ADLs Comments: Independent. Pt and spouse retired, has 2 sisters close by, still drives.   Functional Status:  Mobility: Bed Mobility Overal bed mobility: Needs Assistance Bed Mobility: Supine to Sit Supine to sit: Supervision, HOB elevated Sit to supine: Mod assist General bed mobility comments: uses BUE to assist LLE to EOB during supine>sit Transfers Overall transfer level: Needs assistance Equipment used: None Transfers: Sit to/from Stand Sit to Stand: Min assist Bed to/from chair/wheelchair/BSC transfer type:: Step pivot Step pivot transfers: Mod assist, Max assist (pt reaching for chair armrest for 1UE support with pt completing step pivot bed>recliner withoutAD) General transfer comment: vc's for UE/LE placement (pt using B UE's to move L LE into position); assist to initiate stand and control descent sitting; stand step turn bed to recliner with RW use (manual assist for L knee stabilization and cueing to prevent hyperextension) Ambulation/Gait Ambulation/Gait assistance: Mod assist Gait Distance (Feet): 10 Feet (+ 35 ft) Assistive device: Rolling walker (2 wheels) Gait Pattern/deviations: Decreased step length - right, Decreased step length - left, Decreased dorsiflexion - left, Decreased stride length General Gait Details: PT wraps L  ankle in ace wrap for dorsiflexion assistance (no active movement noted yet). Pt ambulates around bed with mod assist but pt reaching for bed rails for RUE support throughout entirity of gait. PT provides tactile cuing at L knee to prevent buckling. Provided pt with RW & pt ambulates into hallway & back. Ace wrap provides assistance for foot clearance but pt still with impaired hip flexion during swing phase; continue to note L knee genu recurvatum at times. LLE fatigues with gait, limiting gait distance. Gait velocity: decreased   ADL: ADL Overall ADL's : Needs assistance/impaired Grooming: Oral care, Wash/dry hands, Standing, Moderate assistance Grooming Details (indicate cue type and reason): Pt completed grooming tasks standing at sink with Mod A x2 + RW and L knee blocking Lower Body Dressing: Maximal assistance Lower Body Dressing Details (indicate cue type and reason): Max A to doff socks at bed level Functional mobility during ADLs: Moderate assistance, +2 for physical assistance, Rolling walker (2 wheels) General ADL Comments: Pt completed functional mobility at room level with Mod A x2 + RW. Pt demonstrated difficulty with weightshifting and dragging L foot behind, required VC to maintain wide BOS.   Cognition: Cognition Overall Cognitive Status: Within Functional Limits for tasks assessed Arousal/Alertness: Awake/alert Orientation Level: Oriented X4 Attention:  (WFL) Memory: Appears intact Awareness: Appears intact Problem Solving: Appears intact Executive Function: Reasoning Reasoning: Appears intact  Safety/Judgment: Appears intact Cognition Arousal/Alertness: Awake/alert Behavior During Therapy: WFL for tasks assessed/performed Overall Cognitive Status: Within Functional Limits for tasks assessed General Comments: AxOx4, pleasant, asking appropriate questions   Physical Exam: Blood pressure (!) 143/75, pulse (!) 54, temperature 97.8 F (36.6 C), temperature source Oral,  resp. rate 18, height 5\' 6"  (1.676 m), weight 72.1 kg, SpO2 99 %. Physical Exam Constitutional:      General: She is not in acute distress.    Appearance: Normal appearance. She is not ill-appearing.  HENT:     Head: Normocephalic and atraumatic.     Right Ear: External ear normal.     Left Ear: External ear normal.     Nose: Nose normal.     Mouth/Throat:     Mouth: Mucous membranes are dry.     Pharynx: Oropharynx is clear.  Eyes:     Extraocular Movements: Extraocular movements intact.     Conjunctiva/sclera: Conjunctivae normal.     Pupils: Pupils are equal, round, and reactive to light.  Cardiovascular:     Rate and Rhythm: Regular rhythm. Bradycardia present.     Heart sounds: No murmur heard.    No gallop.  Pulmonary:     Effort: Pulmonary effort is normal.     Breath sounds: Normal breath sounds. No wheezing or rhonchi.  Abdominal:     General: Bowel sounds are normal. There is no distension.     Palpations: Abdomen is soft. There is no mass.     Tenderness: There is no abdominal tenderness.     Hernia: No hernia is present.  Musculoskeletal:        General: No swelling or tenderness.     Cervical back: Normal range of motion.  Skin:    Coloration: Skin is not jaundiced.     Findings: No bruising.  Neurological:     Mental Status: She is alert.     Comments: Patient is alert.  Makes eye contact with examiner.  Follows simple commands.  Provides name and age.  Fair insight and awareness. Mild left central 7 and tongue deviation. STM intact. LUE 4/5 prox to distal. LLE 2- /5 HE, KE, APF and 0/5 ADF. RUE and RLE 4+/5 prox to distal. No focal sensory findings. DTR's 1+, no resting tone.   Psychiatric:        Mood and Affect: Mood normal.        Behavior: Behavior normal.        Lab Results Last 48 Hours        Results for orders placed or performed during the hospital encounter of 08/28/21 (from the past 48 hour(s))  CBC     Status: Abnormal    Collection Time:  09/01/21  4:06 AM  Result Value Ref Range    WBC 4.0 4.0 - 10.5 K/uL    RBC 4.25 3.87 - 5.11 MIL/uL    Hemoglobin 11.1 (L) 12.0 - 15.0 g/dL    HCT 09/03/21 (L) 43.3 - 46.0 %    MCV 77.9 (L) 80.0 - 100.0 fL    MCH 26.1 26.0 - 34.0 pg    MCHC 33.5 30.0 - 36.0 g/dL    RDW 29.5 18.8 - 41.6 %    Platelets 154 150 - 400 K/uL    nRBC 0.0 0.0 - 0.2 %      Comment: Performed at [facility], [address], [place], [place]  Basic metabolic panel     Status: Abnormal  Collection Time: 09/01/21  4:06 AM  Result Value Ref Range    Sodium 142 135 - 145 mmol/L    Potassium 3.8 3.5 - 5.1 mmol/L    Chloride 112 (H) 98 - 111 mmol/L    CO2 26 22 - 32 mmol/L    Glucose, Bld 96 70 - 99 mg/dL      Comment: Glucose reference range applies only to samples taken after fasting for at least 8 hours.    BUN 11 8 - 23 mg/dL    Creatinine, Ser 4.09 0.44 - 1.00 mg/dL    Calcium 8.8 (L) 8.9 - 10.3 mg/dL    GFR, Estimated >81 >19 mL/min      Comment: (NOTE) Calculated using the CKD-EPI Creatinine Equation (2021)      Anion gap 4 (L) 5 - 15      Comment: Performed at [facility], [address] [place], [place] [address]  CBC     Status: Abnormal    Collection Time: 09/02/21  4:17 AM  Result Value Ref Range    WBC 4.3 4.0 - 10.5 K/uL    RBC 4.18 3.87 - 5.11 MIL/uL    Hemoglobin 10.9 (L) 12.0 - 15.0 g/dL    HCT 95.6 (L) 21.3 - 46.0 %    MCV 78.2 (L) 80.0 - 100.0 fL    MCH 26.1 26.0 - 34.0 pg    MCHC 33.3 30.0 - 36.0 g/dL    RDW 08.6 57.8 - 46.9 %    Platelets 176 150 - 400 K/uL    nRBC 0.0 0.0 - 0.2 %      Comment: Performed at [facility], [address], [place], [place] [address]  Basic metabolic panel     Status: Abnormal    Collection Time: 09/02/21  4:17 AM  Result Value Ref Range    Sodium 141 135 - 145 mmol/L    Potassium 4.0 3.5 - 5.1 mmol/L    Chloride 112 (H) 98 - 111 mmol/L    CO2 25 22 - 32 mmol/L    Glucose, Bld 97 70 - 99 mg/dL      Comment:  Glucose reference range applies only to samples taken after fasting for at least 8 hours.    BUN 12 8 - 23 mg/dL    Creatinine, Ser 8.41 0.44 - 1.00 mg/dL    Calcium 8.8 (L) 8.9 - 10.3 mg/dL    GFR, Estimated >32 >44 mL/min      Comment: (NOTE) Calculated using the CKD-EPI Creatinine Equation (2021)      Anion gap 4 (L) 5 - 15      Comment: Performed at [facility], [address] [place], [place] [address]      Imaging Results (Last 48 hours)  No results found.         Blood pressure (!) 143/75, pulse (!) 54, temperature 97.8 F (36.6 C), temperature source Oral, resp. rate 18, height 5\' 6"  (1.676 m), weight 72.1 kg, SpO2 99 %.   Medical Problem List and Plan: 1. Functional deficits secondary to right basal ganglia infarction             -patient may shower             -ELOS/Goals: 10-14 days, supervision goals with PT and OT             -PRAFO LLE for wear at night 2.  Antithrombotics: -DVT/anticoagulation:  Pharmaceutical: Lovenox             -  antiplatelet therapy: Aspirin 81 mg daily and Plavix 75 mg day x3 weeks then aspirin alone 3. Pain Management: Tylenol as needed 4. Mood/Behavior/Sleep: Provide emotional support             -antipsychotic agents: N/A 5. Neuropsych/cognition: This patient is capable of making decisions on her own behalf. 6. Skin/Wound Care: Routine skin checks 7. Fluids/Electrolytes/Nutrition: Routine in and outs with follow-up chemistries 8.  Hypertension.  Toprol-XL 50 mg daily.  Monitor with increased mobility  -watch for excessive bradycardia 9.  Hypothyroidism.  Synthroid 10.  Hyperlipidemia.  Lipitor 11.  Microcytic anemia.  Iron is within normal limits.  Follow-up H&H. 12.  History of discoid lupus.  Not currently on any medications per med rec.  Follow-up outpatient. 13.  Tobacco abuse.  Counseling     Mcarthur Rossetti Angiulli, PA-C 09/02/2021   I have personally performed a face to face diagnostic evaluation of this patient and  formulated the key components of the plan.  Additionally, I have personally reviewed laboratory data, imaging studies, as well as relevant notes and concur with the physician assistant's documentation above.  The patient's status has not changed from the original H&P.  Any changes in documentation from the acute care chart have been noted above.  Ranelle Oyster, MD, [place]

## 2021-09-02 NOTE — Progress Notes (Addendum)
Inpatient Rehab Admissions Coordinator:    I continue to await insurance auth for CIR but will follow for admit pending authorization.  Megan Salon, MS, CCC-SLP Rehab Admissions Coordinator  [phone] (celll) [phone] (office)

## 2021-09-02 NOTE — TOC Progression Note (Signed)
Transition of Care [facility]) - Progression Note    Patient Details  Name: Susan Farrell MRN: [id number] Date of Birth: 15-Jul-1945  [facility] of [facility]) CM/SW Contact  Caryn Section, RN Phone Number: 09/02/2021, 11:40 AM  Clinical Narrative:   Patient transporting to [facility] Inpatient rehab via [facility] today per laura         Expected Discharge Plan and Services           Expected Discharge Date: 09/02/21                                     Social Determinants of Health (SDOH) Interventions    Readmission Risk Interventions     No data to display

## 2021-09-02 NOTE — Progress Notes (Signed)
Occupational Therapy Treatment Patient Details Name: Susan Farrell MRN: [id number] DOB: April 05, 1945 Today's Date: 09/02/2021   History of present illness Pt is a 76 y.o. female presenting to hospital 08/28/21 with c/o L LE weakness.  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  Pt admitted with CVA.  PMH includes discoid lupus, htn, dyslipidemia, hypothyroidism, AAA (s/p rupture and repair), tobacco abuse.   OT comments  Upon entering the room, pt seated in recliner chair and agreeable to OT intervention. Pt reports having recently finished PT session but is ready to start second therapy session of the day. Pt is very motivated to return to PLOF. Pt stands from recliner chair with mod lifting assistance. Pt ambulates 25' into bathroom without use of RW requiring mod  for functional mobility for weight shift and balance. Pt attempting to reach out and steady herself as she goes. Pt needing mod A for transfer onto regular height toilet. She is able to void and performs hygiene while seated with set up A and supervision. No significant L lateral lean noted this session while on commode. Pt stands with mod A and it given RW. She is able to ambulate to bed with min A and use of RW as she is able to heavily rely on UEs for mobility task. Pt supine and demonstrated bilateral UE coordination task to open drink container with increased time. Pt continues to benefit from OT but remains quite different from baseline. OT continues to recommend intensive inpatient rehab to address deficits before returning home.    Recommendations for follow up therapy are one component of a multi-disciplinary discharge planning process, led by the attending physician.  Recommendations may be updated based on patient status, additional functional criteria and insurance authorization.    Follow Up Recommendations  Acute inpatient rehab (3hours/day)    Assistance Recommended at Discharge Frequent  or constant Supervision/Assistance  Patient can return home with the following  A lot of help with walking and/or transfers;A lot of help with bathing/dressing/bathroom;Assistance with cooking/housework;Assist for transportation;Help with stairs or ramp for entrance   Equipment Recommendations  Other (comment) (defer to next venue of care)       Precautions / Restrictions Precautions Precautions: Fall Precaution Comments: L hemi Restrictions Weight Bearing Restrictions: No       Mobility Bed Mobility Overal bed mobility: Needs Assistance Bed Mobility: Sit to Supine       Sit to supine: Supervision, HOB elevated        Transfers Overall transfer level: Needs assistance   Transfers: Sit to/from Stand Sit to Stand: Min assist, Mod assist     Step pivot transfers: Mod assist           Balance Overall balance assessment: Needs assistance Sitting-balance support: No upper extremity supported, Feet supported Sitting balance-Leahy Scale: Good Sitting balance - Comments: pt able to sit EOB without LOB   Standing balance support: During functional activity, No upper extremity supported Standing balance-Leahy Scale: Poor                             ADL either performed or assessed with clinical judgement   ADL Overall ADL's : Needs assistance/impaired     Grooming: Wash/dry hands;Sitting;Set up;Supervision/safety                   Toilet Transfer: Moderate assistance;Minimal assistance;Rolling walker (2 wheels);Ambulation   Toileting- Clothing Manipulation and Hygiene:  Moderate assistance;Sit to/from stand       Functional mobility during ADLs: Moderate assistance;Rolling walker (2 wheels);Minimal assistance        Cognition Arousal/Alertness: Awake/alert Behavior During Therapy: WFL for tasks assessed/performed Overall Cognitive Status: Within Functional Limits for tasks assessed                                 General  Comments: AxOx4, pleasant, asking appropriate questions              General Comments Pt educated on preparing for CIR with good understanding, pt will call husband to bring shoes, clothes, etc.    Pertinent Vitals/ Pain       Pain Assessment Pain Assessment: No/denies pain         Frequency  Min 4X/week        Progress Toward Goals  OT Goals(current goals can now be found in the care plan section)  Progress towards OT goals: Progressing toward goals  Acute Rehab OT Goals Patient Stated Goal: to go to rehab OT Goal Formulation: With patient/family Time For Goal Achievement: 09/12/21 Potential to Achieve Goals: Good  Plan Discharge plan remains appropriate;Frequency remains appropriate       AM-PAC OT "6 Clicks" Daily Activity     Outcome Measure   Help from another person eating meals?: None Help from another person taking care of personal grooming?: A Little Help from another person toileting, which includes using toliet, bedpan, or urinal?: A Lot Help from another person bathing (including washing, rinsing, drying)?: A Lot Help from another person to put on and taking off regular upper body clothing?: A Little Help from another person to put on and taking off regular lower body clothing?: A Lot 6 Click Score: 16    End of Session Equipment Utilized During Treatment: Gait belt;Rolling walker (2 wheels)  OT Visit Diagnosis: Unsteadiness on feet (R26.81);Muscle weakness (generalized) (M62.81);Cognitive communication deficit (R41.841);Hemiplegia and hemiparesis Symptoms and signs involving cognitive functions: Cerebral infarction Hemiplegia - Right/Left: Left Hemiplegia - dominant/non-dominant: Non-Dominant Hemiplegia - caused by: Cerebral infarction   Activity Tolerance Patient tolerated treatment well   Patient Left with call bell/phone within reach;in bed;with bed alarm set   Nurse Communication Mobility status        Time: 5916-3846 OT Time  Calculation (min): 19 min  Charges: OT General Charges $OT Visit: 1 Visit OT Treatments $Self Care/Home Management : 8-22 mins  Jackquline Denmark, MS, OTR/L , CBIS ascom [phone]  09/02/21, 11:21 AM

## 2021-09-02 NOTE — Progress Notes (Signed)
Inpatient Rehab Admissions Coordinator:    I have a CIR bed for this pt. And can admit today. RN may call report to [phone].  Dalisa Forrer, MS, CCC-SLP Rehab Admissions Coordinator  [phone] (celll) [phone] (office)  

## 2021-09-02 NOTE — Progress Notes (Signed)
Inpatient Rehabilitation Admission Medication Review by a Pharmacist  A complete drug regimen review was completed for this patient to identify any potential clinically significant medication issues.  High Risk Drug Classes Is patient taking? Indication by Medication  Antipsychotic No   Anticoagulant Yes Lovenox- VTE ppx  Antibiotic No   Opioid No   Antiplatelet Yes Aspirin, plavix- CVA ppx  Hypoglycemics/insulin No   Vasoactive Medication Yes Toprol- hypertension  Chemotherapy No   Other Yes Trazodone- sleep Synthroid- hypothyroidism Crestor- HLD     Type of Medication Issue Identified Description of Issue Recommendation(s)  Drug Interaction(s) (clinically significant)     Duplicate Therapy     Allergy     No Medication Administration End Date  Plavix Per neurology- continue DAPT x3 weeks f/b aspirin alone Plavix end date: September 18, 2021  Incorrect Dose     Additional Drug Therapy Needed     Significant med changes from prior encounter (inform family/care partners about these prior to discharge). Crestor dose increase Patient will need the Crestor 5 mg tab medication she has at home addressed on the AVS so she is not taking the 5 mg tabs and the new dose of 40 mg  Other  PTA meds: Colace 100 mg po daily   Crestor 5 mg po daily  Restart PTA meds when and if necessary during CIR admission or at time of discharge, if warranted  Crestor dose changed to 40 mg    Clinically significant medication issues were identified that warrant physician communication and completion of prescribed/recommended actions by midnight of the next day:  No   Time spent performing this drug regimen review (minutes):  30   Tenelle Andreason BS, PharmD, BCPS Clinical Pharmacist 09/02/2021 1:19 PM  Contact: [phone] after 3 PM  "Be curious, not judgmental..." -Debbora Dus

## 2021-09-02 NOTE — Progress Notes (Signed)
PMR Admission Coordinator Pre-Admission Assessment   Patient: Susan Farrell is an 76 y.o., female MRN: [id number] DOB: [birth year] Height: 5\' 6"  (167.6 cm) Weight: 72.1 kg   Insurance Information HMO: yes    PPO:      PCP:      IPA:      80/20:      OTHER:  PRIMARY: [employer]      Policy#:      Subscriber: pt CM Name: [id number]      Phone#: [phone] ext [phone]     Fax#: [phone] Pre-Cert#: [id number]       Employer:  [id number] with [employer] called with  approval on 8/14 for admit 8/14 for 7 days, with updates due 8/21. She requested team conference notes with estimated d/c and d/c plans be sent weekly.  Benefits:  Phone #: [phone]     Name: 08/30/21 Eff. Date: 01/20/21     Deduct: none      Out of Pocket Max: $3400      Life Max: none CIR: $295 co pay per day days 1 until 6      SNF: no copay days 1 until 20; $196 co pay per day days 21 until 100 Outpatient: $10 to $20 per visit     Co-Pay: visits per medical neccesity Home Health: 100%      Co-Pay: visits per medical neccesity DME: 80%     Co-Pay: 20% Providers: in network  SECONDARY: [employer]      Policy#: 03/20/21   Financial Counselor:       Phone#:    The "Data Collection Information Summary" for patients in Inpatient Rehabilitation Facilities with attached "Privacy Act Statement-Health Care Records" was provided and verbally reviewed with: Patient and Family   Emergency Contact Information Contact Information       Name Relation Home Work Mobile    [place] Spouse [phone]   [phone]         Current Medical History  Patient Admitting Diagnosis: CVA HPI: Susan Farrell is a 76 year old right-handed female with history of discoid lupus, hypertension, hyperlipidemia, hypothyroidism, abdominal aortic aneurysm status post rupture and repair, tobacco use.  Per chart review patient lives with spouse.  1 level home 2 steps to entry.  Independent prior to admission.  Presented to [facility]  08/28/2021 with acute onset of left-sided weakness.  Denied any headache or dizziness or blurred vision.  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  Underlying age-related cerebral atrophy with mild chronic small vessel ischemic disease with additional chronic right basal ganglia lacunar infarction.  CT angiogram head and neck showed internal carotid and vertebral arteries to be patent.  There was a two-point centimeter left thyroid lobe nodule previously evaluated by ultrasound 02/08/2013.  No intracranial large vessel occlusion or high-grade stenosis.  1-2 mm ventrally projecting vascular protrusion arising from the cavernous right ICA suspicious for aneurysm.  Admission chemistries unremarkable except glucose 112, hemoglobin A1c 5.7, urine drug screen negative.  Echocardiogram with ejection fraction of 60 to 65% no wall motion abnormalities grade 1 diastolic dysfunction.  Currently maintained on low-dose aspirin 81 mg daily as well as Plavix for CVA prophylaxis x3 weeks followed by aspirin monotherapy.  Subcutaneous Lovenox for DVT prophylaxis.  Hospital course patient did sustain fall 08/30/2021 when attempting to get out of bed without assistance with no injury sustained.  Tolerating a regular consistency diet.  Therapy evaluations completed due to patient's left-sided weakness decreased  functional mobility was admitted for a comprehensive rehab program.     Complete NIHSS TOTAL: 2   [facility] has been reviewed by the rehabilitation admission coordinator and physician.   Past Medical History  No past medical history on file.   Has the patient had major surgery during 100 days prior to admission? No   Family History   family history is not on file.   Current Medications   Current Facility-Administered Medications:    acetaminophen (TYLENOL) tablet 650 mg, 650 mg, Oral, Q6H PRN **OR** acetaminophen (TYLENOL)  suppository 650 mg, 650 mg, Rectal, Q6H PRN, Mansy, Jan A, MD   aspirin EC tablet 81 mg, 81 mg, Oral, Daily, Rejeana BrockKirkpatrick, McNeill P, MD, 81 mg at 09/01/21 0831   calcium-vitamin D (OSCAL WITH D) 500-5 MG-MCG per tablet 1 tablet, 1 tablet, Oral, Q breakfast, Mansy, Jan A, MD, 1 tablet at 09/01/21 0831   [COMPLETED] clopidogrel (PLAVIX) tablet 300 mg, 300 mg, Oral, Once, 300 mg at 08/29/21 1703 **AND** clopidogrel (PLAVIX) tablet 75 mg, 75 mg, Oral, Daily, Rejeana BrockKirkpatrick, McNeill P, MD, 75 mg at 09/01/21 0831   enoxaparin (LOVENOX) injection 40 mg, 40 mg, Subcutaneous, Q24H, Mansy, Jan A, MD, 40 mg at 09/01/21 0831   iron polysaccharides (NIFEREX) capsule 150 mg, 150 mg, Oral, Daily, Mansy, Jan A, MD, 150 mg at 09/01/21 0831   levothyroxine (SYNTHROID) tablet 100 mcg, 100 mcg, Oral, Q0600, Mansy, Jan A, MD, 100 mcg at 09/01/21 [id number]   magnesium hydroxide (MILK OF MAGNESIA) suspension 30 mL, 30 mL, Oral, Daily PRN, Mansy, Jan A, MD   metoprolol succinate (TOPROL-XL) 24 hr tablet 50 mg, 50 mg, Oral, Daily, Mansy, Jan A, MD, 50 mg at 09/01/21 0831   multivitamin with minerals tablet, , Oral, Daily, Mansy, Jan A, MD, 1 tablet at 09/01/21 0831   ondansetron (ZOFRAN) tablet 4 mg, 4 mg, Oral, Q6H PRN **OR** ondansetron (ZOFRAN) injection 4 mg, 4 mg, Intravenous, Q6H PRN, Mansy, Jan A, MD   rosuvastatin (CRESTOR) tablet 40 mg, 40 mg, Oral, Daily, Mayford KnifeWilliams, Jamiese M, MD, 40 mg at 09/01/21 0831   traZODone (DESYREL) tablet 25 mg, 25 mg, Oral, QHS PRN, Mansy, Vernetta HoneyJan A, MD   Patients Current Diet:  Diet Order                  Diet Heart Room service appropriate? Yes; Fluid consistency: Thin  Diet effective now                       Precautions / Restrictions Precautions Precautions: Fall Precaution Comments: L hemi Restrictions Weight Bearing Restrictions: No    Has the patient had 2 or more falls or a fall with injury in the past year? No   Prior Activity Level Community (5-7x/wk): I without AD,  drives , retired   Prior Functional Level Self Care: Did the patient need help bathing, dressing, using the toilet or eating? Independent   Indoor Mobility: Did the patient need assistance with walking from room to room (with or without device)? Independent   Stairs: Did the patient need assistance with internal or external stairs (with or without device)? Independent   Functional Cognition: Did the patient need help planning regular tasks such as shopping or remembering to take medications? Independent   Patient Information Are you of Hispanic, Latino/a,or Spanish origin?: A. No, not of Hispanic, Latino/a, or Spanish origin What is your race?: B. Black or African American Do you  need or want an interpreter to communicate with a doctor or health care staff?: 0. No   Patient's Response To:  Health Literacy and Transportation Is the patient able to respond to health literacy and transportation needs?: Yes Health Literacy - How often do you need to have someone help you when you read instructions, pamphlets, or other written material from your doctor or pharmacy?: Never In the past 12 months, has lack of transportation kept you from medical appointments or from getting medications?: No In the past 12 months, has lack of transportation kept you from meetings, work, or from getting things needed for daily living?: No   Journalist, newspaper / Equipment Home Assistive Devices/Equipment: None Home Equipment: Shower seat - built in   Prior Device Use: Indicate devices/aids used by the patient prior to current illness, exacerbation or injury? None of the above   Current Functional Level Cognition   Arousal/Alertness: Awake/alert Overall Cognitive Status: Within Functional Limits for tasks assessed Orientation Level: Oriented X4 General Comments: AxOx4, pleasant, asking appropriate questions Attention:  Gi Or Norman) Memory: Appears intact Awareness: Appears intact Problem Solving: Appears  intact Executive Function: Reasoning Reasoning: Appears intact Safety/Judgment: Appears intact    Extremity Assessment (includes Sensation/Coordination)   Upper Extremity Assessment: LUE deficits/detail (RUE WFL) LUE Deficits / Details: Shoulder flexion AROM limited to ~90 deg and grossly 120 deg for AAROM. MMT: elbow flexion grossly 4/5, elbow extension grossly 4-/5, grip strength fair. LUE Sensation: WNL (intact to light touch, hot/cold discrimination WFL (able to feel warm water when washing hands)) LUE Coordination: decreased fine motor (decreased speed, dysmetria with finger to nose testing (undershooting), thumb opposition intact)  Lower Extremity Assessment: Defer to PT evaluation LLE Deficits / Details: hip flexion 2+/5; knee extension 4-/5; knee flexion 3+/5; DF 2/5; intact proprioception and light touch; increased L LE tone noted; limited coordination d/t weakness     ADLs   Overall ADL's : Needs assistance/impaired Grooming: Oral care, Wash/dry hands, Standing, Moderate assistance Grooming Details (indicate cue type and reason): Pt completed grooming tasks standing at sink with Mod A x2 + RW and L knee blocking Lower Body Dressing: Maximal assistance Lower Body Dressing Details (indicate cue type and reason): Max A to doff socks at bed level Functional mobility during ADLs: Moderate assistance, +2 for physical assistance, Rolling walker (2 wheels) General ADL Comments: Pt completed functional mobility at room level with Mod A x2 + RW. Pt demonstrated difficulty with weightshifting and dragging L foot behind, required VC to maintain wide BOS.     Mobility   Overal bed mobility: Needs Assistance Bed Mobility: Supine to Sit Supine to sit: Supervision, HOB elevated Sit to supine: Mod assist General bed mobility comments: uses BUE to assist LLE to EOB during supine>sit     Transfers   Overall transfer level: Needs assistance Equipment used: None Transfers: Sit to/from  Stand Sit to Stand: Min assist Bed to/from chair/wheelchair/BSC transfer type:: Step pivot Step pivot transfers: Mod assist, Max assist (pt reaching for chair armrest for 1UE support with pt completing step pivot bed>recliner withoutAD) General transfer comment: vc's for UE/LE placement (pt using B UE's to move L LE into position); assist to initiate stand and control descent sitting; stand step turn bed to recliner with RW use (manual assist for L knee stabilization and cueing to prevent hyperextension)     Ambulation / Gait / Stairs / Wheelchair Mobility   Ambulation/Gait Ambulation/Gait assistance: Mod assist Gait Distance (Feet): 10 Feet (+ 35 ft) Assistive device:  Rolling walker (2 wheels) Gait Pattern/deviations: Decreased step length - right, Decreased step length - left, Decreased dorsiflexion - left, Decreased stride length General Gait Details: PT wraps L ankle in ace wrap for dorsiflexion assistance (no active movement noted yet). Pt ambulates around bed with mod assist but pt reaching for bed rails for RUE support throughout entirity of gait. PT provides tactile cuing at L knee to prevent buckling. Provided pt with RW & pt ambulates into hallway & back. Ace wrap provides assistance for foot clearance but pt still with impaired hip flexion during swing phase; continue to note L knee genu recurvatum at times. LLE fatigues with gait, limiting gait distance. Gait velocity: decreased     Posture / Balance Dynamic Sitting Balance Sitting balance - Comments: pt able to sit EOB without LOB Balance Overall balance assessment: Needs assistance Sitting-balance support: No upper extremity supported, Feet supported Sitting balance-Leahy Scale: Good Sitting balance - Comments: pt able to sit EOB without LOB Standing balance support: No upper extremity supported, During functional activity Standing balance-Leahy Scale: Poor Standing balance comment: mild posterior L lean in standing using RW  requiring occasional cueing and assist for balance     Special needs/care consideration Fall in room at [facility] on 8/11    Previous Home Environment  Living Arrangements: Spouse/significant other  Lives With: Spouse Available Help at Discharge: Family, Available 24 hours/day Type of Home: House Home Layout: One level Home Access: Stairs to enter Entrance Stairs-Rails: None Secretary/administrator of Steps: 2 Bathroom Shower/Tub: Health visitor: Handicapped height Bathroom Accessibility: Yes How Accessible: Accessible via walker Home Care Services: No Additional Comments: 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   Discharge Living Setting Plans for Discharge Living Setting: Patient's home, Lives with (comment) (spouse) Type of Home at Discharge: House Discharge Home Layout: One level Discharge Home Access: Stairs to enter Entrance Stairs-Rails: None Entrance Stairs-Number of Steps: 2 Discharge Bathroom Shower/Tub: Walk-in shower Discharge Bathroom Toilet: Handicapped height Discharge Bathroom Accessibility: Yes How Accessible: Accessible via walker Does the patient have any problems obtaining your medications?: No   Social/Family/Support Systems Patient Roles: Spouse Contact Information: spouse, Alcide Clever Anticipated Caregiver: spouse and sisters Anticipated Caregiver's Contact Information: see contacts Ability/Limitations of Caregiver: none Caregiver Availability: 24/7 Discharge Plan Discussed with Primary Caregiver: Yes Is Caregiver In Agreement with Plan?: Yes Does Caregiver/Family have Issues with Lodging/Transportation while Pt is in Rehab?: No   Goals Patient/Family Goal for Rehab: supervision PT, supervision to MIn OT, no SLP Expected length of stay: ELOS 10 to 14 days Pt/Family Agrees to Admission and willing to participate: Yes Program Orientation Provided & Reviewed with Pt/Caregiver Including Roles  & Responsibilities: Yes   Decrease burden of  Care through IP rehab admission: n/a   Possible need for SNF placement upon discharge: not anticipated   Patient Condition: I have reviewed medical records from [facility], spoken with CM, and patient and spouse. I discussed via phone for inpatient rehabilitation assessment.  Patient will benefit from ongoing PT and OT, can actively participate in 3 hours of therapy a day 5 days of the week, and can make measurable gains during the admission.  Patient will also benefit from the coordinated team approach during an Inpatient Acute Rehabilitation admission.  The patient will receive intensive therapy as well as Rehabilitation physician, nursing, social worker, and care management interventions.  Due to bladder management, bowel management, safety, skin/wound care, disease management, medication administration, pain management, and patient education the patient requires 24 hour  a day rehabilitation nursing.  The patient is currently Min A  with mobility and basic ADLs.  Discharge setting and therapy post discharge at home with home health is anticipated.  Patient has agreed to participate in the Acute Inpatient Rehabilitation Program and will admit today.   Preadmission Screen Completed By: Ottie Glazier RN MSN with updates by Megan Salon, MS, CCC-SLP Clois Dupes, 09/01/2021 6:05 PM ______________________________________________________________________   Discussed status with Dr. Riley Kill  on 09/02/21 at 930 and received approval for admission today.   Admission Coordinator:Barbara Boyette RN MSN with updates by Megan Salon, MS, CCC-SLP  Beckie Salts Foye Spurling, RN, time 1130/Date 09/02/21    Assessment/Plan: Diagnosis: cva Does the need for close, 24 hr/day Medical supervision in concert with the patient's rehab needs make it unreasonable for this patient to be served in a less intensive setting? Yes Co-Morbidities requiring supervision/potential complications: htn, aaa s/p rupture Due to bladder  management, bowel management, safety, skin/wound care, disease management, medication administration, pain management, and patient education, does the patient require 24 hr/day rehab nursing? Yes Does the patient require coordinated care of a physician, rehab nurse, PT, OT,     to address physical and functional deficits in the context of the above medical diagnosis(es)? Yes Addressing deficits in the following areas: balance, endurance, locomotion, strength, transferring, bowel/bladder control, bathing, dressing, feeding, grooming, toileting, cognition,  and psychosocial support Can the patient actively participate in an intensive therapy program of at least 3 hrs of therapy 5 days a week? Yes The potential for patient to make measurable gains while on inpatient rehab is excellent Anticipated functional outcomes upon discharge from inpatient rehab: supervision PT, min assist OT, n/a SLP Estimated rehab length of stay to reach the above functional goals is: 10-14 days Anticipated discharge destination: Home 10. Overall Rehab/Functional Prognosis: excellent     MD Signature: Ranelle Oyster, MD, [facility] Physical Medicine & Rehabilitation Medical Director Rehabilitation Services 09/02/2021

## 2021-09-02 NOTE — Progress Notes (Addendum)
Patient ID: Susan Farrell, female   DOB: 1945-02-07, 76 y.o.   MRN: [id number] Met with the patient to review rehab process, team conference and plan of care. Discussed secondary risk factors including smoking, dyslipidemia and medications along with heart healthy diet including prediabetes tips. Continue to follow along to discharge to address educational needs to facilitate discharge home with spouse. Margarito Liner

## 2021-09-02 NOTE — Progress Notes (Signed)
Orthopedic Tech Progress Note Patient Details:  Susan Farrell 1945/06/06 [id number]  Ortho Devices Type of Ortho Device: Prafo boot/shoe Ortho Device/Splint Location: LLE Ortho Device/Splint Interventions: Ordered, Application   Post Interventions Patient Tolerated: Well  Susan Farrell 09/02/2021, 7:30 PM

## 2021-09-02 NOTE — Care Management Important Message (Signed)
Important Message  Patient Details  Name: Susan Farrell MRN: [id number] Date of Birth: [birth year]   Medicare Important Message Given:  Yes     Olegario Messier A Luismanuel Corman 09/02/2021, 12:38 PM

## 2021-09-02 NOTE — Discharge Summary (Signed)
Physician Discharge Summary  Susan HuntsmanOdessa J Farrell ZOX:[id number] DOB: 07/07/1945 DOA: 08/28/2021  PCP: Susan Farrell, Vishwanath, MD  Admit date: 08/28/2021 Discharge date: 09/02/2021  Admitted From: home  Disposition:  CIR  Recommendations for Outpatient Follow-up:  Follow up with PCP in 1-2 weeks F/u w/ neuro, Susan Farrell or a neurologist of your choice, in 2-3 weeks  Home Health: no  Equipment/Devices:  Discharge Condition: stable  CODE STATUS: full  Diet recommendation: Heart Healthy  Brief/Interim Summary: HPI was taken from Dr. Arville Farrell: Susan HuntsmanOdessa J Streety is a 76 y.o. African-American female with medical history significant for discoid lupus and hypertension, dyslipidemia, hypothyroidism, abdominal aortic aneurysm, s/p rupture and repair, and tobacco abuse, who presented to the emergency room with acute onset of left lower extremity weakness that started around 1 PM today with no paresthesias or slurred speech.  She denies any headache or dizziness or blurred vision.  No tinnitus or vertigo.  No urinary or stool incontinence.  No other focal deficits.  No dysuria, oliguria or hematuria or flank pain.  No nausea or vomiting or abdominal pain.  No chest pain or palpitations.  No cough or wheezing or orthopnea or paroxysmal nocturnal dyspnea.  ED Course: Upon presentation to the emergency room, BP was 184/96 with otherwise normal vital signs.  Labs are unremarkable CMP.  CBC showed hemoglobin 10.9 hematocrit 33.5 slightly lower than previous levels with platelets of 149 bilirubin previous levels.  Informed antigens and COVID-19 PCR [place] negative.  UA was unremarkable.  Urine drug screen came back negative.  EKG as reviewed by me : EKG showed sinus rhythm rate of 65 with probable left atrial enlargement and LVH. Imaging: Portable chest x-ray showed mild congestive heart failure and interstitial edema. Code stroke head CT scan revealed no acute intracranial normalities.  It showed chronic right basal ganglia  lacunar infarct. Head and neck CTA revealed the following: 1. The common carotid, internal carotid and vertebral arteries are patent within the neck. Mild atherosclerotic narrowing at the origin of the left vertebral artery. 2. Aortic Atherosclerosis (ICD10-I70.0). 3. 2.7 cm left thyroid lobe nodule. This nodule was previously evaluated by ultrasound on 02/08/2013, and fine-needle aspiration was recommended at that time. Please refer to this prior report for further details. 4. Cervical spondylosis. Most notably, a posterior disc osteophyte complex contributes to suspected severe spinal canal stenosis at C4-C5. Consider a cervical spine MRI to assess for spinal cord impingement at this level, when clinically appropriate.   CTA head:   1. No intracranial large vessel occlusion or proximal high-grade arterial stenosis is identified. 2. 1-2 mm ventrally projecting vascular protrusion arising from the cavernous right ICA, suspicious for an aneurysm.   The patient will be admitted to an observation medical telemetry bed for further evaluation and management.   As per Dr. Mayford Farrell 8/10-8/14/23: Pt presented w/ left lower extremity weakness and was found to have an ischemic nonhemorrhagic right basal ganglia infarct as per MRI brain. Pt was started on aspirin, plavix x 3 weeks and then aspirin monotherapy thereafter as per neuro. Of note, pt's home dose of crestor was increased to 40mg  daily. PT/OT evaluated pt and recommended CIR. [facility]. For more information, please see previous progress/consult notes.     Discharge Diagnoses:  Principal Problem:   CVA (cerebral vascular accident) [facility](HCC) Active Problems:   Hypertensive urgency   Dyslipidemia   Hypothyroidism  CVA: ischemic nonhemorrhagic right basal ganglia infarct as per MRI brain. Continue w/ neuro checks.  Continue on plavix, aspirin x 3 weeks and aspirin monotherapy thereafter as per neuro. PT/OT recs CIR.  Echo shows EF 60-65%, grade I diastolic dysfunction, no regional wall motion abnormalities & no evidence of any interatrial shunt  Fall: in pt's room 08/30/21. Pt got up w/o asking for help. Pt did not hit her head and vitals signs were stable. Pt given education on not getting up by herself. Continue w/ bed/chair alarm. No other falls since 08/30/21  Hypertensive urgency: urgency resolved but still w/ HTN. Continue on BB  Hypokalemia: WNL today    HLD: continue on statin    Hypothyroidism: continue on levothyroxine   Thrombocytopenia: resolved   Microcytic anemia: etiology unclear. Iron is WNL. H&H are labile   Hx of discoid lupus: not currently on any meds as per med rec  Discharge Instructions  Discharge Instructions     Diet - low sodium heart healthy   Complete by: As directed    Discharge instructions   Complete by: As directed    F/u w/ PCP in 1-2 weeks. F/u w/ neuro, Dr. Malvin Johns or a neurologist on your choice, in 2- 3 weeks   Increase activity slowly   Complete by: As directed       Allergies as of 09/02/2021   No Known Allergies      Medication List     TAKE these medications    aspirin EC 81 MG tablet Take 1 tablet (81 mg total) by mouth daily. Swallow whole. Start taking on: September 03, 2021   Calcium Carbonate-Vitamin D 600-200 MG-UNIT Tabs Take 1 tablet by mouth daily.   clopidogrel 75 MG tablet Commonly known as: PLAVIX Take 1 tablet (75 mg total) by mouth daily for 17 days. Start taking on: September 03, 2021   docusate sodium 100 MG capsule Commonly known as: COLACE Take 100 mg by mouth daily.   metoprolol succinate 50 MG 24 hr tablet Commonly known as: TOPROL-XL Take 1 tablet by mouth daily.   Niferex Tabs Take 1 tablet by mouth daily.   rosuvastatin 40 MG tablet Commonly known as: CRESTOR Take 1 tablet (40 mg total) by mouth daily. Start taking on: September 03, 2021 What changed:  medication strength how much to take   Synthroid 100  MCG tablet Generic drug: levothyroxine Take 100 mcg by mouth daily.   WOMENS MULTIVITAMIN + COLLAGEN PO Take 1 tablet by mouth daily.        No Known Allergies  Consultations: Neuro    Procedures/Studies: ECHOCARDIOGRAM COMPLETE BUBBLE STUDY  Result Date: 08/29/2021    ECHOCARDIOGRAM REPORT   Patient Name:   Susan Farrell Date of Exam: 08/29/2021 Medical Rec #:  [id number]        Height:       66.0 in Accession #:    [id number]       Weight:       159.0 lb Date of Birth:  06-14-1945        BSA:          1.814 m Patient Age:    75 years         BP:           160/81 mmHg Patient Gender: F                HR:           58 bpm. Exam Location:  [facility] Procedure: 2D Echo, Color Doppler, Cardiac Doppler and Saline Contrast Bubble  Study Indications:     I63.9 Stroke  History:         Patient has no prior history of Echocardiogram examinations.                  Risk Factors:Hypertension, Dyslipidemia and Current Smoker.  Sonographer:     Humphrey Rolls Referring Phys:  [id number] JAN A MANSY Diagnosing Phys: Lorine Bears MD IMPRESSIONS  1. Left ventricular ejection fraction, by estimation, is 60 to 65%. The left ventricle has normal function. The left ventricle has no regional wall motion abnormalities. There is moderate left ventricular hypertrophy. Left ventricular diastolic parameters are consistent with Grade I diastolic dysfunction (impaired relaxation).  2. Right ventricular systolic function is normal. The right ventricular size is normal. There is normal pulmonary artery systolic pressure.  3. Left atrial size was mildly dilated.  4. The mitral valve is normal in structure. Trivial mitral valve regurgitation. No evidence of mitral stenosis.  5. The aortic valve is normal in structure. Aortic valve regurgitation is not visualized. No aortic stenosis is present.  6. The inferior vena cava is normal in size with greater than 50% respiratory variability, suggesting right atrial pressure of 3 mmHg.   7. Agitated saline contrast bubble study was negative, with no evidence of any interatrial shunt. FINDINGS  Left Ventricle: Left ventricular ejection fraction, by estimation, is 60 to 65%. The left ventricle has normal function. The left ventricle has no regional wall motion abnormalities. The left ventricular internal cavity size was normal in size. There is  moderate left ventricular hypertrophy. Left ventricular diastolic parameters are consistent with Grade I diastolic dysfunction (impaired relaxation). Right Ventricle: The right ventricular size is normal. No increase in right ventricular wall thickness. Right ventricular systolic function is normal. There is normal pulmonary artery systolic pressure. The tricuspid regurgitant velocity is 2.54 m/s, and  with an assumed right atrial pressure of 3 mmHg, the estimated right ventricular systolic pressure is 28.8 mmHg. Left Atrium: Left atrial size was mildly dilated. Right Atrium: Right atrial size was normal in size. Pericardium: There is no evidence of pericardial effusion. Mitral Valve: The mitral valve is normal in structure. Trivial mitral valve regurgitation. No evidence of mitral valve stenosis. Tricuspid Valve: The tricuspid valve is normal in structure. Tricuspid valve regurgitation is trivial. No evidence of tricuspid stenosis. Aortic Valve: The aortic valve is normal in structure. Aortic valve regurgitation is not visualized. No aortic stenosis is present. Aortic valve mean gradient measures 5.0 mmHg. Aortic valve peak gradient measures 9.4 mmHg. Aortic valve area, by VTI measures 3.14 cm. Pulmonic Valve: The pulmonic valve was normal in structure. Pulmonic valve regurgitation is not visualized. No evidence of pulmonic stenosis. Aorta: The aortic root is normal in size and structure. Venous: The inferior vena cava is normal in size with greater than 50% respiratory variability, suggesting right atrial pressure of 3 mmHg. IAS/Shunts: No atrial level  shunt detected by color flow Doppler. Agitated saline contrast was given intravenously to evaluate for intracardiac shunting. Agitated saline contrast bubble study was negative, with no evidence of any interatrial shunt.  LEFT VENTRICLE PLAX 2D LVIDd:         4.04 cm   Diastology LVIDs:         2.89 cm   LV e' medial:    4.57 cm/s LV PW:         1.39 cm   LV E/e' medial:  15.4 LV IVS:        0.91 cm  LV e' lateral:   5.87 cm/s LVOT diam:     2.30 cm   LV E/e' lateral: 12.0 LV SV:         95 LV SV Index:   52 LVOT Area:     4.15 cm  RIGHT VENTRICLE RV Basal diam:  3.80 cm LEFT ATRIUM             Index        RIGHT ATRIUM           Index LA diam:        3.30 cm 1.82 cm/m   RA Area:     12.60 cm LA Vol (A2C):   48.3 ml 26.63 ml/m  RA Volume:   28.60 ml  15.77 ml/m LA Vol (A4C):   49.7 ml 27.40 ml/m LA Biplane Vol: 52.8 ml 29.11 ml/m  AORTIC VALVE                     PULMONIC VALVE AV Area (Vmax):    2.82 cm      PV Vmax:       0.90 m/s AV Area (Vmean):   2.84 cm      PV Peak grad:  3.2 mmHg AV Area (VTI):     3.14 cm AV Vmax:           153.00 cm/s AV Vmean:          103.000 cm/s AV VTI:            0.302 m AV Peak Grad:      9.4 mmHg AV Mean Grad:      5.0 mmHg LVOT Vmax:         104.00 cm/s LVOT Vmean:        70.500 cm/s LVOT VTI:          0.228 m LVOT/AV VTI ratio: 0.75  AORTA Ao Root diam: 3.40 cm MITRAL VALVE               TRICUSPID VALVE MV Area (PHT): 2.15 cm    TR Peak grad:   25.8 mmHg MV Decel Time: 353 msec    TR Vmax:        254.00 cm/s MV E velocity: 70.30 cm/s MV A velocity: 98.10 cm/s  SHUNTS MV E/A ratio:  0.72        Systemic VTI:  0.23 m                            Systemic Diam: 2.30 cm Lorine Bears MD Electronically signed by Lorine Bears MD Signature Date/Time: 08/29/2021/3:07:55 PM    Final    MR BRAIN WO CONTRAST  Result Date: 08/29/2021 CLINICAL DATA:  Initial evaluation for acute neuro deficit, stroke suspected. EXAM: MRI HEAD WITHOUT CONTRAST TECHNIQUE: Multiplanar, multiecho  pulse sequences of the brain and surrounding structures were obtained without intravenous contrast. COMPARISON:  Prior CT from 08/28/2021. FINDINGS: Brain: Mild age-related cerebral atrophy with chronic small vessel ischemic disease. Remote lacunar infarct involving the anterior right basal ganglia with associated chronic hemosiderin staining noted. 1 cm acute ischemic nonhemorrhagic infarct present at the posterior right basal ganglia (series 5, image 84). No significant regional mass effect. No other evidence for acute or subacute ischemia. Gray-white matter differentiation otherwise maintained. No other acute or chronic intracranial blood products. No mass lesion, midline shift or mass effect. No hydrocephalus or extra-axial fluid collection. Pituitary gland and suprasellar region  within normal limits. Vascular: Major intracranial vascular flow voids are maintained. Skull and upper cervical spine: Craniocervical junction within normal limits. Bone marrow signal intensity normal. No scalp soft tissue abnormality. Sinuses/Orbits: Globes orbital soft tissues within normal limits. Paranasal sinuses are largely clear. No significant mastoid effusion. Other: None. IMPRESSION: 1. 1 cm acute ischemic nonhemorrhagic right basal ganglia infarct. 2. Underlying age-related cerebral atrophy with mild chronic small vessel ischemic disease, with additional chronic right basal ganglia lacunar infarct. Electronically Signed   By: Rise Mu M.D.   On: 08/29/2021 03:24   CT ANGIO HEAD NECK W WO CM (CODE STROKE)  Result Date: 08/28/2021 CLINICAL DATA:  Stroke, follow-up. Additional history provided: Patient unable to lift/move left leg. Symptoms began at 1300 today. Intermittent weakness in left arm. EXAM: CT ANGIOGRAPHY HEAD AND NECK TECHNIQUE: Multidetector CT imaging of the head and neck was performed using the standard protocol during bolus administration of intravenous contrast. Multiplanar CT image  reconstructions and MIPs were obtained to evaluate the vascular anatomy. Carotid stenosis measurements (when applicable) are obtained utilizing NASCET criteria, using the distal internal carotid diameter as the denominator. RADIATION DOSE REDUCTION: This exam was performed according to the departmental dose-optimization program which includes automated exposure control, adjustment of the mA and/or kV according to patient size and/or use of iterative reconstruction technique. CONTRAST:  75mL OMNIPAQUE IOHEXOL 350 MG/ML SOLN COMPARISON:  Noncontrast head CT performed earlier today 08/28/2021. FINDINGS: CTA NECK FINDINGS Aortic arch: Common origin of the innominate and left common carotid arteries. Atherosclerotic plaque within the aortic arch. No hemodynamically significant innominate or proximal subclavian artery stenosis. Right carotid system: CCA and ICA patent within the neck without stenosis or significant atherosclerotic disease. Left carotid system: CCA and ICA patent within the neck without stenosis or significant atherosclerotic disease. Vertebral arteries: Vertebral arteries patent within the neck. Mild atherosclerotic narrowing at the origin of the left vertebral artery. Skeleton: Nonspecific reversal of the expected cervical lordosis. Cervical spondylosis. Most notably at C4-C5, a posterior disc osteophyte complex contributes to suspected severe spinal canal stenosis (series 6, image 157). Other neck: 2.7 cm nodule within the left thyroid lobe. Upper chest: No consolidation within the imaged lung apices. Review of the MIP images confirms the above findings CTA HEAD FINDINGS Anterior circulation: The intracranial internal carotid arteries are patent. Nonstenotic atherosclerotic plaque within both vessels. The M1 middle cerebral arteries are patent. Atherosclerotic irregularity of the M2 and more distal MCA vessels, bilaterally. No M2 proximal branch occlusion or high-grade proximal stenosis is identified.  The anterior cerebral arteries are patent. 1-2 mm ventrally projecting vascular protrusion arising from the cavernous right ICA, suspicious for an aneurysm (series 8, image 97) (series 6, image 260). Posterior circulation: The intracranial vertebral arteries are patent. The basilar artery is patent. The posterior cerebral arteries are patent. Posterior communicating arteries are diminutive or absent, bilaterally. Venous sinuses: Within the limitations of contrast timing, no convincing thrombus. Anatomic variants: As described. Review of the MIP images confirms the above findings No emergent large vessel occlusion identified. These results were called by telephone at the time of interpretation on 08/28/2021 at 10:11 pm to provider Willy Eddy , who verbally acknowledged these results. IMPRESSION: CTA neck: 1. The common carotid, internal carotid and vertebral arteries are patent within the neck. Mild atherosclerotic narrowing at the origin of the left vertebral artery. 2. Aortic Atherosclerosis (ICD10-I70.0). 3. 2.7 cm left thyroid lobe nodule. This nodule was previously evaluated by ultrasound on 02/08/2013, and fine-needle aspiration was recommended at  that time. Please refer to this prior report for further details. 4. Cervical spondylosis. Most notably, a posterior disc osteophyte complex contributes to suspected severe spinal canal stenosis at C4-C5. Consider a cervical spine MRI to assess for spinal cord impingement at this level, when clinically appropriate. CTA head: 1. No intracranial large vessel occlusion or proximal high-grade arterial stenosis is identified. 2. 1-2 mm ventrally projecting vascular protrusion arising from the cavernous right ICA, suspicious for an aneurysm. Electronically Signed   By: Jackey Loge D.O.   On: 08/28/2021 22:14   DG Chest Portable 1 View  Result Date: 08/28/2021 CLINICAL DATA:  Weakness, tobacco abuse EXAM: PORTABLE CHEST 1 VIEW COMPARISON:  None Available. FINDINGS:  Single frontal view of the chest demonstrates mild enlargement of the cardiac silhouette. There is increased central vascular congestion. Subpleural interstitial prominence at the lung bases may reflect Kerley lines related to interstitial edema. No effusion or pneumothorax. No acute airspace disease. IMPRESSION: 1. Findings consistent with mild congestive heart failure and interstitial edema. Electronically Signed   By: Sharlet Salina M.D.   On: 08/28/2021 20:52   CT HEAD CODE STROKE WO CONTRAST  Result Date: 08/28/2021 CLINICAL DATA:  Code stroke. Neuro deficit, acute, stroke suspected. Left-sided weakness. Last known well 1 p.m. today. EXAM: CT HEAD WITHOUT CONTRAST TECHNIQUE: Contiguous axial images were obtained from the base of the skull through the vertex without intravenous contrast. RADIATION DOSE REDUCTION: This exam was performed according to the departmental dose-optimization program which includes automated exposure control, adjustment of the mA and/or kV according to patient size and/or use of iterative reconstruction technique. COMPARISON:  None Available. FINDINGS: Brain: Mild generalized cerebral atrophy. Chronic lacunar infarct within the right basal ganglia (for instance as seen on series 3, image 11). Additional small hypodense focus more inferiorly within the right basal ganglia, favored to reflect a prominent perivascular space (for instance as seen on series 5, image 29). Partially empty sella turcica. There is no acute intracranial hemorrhage. No demarcated cortical infarct. No extra-axial fluid collection. No evidence of an intracranial mass. No midline shift. Vascular: No hyperdense vessel. Atherosclerotic calcifications. Skull: No fracture or aggressive osseous lesion. Sinuses/Orbits: No mass or acute finding within the imaged orbits. No significant paranasal sinus disease at the imaged levels. ASPECTS ([place]berta Stroke Program Early CT Score) - Ganglionic level infarction (caudate,  lentiform nuclei, internal capsule, insula, M1-M3 cortex): 7 - Supraganglionic infarction (M4-M6 cortex): 3 Total score (0-10 with 10 being normal): 10 (when discounting the chronic right basal ganglia lacunar infarct). No evidence of acute intracranial abnormality. These results were called by telephone at the time of interpretation on 08/28/2021 at 8:16 pm to provider Dr. Roxan Hockey, Who verbally acknowledged these results. IMPRESSION: No evidence of acute intracranial abnormality. ASPECTS is 10. Chronic right basal ganglia lacunar infarct. Mild generalized cerebral atrophy. Electronically Signed   By: Jackey Loge D.O.   On: 08/28/2021 20:17   (Echo, Carotid, EGD, Colonoscopy, ERCP)    Subjective: Pt c/o left leg weakness   Discharge Exam: Vitals:   09/02/21 0539 09/02/21 0953  BP: (!) 143/85 (!) 149/70  Pulse: (!) 59 (!) 57  Resp: 16   Temp: (!) 97.3 F (36.3 C) 98.3 F (36.8 C)  SpO2: 100% 100%   Vitals:   09/01/21 1615 09/01/21 2242 09/02/21 0539 09/02/21 0953  BP: (!) 148/79 (!) 143/75 (!) 143/85 (!) 149/70  Pulse: (!) 53 (!) 54 (!) 59 (!) 57  Resp: 18 18 16    Temp: 97.7 F (36.5  C) 97.8 F (36.6 C) (!) 97.3 F (36.3 C) 98.3 F (36.8 C)  TempSrc: Oral Oral Oral   SpO2: 100% 99% 100% 100%  Weight:      Height:        General: Pt is alert, awake, not in acute distress Cardiovascular: S1/S2 +, no rubs, no gallops Respiratory: CTA bilaterally, no wheezing, no rhonchi Abdominal: Soft, NT, ND, bowel sounds + Extremities: no cyanosis    The results of significant diagnostics from this hospitalization (including imaging, microbiology, ancillary and laboratory) are listed below for reference.     Microbiology: Recent Results (from the past 240 hour(s))  Resp Panel by RT-PCR (Flu A&B, Covid) Anterior Nasal Swab     Status: None   Collection Time: 08/28/21  8:18 PM   Specimen: Anterior Nasal Swab  Result Value Ref Range Status   SARS Coronavirus 2 by RT PCR NEGATIVE  NEGATIVE Final    Comment: (NOTE) SARS-CoV-2 target nucleic acids are NOT DETECTED.  The SARS-CoV-2 RNA is generally detectable in upper respiratory specimens during the acute phase of infection. The lowest concentration of SARS-CoV-2 viral copies this assay can detect is 138 copies/mL. A negative result does not preclude SARS-Cov-2 infection and should not be used as the sole basis for treatment or other patient management decisions. A negative result may occur with  improper specimen collection/handling, submission of specimen other than nasopharyngeal swab, presence of viral mutation(s) within the areas targeted by this assay, and inadequate number of viral copies(<138 copies/mL). A negative result must be combined with clinical observations, patient history, and epidemiological information. The expected result is Negative.  Fact Sheet for Patients:  BloggerCourse.com  Fact Sheet for Healthcare Providers:  SeriousBroker.it  This test is no t yet approved or cleared by the [place] FDA and  has been authorized for detection and/or diagnosis of SARS-CoV-2 by FDA under an Emergency Use Authorization (EUA). This EUA will remain  in effect (meaning this test can be used) for the duration of the COVID-19 declaration under Section 564(b)(1) of the Act, 21 U.S.C.section 360bbb-3(b)(1), unless the authorization is terminated  or revoked sooner.       Influenza A by PCR NEGATIVE NEGATIVE Final   Influenza B by PCR NEGATIVE NEGATIVE Final    Comment: (NOTE) The Xpert Xpress SARS-CoV-2/FLU/RSV plus assay is intended as an aid in the diagnosis of influenza from Nasopharyngeal swab specimens and should not be used as a sole basis for treatment. Nasal washings and aspirates are unacceptable for Xpert Xpress SARS-CoV-2/FLU/RSV testing.  Fact Sheet for Patients: BloggerCourse.com  Fact Sheet for Healthcare  Providers: SeriousBroker.it  This test is not yet approved or cleared by the [place] FDA and has been authorized for detection and/or diagnosis of SARS-CoV-2 by FDA under an Emergency Use Authorization (EUA). This EUA will remain in effect (meaning this test can be used) for the duration of the COVID-19 declaration under Section 564(b)(1) of the Act, 21 U.S.C. section 360bbb-3(b)(1), unless the authorization is terminated or revoked.  Performed at [facility], [address] [place], [place] [address]      Labs: BNP (last 3 results) No results for input(s): "BNP" in the last 8760 hours. Basic Metabolic Panel: Recent Labs  Lab 08/29/21 0427 08/30/21 0354 08/31/21 0405 09/01/21 0406 09/02/21 0417  NA 143 140 140 142 141  K 3.5 3.3* 3.8 3.8 4.0  CL 111 111 111 112* 112*  CO2 GLUCOSE 93 88 95 96  97  BUN 8 8 13 11 12   CREATININE 0.78 0.77 0.81 0.72 0.79  CALCIUM 8.6* 8.8* 9.0 8.8* 8.8*   Liver Function Tests: Recent Labs  Lab 08/28/21 2018  AST 21  ALT 10  ALKPHOS 64  BILITOT 0.6  PROT 7.4  ALBUMIN 3.9   No results for input(s): "LIPASE", "AMYLASE" in the last 168 hours. No results for input(s): "AMMONIA" in the last 168 hours. CBC: Recent Labs  Lab 08/28/21 2018 08/29/21 0427 08/30/21 0354 08/31/21 0405 09/01/21 0406 09/02/21 0417  WBC 4.2 4.7 3.6* 4.1 4.0 4.3  NEUTROABS 2.0  --   --   --   --   --   HGB 10.9* 10.6* 10.8* 10.7* 11.1* 10.9*  HCT 33.5* 32.5* 32.6* 32.6* 33.1* 32.7*  MCV 79.0* 78.1* 78.0* 77.8* 77.9* 78.2*  PLT 149* 126* 140* 162 154 176   Cardiac Enzymes: No results for input(s): "CKTOTAL", "CKMB", "CKMBINDEX", "TROPONINI" in the last 168 hours. BNP: Invalid input(s): "POCBNP" CBG: Recent Labs  Lab 08/28/21 1953  GLUCAP 124*   D-Dimer No results for input(s): "DDIMER" in the last 72 hours. Hgb A1c No results for input(s): "HGBA1C" in the last 72 hours. Lipid Profile No  results for input(s): "CHOL", "HDL", "LDLCALC", "TRIG", "CHOLHDL", "LDLDIRECT" in the last 72 hours. Thyroid function studies No results for input(s): "TSH", "T4TOTAL", "T3FREE", "THYROIDAB" in the last 72 hours.  Invalid input(s): "FREET3" Anemia work up Recent Labs    08/31/21 0405  FERRITIN 104  TIBC 287  IRON 55   Urinalysis    Component Value Date/Time   COLORURINE YELLOW (A) 08/28/2021 2119   APPEARANCEUR CLEAR (A) 08/28/2021 2119   APPEARANCEUR Hazy 02/10/2012 0812   LABSPEC 1.012 08/28/2021 2119   LABSPEC 1.015 02/10/2012 0812   PHURINE 7.0 08/28/2021 2119   GLUCOSEU NEGATIVE 08/28/2021 2119   GLUCOSEU Negative 02/10/2012 0812   HGBUR NEGATIVE 08/28/2021 2119   BILIRUBINUR NEGATIVE 08/28/2021 2119   BILIRUBINUR Negative 02/10/2012 0812   KETONESUR NEGATIVE 08/28/2021 2119   PROTEINUR NEGATIVE 08/28/2021 2119   NITRITE NEGATIVE 08/28/2021 2119   LEUKOCYTESUR TRACE (A) 08/28/2021 2119   LEUKOCYTESUR Negative 02/10/2012 0812   Sepsis Labs Recent Labs  Lab 08/30/21 0354 08/31/21 0405 09/01/21 0406 09/02/21 0417  WBC 3.6* 4.1 4.0 4.3   Microbiology Recent Results (from the past 240 hour(s))  Resp Panel by RT-PCR (Flu A&B, Covid) Anterior Nasal Swab     Status: None   Collection Time: 08/28/21  8:18 PM   Specimen: Anterior Nasal Swab  Result Value Ref Range Status   SARS Coronavirus 2 by RT PCR NEGATIVE NEGATIVE Final    Comment: (NOTE) SARS-CoV-2 target nucleic acids are NOT DETECTED.  The SARS-CoV-2 RNA is generally detectable in upper respiratory specimens during the acute phase of infection. The lowest concentration of SARS-CoV-2 viral copies this assay can detect is 138 copies/mL. A negative result does not preclude SARS-Cov-2 infection and should not be used as the sole basis for treatment or other patient management decisions. A negative result may occur with  improper specimen collection/handling, submission of specimen other than nasopharyngeal  swab, presence of viral mutation(s) within the areas targeted by this assay, and inadequate number of viral copies(<138 copies/mL). A negative result must be combined with clinical observations, patient history, and epidemiological information. The expected result is Negative.  Fact Sheet for Patients:  10/28/21  Fact Sheet for Healthcare Providers:  BloggerCourse.com  This test is no t yet approved or cleared by the [employer]ious[employer]  States FDA and  has been authorized for detection and/or diagnosis of SARS-CoV-2 by FDA under an Emergency Use Authorization (EUA). This EUA will remain  in effect (meaning this test can be used) for the duration of the COVID-19 declaration under Section 564(b)(1) of the Act, 21 U.S.C.section 360bbb-3(b)(1), unless the authorization is terminated  or revoked sooner.       Influenza A by PCR NEGATIVE NEGATIVE Final   Influenza B by PCR NEGATIVE NEGATIVE Final    Comment: (NOTE) The Xpert Xpress SARS-CoV-2/FLU/RSV plus assay is intended as an aid in the diagnosis of influenza from Nasopharyngeal swab specimens and should not be used as a sole basis for treatment. Nasal washings and aspirates are unacceptable for Xpert Xpress SARS-CoV-2/FLU/RSV testing.  Fact Sheet for Patients: BloggerCourse.com  Fact Sheet for Healthcare Providers: SeriousBroker.it  This test is not yet approved or cleared by the [place] FDA and has been authorized for detection and/or diagnosis of SARS-CoV-2 by FDA under an Emergency Use Authorization (EUA). This EUA will remain in effect (meaning this test can be used) for the duration of the COVID-19 declaration under Section 564(b)(1) of the Act, 21 U.S.C. section 360bbb-3(b)(1), unless the authorization is terminated or revoked.  Performed at [facility], [address] [place], [place] [address]       Time coordinating discharge: Over 30 minutes  SIGNED:   Charise Killian, MD  [facility] 09/02/2021, 11:39 AM Pager   If 7PM-7AM, please contact night-coverage [email or web address]

## 2021-09-03 DIAGNOSIS — I6381 Other cerebral infarction due to occlusion or stenosis of small artery: Secondary | ICD-10-CM | POA: Diagnosis not present

## 2021-09-03 LAB — COMPREHENSIVE METABOLIC PANEL
ALT: 16 U/L (ref 0–44)
AST: 25 U/L (ref 15–41)
Albumin: 3.3 g/dL — ABNORMAL LOW (ref 3.5–5.0)
Alkaline Phosphatase: 53 U/L (ref 38–126)
Anion gap: 7 (ref 5–15)
BUN: 13 mg/dL (ref 8–23)
CO2: 24 mmol/L (ref 22–32)
Calcium: 8.9 mg/dL (ref 8.9–10.3)
Chloride: 109 mmol/L (ref 98–111)
Creatinine, Ser: 0.73 mg/dL (ref 0.44–1.00)
GFR, Estimated: >60 mL/min (ref >60)
Glucose, Bld: 96 mg/dL (ref 70–99)
Potassium: 4.1 mmol/L (ref 3.5–5.1)
Sodium: 140 mmol/L (ref 135–145)
Total Bilirubin: 0.4 mg/dL (ref 0.3–1.2)
Total Protein: 6.5 g/dL (ref 6.5–8.1)

## 2021-09-03 LAB — CBC WITH DIFFERENTIAL/PLATELET
Abs Immature Granulocytes: 0.01 10*3/uL (ref 0.00–0.07)
Basophils Absolute: 0 10*3/uL (ref 0.0–0.1)
Basophils Relative: 0 %
Eosinophils Absolute: 0 10*3/uL (ref 0.0–0.5)
Eosinophils Relative: 1 %
HCT: 33.2 % — ABNORMAL LOW (ref 36.0–46.0)
Hemoglobin: 11 g/dL — ABNORMAL LOW (ref 12.0–15.0)
Immature Granulocytes: 0 %
Lymphocytes Relative: 33 %
Lymphs Abs: 1.5 10*3/uL (ref 0.7–4.0)
MCH: 26.1 pg (ref 26.0–34.0)
MCHC: 33.1 g/dL (ref 30.0–36.0)
MCV: 78.9 fL — ABNORMAL LOW (ref 80.0–100.0)
Monocytes Absolute: 0.4 10*3/uL (ref 0.1–1.0)
Monocytes Relative: 9 %
Neutro Abs: 2.6 10*3/uL (ref 1.7–7.7)
Neutrophils Relative %: 57 %
Platelets: 167 10*3/uL (ref 150–400)
RBC: 4.21 MIL/uL (ref 3.87–5.11)
RDW: 15.4 % (ref 11.5–15.5)
WBC: 4.5 10*3/uL (ref 4.0–10.5)
nRBC: 0 % (ref 0.0–0.2)

## 2021-09-03 NOTE — Progress Notes (Addendum)
Inpatient Rehabilitation  Patient information reviewed and entered into eRehab system by Derita Michelsen Dennis Killilea, OTR/L, Rehab Quality Coordinator.   Information including medical coding, functional ability and quality indicators will be reviewed and updated through discharge.   

## 2021-09-03 NOTE — Progress Notes (Signed)
Inpatient Rehabilitation Center Individual Statement of Services  Patient Name:  DALYNN JHAVERI  Date:  09/03/2021  Welcome to the Inpatient Rehabilitation Center.  Our goal is to provide you with an individualized program based on your diagnosis and situation, designed to meet your specific needs.  With this comprehensive rehabilitation program, you will be expected to participate in at least 3 hours of rehabilitation therapies Monday-Friday, with modified therapy programming on the weekends.  Your rehabilitation program will include the following services:  Physical Therapy (PT), Occupational Therapy (OT), Speech Therapy (ST), 24 hour per day rehabilitation nursing, Therapeutic Recreaction (TR), Neuropsychology, Care Coordinator, Rehabilitation Medicine, Nutrition Services, Pharmacy Services, and Other  Weekly team conferences will be held on Wednesdays to discuss your progress.  Your Inpatient Rehabilitation Care Coordinator will talk with you frequently to get your input and to update you on team discussions.  Team conferences with you and your family in attendance may also be held.  Expected length of stay: 10-14 Days  Overall anticipated outcome: Supervision to Min A  Depending on your progress and recovery, your program may change. Your Inpatient Rehabilitation Care Coordinator will coordinate services and will keep you informed of any changes. Your Inpatient Rehabilitation Care Coordinator's name and contact numbers are listed  below.  The following services may also be recommended but are not provided by the Inpatient Rehabilitation Center:   Home Health Rehabiltiation Services Outpatient Rehabilitation Services    Arrangements will be made to provide these services after discharge if needed.  Arrangements include referral to agencies that provide these services.  Your insurance has been verified to be:  [employer] Your primary doctor is:  Barbette Reichmann, MD  Pertinent  information will be shared with your doctor and your insurance company.  Inpatient Rehabilitation Care Coordinator:  Lavera Guise, [place] [phone] or [phone]  Information discussed with and copy given to patient by: Andria Rhein, 09/03/2021, 9:55 AM

## 2021-09-03 NOTE — Evaluation (Signed)
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 No pertinent past medical history. Past Surgical History:  Past Surgical History:  Procedure Laterality Date   ABDOMINAL HYSTERECTOMY     CHOLECYSTECTOMY     ENDOVASCULAR REPAIR/STENT GRAFT N/A 06/12/2020   Procedure: ENDOVASCULAR REPAIR/STENT GRAFT;  Surgeon: Katha Cabal, MD;  Location: [place] [facility];  Service: Cardiovascular;  Laterality: N/A;    Assessment & Plan Clinical Impression: Patient is a 76 y.o. year old female with recent admission to the hospital on with history of discoid lupus, hypertension, hyperlipidemia, hypothyroidism, abdominal aortic aneurysm status post rupture and repair, tobacco use.  Per chart review patient lives with spouse.  1 level home 2 steps to entry.  Independent prior to admission.  Presented to [facility] 08/28/2021 with acute onset of left-sided weakness.  Denied any headache or dizziness or blurred vision.  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  Underlying age-related cerebral atrophy with mild chronic small vessel ischemic disease with additional chronic right basal ganglia lacunar infarction.  CT angiogram head and neck showed internal carotid and vertebral arteries to be patent.  There was a two-point centimeter left thyroid lobe nodule previously evaluated by ultrasound 02/08/2013.  No intracranial large vessel occlusion or high-grade stenosis.  1-2 mm ventrally  projecting vascular protrusion arising from the cavernous right ICA suspicious for aneurysm.  Admission chemistries unremarkable except glucose 112, hemoglobin A1c 5.7, urine drug screen negative.  Echocardiogram with ejection fraction of 60 to 65% no wall motion abnormalities grade 1 diastolic dysfunction.  Currently maintained on low-dose aspirin 81 mg daily as well as Plavix for CVA prophylaxis x3 weeks followed by aspirin monotherapy.  Subcutaneous Lovenox for DVT prophylaxis.  Hospital course patient did sustain fall 08/30/2021 when attempting to get out of bed without assistance with no injury sustained.  Tolerating a regular consistency diet.  Therapy evaluations completed due to patient's left-sided weakness decreased functional mobility was admitted for a comprehensive rehab program.   Patient transferred to CIR on 09/02/2021 .    Patient currently requires mod with basic self-care skills secondary to muscle weakness, impaired timing and sequencing, abnormal tone, unbalanced muscle activation, ataxia, decreased coordination, and decreased motor planning, and decreased sitting balance, decreased standing balance, decreased postural control, hemiplegia, and decreased balance strategies.  Prior to hospitalization, patient could complete BADL with independent .  Patient will benefit from skilled intervention to increase independence with basic self-care skills prior to discharge home with care partner.  Anticipate patient will require intermittent supervision and follow up outpatient.  OT - End of Session Endurance Deficit: Yes Endurance Deficit Description: rest breaks within BADL tasks OT Assessment Rehab Potential (ACUTE ONLY): Excellent OT Patient demonstrates impairments in the following area(s): Balance;Endurance;Motor;Safety OT Basic ADL's Functional Problem(s): Grooming;Dressing;Bathing;Toileting OT Transfers Functional Problem(s): Toilet;Tub/Shower OT Additional Impairment(s): Fuctional  Use of Upper Extremity OT Plan OT Intensity: Minimum of 1-2 x/day, 45 to 90 minutes OT Frequency: 5 out of 7 days OT Duration/Estimated Length of Stay: 10-14 days OT Treatment/Interventions: Balance/vestibular training;Cognitive remediation/compensation;Discharge planning;Disease mangement/prevention;DME/adaptive  equipment instruction;Community reintegration;Neuromuscular re-education;Pain management;Functional electrical stimulation;Functional mobility training;Patient/family education;Psychosocial support;Self Care/advanced ADL retraining;Therapeutic Activities;Skin care/wound managment;Splinting/orthotics;Therapeutic Exercise;UE/LE Strength taining/ROM;UE/LE Coordination activities;Wheelchair propulsion/positioning OT Basic Self-Care Anticipated Outcome(s): mod I OT Toileting Anticipated Outcome(s): MOD I OT Bathroom Transfers Anticipated Outcome(s): SUPERVISION OT Recommendation Recommendations for Other Services: Therapeutic Recreation consult Therapeutic Recreation Interventions: Stress management Patient destination: Home Follow Up Recommendations: Outpatient OT Equipment Recommended: To be determined Equipment Details: Patient has a built in Civil engineer, contracting   OT Evaluation Precautions/Restrictions  Precautions Precautions: Fall Precaution Comments: L hemi Restrictions Weight Bearing Restrictions: No Pain Pain Assessment Pain Scale: 0-10 Pain Score: 0-No pain Home Living/Prior Functioning Home Living Family/patient expects to be discharged to:: Private residence Living Arrangements: Spouse/significant other Available Help at Discharge: Family, Available 24 hours/day (large family support) Type of Home: House Home Access: Stairs to enter Technical brewer of Steps: 2 Entrance Stairs-Rails: None Home Layout: One level Bathroom Shower/Tub: Multimedia programmer: Handicapped height Bathroom Accessibility: Yes Additional Comments: retired from lab at [employer], built in [place] With: [place] and [place]: Enjoys going to church and bible study, enjoys shopping and vacationing Prior Function Level of Independence: Independent with basic ADLs, Independent with homemaking with ambulation  Able to Take Stairs?: Yes Driving: Yes 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 Vision Baseline Vision/History: 0 No visual deficits Ability to See in Adequate Light: 0 Adequate Patient Visual Report: No change from baseline Vision Assessment?: No apparent visual deficits Perception  Perception: Within Functional Limits Praxis Praxis: Intact Cognition Cognition Overall Cognitive Status: Within Functional Limits for tasks assessed Arousal/Alertness: Awake/alert Memory: Appears intact Attention: Focused;Sustained;Selective Focused Attention: Appears intact Sustained Attention: Appears intact Selective Attention: Appears intact Awareness: Appears intact Problem Solving: Appears intact Reasoning: Appears intact Safety/Judgment: Appears intact Brief Interview for Mental Status (BIMS) Repetition of Three Words (First Attempt): 3 Temporal Orientation: Year: Correct Temporal Orientation: Month: Accurate within 5 days Temporal Orientation: Day: Correct Recall: "Sock": No, could not recall Recall: "Blue": Yes, no cue required Recall: "Bed": Yes, no cue required BIMS Summary Score: 13 Sensation Sensation Light Touch: Appears Intact Hot/Cold: Appears Intact Proprioception: Impaired by gross assessment Stereognosis: Not tested Coordination Gross Motor Movements are Fluid and Coordinated: No Fine Motor Movements are Fluid and Coordinated: No Coordination and Movement Description: Mild decreased coordination and timing with L hand Finger Nose Finger Test: mild dysetria 9 Hole Peg Test: R: 26, L 59 Motor  Motor Motor: Hemiplegia;Ataxia (Simultaneous filing. User  may not have seen previous data.) Motor - Skilled Clinical Observations: L hemiplegia (Simultaneous filing. User may not have seen previous data.)  Balance Balance Balance Assessed: Yes SStatic Sitting Balance Static Sitting - Balance Support: Feet supported Static Sitting - Level of Assistance: 5: Stand by assistance Dynamic Sitting Balance Dynamic Sitting - Balance Support: Feet supported Dynamic Sitting - Level of Assistance: 5: Stand by assistance Static Standing Balance Static Standing - Balance Support: During functional activity Static Standing - Level of Assistance: 4: Min assist Dynamic Standing Balance Dynamic Standing - Balance Support: During functional activity Dynamic Standing - Level of Assistance: 3: Mod assist Extremity/Trunk Assessment RUE Assessment RUE Assessment: Within Functional Limits LUE Assessment LUE Assessment: Exceptions to [facility] General Strength Comments: L UE grossly 4/5, deficits in coordination LUE Body System: Neuro Brunstrum levels for arm and hand: Arm;Hand Brunstrum level for arm: Stage V Relative Independence from Synergy Brunstrum level for hand: Stage VI Isolated joint movements  Care  Tool Care Tool Self Care Eating   Eating Assist Level: Set up assist    Oral Care    Oral Care Assist Level: Set up assist    Bathing   Body parts bathed by patient: Right arm;Left arm;Chest;Abdomen;Front perineal area;Right upper leg;Left upper leg;Face Body parts bathed by helper: Buttocks;Right lower leg;Left lower leg   Assist Level: Moderate Assistance - Patient 50 - 74%    Upper Body Dressing(including orthotics)   What is the patient wearing?: Pull over shirt   Assist Level: Minimal Assistance - Patient > 75%    Lower Body Dressing (excluding footwear)   What is the patient wearing?: Underwear/pull up;Pants Assist for lower body dressing: Moderate Assistance - Patient 50 - 74%    Putting on/Taking off footwear   What is the patient  wearing?: Socks;Shoes Assist for footwear: Maximal Assistance - Patient 25 - 49%       Care Tool Toileting Toileting activity   Assist for toileting: Moderate Assistance - Patient 50 - 74%     Care Tool Bed Mobility Roll left and right activity   Roll left and right assist level: Supervision/Verbal cueing    Sit to lying activity        Lying to sitting on side of bed activity   Lying to sitting on side of bed assist level: the ability to move from lying on the back to sitting on the side of the bed with no back support.: Contact Guard/Touching assist     Care Tool Transfers Sit to stand transfer   Sit to stand assist level: Minimal Assistance - Patient > 75%    Chair/bed transfer   Chair/bed transfer assist level: Moderate Assistance - Patient 50 - 74%     Toilet transfer   Assist Level: Moderate Assistance - Patient 50 - 74%     Care Tool Cognition  Expression of Ideas and Wants Expression of Ideas and Wants: 4. Without difficulty (complex and basic) - expresses complex messages without difficulty and with speech that is clear and easy to understand  Understanding Verbal and Non-Verbal Content Understanding Verbal and Non-Verbal Content: 4. Understands (complex and basic) - clear comprehension without cues or repetitions   Memory/Recall Ability Memory/Recall Ability : Current season;Location of own room;Staff names and faces;That he or she is in a hospital/hospital unit   Refer to Care Plan for [place] 1 OT Short Term Goal 1 (Week 1): Patient will complete transfer to toilet with Min A OT Short Term Goal 2 (Week 1): Patient will recall hemi dressing techniques with min cues OT Short Term Goal 3 (Week 1): Patient will  with CGA to pull pants up over hips.  Recommendations for other services: Therapeutic Recreation  Stress management   Skilled Therapeutic Intervention OT eval completed addressing rehab process, OT purpose, POC, ELOS, and  goals.  Pt completed stand-pivot  bathroom transfers with min A to stand and mod A for pivot.Overall Mod A for LB ADLs due to L hemiplegia. Pt left seated in wc with chair alarm on, call bell in reach, and needs met. See below for further details regarding BADL performance.   ADL ADL Eating: Set up Grooming: Setup Upper Body Bathing: Minimal assistance Lower Body Bathing: Moderate assistance Upper Body Dressing: Minimal assistance Lower Body Dressing: Moderate assistance Toileting: Moderate assistance Toilet Transfer: Moderate assistance Tub/Shower Equipment: Walk in shower;Shower seat with back Social research officer, government: Moderate assistance Mobility  Bed Mobility Bed Mobility: Supine  to Sit;Sit to Supine Supine to Sit: Supervision/Verbal cueing Sit to Supine: Supervision/Verbal cueing (Uses bilateral UE to assist L LE) Transfers Sit to Stand: Minimal Assistance - Patient > 75% Stand to Sit: Minimal Assistance - Patient > 75%   Discharge Criteria: Patient will be discharged from OT if patient refuses treatment 3 consecutive times without medical reason, if treatment goals not met, if there is a change in medical status, if patient makes no progress towards goals or if patient is discharged from hospital.  The above assessment, treatment plan, treatment alternatives and goals were discussed and mutually agreed upon: by patient  Valma Cava 09/03/2021, 3:26 PM

## 2021-09-03 NOTE — Progress Notes (Signed)
Physical Therapy Session Note  Patient Details  Name: Susan Farrell MRN: [id number] Date of Birth: [birth year]  Today's Date: 09/03/2021 PT Individual Time: 1636-1730 PT Individual Time Calculation (min): 54 min   Short Term Goals: Week 1:  PT Short Term Goal 1 (Week 1): Pt will perform bed mobility with mod-i. PT Short Term Goal 2 (Week 1): Pt will perform sit to stand transfers with CGA and LRAD. PT Short Term Goal 3 (Week 1): Pt will perform stand pivot transfers with min assist and LRAD consistantly. PT Short Term Goal 4 (Week 1): Pt will ambulate 100 ft with min A and LRAD.  Skilled Therapeutic Interventions/Progress Updates:  Pt received sitting EOB and agreeable to therapy session. Sit<>stands, no AD, with min assist for balance during session. L stand pivot EOB>w/c, no AD, with min/mod assist for balance and pt having difficulty stepping L LE laterally to complete transfer due to impaired hip/knee flexion activation resulting in L lateral lean. Transported to/from gym in w/c for time management and energy conservation.  Donned L LE [place] Walk-on to support ankle during gait training due to 0/5 ankle DF and PF and due to knee hyperextension during gait.  Gait training ~44ft using L UE support around therapist with mod assist for balance and pt demonstrating the following gait deviations with therapist providing the described cuing and facilitation for improvement:  - poor L LE foot clearance during swing due to lack of sufficient hip/knee flexion -- results in toe drag that causes excessive LE external rotation  - decreased L knee hyperextension noted during stance compared to when using ACE wrap; although, pt does tend to avoid full L knee extension in an attempt to avoid hyperextension  - L lateral hip instability during stance causing hip drop - tendency to "shuffle" R foot forward due to limited stability during L stance with decreased stance time, cuing and facilitation for  improvement  Donned L LE functional electrical stimulation (FES) using Empi device for NMES Large muscle group with trigger switch (pre-set parameters) on pt's hamstrings to facilitate improved foot clearance during swing - intensity 38.   Gait training additional ~67ft with L UE support around therapist and mod assist for balance with continued above gait deviations with possible mild improvement in swing phase foot clearance.   L LE NMR at stairs with B HR support via repeated L LE foot taps to/from floor and 2nd step while standing on 1st step with R LE - goal to target increased L hip/knee flexion activation with pt requiring max manual facilitation for L LE movements 2sets x8 reps.   Doffed FES with no adverse side effects and pt's skin intact and normal.   Transported back to room and pt agreeable to remain sitting in w/c  - left with needs in reach, seat belt alarm on, and meal tray set-up.   Therapy Documentation Precautions:  Precautions Precautions: Fall Precaution Comments: L hemi Restrictions Weight Bearing Restrictions: No   Pain: Denies pain during session.    Therapy/Group: Individual Therapy  Ginny Forth , PT, DPT, NCS, CSRS 09/03/2021, 6:17 PM

## 2021-09-03 NOTE — Progress Notes (Signed)
Inpatient Rehabilitation Care Coordinator Assessment and Plan Patient Details  Name: Susan Farrell MRN: [id number] Date of Birth: 10/24/1945  Today's Date: 09/03/2021  Hospital Problems: Principal Problem:   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 [facility])  Past Medical History: History reviewed. No pertinent past medical history. Past Surgical History:  Past Surgical History:  Procedure Laterality Date   ABDOMINAL HYSTERECTOMY     CHOLECYSTECTOMY     ENDOVASCULAR REPAIR/STENT GRAFT N/A 06/12/2020   Procedure: ENDOVASCULAR REPAIR/STENT GRAFT;  Surgeon: Katha Cabal, MD;  Location: [place] [facility];  Service: Cardiovascular;  Laterality: N/A;   Social History:  reports that she has been smoking cigarettes. She uses smokeless tobacco. She reports that she does not currently use alcohol. She reports that she does not use drugs.  Family / Support Systems Marital Status: Married Patient Roles: Spouse Spouse/Significant Other: Doctor, general practice: spouse and sisters Ability/Limitations of Caregiver: none Caregiver Availability: 24/7 Family Dynamics: support from spouse, sisters and other family  Social History Preferred language: English Religion: [place] - How often do you need to have someone help you when you read instructions, pamphlets, or other written material from your doctor or pharmacy?: Never Employment Status: Retired   Abuse/Neglect Abuse/Neglect Assessment Can Be Completed: Yes Physical Abuse: Denies Verbal Abuse: Denies Sexual Abuse: Denies Exploitation of patient/patient's resources: Denies Self-Neglect: Denies  Patient response to: Social Isolation - How often do you feel lonely or isolated from those around you?: Never  Emotional Status Recent Psychosocial Issues: coping Psychiatric History: n/a Substance Abuse History: tobacco use  Patient / Family Perceptions, Expectations & Goals Pt/Family  understanding of illness & functional limitations: yes Premorbid pt/family roles/activities: independent Anticipated changes in roles/activities/participation: anticipates discharging home with assistance from spouse and family Pt/family expectations/goals: supervision  Education officer, environmental Agencies: None Premorbid Home Care/DME Agencies: Other (Comment) (shower seat) Transportation available at discharge: family able to transport Is the patient able to respond to transportation needs?: Yes In the past 12 months, has lack of transportation kept you from medical appointments or from getting medications?: No In the past 12 months, has lack of transportation kept you from meetings, work, or from getting things needed for daily living?: No  Discharge Planning Living Arrangements: Spouse/significant other Support Systems: Spouse/significant other Type of Residence: Private residence (1 level, 2 steps) Administrator, sports: Multimedia programmer (specify) Personnel officer Medicare) Financial Screen Referred: No Living Expenses: Lives with family Money Management: Patient Does the patient have any problems obtaining your medications?: No Home Management: Independent Patient/Family Preliminary Plans: spouse able to assist if needed Care Coordinator Barriers to Discharge: Insurance for SNF coverage, Lack of/limited family support, Decreased caregiver support Care Coordinator Anticipated Follow Up Needs: HH/OP Expected length of stay: 10-14 Days  Clinical Impression SW met with patient, introduced self and explained role. Patient plans to discharge back home with assistance from spouse. Patient anticipating supervision ambulatory goals. No additional questions or concerns, sw will continue to follow up.  Dyanne Iha 09/03/2021, 12:00 PM

## 2021-09-03 NOTE — Plan of Care (Signed)
  Problem: RH Balance Goal: LTG: Patient will maintain dynamic sitting balance (OT) Description: LTG:  Patient will maintain dynamic sitting balance with assistance during activities of daily living (OT) Flowsheets (Taken 09/03/2021 1527) LTG: Pt will maintain dynamic sitting balance during ADLs with: Independent Goal: LTG Patient will maintain dynamic standing with ADLs (OT) Description: LTG:  Patient will maintain dynamic standing balance with assist during activities of daily living (OT)  Flowsheets (Taken 09/03/2021 1527) LTG: Pt will maintain dynamic standing balance during ADLs with: Independent with assistive device   Problem: Sit to Stand Goal: LTG:  Patient will perform sit to stand in prep for activites of daily living with assistance level (OT) Description: LTG:  Patient will perform sit to stand in prep for activites of daily living with assistance level (OT) Flowsheets (Taken 09/03/2021 1527) LTG: PT will perform sit to stand in prep for activites of daily living with assistance level: Independent with assistive device   Problem: RH Grooming Goal: LTG Patient will perform grooming w/assist,cues/equip (OT) Description: LTG: Patient will perform grooming with assist, with/without cues using equipment (OT) Flowsheets (Taken 09/03/2021 1527) LTG: Pt will perform grooming with assistance level of: Independent with assistive device    Problem: RH Bathing Goal: LTG Patient will bathe all body parts with assist levels (OT) Description: LTG: Patient will bathe all body parts with assist levels (OT) Flowsheets (Taken 09/03/2021 1527) LTG: Pt will perform bathing with assistance level/cueing: Supervision/Verbal cueing   Problem: RH Dressing Goal: LTG Patient will perform upper body dressing (OT) Description: LTG Patient will perform upper body dressing with assist, with/without cues (OT). Flowsheets (Taken 09/03/2021 1527) LTG: Pt will perform upper body dressing with assistance level of:  Independent with assistive device Goal: LTG Patient will perform lower body dressing w/assist (OT) Description: LTG: Patient will perform lower body dressing with assist, with/without cues in positioning using equipment (OT) Flowsheets (Taken 09/03/2021 1527) LTG: Pt will perform lower body dressing with assistance level of: Supervision/Verbal cueing   Problem: RH Toileting Goal: LTG Patient will perform toileting task (3/3 steps) with assistance level (OT) Description: LTG: Patient will perform toileting task (3/3 steps) with assistance level (OT)  Flowsheets (Taken 09/03/2021 1527) LTG: Pt will perform toileting task (3/3 steps) with assistance level: Supervision/Verbal cueing   Problem: RH Functional Use of Upper Extremity Goal: LTG Patient will use RT/LT upper extremity as a (OT) Description: LTG: Patient will use right/left upper extremity as a stabilizer/gross assist/diminished/nondominant/dominant level with assist, with/without cues during functional activity (OT) Flowsheets (Taken 09/03/2021 1527) LTG: Use of upper extremity in functional activities: LUE as nondominant level   Problem: RH Toilet Transfers Goal: LTG Patient will perform toilet transfers w/assist (OT) Description: LTG: Patient will perform toilet transfers with assist, with/without cues using equipment (OT) Flowsheets (Taken 09/03/2021 1527) LTG: Pt will perform toilet transfers with assistance level of: Independent with assistive device   Problem: RH Tub/Shower Transfers Goal: LTG Patient will perform tub/shower transfers w/assist (OT) Description: LTG: Patient will perform tub/shower transfers with assist, with/without cues using equipment (OT) Flowsheets (Taken 09/03/2021 1527) LTG: Pt will perform tub/shower stall transfers with assistance level of: Supervision/Verbal cueing

## 2021-09-03 NOTE — Plan of Care (Signed)
Problem: RH Balance Goal: LTG Patient will maintain dynamic sitting balance (PT) Description: LTG:  Patient will maintain dynamic sitting balance with assistance during mobility activities (PT) Flowsheets (Taken 09/03/2021 1758) LTG: Pt will maintain dynamic sitting balance during mobility activities with:: Independent with assistive device  Goal: LTG Patient will maintain dynamic standing balance (PT) Description: LTG:  Patient will maintain dynamic standing balance with assistance during mobility activities (PT) Flowsheets (Taken 09/03/2021 1758) LTG: Pt will maintain dynamic standing balance during mobility activities with:: Supervision/Verbal cueing   Problem: Sit to Stand Goal: LTG:  Patient will perform sit to stand with assistance level (PT) Description: LTG:  Patient will perform sit to stand with assistance level (PT) Flowsheets (Taken 09/03/2021 1758) LTG: PT will perform sit to stand in preparation for functional mobility with assistance level: Supervision/Verbal cueing   Problem: RH Bed Mobility Goal: LTG Patient will perform bed mobility with assist (PT) Description: LTG: Patient will perform bed mobility with assistance, with/without cues (PT). Flowsheets (Taken 09/03/2021 1758) LTG: Pt will perform bed mobility with assistance level of: Independent with assistive device    Problem: RH Bed to Chair Transfers Goal: LTG Patient will perform bed/chair transfers w/assist (PT) Description: LTG: Patient will perform bed to chair transfers with assistance (PT). Flowsheets (Taken 09/03/2021 1758) LTG: Pt will perform Bed to Chair Transfers with assistance level: Supervision/Verbal cueing   Problem: RH Car Transfers Goal: LTG Patient will perform car transfers with assist (PT) Description: LTG: Patient will perform car transfers with assistance (PT). Flowsheets (Taken 09/03/2021 1758) LTG: Pt will perform car transfers with assist:: Supervision/Verbal cueing   Problem: RH  Ambulation Goal: LTG Patient will ambulate in controlled environment (PT) Description: LTG: Patient will ambulate in a controlled environment, # of feet with assistance (PT). Flowsheets (Taken 09/03/2021 1758) LTG: Pt will ambulate in controlled environ  assist needed:: Supervision/Verbal cueing LTG: Ambulation distance in controlled environment: 155ft using LRAD Goal: LTG Patient will ambulate in home environment (PT) Description: LTG: Patient will ambulate in home environment, # of feet with assistance (PT). Flowsheets (Taken 09/03/2021 1758) LTG: Pt will ambulate in home environ  assist needed:: Supervision/Verbal cueing LTG: Ambulation distance in home environment: 100 ft using LRAD   Problem: RH Stairs Goal: LTG Patient will ambulate up and down stairs w/assist (PT) Description: LTG: Patient will ambulate up and down # of stairs with assistance (PT) Flowsheets (Taken 09/03/2021 1758) LTG: Pt will ambulate up/down stairs assist needed:: Supervision/Verbal cueing LTG: Pt will  ambulate up and down number of stairs: 1 step using LRAD

## 2021-09-03 NOTE — Evaluation (Signed)
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 No pertinent past medical history. Past Surgical History:  Past Surgical History:  Procedure Laterality Date   ABDOMINAL HYSTERECTOMY     CHOLECYSTECTOMY     ENDOVASCULAR REPAIR/STENT GRAFT N/A 06/12/2020   Procedure: ENDOVASCULAR REPAIR/STENT GRAFT;  Surgeon: Katha Cabal, MD;  Location: [facility];  Service: Cardiovascular;  Laterality: N/A;    Assessment & Plan Clinical Impression: Patient is a 76 y.o. year old female  right-handed female with history of discoid lupus, hypertension, hyperlipidemia, hypothyroidism, abdominal aortic aneurysm status post rupture and repair, tobacco use.  Per chart review patient lives with spouse.  1 level home 2 steps to entry.  Independent prior to admission.  Presented to [facility]2021 with acute onset of left-sided weakness.  Denied any headache or dizziness or blurred vision.  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  Underlying age-related cerebral atrophy with mild chronic small vessel ischemic disease with additional chronic right basal ganglia lacunar infarction.  CT angiogram head and neck showed internal carotid and vertebral arteries to be patent.  There was a two-point centimeter left thyroid lobe nodule previously evaluated by ultrasound 02/08/2013.  No intracranial large vessel occlusion or high-grade stenosis.  1-2 mm ventrally projecting vascular protrusion arising from  the cavernous right ICA suspicious for aneurysm.  Admission chemistries unremarkable except glucose 112, hemoglobin A1c 5.7, urine drug screen negative.  Echocardiogram with ejection fraction of 60 to 65% no wall motion abnormalities grade 1 diastolic dysfunction.  Currently maintained on low-dose aspirin 81 mg daily as well as Plavix for CVA prophylaxis x3 weeks followed by aspirin monotherapy.  Subcutaneous Lovenox for DVT prophylaxis.  Hospital course patient did sustain fall 08/30/2021 when attempting to get out of bed without assistance with no injury sustained.  Tolerating a regular consistency diet.  Therapy evaluations completed due to patient's left-sided weakness decreased functional mobility was admitted for a comprehensive rehab program. Patient transferred to CIR on 09/02/2021 .   Patient currently requires mod assist with mobility secondary to muscle weakness and muscle paralysis, decreased cardiorespiratoy endurance, impaired timing and sequencing and unbalanced muscle activation, and decreased sitting balance, decreased standing balance, decreased postural control, and decreased balance strategies.  Prior to hospitalization, patient was independent  with mobility and lived with Spouse in a House home.  Home access is 2Stairs to enter.  Patient will benefit from skilled PT intervention to maximize safe functional mobility, minimize fall risk, and decrease caregiver burden for planned discharge home with 24 hour supervision.  Anticipate patient will benefit from follow up OP at discharge.  PT - End of Session Activity Tolerance: Tolerates 30+ min activity with multiple rests Endurance Deficit: Yes Endurance Deficit Description: rest breaks during mobilty PT Assessment Rehab Potential (ACUTE/IP ONLY): Good PT Patient demonstrates impairments in the following area(s): Balance;Pain;Endurance;Motor;Skin Integrity;Safety;Sensory PT Transfers Functional Problem(s): Bed Mobility;Bed to  Chair;Car;Furniture;Floor PT Locomotion Functional Problem(s): Ambulation;Stairs PT Plan PT Intensity: Minimum of 1-2 x/day ,45 to 90 minutes PT Frequency: 5 out of 7 days PT Duration Estimated  Length of Stay: 2 weeks PT Treatment/Interventions: Ambulation/gait training;Balance/vestibular training;Discharge planning;DME/adaptive equipment instruction;Functional mobility training;Pain management;Splinting/orthotics;Therapeutic Activities;Psychosocial support;UE/LE Strength taining/ROM;UE/LE Coordination activities;Therapeutic Exercise;Stair training;Patient/family education;Neuromuscular re-education;Functional electrical stimulation;Disease management/prevention;Community reintegration;Skin care/wound management PT Transfers Anticipated Outcome(s): supervision use LRAD PT Locomotion Anticipated Outcome(s): supervision using LRAD PT Recommendation Recommendations for Other Services: Therapeutic Recreation consult Therapeutic Recreation Interventions: Stress management;Outing/community reintergration Follow Up Recommendations: Outpatient PT;24 hour supervision/assistance Patient destination: Home Equipment Recommended: To be determined   PT Evaluation Precautions/Restrictions Precautions Precautions: Fall Precaution Comments: L hemi Restrictions Weight Bearing Restrictions: No Pain Pain Assessment Pain Scale: 0-10 Pain Score: 0-No pain Pain Interference Pain Interference Pain Effect on Sleep: 0. Does not apply - I have not had any pain or hurting in the past 5 days Pain Interference with Therapy Activities: 1. Rarely or not at all Pain Interference with Day-to-Day Activities: 1. 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 Vision/Perception  Vision - History Ability to See in Adequate Light: 0 Adequate Perception Perception: Within Functional Limits Praxis Praxis: Intact  Cognition Overall Cognitive Status: Within Functional Limits for tasks assessed Arousal/Alertness: Awake/alert Orientation Level: Oriented X4 Year: 2023 Month: August Attention: Focused;Sustained;Selective Focused Attention: Appears intact Sustained Attention: Appears intact Selective Attention: Appears intact Memory: Appears intact Awareness: Appears intact Problem Solving: Appears intact Reasoning: Appears intact Safety/Judgment: Appears intact Sensation Sensation Light Touch: Appears Intact Hot/Cold: Appears Intact Proprioception: Impaired by gross assessment Stereognosis: Not tested Coordination Gross Motor Movements are Fluid and Coordinated: No Fine Motor Movements are Fluid and Coordinated: No Coordination and Movement Description: Mild decreased coordination and timing with L hand Finger Nose Finger Test: mild dysetria 9 Hole Peg Test: R: 26, L 59 Motor  Motor Motor: Hemiplegia Motor - Skilled Clinical Observations: L hemi (LE>UE)   Trunk/Postural Assessment  Cervical Assessment Cervical Assessment: Exceptions to WFL (slight fwd head) Thoracic Assessment Thoracic Assessment: Within Functional Limits Lumbar Assessment Lumbar Assessment: Within Functional Limits Postural Control Postural Control: Deficits on evaluation Righting Reactions: right reactions were delayed, poor balance  recovery strategies.  Balance Balance Balance Assessed: Yes Standardized Balance Assessment Standardized Balance Assessment: Berg Balance Test Berg Balance Test Sit to Stand: Able to stand  independently using hands Standing Unsupported: Able to stand 30 seconds unsupported Sitting with Back Unsupported but Feet Supported on Floor or Stool: Able to sit 2 minutes under supervision Stand to Sit: Controls descent by using hands Transfers: Able to transfer with verbal cueing and /or supervision Standing Unsupported with Eyes Closed: Able to stand 10 seconds with supervision Standing Ubsupported with Feet Together: Needs help to attain position and unable to hold for 15 seconds From Standing, Reach Forward with Outstretched Arm: Reaches forward but needs supervision From Standing Position, Pick up Object from Floor: Unable to try/needs assist to keep balance From Standing Position, Turn to Look Behind Over each Shoulder: Needs supervision when turning Turn 360 Degrees: Needs assistance while turning Standing Unsupported, Alternately Place Feet on Step/Stool: Needs assistance  to keep from falling or unable to try Standing Unsupported, One Foot in Front: Loses balance while stepping or standing Standing on One Leg: Unable to try or needs assist to prevent fall Total Score: 18 Static Sitting Balance Static Sitting - Balance Support: Feet supported Static Sitting - Level of Assistance: 5: Stand by assistance Dynamic Sitting Balance Dynamic Sitting - Balance Support: Feet supported Dynamic Sitting - Level of Assistance: 5: Stand by assistance Static Standing Balance Static Standing - Balance Support: During functional activity Static Standing - Level of Assistance: 4: Min assist Dynamic Standing Balance Dynamic Standing - Balance Support: During functional activity Dynamic Standing - Level of Assistance: 3: Mod assist Extremity Assessment  RLE Assessment RLE Assessment: Within Functional  Limits Active Range of Motion (AROM) Comments: WFL General Strength Comments: grossly 5/5 in sitting with hip flexion being 4+/5. LLE Assessment LLE Assessment: Exceptions to [facility] General Strength Comments: assessed in sitting LLE Strength Left Hip Flexion: 3/5 Left Knee Flexion: 2/5 Left Knee Extension: 4+/5 Left Ankle Dorsiflexion: 0/5 Left Ankle Plantar Flexion: 0/5  Care Tool Care Tool Bed Mobility Roll left and right activity   Roll left and right assist level: Supervision/Verbal cueing    Sit to lying activity   Sit to lying assist level: Supervision/Verbal cueing    Lying to sitting on side of bed activity   Lying to sitting on side of bed assist level: the ability to move from lying on the back to sitting on the side of the bed with no back support.: Supervision/Verbal cueing     Care Tool Transfers Sit to stand transfer   Sit to stand assist level: Minimal Assistance - Patient > 75%    Chair/bed transfer   Chair/bed transfer assist level: Moderate Assistance - Patient 50 - 74%     Product manager transfer assist level: Moderate Assistance - Patient 50 - 74%      Care Tool Locomotion Ambulation   Assist level: 2 helpers Assistive device: Hand held assist Max distance: 29f  Walk 10 feet activity   Assist level: 2 helpers Assistive device: Hand held assist   Walk 50 feet with 2 turns activity Walk 50 feet with 2 turns activity did not occur: Safety/medical concerns      Walk 150 feet activity Walk 150 feet activity did not occur: Safety/medical concerns      Walk 10 feet on uneven surfaces activity Walk 10 feet on uneven surfaces activity did not occur: Safety/medical concerns      Stairs   Assist level: Moderate Assistance - Patient - 50 - 74% Stairs assistive device: 2 hand rails Max number of stairs: 4  Walk up/down 1 step activity   Walk up/down 1 step (curb) assist level: Moderate Assistance - Patient - 50 - 74% Walk  up/down 1 step or curb assistive device: 2 hand rails  Walk up/down 4 steps activity   Walk up/down 4 steps assist level: Moderate Assistance - Patient - 50 - 74% Walk up/down 4 steps assistive device: 2 hand rails  Walk up/down 12 steps activity Walk up/down 12 steps activity did not occur: Safety/medical concerns      Pick up small objects from floor   Pick up small object from the floor assist level: Moderate Assistance - Patient 50 - 74%    Wheelchair Is the patient using a wheelchair?: Yes (using for energy conservation and time management between therapy gym and room.) Type  of Wheelchair: Manual   Wheelchair assist level: Dependent - Patient 0%    Wheel 50 feet with 2 turns activity   Assist Level: Dependent - Patient 0%  Wheel 150 feet activity   Assist Level: Dependent - Patient 0%    Refer to Care Plan for Long Term Goals  SHORT TERM GOAL WEEK 1 PT Short Term Goal 1 (Week 1): Pt will perform bed mobility with mod-i. PT Short Term Goal 2 (Week 1): Pt will perform sit to stand transfers with CGA and LRAD. PT Short Term Goal 3 (Week 1): Pt will perform stand pivot transfers with min assist and LRAD consistantly. PT Short Term Goal 4 (Week 1): Pt will ambulate 100 ft with min A and LRAD.  Recommendations for other services: Therapeutic Recreation  Stress management and Outing/community reintegration  Skilled Therapeutic Intervention Pt received sitting in w/c and agreeable to therapy session. Evaluation completed (see details above) with patient education regarding purpose of PT evaluation, PT POC and goals, therapy schedule, weekly team meetings, and other CIR information including safety plan and fall risk safety. Pt performed the below functional mobility tasks with the specified levels of skilled cuing and assistance. Donned L LE ankle DF assist ACE wrap during gait training.  Pt noted to have L knee hyperextension during gait training and attempts at balance recovery during  Berg Balance Test. At end of session, pt left supine in bed with needs in reach and bed alarm on.  Mobility Bed Mobility Bed Mobility: Supine to Sit;Sit to Supine Supine to Sit: Supervision/Verbal cueing Sit to Supine: Supervision/Verbal cueing (Uses bilateral UE to assist L LE) Transfers Transfers: Sit to Stand;Stand to Sit;Stand Pivot Transfers Sit to Stand: Minimal Assistance - Patient > 75% Stand to Sit: Minimal Assistance - Patient > 75% Stand Pivot Transfers: Moderate Assistance - Patient 50 - 74%;Minimal Assistance - Patient > 75% Stand Pivot Transfer Details: Verbal cues for sequencing;Verbal cues for technique;Verbal cues for gait pattern;Manual facilitation for weight shifting;Manual facilitation for placement;Tactile cues for sequencing;Tactile cues for weight shifting Transfer (Assistive device): None Locomotion  Gait Ambulation: Yes Gait Assistance: 2 Helpers;Moderate Assistance - Patient 50-74% (+2 Mod Assist) Gait Distance (Feet): 30 Feet Assistive device: 2 person hand held assist;Other (Comment) (Dorsiflexion assist ACE wrap) Gait Assistance Details: Tactile cues for sequencing;Verbal cues for technique;Verbal cues for gait pattern;Manual facilitation for weight shifting;Manual facilitation for placement;Verbal cues for sequencing;Tactile cues for weight shifting Gait Gait: Yes Gait Pattern: Impaired Gait Pattern: Decreased hip/knee flexion - left;Decreased stride length;Decreased step length - left;Decreased step length - right;Decreased stance time - left;Poor foot clearance - left Gait velocity: decreased Stairs / Additional Locomotion Stairs: Yes Stairs Assistance: Moderate Assistance - Patient 50 - 74% Stair Management Technique: Two rails;Step to pattern;Forwards Number of Stairs: 4 Height of Stairs: 6 Wheelchair Mobility Wheelchair Mobility: No   Discharge Criteria: Patient will be discharged from PT if patient refuses treatment 3 consecutive times without  medical reason, if treatment goals not met, if there is a change in medical status, if patient makes no progress towards goals or if patient is discharged from hospital.  The above assessment, treatment plan, treatment alternatives and goals were discussed and mutually agreed upon: by patient  Tawana Scale , PT, DPT, NCS, CSRS 09/03/2021, 3:32 PM

## 2021-09-03 NOTE — Progress Notes (Signed)
PROGRESS NOTE   Subjective/Complaints:  No isues overnite except neck soreness from bed  ROS- denies CP, SOB, N/V/D  Objective:   No results found. Recent Labs    09/02/21 1642 09/03/21 0542  WBC 4.1 4.5  HGB 10.8* 11.0*  HCT 33.7* 33.2*  PLT 171 167   Recent Labs    09/02/21 0417 09/02/21 1539 09/03/21 0542  NA 141  --  140  K 4.0  --  4.1  CL 112*  --  109  CO2 25  --  24  GLUCOSE 97  --  96  BUN 12  --  13  CREATININE 0.79 0.84 0.73  CALCIUM 8.8*  --  8.9    Intake/Output Summary (Last 24 hours) at 09/03/2021 0727 Last data filed at 09/02/2021 1847 Gross per 24 hour  Intake 234 ml  Output --  Net 234 ml        Physical Exam: Vital Signs Blood pressure (!) 160/70, pulse (!) 54, temperature 98.3 F (36.8 C), temperature source Oral, resp. rate 17, height 5\' 6"  (1.676 m), weight 72.7 kg, SpO2 100 %.   General: No acute distress Mood and affect are appropriate Heart: Regular rate and rhythm no rubs murmurs or extra sounds Lungs: Clear to auscultation, breathing unlabored, no rales or wheezes Abdomen: Positive bowel sounds, soft nontender to palpation, nondistended Extremities: No clubbing, cyanosis, or edema Skin: No evidence of breakdown, no evidence of rash Neurologic: Cranial nerves II through XII intact, motor strength is 5/5 in RIght , 4/5 left  deltoid, bicep, tricep, grip, 5/5 R and 2/5 left hip flexor, knee extensors, ankle dorsiflexor and plantar flexor Sensory exam normal sensation to light touch  in bilateral upper and lower extremities Cerebellar exam normal finger to nose to finger as well as heel to shin in bilateral upper and lower extremities Finger to thumb opposition slower on left than right side  Musculoskeletal: Full range of motion in all 4 extremities. No joint swelling   Assessment/Plan: 1. Functional deficits which require 3+ hours per day of interdisciplinary therapy in a  comprehensive inpatient rehab setting. Physiatrist is providing close team supervision and 24 hour management of active medical problems listed below. Physiatrist and rehab team continue to assess barriers to discharge/monitor patient progress toward functional and medical goals  Care Tool:  Bathing              Bathing assist       Upper Body Dressing/Undressing Upper body dressing   What is the patient wearing?: Hospital gown only    Upper body assist Assist Level: Minimal Assistance - Patient > 75%    Lower Body Dressing/Undressing Lower body dressing      What is the patient wearing?: Underwear/pull up     Lower body assist Assist for lower body dressing: Minimal Assistance - Patient > 75%     Toileting Toileting    Toileting assist Assist for toileting: Minimal Assistance - Patient > 75%     Transfers Chair/bed transfer  Transfers assist           Locomotion Ambulation   Ambulation assist  Walk 10 feet activity   Assist           Walk 50 feet activity   Assist           Walk 150 feet activity   Assist           Walk 10 feet on uneven surface  activity   Assist           Wheelchair     Assist               Wheelchair 50 feet with 2 turns activity    Assist            Wheelchair 150 feet activity     Assist          Blood pressure (!) 160/70, pulse (!) 54, temperature 98.3 F (36.8 C), temperature source Oral, resp. rate 17, height 5\' 6"  (1.676 m), weight 72.7 kg, SpO2 100 %.  Medical Problem List and Plan: 1. Functional deficits secondary to right basal ganglia infarction             -patient may shower             -ELOS/Goals: 10-14 days, supervision goals with PT and OT             -PRAFO LLE for wear at night 2.  Antithrombotics: -DVT/anticoagulation:  Pharmaceutical: Lovenox             -antiplatelet therapy: Aspirin 81 mg daily and Plavix 75 mg day x3 weeks  then aspirin alone 3. Pain Management: Tylenol as needed 4. Mood/Behavior/Sleep: Provide emotional support             -antipsychotic agents: N/A 5. Neuropsych/cognition: This patient is capable of making decisions on her own behalf. 6. Skin/Wound Care: Routine skin checks 7. Fluids/Electrolytes/Nutrition: Routine in and outs with follow-up chemistries 8.  Hypertension.  Toprol-XL 50 mg daily.  Monitor with increased mobility             -watch for excessive bradycardia Vitals:   09/02/21 1924 09/03/21 0325  BP: (!) 148/64 (!) 160/70  Pulse: (!) 55 (!) 54  Resp: 16 17  Temp: 98 F (36.7 C) 98.3 F (36.8 C)  SpO2: 100% 100%  May need to add another agent , consider ACE I  9.  Hypothyroidism.  Synthroid 10.  Hyperlipidemia.  Lipitor 11.  Microcytic anemia.  Iron is within normal limits.  Follow-up H&H. 12.  History of discoid lupus.  Not currently on any medications per med rec. Pt states she has a topical med at home, her husband will bring it in.  Follow-up outpatient. 13.  Tobacco abuse.  Counseling      LOS: 1 days A FACE TO FACE EVALUATION WAS PERFORMED  09/05/21 09/03/2021, 7:27 AM

## 2021-09-04 DIAGNOSIS — I6381 Other cerebral infarction due to occlusion or stenosis of small artery: Secondary | ICD-10-CM | POA: Diagnosis not present

## 2021-09-04 NOTE — Patient Care Conference (Signed)
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 Claudette Laws Social Worker Present: Cecile Sheerer, LCSWA Nurse Present: Chana Bode, RN PT Present: Casimiro Needle, PT OT Present: Barron Schmid, COTA;Other (comment) Dewitt Hoes, OT) SLP Present: Eilene Ghazi, SLP PPS Coordinator present : Fae Pippin, SLP     Current Status/Progress Goal Weekly Team Focus  Bowel/Bladder   Continent of bladder and bowel.  Remain continent  Monitor for any changes. toilet q2 and PRN.   Swallow/Nutrition/ Hydration             ADL's   MODA for bathing tasks from shower, MIN A for UB dressing, MOD A for LB dressing, MOD A for 3/3 toileting tasks, MOD A for transfers  MOD I goals  BADL reeducation, functional mobility, dynamic balance, family ed, DME training   Mobility   supervision bed mobility (compensates using UE's to manage L LE), min/mod assist sit to stand and stand pivot transfers, gait with mod assist +1 (+2 nearby for safety), no AD using PSL AFO on L LE and blocking L knee hyperextension, mod A for 4 stairs with B HRs  supervision overall at ambulatory level  activity tolerance, gait training, transfer training, dynamic standing balance, L LE NMR, DME training, pt education, stair navigation, FES   Communication             Safety/Cognition/  Behavioral Observations            Pain   Pain from time to time.  Pain free.  Assess q shift and PRN.   Skin   Intact but scattered spots due to lupus  prevent infection and prevent skin breakdown  assess q shift and prn     Discharge Planning:  Patient plans to discharge home with assistance fro spouse and sisters. Able to provide 24/7 supervision.   Team Discussion: Patient with history of discoid lupus, hypertension, hyperlipidemia, hypothyroidism, abdominal aortic aneurysm status post rupture and repair, tobacco use, now post right basal ganglia CVA with left UE weakness and trouble with hip and knee flexion.  Patient on target to meet rehab goals: yes, currently needs min assist for bathing at the shower level and min assist for lower body dressing with mod assist for toileting.  Stand pivot with min assist and mod assist to complete 4 steps. Developed compensatory strategies however hyperextends knee and needs mod assist without ans assistive device. Goals for discharge set for supervision overall.   *See Care Plan and progress notes for long and short-term goals.   Revisions to Treatment Plan:  AFO for left ankle   Teaching Needs: Safety, transfers, toileting, medications, dietary modification, etc.   Current Barriers to Discharge: Decreased caregiver support and Home enviroment access/layout  Possible Resolutions to Barriers: Family education OP follow up services DME: RW     Medical Summary Current  Status: discoid lupus with skin lesions, LLE>LUE weakness  Barriers to Discharge: Medical stability   Possible Resolutions to Barriers/Weekly Focus: may need orthotic management of LLE   Continued Need for Acute Rehabilitation Level of Care: The patient requires daily medical management by a physician with specialized training in physical medicine and rehabilitation for the following reasons: Direction of a multidisciplinary physical rehabilitation program to maximize  functional independence : Yes Medical management of patient stability for increased activity during participation in an intensive rehabilitation regime.: Yes Analysis of laboratory values and/or radiology reports with any subsequent need for medication adjustment and/or medical intervention. : Yes   I attest that I was present, lead the team conference, and concur with the assessment and plan of the team.   Chana Bode B 09/04/2021, 2:32 PM

## 2021-09-04 NOTE — Progress Notes (Signed)
PROGRESS NOTE   Subjective/Complaints:  Husband brought in Halobetasol for discoid lupus lesions ROS- denies CP, SOB, N/V/D  Objective:   No results found. Recent Labs    09/02/21 1642 09/03/21 0542  WBC 4.1 4.5  HGB 10.8* 11.0*  HCT 33.7* 33.2*  PLT 171 167    Recent Labs    09/02/21 0417 09/02/21 1539 09/03/21 0542  NA 141  --  140  K 4.0  --  4.1  CL 112*  --  109  CO2 25  --  24  GLUCOSE 97  --  96  BUN 12  --  13  CREATININE 0.79 0.84 0.73  CALCIUM 8.8*  --  8.9     Intake/Output Summary (Last 24 hours) at 09/04/2021 0721 Last data filed at 09/03/2021 1810 Gross per 24 hour  Intake 240 ml  Output 1 ml  Net 239 ml         Physical Exam: Vital Signs Blood pressure (!) 142/80, pulse 62, temperature 98.2 F (36.8 C), temperature source Oral, resp. rate 20, height 5' 6"  (1.676 m), weight 72.7 kg, SpO2 99 %.   General: No acute distress Mood and affect are appropriate Heart: Regular rate and rhythm no rubs murmurs or extra sounds Lungs: Clear to auscultation, breathing unlabored, no rales or wheezes Abdomen: Positive bowel sounds, soft nontender to palpation, nondistended Extremities: No clubbing, cyanosis, or edema Skin: depigmented erythematous plaques forehead neck arms , no drainage  Neurologic: Cranial nerves II through XII intact, motor strength is 5/5 in RIght , 4/5 left  deltoid, bicep, tricep, grip, 5/5 R and 2/5 left hip flexor, knee extensors, ankle dorsiflexor and plantar flexor Sensory exam normal sensation to light touch  in bilateral upper and lower extremities Cerebellar exam normal finger to nose to finger as well as heel to shin in bilateral upper and lower extremities Finger to thumb opposition slower on left than right side  Musculoskeletal: Full range of motion in all 4 extremities. No joint swelling   Assessment/Plan: 1. Functional deficits which require 3+ hours per day of  interdisciplinary therapy in a comprehensive inpatient rehab setting. Physiatrist is providing close team supervision and 24 hour management of active medical problems listed below. Physiatrist and rehab team continue to assess barriers to discharge/monitor patient progress toward functional and medical goals  Care Tool:  Bathing    Body parts bathed by patient: Right arm, Left arm, Chest, Abdomen, Front perineal area, Right upper leg, Left upper leg, Face   Body parts bathed by helper: Buttocks, Right lower leg, Left lower leg     Bathing assist Assist Level: Moderate Assistance - Patient 50 - 74%     Upper Body Dressing/Undressing Upper body dressing   What is the patient wearing?: Pull over shirt    Upper body assist Assist Level: Minimal Assistance - Patient > 75%    Lower Body Dressing/Undressing Lower body dressing      What is the patient wearing?: Underwear/pull up, Pants     Lower body assist Assist for lower body dressing: Moderate Assistance - Patient 50 - 74%     Toileting Toileting    Toileting assist Assist for toileting:  Moderate Assistance - Patient 50 - 74%     Transfers Chair/bed transfer  Transfers assist  Chair/bed transfer activity did not occur: Safety/medical concerns  Chair/bed transfer assist level: Moderate Assistance - Patient 50 - 74%     Locomotion Ambulation   Ambulation assist      Assist level: 2 helpers Assistive device: Hand held assist Max distance: 22f   Walk 10 feet activity   Assist     Assist level: 2 helpers Assistive device: Hand held assist   Walk 50 feet activity   Assist Walk 50 feet with 2 turns activity did not occur: Safety/medical concerns         Walk 150 feet activity   Assist Walk 150 feet activity did not occur: Safety/medical concerns         Walk 10 feet on uneven surface  activity   Assist Walk 10 feet on uneven surfaces activity did not occur: Safety/medical  concerns         Wheelchair     Assist Is the patient using a wheelchair?: Yes (using for energy conservation and time management between therapy gym and room.) Type of Wheelchair: Manual    Wheelchair assist level: Dependent - Patient 0%      Wheelchair 50 feet with 2 turns activity    Assist        Assist Level: Dependent - Patient 0%   Wheelchair 150 feet activity     Assist      Assist Level: Dependent - Patient 0%   Blood pressure (!) 142/80, pulse 62, temperature 98.2 F (36.8 C), temperature source Oral, resp. rate 20, height 5' 6"  (1.676 m), weight 72.7 kg, SpO2 99 %.  Medical Problem List and Plan: 1. Functional deficits secondary to right basal ganglia infarction             -patient may shower             -ELOS/Goals: 10-14 days, supervision goals with PT and OT, Team conference today please see physician documentation under team conference tab, met with team  to discuss problems,progress, and goals. Formulized individual treatment plan based on medical history, underlying problem and comorbidities.              -PRAFO LLE for wear at night 2.  Antithrombotics: -DVT/anticoagulation:  Pharmaceutical: Lovenox             -antiplatelet therapy: Aspirin 81 mg daily and Plavix 75 mg day x3 weeks then aspirin alone 3. Pain Management: Tylenol as needed 4. Mood/Behavior/Sleep: Provide emotional support             -antipsychotic agents: N/A 5. Neuropsych/cognition: This patient is capable of making decisions on her own behalf. 6. Skin/Wound Care: Routine skin checks 7. Fluids/Electrolytes/Nutrition: Routine in and outs with follow-up chemistries 8.  Hypertension.  Toprol-XL 50 mg daily.  Monitor with increased mobility             -watch for excessive bradycardia Vitals:   09/04/21 0452 09/04/21 0503  BP: (!) 142/80   Pulse: (!) 52 62  Resp: 20   Temp: 98.2 F (36.8 C)   SpO2: 99%   May need to add another agent , consider ACE I- cont to  monitor   9.  Hypothyroidism.  Synthroid 10.  Hyperlipidemia.  Lipitor 11.  Microcytic anemia.  Iron is within normal limits.  Follow-up H&H. 12.  History of discoid lupus. Halobetasol cream BID  Follow-up outpatient. 13.  Tobacco abuse.  Counseling      LOS: 2 days A FACE TO FACE EVALUATION WAS PERFORMED  Susan Farrell 09/04/2021, 7:21 AM

## 2021-09-04 NOTE — Progress Notes (Addendum)
Patient ID: Lekeya J Mcnelly, female   DOB: 07/30/1945, 75 y.o.   MRN: [id number]  This SW covering for primary SW, Christina Baskerville.   SW met with pt in room to provide updates from team conference, and d/c date 8/29. She confirms that her husband will be able to assist and in the home 24/7, and her sisters PRN. She is aware SW will call her husband. Preferred outpatient location is [facility]. SW will order RW. Pt reports she has a rollator. SW shared the difference between each.   1202- SW left message for pt husband Ronald informing on above, and requested return phone  call to schedule family education.   SW ordered RW with [employer] via parachute.   *SW received return phone call from pt husband to discuss above. Fam edu scheduled for Thursday (8/24) 1pm-4pm.   Auria Chamberlain, MSW, LCSWA Office: [phone] Cell: [phone] Fax: [phone]  

## 2021-09-04 NOTE — Progress Notes (Signed)
Occupational Therapy Session Note  Patient Details  Name: Susan Farrell MRN: [id number] Date of Birth: 05/06/1945  Today's Date: 09/04/2021 OT Individual Time: 2440-1027 session 1 OT Individual Time Calculation (min): 70 min  Session 2: 2536-6440   Short Term Goals: Week 1:  OT Short Term Goal 1 (Week 1): Patient will complete transfer to toilet with Min A OT Short Term Goal 2 (Week 1): Patient will recall hemi dressing techniques with min cues OT Short Term Goal 3 (Week 1): Patient will  with CGA to pull pants up over hips.  Skilled Therapeutic Interventions/Progress Updates:  Session 1: Pt greeted seated EOB, pt  agreeable to OT intervention. Session focus on BADL reeducation, functional mobility, dynamic standing balance, LLE motor planning/NMR and decreasing overall caregiver burden.   Pt completed squat pivot to w/c to L side with CGA, total a transport into bathroom where pt completed additional squat pivot into TTB with to R side with CGA. Pt reports walkin shower at home with built in seat, no grab bars in her shower at home.  Pt completed bathing with overall MIN A needing assist for balance while pt stood with grab bar to wash LB. Pt exited shower via squat pivot with CGA. Pt completed dressing from w/c with set- up assist for UB dressing, and MINA for LB dressing needing assist to pull pants up to waist line on L side. Pt completed w/c propulsion to gym with MIN A as pt with noted weakness on L side having difficulty propelling w/c on L side. In gym, worked on various therapeutic activities to facilitate improved LLE GM control and LLE NMR:      - pt completed 1 min of standing Cone taps however pt unable to elevate LLE up to cone therefore downgraded task and had pt tap bean bag on floor. Pt completed task with BUE support and CGA.  - pt instructed to step onto numbers on floor in anterior and lateral plane as precursor to functional gait with LLE, pt needed MIN verbal cues to fully  shift weight onto LLE for NMR but complete task with CGA.  - worked on functional ambulation with rw for ADL participation with pt able to ambulate 14 ft with Rw MINA, MIN verbal cues needed for gait pattern in conjunction with Rw.  - pt then ambulated 75 ft with Rw MIN A with chair follow, improved swing with LLE.  Pt transported back to room in w/c with total A where  pt left supine in bed with all needs within reach.                      Session 2: Pt greeted seated EOB, pt agreeable to OT intervention.  Pt completed ambulatory transfer from room into hallway ~ 50 ft with RW and MINA. Pt reported fatigue needing to be transported remainder of distance.  Worked on below therapeutic actvities to facilitate improved LB strength, dynamic balance, and BUE strength/endurance: -worked on standing targeted steps with LLE with pt instructed to step forward onto 2 inch connect four piece with an emphasis on improved quad strength in LLE to complete targeted step as precursor to functional gait - pt completed x10 squats in standing on airex cushion with BUE support on RW - seated LAQs with theraband behind LLE with wash cloth placed underneath L heel to facilitate improved quad extension/flexion -pt completed sitting balance task where pt instructed to place squigz on mirror in straight line from sitting  EOM to work LUE coordination and hand strength as squigz require strong grasp to release suction   - standing LB strength with pt instructed to complete standing hip ABD/ADD with BUE support on BLEs, pt completed task with CGA Pt reports LB fatigue with remainder of session focused on BUE strengthening with pt completing therex as indicated below: X10 shoulder flexion  X10 bicep curls X10 shoulder horizontal ABD X10 shoulder extension  X10 alternating punches   Issued pt written HEP to increase carryover  Pt transported back to room where pt completed stand pivot back to bed with Rw and mINA. Pt left  seated EOB with all needs within reach.                 Therapy Documentation Precautions:  Precautions Precautions: Fall Precaution Comments: L hemi Restrictions Weight Bearing Restrictions: No Pain: unrated pain reported in neck from sleeping  in hospital bed, no intervention needed.     Therapy/Group: Individual Therapy  Barron Schmid 09/04/2021, 12:11 PM

## 2021-09-04 NOTE — Progress Notes (Signed)
Orthopedic Tech Progress Note Patient Details:  KEYLANI PERLSTEIN 09/10/1945 [id number]  Called in order to [facility] for an AFO CONSULT   Patient ID: SERINITY WARE, female   DOB: 24-Mar-1945, 76 y.o.   MRN: [id number]  Donald Pore 09/04/2021, 11:27 AM

## 2021-09-04 NOTE — Progress Notes (Signed)
Physical Therapy Session Note  Patient Details  Name: Susan Farrell MRN: [id number] Date of Birth: 1945-07-29  Today's Date: 09/04/2021 PT Individual Time: 2355-7322 PT Individual Time Calculation (min): 70 min   Short Term Goals: Week 1:  PT Short Term Goal 1 (Week 1): Pt will perform bed mobility with mod-i. PT Short Term Goal 2 (Week 1): Pt will perform sit to stand transfers with CGA and LRAD. PT Short Term Goal 3 (Week 1): Pt will perform stand pivot transfers with min assist and LRAD consistantly. PT Short Term Goal 4 (Week 1): Pt will ambulate 100 ft with min A and LRAD.  Skilled Therapeutic Interventions/Progress Updates:    Pt received supine in bed and agreeable to therapy session. Supine>sitting R EOB, HOB slightly elevated and using bedrail, with supervision. Sitting EOB, donned shoes and L LE [place] Walk-on PLS AFO total assist for time management.   Sit>stand EOB>L UE support around therapist with light min assist for balance - still frequently pushes backs of legs against seat for stability.  Gait training 56ft using L UE support around therapist with light mod assist of 1 for balance (+2 w/c follow to allow gait towards gym) and pt demonstrating the following gait deviations with therapist providing the described cuing and facilitation for improvement:  - decreased L LE hip/knee flexion for foot clearance during swing  - L LE externally rotated due to toe drag during swing - L knee slightly flexed during stance otherwise snaps back into hyperextension  - L hip instability during stance  Transported remainder of distance to gym in w/c.  Provided pt with RW and performed additional 39ft gait training requiring only min assist for balance - continues to lack full L LE hip/knee flexion for foot clearance during swing although slightly better than without AD resulting in decreased hip external rotation - demos improved L knee extension during stance.  Gait training  additional 7ft using B HHA and +2 min/mod assist with pt demonstrating more similar gait mechanics as when using RW but occasionally will start to have more of a step-to pattern leading with L LE and won't extend L knee fully during stance with slight L hip instability - requiring cuing for improvement.   L LE NMR targeting improved stance control to get terminal knee extension without hyperextension and improve L hip abductor activation to avoid pelvic trendelenburg via performing R lateral foot taps to 4" step with mirror feedback - light mod assist for balance and therapist guarding L knee to avoid hyperextension.  Gait training 63ft targeting carryover of L stance control with B HHA from +2 with more +2 heavy min assist at this time - continued cuing for longer, reciprocal stepping patterns and increased L knee extension during stance.  Attempted L LE NMR for improved stance control via R LE forward/backwards stepping towards external target with mirror feedback- focusing on L hip/knee extension and maintaining balance while moving R LE but pt with difficulty motor planning/sequencing this - pt with strong L LOB during this task having difficulty keeping trunk upright in midline, due to often having L lateral trunk flexion with LOB.  Transitioned to supine on mat, pt able to lift L LE without assist nor UE compensation but does have to extend LE to lift it rather than using hip/knee flexion.   Supine bridging x15 reps, added ball between knees targeting hip adductor and internal rotation activation 2x15 reps due to pt tendency for L knee to fall out into hip  abduction.  Supine>sitting L EOM, with min assist for L LE management.   Gait training ~120-110ft back to room using RW with min assist of 1 for balance - pt continues to have insufficient L hip/knee flexion for foot clearance during swing causing slight L LE external rotation - continues to have slight L knee flexion during stance.   At end  of session, pt left sitting EOB with needs in reach and bed alarm on.   Therapy Documentation Precautions:  Precautions Precautions: Fall Precaution Comments: L hemi Restrictions Weight Bearing Restrictions: No   Pain:  No reports of pain throughout session.    Therapy/Group: Individual Therapy  Ginny Forth , PT, DPT, NCS, CSRS 09/04/2021, 3:31 PM

## 2021-09-04 NOTE — IPOC Note (Signed)
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)     Functional Problem List: Nursing Bowel, Endurance, Safety, Medication Management  PT Balance, Pain, Endurance, Motor, Skin Integrity, Safety, Sensory  OT Balance, Endurance, Motor, Safety  SLP    TR         Basic ADL's: OT Grooming, Dressing, Bathing, Toileting     Advanced  ADL's: OT       Transfers: PT Bed Mobility, Bed to Chair, Car, [employer], Civil Service fast streamer, Research scientist (life sciences): PT Ambulation, Stairs     Additional Impairments: OT Fuctional Use of Upper Extremity  SLP        TR      Anticipated Outcomes Item Anticipated Outcome  Self Feeding    Swallowing      Basic self-care  mod I  Toileting  MOD I   Bathroom Transfers SUPERVISION  Bowel/Bladder  manage bowel w mod I  Transfers  supervision use LRAD  Locomotion  supervision using LRAD  Communication     Cognition     Pain  n/a  Safety/Judgment  maintain safety w cues   Therapy Plan: PT Intensity: Minimum of 1-2 x/day ,45 to 90 minutes PT Frequency: 5 out of 7 days PT Duration Estimated Length of Stay: 2 weeks OT Intensity: Minimum of 1-2 x/day, 45 to 90 minutes OT Frequency: 5 out of 7 days OT Duration/Estimated Length of Stay: 10-14 days     Team Interventions: Nursing Interventions Patient/Family Education, Bowel Management, Discharge Planning, Medication Management, Disease Management/Prevention  PT interventions Ambulation/gait training, Balance/vestibular training, Discharge planning, DME/adaptive equipment instruction, Functional mobility training, Pain management, Splinting/orthotics, Therapeutic Activities, Psychosocial support, UE/LE Strength taining/ROM, UE/LE Coordination activities,  Therapeutic Exercise, Stair training, Patient/family education, Neuromuscular re-education, Functional electrical stimulation, Disease management/prevention, Community reintegration, Skin care/wound management  OT Interventions Warden/ranger, Cognitive remediation/compensation, Discharge planning, Disease mangement/prevention, DME/adaptive equipment instruction, Community reintegration, Neuromuscular re-education, Pain management, Functional electrical stimulation, Functional mobility training, Patient/family education, Psychosocial support, Self Care/advanced ADL retraining, Therapeutic Activities, Skin care/wound managment, Splinting/orthotics, Therapeutic Exercise, UE/LE Strength taining/ROM, UE/LE Coordination activities, Wheelchair propulsion/positioning  SLP Interventions    TR Interventions    SW/CM Interventions Discharge Planning, Psychosocial Support, Patient/Family Education   Barriers to Discharge MD  Lack of/limited family support  Nursing Decreased caregiver support, Home environment access/layout 1 level 2 ste w spouse  PT      OT      SLP      SW Insurance for SNF coverage, Lack of/limited family support, Decreased caregiver support     Team Discharge Planning: Destination: PT-Home ,OT- Home , SLP-  Projected Follow-up: PT-Outpatient PT, 24 hour supervision/assistance, OT-  Outpatient OT, SLP-  Projected Equipment Needs: PT-To be determined, OT- To be determined, SLP-  Equipment Details: PT- , OT-Patient has a built in shower seat Patient/family involved in discharge planning: PT- Patient,  OT-Patient, SLP-   MD ELOS: 10-14d Medical Rehab Prognosis:  Good Assessment: The patient has been admitted for CIR therapies with the diagnosis of RIght BG infarct . The team will be addressing functional mobility, strength, stamina, balance, safety, adaptive techniques and equipment, self-care, bowel and bladder mgt, patient and caregiver education, orthotic management .  Goals have been set at mod I/Sup. Anticipated discharge destination is home with husband.  See Team Conference Notes for weekly updates to the plan of care

## 2021-09-05 DIAGNOSIS — I6381 Other cerebral infarction due to occlusion or stenosis of small artery: Secondary | ICD-10-CM | POA: Diagnosis not present

## 2021-09-05 NOTE — Progress Notes (Signed)
Physical Therapy Session Note  Patient Details  Name: Susan Farrell MRN: [id number] Date of Birth: Jul 02, 1945  Today's Date: 09/05/2021 PT Individual Time: 1650-1730 PT Individual Time Calculation (min): 40 min   Short Term Goals: Week 1:  PT Short Term Goal 1 (Week 1): Pt will perform bed mobility with mod-i. PT Short Term Goal 2 (Week 1): Pt will perform sit to stand transfers with CGA and LRAD. PT Short Term Goal 3 (Week 1): Pt will perform stand pivot transfers with min assist and LRAD consistantly. PT Short Term Goal 4 (Week 1): Pt will ambulate 100 ft with min A and LRAD.  Skilled Therapeutic Interventions/Progress Updates:    Pt greeted sitting EOB and agreeable to therapy. Squat-pivot transfer to the left performed with supervision from EOB to [facility] and pt was transported to first floor and outside near [facility] entrance to work on ambulation on uneven ground and navigation of ramps. Pt amb 171f x 3 using RW with seated rest breaks in WC between bouts due to fatigue.  Gait belt and CGA/min assist needed for balance and stability. Gait was a step to pattern for increased stability with left foot leading. Pt frequently dragged left foot and toe on the ground due to decreased left hip flexor activation and strength that required cues for correction. She also required cuing to keep RW close to BOS when clearing the left foot due to pt allowing RW to get too far anteriorly outside of BOS when left toe would get caught on the ground during swing through. Pt showed carry over of education during last 1235f Pt [facility] and [facility]. Pt amb 5 ft from WC to EOB using RW with CGA and was left seated at EOB with dinner, call bell in reach, and all needs met.   Therapy Documentation Precautions:  Precautions Precautions: Fall Precaution Comments: L hemi Restrictions Weight Bearing Restrictions: No     Therapy/Group: Individual  Therapy  AnKayleen MemosSPT 09/05/2021, 5:38 PM

## 2021-09-05 NOTE — Progress Notes (Signed)
Physical Therapy Session Note  Patient Details  Name: Susan Farrell MRN: [id number] Date of Birth: 07/22/1945  Today's Date: 09/05/2021 PT Individual Time: [phone] and 1405-1505 PT Individual Time Calculation (min): 43 min and 60 min  Short Term Goals: Week 1:  PT Short Term Goal 1 (Week 1): Pt will perform bed mobility with mod-i. PT Short Term Goal 2 (Week 1): Pt will perform sit to stand transfers with CGA and LRAD. PT Short Term Goal 3 (Week 1): Pt will perform stand pivot transfers with min assist and LRAD consistantly. PT Short Term Goal 4 (Week 1): Pt will ambulate 100 ft with min A and LRAD.  Skilled Therapeutic Interventions/Progress Updates:    Session 1: Pt received sitting EOB and agreeable to therapy session. Sitting EOB, threaded on pants (cuing for proper sequencing for increased independence) and donned shirt set-up assist - donned tennis shoes with L LE [place] Walk-on PLS AFO max assist. Sit>stand EOB>RW with min assist for balance - pt continues to have difficulty remembering to push up with hands from seat and frequently pushes backs of legs against seat while rising having minor posterior LOB. Pulled pants up over hips with light min assist for balance.   Gait training ~14ft to main therapy gym using RW with light min assist of 1 - pt continues to have decreased hip/knee flexion for swing phase advancement but is able to clear toes sufficiently to slide through - has improved L knee control during stance phase to avoid hyperextension although achieving full extension.  Seated EOM performed L LE hip flexion 3sets of 4-5 reps with muscle fatigue happening very quickly and pt achieving minimal AROM.  Sit>supine supervision, able to lift L LE up onto mat without compensating by pulling up with her hands but still with difficulty.   Performed the following Supine L LE exercises targeting hamstring activation: - single leg hamstring curl with stability ball, requires min  assist to stabilize the ball and improve LLE alignment as pt tends to move into hip abduction and external rotation while flexing 2x10reps - B LE bridging with feet on blue side of BOSU ball, knees partially extended with cuing to "pull the ball" towards her while lifting her hips 2x15reps - facilitation at L ankle to maintain DF position  Gait training ~157ft back to her room using RW with continued light min assist for balance and the above gait deviations still noticed.   Pt left seated on EOB in preparation for OT arrival.   Session 2: Pt received sitting EOB and agreeable to therapy session. Pt already wearing L LE [place] Walk-on PLS AFO. Sit>stand EOB>RW with CGA.   Gait training ~178ft using RW to main therapy gym with CGA for steadying - continues to have decreased L LE hip/knee flexion during swing phase with poor foot clearance during swing that worsens towards end of walk with fatigue - cuing for continuous forward movement of AD and reciprocal stepping pattern as pt tends to perform step-to leading with LLE.  Donned L LE functional electrical stimulation (FES) to hamstrings using [place] device on pre-set NMES L setting using trigger switch to time stimulation during gait and stair navigation for when hamstring activation is needed - started with intensity of 38 increased to 56 for increased muscle activation.  Gait training ~65ft using RW with light min assist of 1 for balance/steadying while providing FES during L swing phase with pt demonstrating no significant improvement in foot clearance compared to without FES (this was  at 38 intensity).   In // bars with B UE support attempted standing L hamstring curls using FES, but pt unable to achieve palpable hamstring activation without e-stim and even with FES unable to sufficiently activate the muscle to move against gravity.  Gait training ~36ft x2 to/from stairs using B HHA with pt having frequent L knee hyperextension during  stance phase that causes her to flex forward at the hips - cuing to maintain upright posture with hips extended and ensure long enough L LE step length - requires +2 min assist for balance.  Stair navigation training ascending/descending 4 steps x3 (6" height) using B HRs with light min assist for balance and L LE management during ascent with goal of L LE NMR so cuing for reciprocal stepping pattern in each direction - pt has good L LE strength to maintain knee position without risk of buckling in both directions; however, requires max manual facilitation to flex L LE up onto next step during ascent.  Gait training ~170ft back to pt's room using B HHA with +2 min assist for balance and therapist providing L knee hyperextension guarding every step with cuing to achieve sufficient L glute and quad activation to fully extend the knee without hyperextension - cuing to maintain upright posture and to take large enough steps for reciprocal pattern.  At end of session, pt left seated on EOB with needs in reach.    Therapy Documentation Precautions:  Precautions Precautions: Fall Precaution Comments: L hemi Restrictions Weight Bearing Restrictions: No   Pain: Session 1: Denies pain during session.  Session 2: Denies pain during session.   Therapy/Group: Individual Therapy  Ginny Forth , PT, DPT, NCS, CSRS 09/05/2021, 7:55 AM

## 2021-09-05 NOTE — Progress Notes (Signed)
PROGRESS NOTE   Subjective/Complaints:  Discussed d/c date, pt has no preference of HH vs OP therapy  ROS- denies CP, SOB, N/V/D  Objective:   No results found. Recent Labs    09/02/21 1642 09/03/21 0542  WBC 4.1 4.5  HGB 10.8* 11.0*  HCT 33.7* 33.2*  PLT 171 167    Recent Labs    09/02/21 1539 09/03/21 0542  NA  --  140  K  --  4.1  CL  --  109  CO2  --  24  GLUCOSE  --  96  BUN  --  13  CREATININE 0.84 0.73  CALCIUM  --  8.9     Intake/Output Summary (Last 24 hours) at 09/05/2021 0759 Last data filed at 09/05/2021 0740 Gross per 24 hour  Intake 1129 ml  Output --  Net 1129 ml         Physical Exam: Vital Signs Blood pressure 139/70, pulse 60, temperature 98.7 F (37.1 C), temperature source Oral, resp. rate 14, height 5\' 6"  (1.676 m), weight 72.7 kg, SpO2 98 %.   General: No acute distress Mood and affect are appropriate Heart: Regular rate and rhythm no rubs murmurs or extra sounds Lungs: Clear to auscultation, breathing unlabored, no rales or wheezes Abdomen: Positive bowel sounds, soft nontender to palpation, nondistended Extremities: No clubbing, cyanosis, or edema Skin: depigmented erythematous plaques forehead neck arms , no drainage  Neurologic: Cranial nerves II through XII intact, motor strength is 5/5 in RIght , 4/5 left  deltoid, bicep, tricep, grip, 5/5 R and 2/5 left hip flexor, knee extensors, ankle dorsiflexor and plantar flexor Sensory exam normal sensation to light touch  in bilateral upper and lower extremities Cerebellar exam normal finger to nose to finger as well as heel to shin in bilateral upper and lower extremities Finger to thumb opposition slower on left than right side  Musculoskeletal: Full range of motion in all 4 extremities. No joint swelling   Assessment/Plan: 1. Functional deficits which require 3+ hours per day of interdisciplinary therapy in a comprehensive  inpatient rehab setting. Physiatrist is providing close team supervision and 24 hour management of active medical problems listed below. Physiatrist and rehab team continue to assess barriers to discharge/monitor patient progress toward functional and medical goals  Care Tool:  Bathing    Body parts bathed by patient: Right arm, Left arm, Chest, Abdomen, Front perineal area, Right upper leg, Left upper leg, Face, Buttocks, Right lower leg, Left lower leg   Body parts bathed by helper: Buttocks, Right lower leg, Left lower leg     Bathing assist Assist Level: Minimal Assistance - Patient > 75%     Upper Body Dressing/Undressing Upper body dressing   What is the patient wearing?: Pull over shirt    Upper body assist Assist Level: Set up assist    Lower Body Dressing/Undressing Lower body dressing      What is the patient wearing?: Underwear/pull up, Pants     Lower body assist Assist for lower body dressing: Minimal Assistance - Patient > 75%     Toileting Toileting    Toileting assist Assist for toileting: Maximal Assistance - Patient 25 -  49%     Transfers Chair/bed transfer  Transfers assist  Chair/bed transfer activity did not occur: Safety/medical concerns  Chair/bed transfer assist level: Contact Guard/Touching assist (squat pivot)     Locomotion Ambulation   Ambulation assist      Assist level: 2 helpers Assistive device: Hand held assist Max distance: 40ft   Walk 10 feet activity   Assist     Assist level: 2 helpers Assistive device: Hand held assist   Walk 50 feet activity   Assist Walk 50 feet with 2 turns activity did not occur: Safety/medical concerns         Walk 150 feet activity   Assist Walk 150 feet activity did not occur: Safety/medical concerns         Walk 10 feet on uneven surface  activity   Assist Walk 10 feet on uneven surfaces activity did not occur: Safety/medical concerns          Wheelchair     Assist Is the patient using a wheelchair?: Yes (using for energy conservation and time management between therapy gym and room.) Type of Wheelchair: Manual    Wheelchair assist level: Dependent - Patient 0%      Wheelchair 50 feet with 2 turns activity    Assist        Assist Level: Dependent - Patient 0%   Wheelchair 150 feet activity     Assist      Assist Level: Dependent - Patient 0%   Blood pressure 139/70, pulse 60, temperature 98.7 F (37.1 C), temperature source Oral, resp. rate 14, height 5\' 6"  (1.676 m), weight 72.7 kg, SpO2 98 %.  Medical Problem List and Plan: 1. 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  Antithrombotics: -DVT/anticoagulation:  Pharmaceutical: Lovenox             -antiplatelet therapy: Aspirin 81 mg daily and Plavix 75 mg day x3 weeks then aspirin alone 3. Pain Management: Tylenol as needed 4. Mood/Behavior/Sleep: Provide emotional support             -antipsychotic agents: N/A 5. Neuropsych/cognition: This patient is capable of making decisions on her own behalf. 6. Skin/Wound Care: Routine skin checks 7. Fluids/Electrolytes/Nutrition: Routine in and outs with follow-up chemistries 8.  Hypertension.  Toprol-XL 50 mg daily.  Monitor with increased mobility             -watch for excessive bradycardia Vitals:   09/04/21 1927 09/05/21 0430  BP: (!) 145/71 139/70  Pulse: 60 60  Resp: 17 14  Temp: 97.8 F (36.6 C) 98.7 F (37.1 C)  SpO2: 100% 98%  BP stabilizing 8/17  9.  Hypothyroidism.  Synthroid 10.  Hyperlipidemia.  Lipitor 11.  Microcytic anemia.  Iron is within normal limits.  Follow-up H&H. 12.  History of discoid lupus. Halobetasol cream BID  Follow-up outpatient. 13.  Tobacco abuse.  Counseling      LOS: 3 days A FACE TO  FACE EVALUATION WAS PERFORMED  9/17 09/05/2021, 7:59 AM

## 2021-09-05 NOTE — Progress Notes (Signed)
Occupational Therapy Session Note  Patient Details  Name: Susan Farrell MRN: [id number] Date of Birth: 1945-02-14  Today's Date: 09/05/2021 OT Individual Time: 3664-4034 OT Individual Time Calculation (min): 61 min    Short Term Goals: Week 1:  OT Short Term Goal 1 (Week 1): Patient will complete transfer to toilet with Min A OT Short Term Goal 2 (Week 1): Patient will recall hemi dressing techniques with min cues OT Short Term Goal 3 (Week 1): Patient will  with CGA to pull pants up over hips.  Skilled Therapeutic Interventions/Progress Updates:  Pt handed off from previous PT session, pt agreeable to OT intervention. Pt reports wanting to be able to squat pivot to [facility] independently, pt completed squat pivot from EOB<>BSC MODI with no LOB therefore updated safety plan and will discuss with rest of team.  Pt completed ambulatory shower transfer to walkin shower with Rw and CGA. Pt entered shower with use of grab bars with CGA. Pt completed bathing with overall CGA.  Pt exited shower wit MIN A via stand pivot to w/c, pt sat prematurely into w/c needing MIN A for increased support. Total A transport out of bathroom to sink in w/c where pt completed seated grooming tasks MODI.  Pt completed dressing from w/c with set- up for UB dressing, and MIN A for LB dressing needing assist for balance while standing to pull pants up to waist line.  Pt transported to gym in w/c with total A for time mgmt. Pt completed below therapeutic activities to facilitate improved LB strength:  - pt ambulated ~ 20 ft with 2 lb ankle weight donned on LLE for increased resistance during gait for functional strengthening -pt completed x20 quad sets in long sitting on mat table with wash cloth placed underneath pts L knee for visual feedback  - seated LAQs from elevated  EOM  with 2lb ankle weight donned -pt transitioned into tall kneeling with MIN A needing assist to transition LLE to EOM, pt propped onto bench on  forearms with pt able to complete lateral hip ABD/ADD to work on hip flexor strength, pt needed MINA to fully elevate LLE, pt completed x20 reps on R and L side.   Pt transferred back to w/c with Rw and CGA, total A transport back to room in w/c. Pt completed squat pivot back to EOB with supervision. Pt left seated EOB with all needs within reach.    Therapy Documentation Precautions:  Precautions Precautions: Fall Precaution Comments: L hemi Restrictions Weight Bearing Restrictions: No  Pain: no pain reported during session     Therapy/Group: Individual Therapy  Pollyann Glen [facility] 09/05/2021, 12:23 PM

## 2021-09-05 NOTE — Plan of Care (Signed)
Patient needs continuous teaching in most areas.  Verba;izes that she understands all is necessary for her to stay healthy.

## 2021-09-06 DIAGNOSIS — I6381 Other cerebral infarction due to occlusion or stenosis of small artery: Secondary | ICD-10-CM | POA: Diagnosis not present

## 2021-09-06 MED ORDER — DOCUSATE SODIUM 100 MG PO CAPS
100.0000 mg | ORAL_CAPSULE | Freq: Every day | ORAL | Status: DC
  Administered 2021-09-06 – 2021-09-13 (×8): 100 mg via ORAL
  Filled 2021-09-06 (×8): qty 1

## 2021-09-06 NOTE — Progress Notes (Addendum)
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 No results for input(s): "WBC", "HGB", "HCT", "PLT" in the last 72 hours.  No results for input(s): "NA", "K", "CL", "CO2", "GLUCOSE", "BUN", "CREATININE", "CALCIUM" in the last 72 hours.   Intake/Output Summary (Last 24 hours) at 09/06/2021 0739 Last data filed at 09/06/2021 0710 Gross per 24 hour  Intake 1056 ml  Output --  Net 1056 ml         Physical Exam: Vital Signs Blood pressure (!) 144/69, pulse 64, temperature 98 F (36.7 C), temperature source Oral, resp. rate 16, height 5\' 6"  (1.676 m), weight 72.7 kg, SpO2 100 %.   General: No acute distress Mood and affect are appropriate Heart: Regular rate and rhythm no rubs murmurs or extra sounds Lungs: Clear to auscultation, breathing unlabored, no rales or wheezes Abdomen: Positive bowel sounds, soft nontender to palpation, nondistended Extremities: No clubbing, cyanosis, or edema Skin: depigmented erythematous plaques forehead neck arms , no drainage  Neurologic: Cranial nerves II through XII intact, motor strength is 5/5 in RIght , 4/5 left  deltoid, bicep, tricep, grip, 5/5 R and 2/5 left hip flexor, knee extensors, ankle dorsiflexor and plantar flexor Sensory exam normal sensation to light touch  in bilateral upper and lower extremities Cerebellar exam normal finger to nose to finger as well as heel to shin in bilateral upper and lower extremities Finger to thumb opposition slower on left than right side  Musculoskeletal: Full range of motion in all 4 extremities. No joint swelling   Assessment/Plan: 1. Functional deficits which require 3+ hours per day of interdisciplinary therapy in a comprehensive inpatient rehab setting. Physiatrist is  providing close team supervision and 24 hour management of active medical problems listed below. Physiatrist and rehab team continue to assess barriers to discharge/monitor patient progress toward functional and medical goals  Care Tool:  Bathing    Body parts bathed by patient: Right arm, Left arm, Chest, Abdomen, Front perineal area, Right upper leg, Left upper leg, Face, Buttocks, Right lower leg, Left lower leg   Body parts bathed by helper: Buttocks, Right lower leg, Left lower leg     Bathing assist Assist Level: Contact Guard/Touching assist     Upper Body Dressing/Undressing Upper body dressing   What is the patient wearing?: Pull over shirt, Bra    Upper body assist Assist Level: Set up assist    Lower Body Dressing/Undressing Lower body dressing      What is the patient wearing?: Underwear/pull up, Pants     Lower body assist Assist for lower body dressing: Minimal Assistance - Patient > 75%     Toileting Toileting    Toileting assist Assist for toileting: Maximal Assistance - Patient 25 - 49%     Transfers Chair/bed transfer  Transfers assist  Chair/bed transfer activity did not occur: Safety/medical concerns  Chair/bed transfer assist level: Supervision/Verbal cueing     Locomotion Ambulation   Ambulation assist      Assist level: Minimal Assistance - Patient >  75% Assistive device: Walker-rolling Max distance: 160ft   Walk 10 feet activity   Assist     Assist level: Minimal Assistance - Patient > 75% Assistive device: Walker-rolling   Walk 50 feet activity   Assist Walk 50 feet with 2 turns activity did not occur: Safety/medical concerns  Assist level: Minimal Assistance - Patient > 75% Assistive device: Walker-rolling    Walk 150 feet activity   Assist Walk 150 feet activity did not occur: Safety/medical concerns         Walk 10 feet on uneven surface  activity   Assist Walk 10 feet on uneven surfaces activity did  not occur: Safety/medical concerns   Assist level: Minimal Assistance - Patient > 75% Assistive device: Walker-rolling   Wheelchair     Assist Is the patient using a wheelchair?: Yes (using for energy conservation and time management between therapy gym and room.) Type of Wheelchair: Manual    Wheelchair assist level: Dependent - Patient 0%      Wheelchair 50 feet with 2 turns activity    Assist        Assist Level: Dependent - Patient 0%   Wheelchair 150 feet activity     Assist      Assist Level: Dependent - Patient 0%   Blood pressure (!) 144/69, pulse 64, temperature 98 F (36.7 C), temperature source Oral, resp. rate 16, height 5\' 6"  (1.676 m), weight 72.7 kg, SpO2 100 %.  Medical Problem List and Plan: 1. 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  Antithrombotics: -DVT/anticoagulation:  Pharmaceutical: Lovenox             -antiplatelet therapy: Aspirin 81 mg daily and Plavix 75 mg day x3 weeks then aspirin alone starting 09/18/21 3. Pain Management: Tylenol as needed 4. Mood/Behavior/Sleep: Provide emotional support             -antipsychotic agents: N/A 5. Neuropsych/cognition: This patient is capable of making decisions on her own behalf. 6. Skin/Wound Care: Routine skin checks 7. Fluids/Electrolytes/Nutrition: Routine in and outs with follow-up chemistries 8.  Hypertension.  Toprol-XL 50 mg daily.  Monitor with increased mobility             -watch for excessive bradycardia Vitals:   09/05/21 1927 09/06/21 0402  BP: 133/70 (!) 144/69  Pulse: 60 64  Resp: 18 16  Temp: 98.3 F (36.8 C) 98 F (36.7 C)  SpO2: 100% 100%  BP stabilizing 8/17  9.  Hypothyroidism.  Synthroid 10.  Hyperlipidemia.  Lipitor 11.  Microcytic anemia.  Iron is within normal limits.  Follow-up  H&H. 12.  History of discoid lupus. Halobetasol cream BID  Follow-up outpatient. 13.  Tobacco abuse.  Counseling    14.  Constipation Fe supp, add colace  LOS: 4 days A FACE TO FACE EVALUATION WAS PERFORMED  9/17 09/06/2021, 7:39 AM

## 2021-09-06 NOTE — Progress Notes (Signed)
Occupational Therapy Session Note  Patient Details  Name: Susan Farrell MRN: [id number] Date of Birth: [birth year]  Today's Date: 09/06/2021 OT Individual Time: 1300-1355 OT Individual Time Calculation (min): 55 min    Short Term Goals: Week 1:  OT Short Term Goal 1 (Week 1): Patient will complete transfer to toilet with Min A OT Short Term Goal 2 (Week 1): Patient will recall hemi dressing techniques with min cues OT Short Term Goal 3 (Week 1): Patient will  with CGA to pull pants up over hips.  Skilled Therapeutic Interventions/Progress Updates:     Pt received in bed with EOB no pain reported  Therapeutic exercise Clamshells 2x10 BLE gravity assisted with manual pressure on BLE (supine for LLE, sidelying for RLE) Bridging with abduction pressure 2x10  Therapeutic activity Sit to stand with no hands x5 Staggered stance sit to stands BLE x5 1LE elevated on 4"block sit to stands x5   Standing balance task with 1LE on 4"block with dynamic reach in mod ranges cross body and over head height in diagonal pattern with BUE. Pt with increased challenge maintaining balance with block under RLE forcing L weight shift/stability with MIN A for posterior and lateral LOB. Pt requires min cuing for not using mat at back of knee as balance compensation  Pt left at end of session in bed with exit alarm on, call light in reach and all needs met   Therapy Documentation Precautions:  Precautions Precautions: Fall Precaution Comments: L hemi Restrictions Weight Bearing Restrictions: No General:    Therapy/Group: Individual Therapy  [name] 09/06/2021, 6:58 AM

## 2021-09-06 NOTE — Progress Notes (Signed)
Physical Therapy Session Note  Patient Details  Name: Susan Farrell MRN: [id number] Date of Birth: 26-Dec-1945  Today's Date: 09/06/2021 PT Individual Time: 1006-1101 and  800-858 PT Individual Time Calculation (min): 55 min  and 58 min  Short Term Goals: Week 1:  PT Short Term Goal 1 (Week 1): Pt will perform bed mobility with mod-i. PT Short Term Goal 2 (Week 1): Pt will perform sit to stand transfers with CGA and LRAD. PT Short Term Goal 3 (Week 1): Pt will perform stand pivot transfers with min assist and LRAD consistantly. PT Short Term Goal 4 (Week 1): Pt will ambulate 100 ft with min A and LRAD. Week 2:    Week 3:     Skilled Therapeutic Interventions/Progress Updates:      Therapy Documentation AM SESSION PAIN Pt initially seated at edge of bed, dressed, therapist assists w/donning AFO, pt able to don R shoe. stand pivot transfer to wc w/min assist. Wc propulsion x 26ft w/LLE for focus on L HS isolation, min to mod assist, cues.  NMRE: Standing w/RW lifting/lowing LLE leading w/heel to tap airex pad for hip flexion/hamstring isolation. stand pivot transfer to mat cga Sit to supine cga Placing and holding LLE heelslide first w/assit to place, then pt holds position up to 10 sec resisting legs tendency to retun to straight.x 10 reps.  Supine to prone w/supervision. Initially pt unable to activate hamstrings.  Used placing/tapping w/trace contraction which increased w/repitition.  Pt then able to activate HS progressively initially 10*/held by therapist in position/further activated 10-20/held, further activated etc./improved contraction to activating antigravity from full extension to approx 40* flexion and repeated at this point 2 sets of 5.  Supervision returns to sitting, cga spt to wc, propels w/US x 33ft mod I.   Pt left oob in wc w/needs in reach.   PM SESSION PAIN Denies pain  Gait trials w/emphasis on cadence/increasing speed, use of counting/metronome style  to equalize step lenth/stance time/symmetry, cues to facilitate clearance and heelstrike/mechanics cga w/RW. 145ft x 4  decreased clearance worsens w/fatigue and w/wpushing pace/symmetry.  Step ups leading up L to force hip and knee activation, down backing w/R for lowering via LLE/single step reps 5in step x 8, 3in step x 10.  Functional gait weaving thru cones and turning to enter/exit doorway mult times w/significant increase in deviations/decreased clearance, decreased symmetry. 59ft x 4 w/RW,cga  Gait  100 ft w/RW at pt selected speed/cues for equal stance/swing time, cga.   Pt demonstrates poor technique w/transfers choosing to pivot to wc primarily on RLE consistently prior to fully backing w/L.  Reviewed mechanics and  befits of safety, efficiency, and "normal" appearance which of three is pt primary concern.  Following instruction pt was able to demonstrate good mechanics and safety w/gentle reminders/cueing only.  stand pivot transfer wc to bed w/cues for set up to ensure ability to use safe mechanics as discussed, cga and use of bedrail.   Therapy/Group: Individual Therapy Rada Hay, PT   Shearon Balo 09/06/2021, 12:03 PM

## 2021-09-06 NOTE — Progress Notes (Signed)
Patient ID: Susan Farrell, female   DOB: 10-Apr-1945, 76 y.o.   MRN: [id number]  Referral faxed to [facility] for follow up. Rw ordered through [employer].

## 2021-09-06 NOTE — Evaluation (Signed)
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 No pertinent past medical history. Past Surgical History:       Past Surgical History:  Procedure Laterality Date   ABDOMINAL HYSTERECTOMY       CHOLECYSTECTOMY       ENDOVASCULAR REPAIR/STENT GRAFT N/A 06/12/2020    Procedure: ENDOVASCULAR REPAIR/STENT GRAFT;  Surgeon: Katha Cabal, MD;  Location: [facility];  Service: Cardiovascular;  Laterality: N/A;      Assessment & Plan Clinical Impression: Patient is a 76 y.o. year old female  right-handed female with history of discoid lupus, hypertension, hyperlipidemia, hypothyroidism, abdominal aortic aneurysm status post rupture and repair, tobacco use.  Per chart review patient lives with spouse.  1 level home 2 steps to entry.  Independent prior to admission.  Presented to [facility] 08/28/2021 with acute onset of left-sided weakness.  Denied any headache or dizziness or blurred vision.  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  Underlying age-related cerebral atrophy with mild chronic small vessel ischemic disease with additional chronic right basal ganglia lacunar infarction.  CT angiogram head and neck showed internal carotid and vertebral arteries to be patent.  There was a two-point centimeter left thyroid lobe nodule previously evaluated by ultrasound 02/08/2013.  No intracranial large vessel occlusion or high-grade stenosis.  1-2 mm ventrally projecting vascular protrusion arising from the cavernous right ICA suspicious for aneurysm.  Admission chemistries unremarkable except glucose 112, hemoglobin A1c 5.7, urine drug screen negative.  Echocardiogram  with ejection fraction of 60 to 65% no wall motion abnormalities grade 1 diastolic dysfunction.  Currently maintained on low-dose aspirin 81 mg daily as well as Plavix for CVA prophylaxis x3 weeks followed by aspirin monotherapy.  Subcutaneous Lovenox for DVT prophylaxis.  Hospital course patient did sustain fall 08/30/2021 when attempting to get out of bed without assistance with no injury sustained.  Tolerating a regular consistency diet.  Therapy evaluations completed due to patient's left-sided weakness decreased functional mobility was admitted for a comprehensive rehab program. Patient transferred to CIR on 09/02/2021 .    Pt presents with decreased activity tolerance, decreased functional mobility, decreased balance, decreased coordination Limiting pt's independence with leisure/community pursuits.  Met with pt today to discuss TR services including leisure education, activity analysis/modifications and stress management.  Also discussed the importance of social, emotional, spiritual health in addition to physical health and their effects on overall health and wellness.  Pt stated understanding.    Plan  Min 1 TR session per week during LOS >20 minutes  Recommendations for other services: None   Discharge Criteria: Patient will be discharged from TR if patient refuses treatment 3 consecutive times without medical reason.  If treatment goals not met, if there is a change in medical status, if patient makes no progress towards goals or if patient is discharged from hospital.  The above assessment, treatment plan, treatment alternatives and goals were discussed and mutually agreed upon: by patient  [place] 09/06/2021, 9:51 AM

## 2021-09-07 DIAGNOSIS — K5901 Slow transit constipation: Secondary | ICD-10-CM

## 2021-09-07 NOTE — Progress Notes (Signed)
Physical Therapy Session Note  Patient Details  Name: KARYSSA AMARAL MRN: [id number] Date of Birth: November 15, 1945  Today's Date: 09/07/2021 PT Individual Time: 1102-1155 session 1; 13:47-14:45 session 2 PT Individual Time Calculation (min): 53 min session 1; 58 minutes session 2  Short Term Goals: Week 1:  PT Short Term Goal 1 (Week 1): Pt will perform bed mobility with mod-i. PT Short Term Goal 2 (Week 1): Pt will perform sit to stand transfers with CGA and LRAD. PT Short Term Goal 3 (Week 1): Pt will perform stand pivot transfers with min assist and LRAD consistantly. PT Short Term Goal 4 (Week 1): Pt will ambulate 100 ft with min A and LRAD.  Skilled Therapeutic Interventions/Progress Updates:  SESSION 1 Pt seated on edge of bed on arrival to room. Agreeable to PT at this time. Did state need to use bathroom before leaving room.  TRANSFERS: Stand<>pivot bed<>BSC Independent (Independent with pericare and clothing management as well) Sit<>stands throughout session to/from RW with SBA, cues on hand placement, weight shifting.   STRENGTHENING  Seated edge of mat table: left LE ex's with cues for form and technique.  With red band- assisted DF/resisted PFD X 20 reps with assist to stabilize LE/keep heel on floor, hamstring curls x 20 reps, and LAQs x 20 reps, Single LE hip fall outs x 20 reps and marching x 10 reps.   Seated at edge of bed: feet in parallel position for sit<>stands x 10 reps with no UE support, then with feet in staggered stance with left foot back/right foot forward for sit<>stands x 10 reps.   GAIT: Gait pattern: step through pattern, decreased step length- Right, decreased stance time- Left, decreased hip/knee flexion- Left, decreased ankle dorsiflexion- Left, genu recurvatum- Left, narrow BOS, and poor foot clearance- Left Distance walked: room to/from rehab gym, around gym with session Assistive device utilized: Walker - 2 wheeled and left AFO, heel wedges and  simulated toe cap Level of assistance: CGA and Min A Comments: use of posterior [place] Walk On brace with gait from room to gym with toe scuffing and genu recurvatum noted to occur at times. In gym added simulated toe cap for 1 lap down gym/back with improved ability to bring LE through with swing phase. Continued genu recurvatum noted. Added small heel wedge with 2cd lap down/back with mild improvement in genu recurvatum noted, therefore added second wedge. 3rd lap performed with no occurrence of genu recurvatum noted.     STAIRS: Level of Assistance: CGA and Min A Stair Negotiation Technique: Backwards, then Forwards With use of AD: RW with No Rails Number of Stairs: single step next to platform  Height of Stairs: 6 inch step, then 1-2 inches more for platform height  Comments: working on stair setup similar to what she has at home  Pt left seated EOB with all needs in reach.  SESSION 2 Pt seated EOB with family in room. Agreeable to PT at this time.    GAIT: Gait pattern:   , step through pattern, decreased step length- Right, decreased stance time- Left, and narrow BOS Distance walked: pt's room to rehab gym, laps across gym with theraband (see below), rehab gym to [employer] in [place], then from Dayroom to hallway at [employer]. (Pt then transported remainder of way to room via wheelchair) Assistive device utilized: Walker - 2 wheeled and brace, heel wedged, simulated toe cap, and blue theraband Level of assistance: SBA and CGA Comments: use of AFO/heel wedges/simulated to cap for  long distance hallway gait with no recurvatum noted. Cues for decreased force/velocity with left LE advancement. In rehab gym with use of blue theraband for assisted DF/crossed behind knee to prevent recurvatum and a secured at gait belt with shoe cover for simulated toe cap to assist with hip/knee flexion with swing phase for 2 long laps. Use of band to facilitate/improve muscle recruitment and for NMR  purposes. Pt did report she felt it was easier to lift/advance her LE with use of the band.   BALANCE/NMR:  At bottom of steps with bil rails: with blue band to left LE as stated with gait above. CGA to min assist with below activities.  Right stance- left foot taps up/down bottom 2 steps x 10 reps. Assist/cues for increased hip flexion to lift foot and not slide it.  Left stance: right foot taps up/down bottom steps x 10 reps, cues/facilitation for increased left weight shifting in stance, to slow right taps down for increased time on left LE and for increased quad activation in stance.  Left foot on bottom step: forward stepping right foot up/back down to floor x 10 reps.  STRENGTHENING  [facility] UE/LE's level 5 x 10 minutes with goal >/= 50 steps per minute for strengthening, reciprocal movement patterns and activity tolerance.  Seated at edge of bed: feet in parallel position for sit<>stands x 10 reps with no UE support, then with feet in staggered stance with left foot back/right foot forward for sit<>stands x 10 reps.  Pt left in room sitting at edge of bed with all needs in reach.      Therapy Documentation Precautions:  Precautions Precautions: Fall Precaution Comments: L hemi Restrictions Weight Bearing Restrictions: No      Therapy/Group: Individual Therapy Sallyanne Kuster, PTA, [facility] 09/07/21, 4:37 PM

## 2021-09-07 NOTE — Progress Notes (Signed)
Physical Therapy Session Note  Patient Details  Name: Susan Farrell MRN: [id number] Date of Birth: 1945/05/14  Today's Date: 09/07/2021 PT Individual Time: 1648-1730   42 min   Short Term Goals: Week 1:  PT Short Term Goal 1 (Week 1): Pt will perform bed mobility with mod-i. PT Short Term Goal 2 (Week 1): Pt will perform sit to stand transfers with CGA and LRAD. PT Short Term Goal 3 (Week 1): Pt will perform stand pivot transfers with min assist and LRAD consistantly. PT Short Term Goal 4 (Week 1): Pt will ambulate 100 ft with min A and LRAD.   Skilled Therapeutic Interventions/Progress Updates:   Pt received sitting EOB and agreeable to PT. PT assisted pt to don L shoe and AFO. Stand pivot transfer to [facility] with RW and CGA. Transported to entrance of [place]. Gait training wth RW 2 x 59f with CGA and cues for step length on the RLE and improved hip/knee flexion to reduce toe drag. Forward/reverse gait 3 x 171fwith min assist and cues for posture and activation of knee flexion to prevenet foot drag, improved step height on the LLE with increased repetitions. Patient returned to room performed stand pivot transfer to bed with RW and supervision assist. Pt  left sitting EOB with call bell in reach and all needs met.        Therapy Documentation Precautions:  Precautions Precautions: Fall Precaution Comments: L hemi Restrictions Weight Bearing Restrictions: No    Vital Signs: Therapy Vitals Temp: 98.3 F (36.8 C) Temp Source: Oral Pulse Rate: 66 Resp: 16 BP: 137/80 Patient Position (if appropriate): Sitting Oxygen Therapy SpO2: 100 % O2 Device: Room Air Pain:   denies    Therapy/Group: Individual Therapy  AuLorie Phenix/19/2023, 7:28 AM

## 2021-09-07 NOTE — Progress Notes (Signed)
Occupational Therapy Session Note  Patient Details  Name: Susan Farrell MRN: [id number] Date of Birth: October 02, 1945  Today's Date: 09/07/2021 OT Individual Time: 0810-0910 OT Individual Time Calculation (min): 60 min    Short Term Goals: Week 1:  OT Short Term Goal 1 (Week 1): Patient will complete transfer to toilet with Min A OT Short Term Goal 2 (Week 1): Patient will recall hemi dressing techniques with min cues OT Short Term Goal 3 (Week 1): Patient will  with CGA to pull pants up over hips.  Skilled Therapeutic Interventions/Progress Updates: Patient sitting EOB upon approach for OT.   Focus of session was on LUE and LLE neuro re education and strengthing, balance, and lower extremity dressing to increase independence in self care, functional transfers, and function mobility as follows:  - With Contact Guard assist and verbal cues for safety, via RW, patient walked from EOB bathroom shower.  Foot drop and decreased motor control noted  - Shower transfer via RW, tub transfer bench, and shower grabs = verbal cues for safety and technique to back up to tub transfer bench until back of legs touched bench  - UB bathing and dressing = setup .  Patient incorporated left hand into bilateral and unilateral tasks.  Patient stated her left UE feels 'pretty much back to normal.'  She stated she has a bath brush at home, she washed her lower back and rinsed her upper back .   Later in the daythe patient was issued a long handled sponge to ease ROM and safety if needed/where needed.  - LB bathing, sit to stand = CGA for dynamic balance while standing to wash periarea and buttocks and forward bending to wash bilateral feet. This OT Practitioner provided verbal instruction for leg over opposite knee for safety while seated in wet surfaces.    Dynamic sitting balance = CGA toward left of body.       Dynamic standing balance = CGA with verbal and tactile cues to bear weight through her LLE.  Oral  care, seated= mod I  Patient was able to follow instructions for skilled techniques and safety awareness.   Continue OT Plan of care      Therapy Documentation Precautions:  Precautions Precautions: Fall Precaution Comments: L hemi Restrictions Weight Bearing Restrictions: No  Pain:denied    Therapy/Group: Individual Therapy  Bud Face [facility] 09/07/2021, 3:45 PM

## 2021-09-07 NOTE — Progress Notes (Signed)
PROGRESS NOTE   Subjective/Complaints:  Pt feeling well. Up at EOB when I came in. Asked why she's on a heart healthy diet.   ROS: Patient denies fever, rash, sore throat, blurred vision, dizziness, nausea, vomiting, diarrhea, cough, shortness of breath or chest pain, joint or back/neck pain, headache, or mood change.   Objective:   No results found. No results for input(s): "WBC", "HGB", "HCT", "PLT" in the last 72 hours.  No results for input(s): "NA", "K", "CL", "CO2", "GLUCOSE", "BUN", "CREATININE", "CALCIUM" in the last 72 hours.   Intake/Output Summary (Last 24 hours) at 09/07/2021 0835 Last data filed at 09/06/2021 1753 Gross per 24 hour  Intake 462 ml  Output --  Net 462 ml        Physical Exam: Vital Signs Blood pressure 137/80, pulse 66, temperature 98.3 F (36.8 C), temperature source Oral, resp. rate 16, height 5\' 6"  (1.676 m), weight 72.7 kg, SpO2 100 %.   Constitutional: No distress . Vital signs reviewed. HEENT: NCAT, EOMI, oral membranes moist Neck: supple Cardiovascular: RRR without murmur. No JVD    Respiratory/Chest: CTA Bilaterally without wheezes or rales. Normal effort    GI/Abdomen: BS +, non-tender, non-distended Ext: no clubbing, cyanosis, or edema Psych: pleasant and cooperative  Skin: depigmented erythematous plaques forehead neck arms unchanged in appearance Neurologic: Cranial nerves II through XII intact, motor strength is 5/5 in RIght , 4/5 left  deltoid, bicep, tricep, grip, 5/5 R and 2/5 left hip flexor, knee extensors, ankle dorsiflexor and plantar flexor Sensory exam normal sensation to light touch  in bilateral upper and lower extremities Cerebellar exam normal finger to nose to finger as well as heel to shin in bilateral upper and lower extremities Finger to thumb opposition slower on left than right side. Good sitting balance Musculoskeletal: Full range of motion in all 4  extremities. No joint swelling   Assessment/Plan: 1. Functional deficits which require 3+ hours per day of interdisciplinary therapy in a comprehensive inpatient rehab setting. Physiatrist is providing close team supervision and 24 hour management of active medical problems listed below. Physiatrist and rehab team continue to assess barriers to discharge/monitor patient progress toward functional and medical goals  Care Tool:  Bathing    Body parts bathed by patient: Right arm, Left arm, Chest, Abdomen, Front perineal area, Right upper leg, Left upper leg, Face, Buttocks, Right lower leg, Left lower leg   Body parts bathed by helper: Buttocks, Right lower leg, Left lower leg     Bathing assist Assist Level: Contact Guard/Touching assist     Upper Body Dressing/Undressing Upper body dressing   What is the patient wearing?: Pull over shirt, Bra    Upper body assist Assist Level: Set up assist    Lower Body Dressing/Undressing Lower body dressing      What is the patient wearing?: Underwear/pull up, Pants     Lower body assist Assist for lower body dressing: Minimal Assistance - Patient > 75%     Toileting Toileting    Toileting assist Assist for toileting: Maximal Assistance - Patient 25 - 49%     Transfers Chair/bed transfer  Transfers assist  Chair/bed transfer activity did not  occur: Safety/medical concerns  Chair/bed transfer assist level: Supervision/Verbal cueing     Locomotion Ambulation   Ambulation assist      Assist level: Minimal Assistance - Patient > 75% Assistive device: Walker-rolling Max distance: 126ft   Walk 10 feet activity   Assist     Assist level: Minimal Assistance - Patient > 75% Assistive device: Walker-rolling   Walk 50 feet activity   Assist Walk 50 feet with 2 turns activity did not occur: Safety/medical concerns  Assist level: Minimal Assistance - Patient > 75% Assistive device: Walker-rolling    Walk 150 feet  activity   Assist Walk 150 feet activity did not occur: Safety/medical concerns         Walk 10 feet on uneven surface  activity   Assist Walk 10 feet on uneven surfaces activity did not occur: Safety/medical concerns   Assist level: Minimal Assistance - Patient > 75% Assistive device: Walker-rolling   Wheelchair     Assist Is the patient using a wheelchair?: Yes (using for energy conservation and time management between therapy gym and room.) Type of Wheelchair: Manual    Wheelchair assist level: Dependent - Patient 0%      Wheelchair 50 feet with 2 turns activity    Assist        Assist Level: Dependent - Patient 0%   Wheelchair 150 feet activity     Assist      Assist Level: Dependent - Patient 0%   Blood pressure 137/80, pulse 66, temperature 98.3 F (36.8 C), temperature source Oral, resp. rate 16, height 5\' 6"  (1.676 m), weight 72.7 kg, SpO2 100 %.  Medical Problem List and Plan: 1. Functional deficits secondary to right basal ganglia infarction 08/28/21             -patient may shower             -ELOS/Goals: 10-14 days, supervision goals with PT and OT,              -PRAFO LLE for wear at night  -Continue CIR therapies including PT, OT  2.  Antithrombotics: -DVT/anticoagulation:  Pharmaceutical: Lovenox             -antiplatelet therapy: Aspirin 81 mg daily and Plavix 75 mg day x3 weeks then aspirin alone starting 09/18/21 3. Pain Management: Tylenol as needed 4. Mood/Behavior/Sleep: Provide emotional support             -antipsychotic agents: N/A 5. Neuropsych/cognition: This patient is capable of making decisions on her own behalf. 6. Skin/Wound Care: Routine skin checks 7. Fluids/Electrolytes/Nutrition:  8/19-discussed why she's on a heart healthy diet. She's willing to continue 8.  Hypertension.  Toprol-XL 50 mg daily.  Monitor with increased mobility             -watch for excessive bradycardia Vitals:   09/06/21 1926 09/07/21 0424   BP: 134/71 137/80  Pulse: (!) 57 66  Resp: 14 16  Temp: 98.4 F (36.9 C) 98.3 F (36.8 C)  SpO2: 100% 100%  BP controlled 8/19  9.  Hypothyroidism.  Synthroid 10.  Hyperlipidemia.  Lipitor 11.  Microcytic anemia.  Iron is within normal limits.  Follow-up H&H. 12.  History of discoid lupus. Halobetasol cream BID  Follow-up outpatient. 13.  Tobacco abuse.  Counseling    14.  Constipation Fe supp, add colace  LOS: 5 days A FACE TO FACE EVALUATION WAS PERFORMED  9/19 09/07/2021, 8:35 AM

## 2021-09-09 LAB — CREATININE, SERUM
Creatinine, Ser: 0.85 mg/dL (ref 0.44–1.00)
GFR, Estimated: >60 mL/min (ref >60)

## 2021-09-09 NOTE — Progress Notes (Signed)
Occupational Therapy Session Note  Patient Details  Name: Susan Farrell MRN: [id number] Date of Birth: December 19, 1945  Today's Date: 09/09/2021 OT Individual Time: 1300-1415 OT Individual Time Calculation (min): 75 min    Short Term Goals: Week 1:  OT Short Term Goal 1 (Week 1): Patient will complete transfer to toilet with Min A OT Short Term Goal 2 (Week 1): Patient will recall hemi dressing techniques with min cues OT Short Term Goal 3 (Week 1): Patient will  with CGA to pull pants up over hips.  Skilled Therapeutic Interventions/Progress Updates:    Upon OT arrival, pt semi recumbent in bed and is agreeable to OT treatment session. No pain reported. Treatment intervention with a focus on IADL retraining, dynamic standing balance, LE strengthening/coordination, UE strengthening and endurance and self care retraining. Pt completes supine to sit transfer with SBA and donns shoes EOB with Min A for L LE and completes stand pivot transfer into w/c with CGA. Pt was transported to rehab apartment via w/c and total A for time management. Pt ambulates within the kitchen with RW and Min A-CGA to retrieve items from pantry and transport to countertop and return back into pantry using B UE. Pt requires verbal cues for LE placement when stepping backwards to decrease risk of falls. Pt also instructed to return items in certain sequence and pt placed 3/5 in correct sequence. Pt then retrieves various dishes and silverware from countertop and sorts them back into their designated cabinets and drawers using the RW and Min A-CGA. Pt then retrieves fake fruit items from freezer and fridge using RW and CGA reaching in all planes. Pt requires seated rest breaks between each activity. Pt was transported to main therapy gym via w/c and Total A for time and sits EOB to kick ball using the L UE to therapist for 2x20 reps. Pt requires verbal cues to kick forcefully and to control the descent of the LE. Pt with Max  difficulty controlling the descent. Pt completes stand pivot to w/c from mat and sits at tabletop to flip over and sort quirkle game pieces using B UE and 1lb wrist weight. Pt requires increased time to flip over and sort. Pt then crosses midline to retrieve one time at a time and perform rainbow arc movement to place into bag on opposite side of table. Pt completes consecutively with mod rest breaks. Pt was transported back to her room via w/c and Total A secondary to urge to urinate. Pt ambulates into bathroom with RW and CGA and completes toilet transfer and toileting with CGA. Pt returns to her bed with CGA using RW and washes her hands with setup assist. Pt's friend in room at end of session and pt was left in bed with all needs met.    Therapy Documentation Precautions:  Precautions Precautions: Fall Precaution Comments: L hemi Restrictions Weight Bearing Restrictions: No   Therapy/Group: Individual Therapy  Marvetta Gibbons 09/09/2021, 2:49 PM

## 2021-09-09 NOTE — Progress Notes (Signed)
Occupational Therapy Session Note  Patient Details  Name: Susan Farrell MRN: [id number] Date of Birth: August 08, 1945  Today's Date: 09/09/2021 OT Individual Time: [id number] OT Individual Time Calculation (min): 50 min  25 mins missed as therapist running late    Short Term Goals: Week 1:  OT Short Term Goal 1 (Week 1): Patient will complete transfer to toilet with Min A OT Short Term Goal 2 (Week 1): Patient will recall hemi dressing techniques with min cues OT Short Term Goal 3 (Week 1): Patient will  with CGA to pull pants up over hips.  Skilled Therapeutic Interventions/Progress Updates:  Pt greeted seated in w/c, pt agreeable to OT intervention. Pts ADL needs had already been met, pt completed functional ambulation from room to gym with RW and CGA. Pt completed below therapeutic activities to facilitate improved LLE strength/endurance and dynamic standing balance:                       - pt transitioned into Quadraped by crawling up onto mat with MINA, pt completed x10 hip extensions with LLE, pt unable to complete task on  RLE as pts LLE unable to tolerate the weight.  - pt completed 1 min of repetitive stepping onto 2 inch steps with BUE support to improved LLE hip flexion - pt completed x20 Standing L hip ABD/ADD with level 3 theraband wrapped around LLE, pt completed x20 standing hip extensions with no resistance with BUE support with CGA - utilized Agility ladder to facilitate improved L hip flexion with visual target of ladder provided, pt utilized RW for BUE support with pt able to complete task with CGA- MINA for Rw mgmt, pt completed x2 trials - utilized Agility ladder again for  side stepping with RW support going to L side to facilitate improved L hip ABD/ADD, pt completed task with CGA- MINA - pt able to stand with no UE support from EOM to challenge dynamic standing balance with pt instructed to toss ball to re bounder with overall CGA and no LOB, graded task up and had pt  complete lateral twists to R and L side and then toss ball to work on lateral rotation to further challenge balance.  - pt able to stand from [facility] with no UE support, toss ball, catch and then sit down with an emphasis on LB strength, trunk control and GM coordination, pt completed task with CGA  - pt able to stand from EOM with RLE on block to provide LLE NMR with pt instructed to stand with no UE support, toss ball, catch and then sit down, pt needed MIN A to stand with no UE support but no LOB during task for x10 reps.   Pt reports fatigue requesting to be transported back to room with total A. Pt left seated EOB with all needs within reach.   Therapy Documentation Precautions:  Precautions Precautions: Fall Precaution Comments: L hemi Restrictions Weight Bearing Restrictions: No General: General OT Amount of Missed Time: 25 Minutes    Pain: no pain reported during session     Therapy/Group: Individual Therapy  Precious Haws 09/09/2021, 12:17 PM

## 2021-09-09 NOTE — Progress Notes (Addendum)
PROGRESS NOTE   Subjective/Complaints:   No issues overnite , pt without new issues  ROS: Patient denies CP, SOB, N/V/D  Objective:   No results found. No results for input(s): "WBC", "HGB", "HCT", "PLT" in the last 72 hours.  No results for input(s): "NA", "K", "CL", "CO2", "GLUCOSE", "BUN", "CREATININE", "CALCIUM" in the last 72 hours.   Intake/Output Summary (Last 24 hours) at 09/09/2021 0749 Last data filed at 09/08/2021 1747 Gross per 24 hour  Intake 708 ml  Output --  Net 708 ml         Physical Exam: Vital Signs Blood pressure 138/66, pulse 60, temperature 98.4 F (36.9 C), temperature source Oral, resp. rate 16, height 5\' 6"  (1.676 m), weight 72.7 kg, SpO2 100 %.    General: No acute distress Mood and affect are appropriate Heart: Regular rate and rhythm no rubs murmurs or extra sounds Lungs: Clear to auscultation, breathing unlabored, no rales or wheezes Abdomen: Positive bowel sounds, soft nontender to palpation, nondistended Extremities: No clubbing, cyanosis, or edema Skin: No evidence of breakdown, no evidence of rash   Skin: depigmented erythematous plaques forehead neck arms unchanged in appearance Neurologic: Cranial nerves II through XII intact, motor strength is 5/5 in RIght , 4/5 left  deltoid, bicep, tricep, grip, 5/5 R and 3/5 left hip flexor, knee extensors, trace ankle dorsiflexor and plantar flexor Sensory exam normal sensation to light touch  in bilateral upper and lower extremities Cerebellar exam normal finger to nose to finger as well as heel to shin in bilateral upper and lower extremities Finger to thumb opposition slower on left than right side. Good sitting balance Musculoskeletal: Full range of motion in all 4 extremities. No joint swelling   Assessment/Plan: 1. Functional deficits which require 3+ hours per day of interdisciplinary therapy in a comprehensive inpatient rehab  setting. Physiatrist is providing close team supervision and 24 hour management of active medical problems listed below. Physiatrist and rehab team continue to assess barriers to discharge/monitor patient progress toward functional and medical goals  Care Tool:  Bathing    Body parts bathed by patient: Right arm, Left arm, Chest, Abdomen, Front perineal area, Right upper leg, Left upper leg, Face, Buttocks, Right lower leg, Left lower leg   Body parts bathed by helper: Buttocks, Right lower leg, Left lower leg     Bathing assist Assist Level: Contact Guard/Touching assist     Upper Body Dressing/Undressing Upper body dressing   What is the patient wearing?: Pull over shirt, Bra    Upper body assist Assist Level: Set up assist    Lower Body Dressing/Undressing Lower body dressing      What is the patient wearing?: Underwear/pull up, Pants     Lower body assist Assist for lower body dressing: Minimal Assistance - Patient > 75%     Toileting Toileting    Toileting assist Assist for toileting: Maximal Assistance - Patient 25 - 49%     Transfers Chair/bed transfer  Transfers assist  Chair/bed transfer activity did not occur: Safety/medical concerns  Chair/bed transfer assist level: Supervision/Verbal cueing     Locomotion Ambulation   Ambulation assist      Assist  level: Minimal Assistance - Patient > 75% Assistive device: Walker-rolling Max distance: 18ft   Walk 10 feet activity   Assist     Assist level: Minimal Assistance - Patient > 75% Assistive device: Walker-rolling   Walk 50 feet activity   Assist Walk 50 feet with 2 turns activity did not occur: Safety/medical concerns  Assist level: Minimal Assistance - Patient > 75% Assistive device: Walker-rolling    Walk 150 feet activity   Assist Walk 150 feet activity did not occur: Safety/medical concerns         Walk 10 feet on uneven surface  activity   Assist Walk 10 feet on  uneven surfaces activity did not occur: Safety/medical concerns   Assist level: Minimal Assistance - Patient > 75% Assistive device: Walker-rolling   Wheelchair     Assist Is the patient using a wheelchair?: Yes (using for energy conservation and time management between therapy gym and room.) Type of Wheelchair: Manual    Wheelchair assist level: Dependent - Patient 0%      Wheelchair 50 feet with 2 turns activity    Assist        Assist Level: Dependent - Patient 0%   Wheelchair 150 feet activity     Assist      Assist Level: Dependent - Patient 0%   Blood pressure 138/66, pulse 60, temperature 98.4 F (36.9 C), temperature source Oral, resp. rate 16, height 5\' 6"  (1.676 m), weight 72.7 kg, SpO2 100 %.  Medical Problem List and Plan: 1. 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  Antithrombotics: -DVT/anticoagulation:  Pharmaceutical: Lovenox             -antiplatelet therapy: Aspirin 81 mg daily and Plavix 75 mg day x3 weeks then aspirin alone starting 09/18/21 3. Pain Management: Tylenol as needed 4. Mood/Behavior/Sleep: Provide emotional support             -antipsychotic agents: N/A 5. Neuropsych/cognition: This patient is capable of making decisions on her own behalf. 6. Skin/Wound Care: Routine skin checks 7. Fluids/Electrolytes/Nutrition:  8/19-discussed why she's on a heart healthy diet. She's willing to continue 8.  Hypertension.  Toprol-XL 50 mg daily.  Monitor with increased mobility             -watch for excessive bradycardia Vitals:   09/08/21 1941 09/09/21 0329  BP: 129/61 138/66  Pulse: (!) 58 60  Resp: 17 16  Temp: 98.8 F (37.1 C) 98.4 F (36.9 C)  SpO2: 100% 100%  BP controlled 8/21  9.  Hypothyroidism.   Synthroid 10.  Hyperlipidemia.  Lipitor 11.  Microcytic anemia.  Iron is within normal limits.  Follow-up H&H. 12.  History of discoid lupus. Halobetasol cream BID  Follow-up outpatient. 13.  Tobacco abuse.  Counseling    14.  Constipation Fe supp, add colace  LOS: 7 days A FACE TO FACE EVALUATION WAS PERFORMED  9/21 09/09/2021, 7:49 AM

## 2021-09-09 NOTE — Progress Notes (Signed)
Physical Therapy Session Note  Patient Details  Name: Susan Farrell MRN: [id number] Date of Birth: [birth year]  Today's Date: 09/09/2021 PT Individual Time: 1000-1055 PT Individual Time Calculation (min): 55 min   Short Term Goals: Week 1:  PT Short Term Goal 1 (Week 1): Pt will perform bed mobility with mod-i. PT Short Term Goal 2 (Week 1): Pt will perform sit to stand transfers with CGA and LRAD. PT Short Term Goal 3 (Week 1): Pt will perform stand pivot transfers with min assist and LRAD consistantly. PT Short Term Goal 4 (Week 1): Pt will ambulate 100 ft with min A and LRAD.  Skilled Therapeutic Interventions/Progress Updates:   Received pt sitting EOB, pt agreeable to PT treatment, and denied any pain during session but reported fatigue from previous OT session. L AFO and shoe cap donned during session. Session with emphasis on functional mobility/transfers, generalized strengthening and endurance, dynamic standing balance/coordination, NMR, and gait training. Pt performed all transfers with RW and CGA throughout session. Pt ambulated 11ft with RW and CGA towards dayroom - limited by fatigue therefore transported remainder of way to dayroom dependently. Pt transferred on/off Nustep with RW and performed seated BUE/LE strengthening on Nustep at workload 6 increasing to 7 for 10 minutes for a total of 314 steps with emphasis on cardiovascular endurance and reciprocal movement training. LLE slipping off footplate, but pt able to adjust using BUEs. Ambulated to mat with RW and CGA and performed sit<>stands with RLE supported on 6in step 1x8 reps to promote weight bearing through LLE and strengthen quads - pt reported already doing this with OT therefore transitioned to mini squats on Airex 3x10 reps with CGA for balance. Then worked on alternating toe taps to 3 cones; however pt unable to lift L hip high enough therefore switched out cones for beanbags and pt performed 2x5 reps bilaterally -  emphasis on L hip flexor strength and coordination - pt reported 8/10 fatigue afterwards. Pt then performed TUG with RW and CGA with average of 28.3 seconds. Pt educated on test results and significance indicating high fall risk as well as importance of using RW upon discharge - pt verbalized understanding.  Trial 1: 29 seconds Trial 2: 29 seconds  Trial 3: 27 seconds  Pt declined ambulating back to room due to fatigue and was transported back dependently. Pt requested to return to bed and concluded session with pt sitting EOB with all needs within reach.   Therapy Documentation Precautions:  Precautions Precautions: Fall Precaution Comments: L hemi Restrictions Weight Bearing Restrictions: No   Therapy/Group: Individual Therapy Itzamara Casas PT, DPT  09/09/2021, 7:04 AM

## 2021-09-10 NOTE — Progress Notes (Signed)
Occupational Therapy Session Note  Patient Details  Name: Susan Farrell MRN: [id number] Date of Birth: 12-08-1945  Today's Date: 09/10/2021 OT Individual Time: 1121-1208 OT Individual Time Calculation (min): 47 min    Short Term Goals: Week 1:  OT Short Term Goal 1 (Week 1): Patient will complete transfer to toilet with Min A OT Short Term Goal 2 (Week 1): Patient will recall hemi dressing techniques with min cues OT Short Term Goal 3 (Week 1): Patient will  with CGA to pull pants up over hips.  Skilled Therapeutic Interventions/Progress Updates:  Pt awake seated at EOB upon OT arrival to the room. Pt reports, "I could go home earlier." Pt in agreement for OT session.  Therapy Documentation Precautions:  Precautions Precautions: Fall Precaution Comments: L hemi Restrictions Weight Bearing Restrictions: No Vital Signs: Please see "Flowsheet" for most recent vitals charted by nursing staff.  Pain: Pain Assessment Pain Scale: 0-10 Pain Score: 0-No pain  ADL: Pt declines need to perform ADL's at this time.   Functional Mobility: Pt participates in functional ambulation in order to improve functional endurance and mobility skills needed for safe DC home. Pt able to perform varius sit <> stand transfers from w/c and EOB with close supervision. Pt able to ambulate from room <> therapy gym (>100') with CGA, use of FWW, and close w/c follow due to decreased endurance. However, pt demo's no LOB during ambulation.   Standing Balance: Pt participates in therapeutic activity using the BITS while standing to challenge standing balance while reaching outside of BOS and crossing midline. These skills are needed to safely perform standing with ADLs and IADLs within the home environment. Pt able to maintain standing balance using the BITS to cross midline and reach outside BOS for 2 minutes x 2 trials with close supervision. Pt requires seated rest breaks between trials, however, reports no  fatigue after these trials.   Visual Perceptual Therapeutic Activity: (BITS) Pt participates in a visual perceptual and multi-step command following activity using the BITS while standing to challenge standing balance. Pt participates in the user paced visual scanning activity with central fixation in order to add a multi-step sequencing task and challenge divided attention. Pt encouraged to use RUE and then LUE to locate and touch the moving object in order to improve LUE motor control and crossing midline as well as demo to pt higher level challenges that pt experiences with LUE motor control. Pt requires minimal VC's for proper sequencing and attending with central fixation. Pt able to maintain standing balance with CGA - close supervision. Please see "balance" section for further details on standing balance. Pt's results form the user paced central fixation activity are listed below. Pt completes 2 minutes x 2 trails with the following results:  Trial 1 (RUE): 92.31% accuracy, 1.08 sec reaction time, 60 hits - Central Fixation Results: 45.45%, 5 attended, 6 unattended Trial 2 (LUE): 53.09% accuracy, 2.76 sec reaction time, 43 hits -Central Fixation Results: 66.67% accuracy, 4 attended, 2 unattended  Pt reports no visual fatigue or s/s of a headache after BITS activity.   LUE Neuro Re-Education: Education provided to pt on motor control deficits with LUE due to significant difference in results with the BITS using RUE vs LUE. OT provided pt with higher level tasks to challenge LUE motor control outside of therapy sessions. OT placed beads in pt's theraputty and provided pt with coins and a coin slot with instructed to perform in-hand manipulation/translate to challenge higher level fine motor control  skills on L hand. Pt verbalizes understanding.    Pt returned to seated at EOB at end of session. Pt left resting comfortably seated at EOB with personal belongings and call light within reach, bed  alarm on and activated, bed in low position, 3 bed rails up, and comfort needs attended to.   Therapy/Group: Individual Therapy  Lavera Guise 09/10/2021, 12:24 PM

## 2021-09-10 NOTE — Progress Notes (Signed)
Occupational Therapy Weekly Progress Note  Patient Details  Name: KITARA HEBB MRN: [id number] Date of Birth: 03-Nov-1945  Beginning of progress report period: September 03, 2021 End of progress report period: September 10, 2021   Patient has met 3 of 3 short term goals.    Patient continues to demonstrate the following deficits: ataxia and decreased coordination and therefore will continue to benefit from skilled OT intervention to enhance overall performance with BADL, iADL, and Reduce care partner burden.  Patient progressing toward long term goals..  Continue plan of care.  OT Short Term Goals Week 1:  OT Short Term Goal 1 (Week 1): Patient will complete transfer to toilet with Min A OT Short Term Goal 1 - Progress (Week 1): Met OT Short Term Goal 2 (Week 1): Patient will recall hemi dressing techniques with min cues OT Short Term Goal 2 - Progress (Week 1): Met OT Short Term Goal 3 (Week 1): Patient will  with CGA to pull pants up over hips. OT Short Term Goal 3 - Progress (Week 1): Met Week 2:  OT Short Term Goal 1 (Week 2): STG = LTG   Javaun Dimperio 09/10/2021, 5:09 PM

## 2021-09-10 NOTE — Progress Notes (Signed)
PROGRESS NOTE   Subjective/Complaints:  Urine smells foul but does not feel she is drinking enough.  No burning or urgency   ROS: Patient denies CP, SOB, N/V/D  Objective:   No results found. No results for input(s): "WBC", "HGB", "HCT", "PLT" in the last 72 hours.  Recent Labs    09/09/21 0727  CREATININE 0.85     Intake/Output Summary (Last 24 hours) at 09/10/2021 0744 Last data filed at 09/10/2021 0720 Gross per 24 hour  Intake 830 ml  Output --  Net 830 ml         Physical Exam: Vital Signs Blood pressure 131/67, pulse 61, temperature 98.5 F (36.9 C), temperature source Oral, resp. rate 16, height 5\' 6"  (1.676 m), weight 72.7 kg, SpO2 97 %.    General: No acute distress Mood and affect are appropriate Heart: Regular rate and rhythm no rubs murmurs or extra sounds Lungs: Clear to auscultation, breathing unlabored, no rales or wheezes Abdomen: Positive bowel sounds, soft nontender to palpation, nondistended Extremities: No clubbing, cyanosis, or edema Skin: No evidence of breakdown, no evidence of rash   Skin: depigmented erythematous plaques forehead neck arms unchanged in appearance Neurologic: Cranial nerves II through XII intact, motor strength is 5/5 in RIght , 4/5 left  deltoid, bicep, tricep, grip, 5/5 R and 3/5 left hip flexor, 4/5 knee extensors, trace ankle dorsiflexor and plantar flexor Sensory exam normal sensation to light touch  in bilateral upper and lower extremities  Finger to thumb opposition slower on left than right side. Good sitting balance Musculoskeletal: Full range of motion in all 4 extremities. No joint swelling   Assessment/Plan: 1. Functional deficits which require 3+ hours per day of interdisciplinary therapy in a comprehensive inpatient rehab setting. Physiatrist is providing close team supervision and 24 hour management of active medical problems listed  below. Physiatrist and rehab team continue to assess barriers to discharge/monitor patient progress toward functional and medical goals  Care Tool:  Bathing    Body parts bathed by patient: Right arm, Left arm, Chest, Abdomen, Front perineal area, Right upper leg, Left upper leg, Face, Buttocks, Right lower leg, Left lower leg   Body parts bathed by helper: Buttocks, Right lower leg, Left lower leg     Bathing assist Assist Level: Contact Guard/Touching assist     Upper Body Dressing/Undressing Upper body dressing   What is the patient wearing?: Pull over shirt, Bra    Upper body assist Assist Level: Set up assist    Lower Body Dressing/Undressing Lower body dressing      What is the patient wearing?: Underwear/pull up, Pants     Lower body assist Assist for lower body dressing: Minimal Assistance - Patient > 75%     Toileting Toileting    Toileting assist Assist for toileting: Contact Guard/Touching assist     Transfers Chair/bed transfer  Transfers assist  Chair/bed transfer activity did not occur: Safety/medical concerns  Chair/bed transfer assist level: Contact Guard/Touching assist     Locomotion Ambulation   Ambulation assist      Assist level: Contact Guard/Touching assist Assistive device: Walker-rolling Max distance: 174ft   Walk 10 feet activity  Assist     Assist level: Contact Guard/Touching assist Assistive device: Walker-rolling   Walk 50 feet activity   Assist Walk 50 feet with 2 turns activity did not occur: Safety/medical concerns  Assist level: Contact Guard/Touching assist Assistive device: Walker-rolling    Walk 150 feet activity   Assist Walk 150 feet activity did not occur: Safety/medical concerns  Assist level: Contact Guard/Touching assist Assistive device: Walker-rolling    Walk 10 feet on uneven surface  activity   Assist Walk 10 feet on uneven surfaces activity did not occur: Safety/medical  concerns   Assist level: Minimal Assistance - Patient > 75% Assistive device: Walker-rolling   Wheelchair     Assist Is the patient using a wheelchair?: Yes (using for energy conservation and time management between therapy gym and room.) Type of Wheelchair: Manual    Wheelchair assist level: Dependent - Patient 0%      Wheelchair 50 feet with 2 turns activity    Assist        Assist Level: Dependent - Patient 0%   Wheelchair 150 feet activity     Assist      Assist Level: Dependent - Patient 0%   Blood pressure 131/67, pulse 61, temperature 98.5 F (36.9 C), temperature source Oral, resp. rate 16, height 5\' 6"  (1.676 m), weight 72.7 kg, SpO2 97 %.  Medical Problem List and Plan: 1. 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  Antithrombotics: -DVT/anticoagulation:  Pharmaceutical: Lovenox             -antiplatelet therapy: Aspirin 81 mg daily and Plavix 75 mg day x3 weeks then aspirin alone starting 09/18/21 3. Pain Management: Tylenol as needed 4. Mood/Behavior/Sleep: Provide emotional support             -antipsychotic agents: N/A 5. Neuropsych/cognition: This patient is capable of making decisions on her own behalf. 6. Skin/Wound Care: Routine skin checks 7. Fluids/Electrolytes/Nutrition:  8/19-discussed why she's on a heart healthy diet. She's willing to continue 8.  Hypertension.  Toprol-XL 50 mg daily.  Monitor with increased mobility             -watch for excessive bradycardia Vitals:   09/09/21 1932 09/10/21 0430  BP: (!) 150/70 131/67  Pulse: (!) 55 61  Resp: 16 16  Temp: 98.4 F (36.9 C) 98.5 F (36.9 C)  SpO2: 100% 97%  BP controlled 8/22  9.  Hypothyroidism.  Synthroid 10.  Hyperlipidemia.  Lipitor 11.   Microcytic anemia.  Iron is within normal limits.  Follow-up H&H. 12.  History of discoid lupus. Halobetasol cream BID  Follow-up outpatient. 13.  Tobacco abuse.  Counseling    14.  Constipation Fe supp, add colace 15.  Urine with strong smell, encourage fluids , check UA if dysuria or fever or urgency/freq LOS: 8 days A FACE TO FACE EVALUATION WAS PERFORMED  9/22 09/10/2021, 7:44 AM

## 2021-09-10 NOTE — Progress Notes (Signed)
Physical Therapy Weekly Progress Note  Patient Details  Name: Susan Farrell MRN: [id number] Date of Birth: 1945-07-29  Beginning of progress report period: September 03, 2021 End of progress report period: September 10, 2021   Patient has met 4 of 4 short term goals.  Ms. Balinski is progressing well with all goals evident by improved ability to perform bed mobility, transfers, ambulation, and stairs with decreased assistance since [facility]. She continues to present with deficits in left LE strength and overall balance. She is currently mod-I with bed mobility, supervision with Sit <>stand and stand pivot transfers using RW,  and CGA with ambulation on various surfaces using RW and step navigation using BHR. Pt would benefit from continued CIR level skilled physical therapy to address strength and balance deficits until pt can return home with husband providing 24 hour supervision.   Patient continues to demonstrate the following deficits muscle weakness, decreased cardiorespiratoy endurance, unbalanced muscle activation, and decreased standing balance and decreased balance strategies and therefore will continue to benefit from skilled PT intervention to increase functional independence with mobility.  Patient progressing toward long term goals..  Continue plan of care.  PT Short Term Goals Week 1:  PT Short Term Goal 1 (Week 1): Pt will perform bed mobility with mod-i. PT Short Term Goal 1 - Progress (Week 1): Met PT Short Term Goal 2 (Week 1): Pt will perform sit to stand transfers with CGA and LRAD. PT Short Term Goal 2 - Progress (Week 1): Met PT Short Term Goal 3 (Week 1): Pt will perform stand pivot transfers with min assist and LRAD consistantly. PT Short Term Goal 3 - Progress (Week 1): Met PT Short Term Goal 4 (Week 1): Pt will ambulate 100 ft with min A and LRAD. PT Short Term Goal 4 - Progress (Week 1): Met Week 2:  PT Short Term Goal 1 (Week 2): = to LTGs based on ELOS  Skilled  Therapeutic Interventions/Progress Updates:  Ambulation/gait training;Balance/vestibular training;Discharge planning;DME/adaptive equipment instruction;Functional mobility training;Pain management;Splinting/orthotics;Therapeutic Activities;Psychosocial support;UE/LE Strength taining/ROM;UE/LE Coordination activities;Therapeutic Exercise;Stair training;Patient/family education;Neuromuscular re-education;Functional electrical stimulation;Disease management/prevention;Community reintegration;Skin care/wound management   Therapy Documentation Precautions:  Precautions Precautions: Fall Precaution Comments: L hemi Restrictions Weight Bearing Restrictions: No   Kayleen Memos 09/10/2021, 3:05 PM

## 2021-09-10 NOTE — Progress Notes (Signed)
Physical Therapy Session Note  Patient Details  Name: Susan Farrell MRN: [id number] Date of Birth: [birth year]  Today's Date: 09/10/2021 PT Individual Time: [phone] and 1093-2355 PT Individual Time Calculation (min): 68 min and 32 min  PT Short Term Goals Week 2:  PT Short Term Goal 1 (Week 2): = to LTGs based on ELOS  Skilled Therapeutic Interventions/Progress Updates:    Session 1: Pt received supine in bed and agreeable to therapy session. Pt already wearing L LE [place] Walk-on PLS AFO and shoe toe cap on - pt also still has 2 wedges under L LE heel to decrease knee hyperextension during stance. Supine>sitting EOB mod-I. Sit<>stands using RW with CGA progressed to supervision during session.  Gait training ~144ft to main therapy gym using RW with CGA - continues to have decreased L LE foot clearance during swing phase due to decreased hip/knee flexion activation with some compensatory strategies at the hip to advance LE fully.   Gait training ~174ft, no UE support, with CGA progressed to consistent min assist - continues to have poor L LE foot clearance and decreased R LE step length and foot clearance due to not feeling secure in L stance phase. Repeated 154ft, no UE support, with cuing specifically for heel strike bilaterally and pt demos improved foot clearance and step lengths bilaterally as well as increased gait speed.  Sitting EOM<>prone mod-I.   Prone on mat L LE hamstring curls 3x10reps with active assist 1st set and then no assist on 2nd or 3rd set but only achieving 10-20degrees of flexion with inability to sustain flexed position nor control eccentric descent.   Supine bridging 2x15reps with L LE bias via R LE extended each time - cuing for full hip clearance.  Gait training ~50ft x2 to/from // bars, no UE support, with continued assist and gait deviations as described above.  Standing in // bars with BUE support and CGA/supervision for safety performed:  - attempted  hamstring curls but pt unable to activate them in standing like she can do in prone, therapist providing active assist for the movement - forward/backwards stepping over hockey stick with LLE x8 reps with min assist for performing knee flexion to step backwards  Stair navigation training ascending/descending 4 steps x3 (6" height) using B HR with light min assist/CGA - reciprocal pattern in both directions targeting increased L hip/knee flexion activation during ascent and quad eccentric control on descent - continues to compensate for lack of hamstring strength via flexing hip up and moving hips posteriorly to allow L foot to move up onto next step, cuing and facilitation to decrease compensation.  Removed L LE AFO and shoes to re-assess ankle PF and DF activaiton - pt demonstrates trace (palpable) muscle activation in L anterior tib and in gastroc when positioned into slight dorsiflexion   Able to complete 2x10reps of palpable L ankle PF activation.   Re-donned shoes and AFO - plan for AFO consult tomorrow (Wed) at 1pm.  Gait training ~113ft back to room using RW with CGA - continues to have above gait deviations.  Donned TED hose as pt appears to have some lower extremity edema and has an order for them. At end of session, pt left seated on EOB with needs in reach.  Session 2: Pt received sitting EOB and agreeable to therapy session. Pt already wearing L LE [place] Walk-on AFO. L squat pivot EOB>w/c supervision.  Transported to/from gym in w/c for time management and energy conservation.   Applied L  LE functional electrical stimulation (FES) to hamstring muscles during the following NMR activities using [place] e-stim device set on NMES L with trigger switch to time activation of stimulation during knee flexion of the activities - intensity 61 with visible muscle contraction achieved today - after completion of e-stim, pt denies any negative side effects with skin intact and no adverse side  effects noted.  - In // bars with B UE support:  -- forward reciprocal stepping through agility ladder with focus on increased hamstring activation earlier during swing phase for improved foot clearance  -- backwards walking through agility ladder step-to pattern leading with L LE focusing on activating hamstrings to lift the foot and step it backwards with therapist providing manual facilitation to achieve correct motor plan  -- side stepping through agility ladder with manual facilitation for increased knee flexion to bring foot backwards and up rather than compensating by using hip flexors with posterior weight shift  Transitioned to prone on table to perform hamstring curls 2x8 reps with pt demonstrating ability to achieve ~30-40degrees of active movement against gravity and has improved eccentric control on lowering while using e-stim compared to earlier AM session.  Transported back to room. R squat pivot EOB supervision. Pt left seated on EOB with needs in reach and meal tray set-up.   Therapy Documentation Precautions:  Precautions Precautions: Fall Precaution Comments: L hemi Restrictions Weight Bearing Restrictions: No   Pain:   Session 1: No reports of pain throughout session.  Session 2: Denies pain during session just states normal "tingling" feeling noted during FES.    Therapy/Group: Individual Therapy  Ginny Forth , PT, DPT, NCS, CSRS 09/10/2021, 7:53 AM

## 2021-09-10 NOTE — Progress Notes (Signed)
Physical Therapy Session Note  Patient Details  Name: Susan Farrell MRN: [id number] Date of Birth: [birth year]  Today's Date: 09/10/2021 PT Individual Time: 1040-1105 and 1320-1420  PT Individual Time Calculation (min): 25 min and 60 min   Short Term Goals: Week 2:  PT Short Term Goal 1 (Week 2): = to LTGs based on ELOS  Skilled Therapeutic Interventions/Progress Updates:   Session 1:     Pt received seated EOB and eager to participate in PT.  Transfers from EOB <>RW at beginning and end of session requires supervision with cues for safe hand placement.  Pt amb 179f x 2 from room <>main therapy gym with RW, left PLS AFO and toe cap, and CGA. Pt presented with decreased right heel strike and stride length due to deficits in left LE strength that cause a decrease in stance phase on the left LE. Cues given to initiate heel strike and increase right LE stride length to normalize gait as much as possible.    Dynamic standing exercises for strengthening and stability completed in the // bars all with CGA: - side stepping 3x10 to right and left with bilateral UE support  - retro walking and high knee fwd walking x 4 laps with CGA and cues to increase knee flexion during high knees and increase stride length to engage glute mm during retro walking. Increased compensation noted during left LE motion due to decreased glute, HS, and hip flexor strength. Bilateral UE support.  - fwd step taps on 5 inch step 2x10 with bilateral UE support. Compensation noted with L step tap evident by backward leaning and circumduction to clear left foot.  - NBOS balance x 1 min with no UE support and steady balance  - bending mini squat with UE cone tap (cone on 5 inch step) x 5 each hand and no UE support  - side step tap x 5 bilaterally on 5 inch step with bilateral UE support. Compensation with right side leaning observed when performing left step tap due to deficits in knee and hip flexion.   Pt left sitting  EOB with call Susan in reach and all needs met.   Session 2:  Pt greeted seated on EOB and eager to participate in PT. Session focused on ambulation and sit <> stand transfers on various surfaces throughout hospital elevators, lobby, gift shop, common area, and outside. Wheelchair follow for rest breaks as needed.   Amb from room to elevator ~1036fand stood in elevator with RW and CGA. Cues needed during entrance and exit from elevator to keep RW close to CoM as patient was placing RW too far anteriorly when clearing elevator threshold. Pt then amb ~50 ft outside of elevator before taking a seated rest break. Pt navigated hosptial lobby and gift shop, >150 feet, with RW and CGA and cues for improved right heel strike due to occasional lack of heel strike and step length from left LE weakness resulting in decreased stance phase. Sit <> stand transfer performed from standard chair in lobby as well as chair management to move to table with supervision. Took pt outside to amb >150 ft with RW and CGA and sit <>stand transfer from table bench with no chair arms with supervision. Seated rest break taken before pt Amb >150 feet back to hospital elevators where she rode back [facility].  Seated exercises performed in WC to increase L LE strength  - LAQ  2x10  - seated hip flexion Right 2x10, Left 2x15  - hip add ball squeeze 2x10  - attempted heel raise/ toe raise with no movement achieved, but palpable by SPT and physical therapist.    Pt escorted back to room in [facility] for energy conservation and time management. Pt left seated at EOB with call Susan in reach and needs met.   Therapy Documentation Precautions:  Precautions Precautions: Fall Precaution Comments: L hemi Restrictions Weight Bearing Restrictions: No     Therapy/Group: Individual Therapy  Kayleen Memos, SPT  09/10/2021, 12:29 PM

## 2021-09-10 NOTE — Progress Notes (Signed)
Recreational Therapy Session Note  Patient Details  Name: Susan Farrell MRN: [id number] Date of Birth: 09-03-1945 Today's Date: 09/10/2021  Pain: no c/o Skilled Therapeutic Interventions/Progress Updates: Met with pt at bedside to discuss community reintegration including it purpose and potential goals.  Pt agreeable to participate in an outing later this week per team recommendation.  [place] 09/10/2021, 10:50 AM

## 2021-09-11 NOTE — Progress Notes (Signed)
Physical Therapy Session Note  Patient Details  Name: Susan Farrell MRN: [id number] Date of Birth: [birth year]  Today's Date: 09/11/2021 PT Individual Time: [phone] and [phone] - [phone]  PT Individual Time Calculation (min): 54 min and 54 min   Short Term Goals: Week 1:  PT Short Term Goal 1 (Week 1): Pt will perform bed mobility with mod-i. PT Short Term Goal 1 - Progress (Week 1): Met PT Short Term Goal 2 (Week 1): Pt will perform sit to stand transfers with CGA and LRAD. PT Short Term Goal 2 - Progress (Week 1): Met PT Short Term Goal 3 (Week 1): Pt will perform stand pivot transfers with min assist and LRAD consistantly. PT Short Term Goal 3 - Progress (Week 1): Met PT Short Term Goal 4 (Week 1): Pt will ambulate 100 ft with min A and LRAD. PT Short Term Goal 4 - Progress (Week 1): Met Week 2:  PT Short Term Goal 1 (Week 2): = to LTGs based on ELOS  Skilled Therapeutic Interventions/Progress Updates:  Session 1:  Pt greeted sitting EOB and agreeable to therapy. Pt able to perform sit<>stand transfer from EOB to RW with supervision and ambulated 130 ft from room to ortho gym with CGA and good left foot clearance and right heel strike compared to previous session due to improved postural awareness and left hip flexion strength. Pt able to perform x 2 car transfers (one sedan height and one SUV height) with supervision and RW. Cues for safe hand and foot placement as well as decreased UE involvement in moving left LE due to patient wanting to compensate for decreased strength. Continued cues given during transfers throughout session to decrease UE involvement in left leg movement to initiate improved strength and endurance of left LE.  Amb 150 ft from ortho gym to main therapy gym with CGA and RW. Performed Berg Balance test and 6 Min Walk test in main therapy gym. See below for results.  Pt then amb back to room 150 ft with CGA and RW and sat back on EOB with tray and call bell in reach and  all needs met.     6 Min Walk Test:  Instructed patient to ambulate as quickly and as safely as possible for 6 minutes using LRAD. Patient was allowed to take standing rest breaks without stopping the test, but if the patient required a sitting rest break the clock would be stopped and the test would be over.  Results: 411 feet (125 meters, Avg speed 0.62ms) using a RW with CGA. Results indicate that the patient has reduced endurance with ambulation compared to age matched norms.  Age Matched Norms: 643-69yo M: 572 meters F: 538 meters, 767-79yo M: 527 meters F: 471 meters, 817-89yo M: 417 meters F: 392 meters MDC: 58.21 meters (190.98 feet) or 50 meters (ANPTA Core Set of Outcome Measures for Adults with Neurologic Conditions, 2018   Balance: Patient participated in [place] demonstrates increased fall risk as noted by score of 35/56.  (<36= high risk for falls, close to 100%; 37-45 significant >80%; 46-51 moderate >50%; 52-55 lower >25%). Improved score from 18 at IE.  Standardized Balance Assessment Standardized Balance Assessment: Berg Balance Test Berg Balance Test Sit to Stand: Able to stand  independently using hands Standing Unsupported: Able to stand 2 minutes with supervision Sitting with Back Unsupported but Feet Supported on Floor or Stool: Able to sit safely and securely 2 minutes Stand to Sit: Sits  safely with minimal use of hands Transfers: Able to transfer safely, definite need of hands Standing Unsupported with Eyes Closed: Able to stand 10 seconds with supervision Standing Ubsupported with Feet Together: Able to place feet together independently and stand for 1 minute with supervision From Standing, Reach Forward with Outstretched Arm: Can reach forward >12 cm safely (5") From Standing Position, Pick up Object from Floor: Able to pick up shoe, needs supervision From Standing Position, Turn to Look Behind Over each Shoulder: Turn sideways only but maintains  balance Turn 360 Degrees: Needs assistance while turning Standing Unsupported, Alternately Place Feet on Step/Stool: Able to complete >2 steps/needs minimal assist Standing Unsupported, One Foot in Front: Able to plae foot ahead of the other independently and hold 30 seconds Standing on One Leg: Unable to try or needs assist to prevent fall Total Score: 35  Session 2:  Session focused on education, demonstration, and practice of HEP, edu on donning and doffing new PSL AFO, and resizing of new RW. Pt received sitting EOB and agreeable to therapy. Pt able to don shoes and left AFO with minimal assistance. Sit <> stand from various surfaces to RW performed throughout session with supervision. Pt able to go from standing>sit>supine>sidelying> prone independently on mat table while performing table exercises.   Mat table exercises (all completed bilaterally) included:  - bridges 2x12 with minimal assistance on feet to keep from sliding on table.  - sidelying clam shell 2x12 (decreased motion on L LE due to decrease hip abd strength)  - prone knee flexion 2x5 on L LE with AAROM due to decreased hamstring strength, 2x10 on right LE  - supine march 2x10 with decreased motion and eccentric lowering on L LE due to decreased hip flexor strength  - seated heel raise and toe raise x 10 with minimal motion in L LE, but trace contraction felt in achilles and anterior tibialis tendon.   Transitioned to standing therex at counter. Pt able to perform supine to sit transfer with mod I, done shoes and AFO with min assist and amb 50 ft with RW and CGA.   Standing exercises (with bilateral UE support at counter) included:  - standing march x 10 (decreased L hip flexion due to weakness)  - side stepping x 10 (cues to decrease hip hike and increase hip abd on L LE)  - sit to stand with no UE support x 10  and cues to place L LE further posteriorly before standing  - alternating step taps on yoga block 2x5 on L LE, x  10 on R LE. Pt has difficulty with lifting L LE up to step due to decreased hip flexor and hamstring strength and required tactile, visual, and verbal cues to improve posture due to pt presenting fwd flexed with contralateral hip drop.   Pt amb back to room 150 ft with RW and CGA, no cues for improved gait pattern, and performed stand>sit from RW to EOB with supervision. Doffed shoes and AFO with mod I. Call bell in reach and all needs met.   HEP Access Code: [email or web address] URL: [email or web address] Date: 09/11/2021 Prepared by: Page Spiro  Exercises - Prone Knee Flexion  - 1 x daily - 7 x weekly - 2 sets - 5-10 reps - Supine Bridge  - 1 x daily - 7 x weekly - 2 sets - 12-15 reps - Clamshell  - 1 x daily - 7 x weekly - 2 sets - 12-15 reps - Supine March  - 1  x daily - 7 x weekly - 3 sets - 10 reps - Standing March with Counter Support  - 3 sets - 10 reps - Sit to Stand with Counter Support  - 2 sets - 10 reps - Side Stepping with Counter Support  - 2 sets - 10 reps - Alternating Step Taps with Counter Support  - 2 sets - 10 reps - Seated Heel Raise  - 2 sets - 10-15 reps - Seated Toe Raise  - 2 sets - 10-15 reps   Therapy Documentation Precautions:  Precautions Precautions: Fall Precaution Comments: L hemi Restrictions Weight Bearing Restrictions: No      Therapy/Group: Individual Therapy  Kayleen Memos, SPT 09/11/2021, 10:11 AM

## 2021-09-11 NOTE — Discharge Summary (Signed)
Physician Discharge Summary  Patient ID: Susan Farrell MRN: [id number] DOB/AGE: 1945/08/11 76 y.o.  Admit date: 09/02/2021 Discharge date: 09/13/2021  Discharge Diagnoses:  Principal Problem:   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) DVT prophylaxis Hypertension Hypothyroidism Hyperlipidemia Microcytic anemia History of discoid lupus Tobacco use Constipation  Discharged Condition: Stable  Significant Diagnostic Studies: ECHOCARDIOGRAM COMPLETE BUBBLE STUDY  Result Date: 08/29/2021    ECHOCARDIOGRAM REPORT   Patient Name:   Susan Farrell Date of Exam: 08/29/2021 Medical Rec #:  [id number]        Height:       66.0 in Accession #:    [id number]       Weight:       159.0 lb Date of Birth:  [birth year]        BSA:          1.814 m Patient Age:    75 years         BP:           160/81 mmHg Patient Gender: F                HR:           58 bpm. Exam Location:  [facility] Procedure: 2D Echo, Color Doppler, Cardiac Doppler and Saline Contrast Bubble            Study Indications:     I63.9 Stroke  History:         Patient has no prior history of Echocardiogram examinations.                  Risk Factors:Hypertension, Dyslipidemia and Current Smoker.  Sonographer:     Humphrey Rolls Referring Phys:  [id number] JAN A MANSY Diagnosing Phys: Lorine Bears MD IMPRESSIONS  1. Left ventricular ejection fraction, by estimation, is 60 to 65%. The left ventricle has normal function. The left ventricle has no regional wall motion abnormalities. There is moderate left ventricular hypertrophy. Left ventricular diastolic parameters are consistent with Grade I diastolic dysfunction (impaired relaxation).  2. Right ventricular systolic function is normal. The right ventricular size is normal. There is normal pulmonary artery systolic pressure.  3. Left atrial size was mildly dilated.  4. The mitral valve is normal in structure. Trivial mitral valve regurgitation. No evidence of mitral stenosis.  5. The  aortic valve is normal in structure. Aortic valve regurgitation is not visualized. No aortic stenosis is present.  6. The inferior vena cava is normal in size with greater than 50% respiratory variability, suggesting right atrial pressure of 3 mmHg.  7. Agitated saline contrast bubble study was negative, with no evidence of any interatrial shunt. FINDINGS  Left Ventricle: Left ventricular ejection fraction, by estimation, is 60 to 65%. The left ventricle has normal function. The left ventricle has no regional wall motion abnormalities. The left ventricular internal cavity size was normal in size. There is  moderate left ventricular hypertrophy. Left ventricular diastolic parameters are consistent with Grade I diastolic dysfunction (impaired relaxation). Right Ventricle: The right ventricular size is normal. No increase in right ventricular wall thickness. Right ventricular systolic function is normal. There is normal pulmonary artery systolic pressure. The tricuspid regurgitant velocity is 2.54 m/s, and  with an assumed right atrial pressure of 3 mmHg, the estimated right ventricular systolic pressure is 28.8 mmHg. Left Atrium: Left atrial size was mildly dilated. Right Atrium: Right atrial size was normal in size. Pericardium: There is no evidence of pericardial effusion. Mitral  Valve: The mitral valve is normal in structure. Trivial mitral valve regurgitation. No evidence of mitral valve stenosis. Tricuspid Valve: The tricuspid valve is normal in structure. Tricuspid valve regurgitation is trivial. No evidence of tricuspid stenosis. Aortic Valve: The aortic valve is normal in structure. Aortic valve regurgitation is not visualized. No aortic stenosis is present. Aortic valve mean gradient measures 5.0 mmHg. Aortic valve peak gradient measures 9.4 mmHg. Aortic valve area, by VTI measures 3.14 cm. Pulmonic Valve: The pulmonic valve was normal in structure. Pulmonic valve regurgitation is not visualized. No evidence  of pulmonic stenosis. Aorta: The aortic root is normal in size and structure. Venous: The inferior vena cava is normal in size with greater than 50% respiratory variability, suggesting right atrial pressure of 3 mmHg. IAS/Shunts: No atrial level shunt detected by color flow Doppler. Agitated saline contrast was given intravenously to evaluate for intracardiac shunting. Agitated saline contrast bubble study was negative, with no evidence of any interatrial shunt.  LEFT VENTRICLE PLAX 2D LVIDd:         4.04 cm   Diastology LVIDs:         2.89 cm   LV e' medial:    4.57 cm/s LV PW:         1.39 cm   LV E/e' medial:  15.4 LV IVS:        0.91 cm   LV e' lateral:   5.87 cm/s LVOT diam:     2.30 cm   LV E/e' lateral: 12.0 LV SV:         95 LV SV Index:   52 LVOT Area:     4.15 cm  RIGHT VENTRICLE RV Basal diam:  3.80 cm LEFT ATRIUM             Index        RIGHT ATRIUM           Index LA diam:        3.30 cm 1.82 cm/m   RA Area:     12.60 cm LA Vol (A2C):   48.3 ml 26.63 ml/m  RA Volume:   28.60 ml  15.77 ml/m LA Vol (A4C):   49.7 ml 27.40 ml/m LA Biplane Vol: 52.8 ml 29.11 ml/m  AORTIC VALVE                     PULMONIC VALVE AV Area (Vmax):    2.82 cm      PV Vmax:       0.90 m/s AV Area (Vmean):   2.84 cm      PV Peak grad:  3.2 mmHg AV Area (VTI):     3.14 cm AV Vmax:           153.00 cm/s AV Vmean:          103.000 cm/s AV VTI:            0.302 m AV Peak Grad:      9.4 mmHg AV Mean Grad:      5.0 mmHg LVOT Vmax:         104.00 cm/s LVOT Vmean:        70.500 cm/s LVOT VTI:          0.228 m LVOT/AV VTI ratio: 0.75  AORTA Ao Root diam: 3.40 cm MITRAL VALVE               TRICUSPID VALVE MV Area (PHT): 2.15 cm    TR Peak grad:  25.8 mmHg MV Decel Time: 353 msec    TR Vmax:        254.00 cm/s MV E velocity: 70.30 cm/s MV A velocity: 98.10 cm/s  SHUNTS MV E/A ratio:  0.72        Systemic VTI:  0.23 m                            Systemic Diam: 2.30 cm Lorine Bears MD Electronically signed by Lorine Bears MD  Signature Date/Time: 08/29/2021/3:07:55 PM    Final    MR BRAIN WO CONTRAST  Result Date: 08/29/2021 CLINICAL DATA:  Initial evaluation for acute neuro deficit, stroke suspected. EXAM: MRI HEAD WITHOUT CONTRAST TECHNIQUE: Multiplanar, multiecho pulse sequences of the brain and surrounding structures were obtained without intravenous contrast. COMPARISON:  Prior CT from 08/28/2021. FINDINGS: Brain: Mild age-related cerebral atrophy with chronic small vessel ischemic disease. Remote lacunar infarct involving the anterior right basal ganglia with associated chronic hemosiderin staining noted. 1 cm acute ischemic nonhemorrhagic infarct present at the posterior right basal ganglia (series 5, image 84). No significant regional mass effect. No other evidence for acute or subacute ischemia. Gray-white matter differentiation otherwise maintained. No other acute or chronic intracranial blood products. No mass lesion, midline shift or mass effect. No hydrocephalus or extra-axial fluid collection. Pituitary gland and suprasellar region within normal limits. Vascular: Major intracranial vascular flow voids are maintained. Skull and upper cervical spine: Craniocervical junction within normal limits. Bone marrow signal intensity normal. No scalp soft tissue abnormality. Sinuses/Orbits: Globes orbital soft tissues within normal limits. Paranasal sinuses are largely clear. No significant mastoid effusion. Other: None. IMPRESSION: 1. 1 cm acute ischemic nonhemorrhagic right basal ganglia infarct. 2. Underlying age-related cerebral atrophy with mild chronic small vessel ischemic disease, with additional chronic right basal ganglia lacunar infarct. Electronically Signed   By: Rise Mu M.D.   On: 08/29/2021 03:24   CT ANGIO HEAD NECK W WO CM (CODE STROKE)  Result Date: 08/28/2021 CLINICAL DATA:  Stroke, follow-up. Additional history provided: Patient unable to lift/move left leg. Symptoms began at 1300 today.  Intermittent weakness in left arm. EXAM: CT ANGIOGRAPHY HEAD AND NECK TECHNIQUE: Multidetector CT imaging of the head and neck was performed using the standard protocol during bolus administration of intravenous contrast. Multiplanar CT image reconstructions and MIPs were obtained to evaluate the vascular anatomy. Carotid stenosis measurements (when applicable) are obtained utilizing NASCET criteria, using the distal internal carotid diameter as the denominator. RADIATION DOSE REDUCTION: This exam was performed according to the departmental dose-optimization program which includes automated exposure control, adjustment of the mA and/or kV according to patient size and/or use of iterative reconstruction technique. CONTRAST:  36mL OMNIPAQUE IOHEXOL 350 MG/ML SOLN COMPARISON:  Noncontrast head CT performed earlier today 08/28/2021. FINDINGS: CTA NECK FINDINGS Aortic arch: Common origin of the innominate and left common carotid arteries. Atherosclerotic plaque within the aortic arch. No hemodynamically significant innominate or proximal subclavian artery stenosis. Right carotid system: CCA and ICA patent within the neck without stenosis or significant atherosclerotic disease. Left carotid system: CCA and ICA patent within the neck without stenosis or significant atherosclerotic disease. Vertebral arteries: Vertebral arteries patent within the neck. Mild atherosclerotic narrowing at the origin of the left vertebral artery. Skeleton: Nonspecific reversal of the expected cervical lordosis. Cervical spondylosis. Most notably at C4-C5, a posterior disc osteophyte complex contributes to suspected severe spinal canal stenosis (series 6, image 157). Other neck: 2.7 cm nodule within  the left thyroid lobe. Upper chest: No consolidation within the imaged lung apices. Review of the MIP images confirms the above findings CTA HEAD FINDINGS Anterior circulation: The intracranial internal carotid arteries are patent. Nonstenotic  atherosclerotic plaque within both vessels. The M1 middle cerebral arteries are patent. Atherosclerotic irregularity of the M2 and more distal MCA vessels, bilaterally. No M2 proximal branch occlusion or high-grade proximal stenosis is identified. The anterior cerebral arteries are patent. 1-2 mm ventrally projecting vascular protrusion arising from the cavernous right ICA, suspicious for an aneurysm (series 8, image 97) (series 6, image 260). Posterior circulation: The intracranial vertebral arteries are patent. The basilar artery is patent. The posterior cerebral arteries are patent. Posterior communicating arteries are diminutive or absent, bilaterally. Venous sinuses: Within the limitations of contrast timing, no convincing thrombus. Anatomic variants: As described. Review of the MIP images confirms the above findings No emergent large vessel occlusion identified. These results were called by telephone at the time of interpretation on 08/28/2021 at 10:11 pm to provider Susan Farrell , who verbally acknowledged these results. IMPRESSION: CTA neck: 1. The common carotid, internal carotid and vertebral arteries are patent within the neck. Mild atherosclerotic narrowing at the origin of the left vertebral artery. 2. Aortic Atherosclerosis (ICD10-I70.0). 3. 2.7 cm left thyroid lobe nodule. This nodule was previously evaluated by ultrasound on 02/08/2013, and fine-needle aspiration was recommended at that time. Please refer to this prior report for further details. 4. Cervical spondylosis. Most notably, a posterior disc osteophyte complex contributes to suspected severe spinal canal stenosis at C4-C5. Consider a cervical spine MRI to assess for spinal cord impingement at this level, when clinically appropriate. CTA head: 1. No intracranial large vessel occlusion or proximal high-grade arterial stenosis is identified. 2. 1-2 mm ventrally projecting vascular protrusion arising from the cavernous right ICA, suspicious  for an aneurysm. Electronically Signed   By: Jackey LogeKyle  Golden D.O.   On: 08/28/2021 22:14   DG Chest Portable 1 View  Result Date: 08/28/2021 CLINICAL DATA:  Weakness, tobacco abuse EXAM: PORTABLE CHEST 1 VIEW COMPARISON:  None Available. FINDINGS: Single frontal view of the chest demonstrates mild enlargement of the cardiac silhouette. There is increased central vascular congestion. Subpleural interstitial prominence at the lung bases may reflect Kerley lines related to interstitial edema. No effusion or pneumothorax. No acute airspace disease. IMPRESSION: 1. Findings consistent with mild congestive heart failure and interstitial edema. Electronically Signed   By: Sharlet SalinaMichael  Brown M.D.   On: 08/28/2021 20:52   CT HEAD CODE STROKE WO CONTRAST  Result Date: 08/28/2021 CLINICAL DATA:  Code stroke. Neuro deficit, acute, stroke suspected. Left-sided weakness. Last known well 1 p.m. today. EXAM: CT HEAD WITHOUT CONTRAST TECHNIQUE: Contiguous axial images were obtained from the base of the skull through the vertex without intravenous contrast. RADIATION DOSE REDUCTION: This exam was performed according to the departmental dose-optimization program which includes automated exposure control, adjustment of the mA and/or kV according to patient size and/or use of iterative reconstruction technique. COMPARISON:  None Available. FINDINGS: Brain: Mild generalized cerebral atrophy. Chronic lacunar infarct within the right basal ganglia (for instance as seen on series 3, image 11). Additional small hypodense focus more inferiorly within the right basal ganglia, favored to reflect a prominent perivascular space (for instance as seen on series 5, image 29). Partially empty sella turcica. There is no acute intracranial hemorrhage. No demarcated cortical infarct. No extra-axial fluid collection. No evidence of an intracranial mass. No midline shift. Vascular: No hyperdense vessel. Atherosclerotic calcifications.  Skull: No fracture or  aggressive osseous lesion. Sinuses/Orbits: No mass or acute finding within the imaged orbits. No significant paranasal sinus disease at the imaged levels. ASPECTS ([place] Stroke Program Early CT Score) - Ganglionic level infarction (caudate, lentiform nuclei, internal capsule, insula, M1-M3 cortex): 7 - Supraganglionic infarction (M4-M6 cortex): 3 Total score (0-10 with 10 being normal): 10 (when discounting the chronic right basal ganglia lacunar infarct). No evidence of acute intracranial abnormality. These results were called by telephone at the time of interpretation on 08/28/2021 at 8:16 pm to provider Dr. Roxan Hockey, Who verbally acknowledged these results. IMPRESSION: No evidence of acute intracranial abnormality. ASPECTS is 10. Chronic right basal ganglia lacunar infarct. Mild generalized cerebral atrophy. Electronically Signed   By: Jackey Loge D.O.   On: 08/28/2021 20:17    Labs:  Basic Metabolic Panel: Recent Labs  Lab 09/09/21 0727  CREATININE 0.85    CBC: No results for input(s): "WBC", "NEUTROABS", "HGB", "HCT", "MCV", "PLT" in the last 168 hours.  CBG: No results for input(s): "GLUCAP" in the last 168 hours.  Family history.  Positive for hypertension as well as hyperlipidemia.  Denies any colon cancer esophageal cancer or rectal cancer  Brief HPI:   ADELYNNE JOERGER is a 76 y.o. right-handed female with history of discoid lupus, hypertension, hyperlipidemia, hypothyroidism, abdominal aortic aneurysm with repair tobacco use.  Per chart review lives with spouse independent prior to admission.  Presented to [facility] 08/28/2021 with acute onset of left-sided weakness.  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  Underlying age-related cerebral atrophy with mild chronic small vessel changes.  CT angiogram head and neck showed internal carotid and vertebral arteries to be patent.  No intracranial large vessel occlusion or stenosis.  Admission chemistries  unremarkable except glucose 112 hemoglobin A1c 5.7.  Echocardiogram with ejection fraction of 60 to 65% no wall motion abnormalities grade 1 diastolic dysfunction.  Maintained on low-dose aspirin as well as Plavix x3 weeks then aspirin alone.  Subcutaneous Lovenox for DVT prophylaxis.  She did sustain a fall 08/30/2021 when attempting to get out of bed without assistance and no injury sustained.  Therapy evaluations completed due to patient decreased functional mobility was admitted for a comprehensive rehab program.   Hospital Course: BESSE MIRON was admitted to rehab 09/02/2021 for inpatient therapies to consist of PT, ST and OT at least three hours five days a week. Past admission physiatrist, therapy team and rehab RN have worked together to provide customized collaborative inpatient rehab..  Pertaining to patient's right basal ganglia infarction remains stable she will continue on aspirin and Plavix up to 3 weeks then aspirin alone starting 09/18/2021 with follow-up per neurology services.  Blood pressure remained controlled on Toprol follow-up outpatient.  Synthroid ongoing for hypothyroidism.  Maintained on Lipitor for hyperlipidemia.  Microcytic anemia iron within normal limits follow-up hemoglobin as outpatient.  History of tobacco abuse she did receive counts regards to cessation of nicotine products.   Blood pressures were monitored on TID basis and controlled      Rehab course: During patient's stay in rehab weekly team conferences were held to monitor patient's progress, set goals and discuss barriers to discharge. At admission, patient required moderate assist 10 feet rolling walker minimal assist sit to stand  Physical exam.  Blood pressure 143/75 pulse 54 temperature 97.8 respirations 18 oxygen saturations 99% room air Constitutional.  No acute distress HEENT Head.  Normocephalic and atraumatic Eyes.  Pupils round and reactive  to light no discharge without nystagmus Neck.  Supple  nontender no JVD without thyromegaly Cardiac regular rate and rhythm without any extra sounds or murmur heard Abdomen.  Soft nontender positive bowel sounds without rebound Respiratory effort normal no respiratory distress without wheeze Skin.  Intact Neurologic.  Alert oriented provides name and age fair insight and awareness.  Left upper extremity 4/5 proximal to distal.  Left lower extremity 2 -/5 hip extension knee extension APF and 0/5 ADF.  Right upper right lower extremity 4+/5 proximal distal neurosensory focal deficits.  He/She  has had improvement in activity tolerance, balance, postural control as well as ability to compensate for deficits. He/She has had improvement in functional use RUE/LUE  and RLE/LLE as well as improvement in awareness.  Modified independent bed mobility supervision sit to stand ambulates contact-guard assist.  His belongings for activities and living and homemaking.  It was discussed no driving.  Full family teaching completed plan discharge to home       Disposition: Discharge to home    Diet: Regular  Special Instructions: No driving smoking or alcohol  Medications at discharge. 1.  Tylenol as needed 2.  Aspirin 81 mg p.o. daily 3.  Os-Cal 500 mg p.o. daily 4.  Plavix 75 mg daily through 09/18/2021 and stop 5.  Colace 100 mg p.o. daily 6.  Niferex 150 mg p.o. daily 7.  Synthroid 100 mcg p.o. daily 8.  Toprol-XL 50 mg p.o. daily 9.  Multivitamin daily 10.  Crestor 40 mg p.o. daily  30-35 minutes were spent completing discharge summary and discharge planning  Discharge Instructions     Ambulatory referral to Neurology   Complete by: As directed    An appointment is requested in approximately: follow up 4 weeks right basal ganglia infarction   Ambulatory referral to Physical Medicine Rehab   Complete by: As directed    Moderate complexity follow-up 1 to 2 weeks right basal ganglia infarction        Follow-up Information     Kirsteins,  Victorino Sparrow, MD Follow up.   Specialty: Physical Medicine and Rehabilitation Why: office to call for appointment Contact information: [address] [place] [address] [phone]                 Signed: Charlton Amor 09/12/2021, 5:23 AM

## 2021-09-11 NOTE — Progress Notes (Signed)
Physical Therapy Session Note  Patient Details  Name: Susan Farrell MRN: [id number] Date of Birth: 09-Jan-1946  Today's Date: 09/11/2021 PT Individual Time: 1307-1401 PT Individual Time Calculation (min): 54 min   Short Term Goals: Week 2:  PT Short Term Goal 1 (Week 2): = to LTGs based on ELOS  Skilled Therapeutic Interventions/Progress Updates:    Pt received sitting EOB and agreeable to therapy session with plan for AFO consultation. Thayer Ohm, [facility] present for consultation. Pt already wearing L LE [place] Walk-on PLS AFO with shoe toe-cap cover and 2 heel wedges underneath AFO.   Sit<>stands using RW with supervision - continued cuing to bring L LE back underneath her BOS prior to initiating coming to stand, without compensating by using her UEs.   Gait training ~132ft to main therapy gym using RW with close supervision for safety - continues to have decreased L LE hip/knee flexion for full foot clearance during swing with slight pelvic rotation compensation.   Removed the heel wedges from underneath AFO to assess change. Gait training ~73ft using RW with continued close supervision for safety and no significant gait change compared to when using heel wedges; however, removed AD to assess with min assist from therapist for balance and pt immediately has onset of L knee snapping back into hyperextension during stance phase.  Placed heel wedges back underneath AFO and performed ~14ft gait training, no UE support, with lighter min assist for balance and pt demonstrates only 1x instance of L knee hyperextension and has improved step lengths bilaterally with heel strike and increased gait speed.  Determined that pt would benefit most from: large L LE [place] Walk-on PLS AFO with the foot plate cut shorter to allow the posterior strut to be closer to the back of her leg to allow increased posterior lower leg/knee support in addition to softer heel wedge as well as shoe toe-cap.   Applied L LE  functional electrical stimulation (FES) to hamstring and anterior tibialis muscles during the following NMR tasks using [place] e-stim device set on NMES L with trigger switch to time activation of both stimulations during swing phase of gait - intensity 51 on hamstrings and 52 on anterior tibialis with visible muscle contractions and active movement - after completion of e-stim, pt denies any negative side effects with skin intact and no adverse side effects noted.  - forward gait training in // bars with B UE support and close supervision for safety x4 laps - backwards walking in // bars with B UE support and close supervision with focus on hamstring activation to move LE posteriorly as opposed to pelvic hip hiking to compensate - L LE foot taps on/off 4" step with B UE support on // bars with close supervision and cuing to tap heel then toes when placing foot up onto step followed by taping toes then heel when moving foot backwards onto the ground to address ankle DF movement  Pt demonstrates improved ability to perform hip/knee flexion and ankle DF while performing the above NMR tasks with use of FES.   Pt transported back to room in w/c due to not having shoes and AFO.  At end of session, pt left sitting EOB with needs in reach.   Therapy Documentation Precautions:  Precautions Precautions: Fall Precaution Comments: L hemi Restrictions Weight Bearing Restrictions: No   Pain:  Reports expected "discomfort" with use of FES but denies any pain and no symptoms remaining after removal of e-stim.   Therapy/Group: Individual Therapy  Ginny Forth , PT, DPT, NCS, CSRS 09/11/2021, 2:03 PM

## 2021-09-11 NOTE — Progress Notes (Signed)
                                                       PROGRESS NOTE   Subjective/Complaints:  Pt states she'd like to go home prior to 8/29 ROS: Patient denies CP, SOB, N/V/D  Objective:   No results found. No results for input(s): "WBC", "HGB", "HCT", "PLT" in the last 72 hours.  Recent Labs    09/09/21 0727  CREATININE 0.85      Intake/Output Summary (Last 24 hours) at 09/11/2021 0809 Last data filed at 09/11/2021 0733 Gross per 24 hour  Intake 592 ml  Output --  Net 592 ml         Physical Exam: Vital Signs Blood pressure 123/73, pulse (!) 57, temperature 98.5 F (36.9 C), temperature source Oral, resp. rate 18, height 5' 6" (1.676 m), weight 72.7 kg, SpO2 100 %.    General: No acute distress Mood and affect are appropriate Heart: Regular rate and rhythm no rubs murmurs or extra sounds Lungs: Clear to auscultation, breathing unlabored, no rales or wheezes Abdomen: Positive bowel sounds, soft nontender to palpation, nondistended Extremities: No clubbing, cyanosis, or edema Skin: No evidence of breakdown, no evidence of rash   Skin: depigmented erythematous plaques forehead neck arms unchanged in appearance Neurologic: Cranial nerves II through XII intact, motor strength is 5/5 in RIght , 4/5 left  deltoid, bicep, tricep, grip, 5/5 R and 3/5 left hip flexor, 4/5 knee extensors, trace ankle dorsiflexor and plantar flexor Sensory exam normal sensation to light touch  in bilateral upper and lower extremities  Finger to thumb opposition slower on left than right side. Good sitting balance Musculoskeletal: Full range of motion in all 4 extremities. No joint swelling   Assessment/Plan: 1. Functional deficits which require 3+ hours per day of interdisciplinary therapy in a comprehensive inpatient rehab setting. Physiatrist is providing close team supervision and 24 hour management of active medical problems listed below. Physiatrist and rehab team continue to  assess barriers to discharge/monitor patient progress toward functional and medical goals  Care Tool:  Bathing    Body parts bathed by patient: Right arm, Left arm, Chest, Abdomen, Front perineal area, Right upper leg, Left upper leg, Face, Buttocks, Right lower leg, Left lower leg   Body parts bathed by helper: Buttocks, Right lower leg, Left lower leg     Bathing assist Assist Level: Contact Guard/Touching assist     Upper Body Dressing/Undressing Upper body dressing   What is the patient wearing?: Pull over shirt, Bra    Upper body assist Assist Level: Set up assist    Lower Body Dressing/Undressing Lower body dressing      What is the patient wearing?: Underwear/pull up, Pants     Lower body assist Assist for lower body dressing: Minimal Assistance - Patient > 75%     Toileting Toileting    Toileting assist Assist for toileting: Contact Guard/Touching assist     Transfers Chair/bed transfer  Transfers assist  Chair/bed transfer activity did not occur: Safety/medical concerns  Chair/bed transfer assist level: Contact Guard/Touching assist     Locomotion Ambulation   Ambulation assist      Assist level: Contact Guard/Touching assist Assistive device: Walker-rolling Max distance: 150ft   Walk 10 feet activity   Assist     Assist   level: Contact Guard/Touching assist Assistive device: Walker-rolling   Walk 50 feet activity   Assist Walk 50 feet with 2 turns activity did not occur: Safety/medical concerns  Assist level: Contact Guard/Touching assist Assistive device: Walker-rolling    Walk 150 feet activity   Assist Walk 150 feet activity did not occur: Safety/medical concerns  Assist level: Contact Guard/Touching assist Assistive device: Walker-rolling    Walk 10 feet on uneven surface  activity   Assist Walk 10 feet on uneven surfaces activity did not occur: Safety/medical concerns   Assist level: Minimal Assistance - Patient  > 75% Assistive device: Walker-rolling   Wheelchair     Assist Is the patient using a wheelchair?: Yes (using for energy conservation and time management between therapy gym and room.) Type of Wheelchair: Manual    Wheelchair assist level: Dependent - Patient 0%      Wheelchair 50 feet with 2 turns activity    Assist        Assist Level: Dependent - Patient 0%   Wheelchair 150 feet activity     Assist      Assist Level: Dependent - Patient 0%   Blood pressure 123/73, pulse (!) 57, temperature 98.5 F (36.9 C), temperature source Oral, resp. rate 18, height 5' 6" (1.676 m), weight 72.7 kg, SpO2 100 %.  Medical Problem List and Plan: 1. 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 Formulized individual treatment plan based on medical history, underlying problem and comorbidities.              -PRAFO LLE for wear at night  -Continue CIR therapies including PT, OT  2.  Antithrombotics: -DVT/anticoagulation:  Pharmaceutical: Lovenox             -antiplatelet therapy: Aspirin 81 mg daily and Plavix 75 mg day x3 weeks then aspirin alone starting 09/18/21 3. Pain Management: Tylenol as needed 4. Mood/Behavior/Sleep: Provide emotional support             -antipsychotic agents: N/A 5. Neuropsych/cognition: This patient is capable of making decisions on her own behalf. 6. Skin/Wound Care: Routine skin checks 7. Fluids/Electrolytes/Nutrition:  8/19-discussed why she's on a heart healthy diet. She's willing to continue 8.  Hypertension.  Toprol-XL 50 mg daily.  Monitor with increased mobility             -watch for excessive bradycardia Vitals:   09/10/21 1945 09/11/21 0515  BP: (!) 142/72 123/73  Pulse:  (!) 57 (!) 57  Resp: 17 18  Temp: 98.2 F (36.8 C) 98.5 F (36.9 C)  SpO2: 100% 100%  BP controlled 8/23  9.  Hypothyroidism.  Synthroid 10.  Hyperlipidemia.  Lipitor 11.  Microcytic anemia.  Iron is within normal limits.  Follow-up H&H. 12.  History of discoid lupus. Halobetasol cream BID  Follow-up outpatient. 13.  Tobacco abuse.  Counseling    14.  Constipation Fe supp, add colace 15.  Urine with strong smell, encourage fluids , check UA if dysuria or fever or urgency/freq LOS: 9 days A FACE TO FACE EVALUATION WAS PERFORMED  Andrew E Kirsteins 09/11/2021, 8:09 AM    

## 2021-09-11 NOTE — Progress Notes (Signed)
Occupational Therapy Session Note  Patient Details  Name: Susan Farrell MRN: [id number] Date of Birth: [birth year]  Today's Date: 09/11/2021 OT Individual Time: 1505-1530 OT Individual Time Calculation (min): 25 min    Short Term Goals: Week 2:  OT Short Term Goal 1 (Week 2): STG = LTG  Skilled Therapeutic Interventions/Progress Updates:  Skilled OT intervention completed with focus on scapular and core strengthening needed for shoulder integrity during functional ambulation with RW. Pt received seated EOB, no c/o pain, declined self-care needs. Pt completed the following exercises:  (With red theraband) 2x12 reps Therapist anchored scapular retraction, each arm Self-anchored diagonal shoulder pulls, each arm Shoulder external rotation (Un-weighted) Modified sit ups x12, with compensatory leg lift noted with cues needed to activate core Sustained shoulder external rotation with russian twist x10  Pt remained seated EOB with all needs in reach at end of session.   Therapy Documentation Precautions:  Precautions Precautions: Fall Precaution Comments: L hemi Restrictions Weight Bearing Restrictions: No    Therapy/Group: Individual Therapy  Melvyn Novas, MS, OTR/L  09/11/2021, 3:42 PM

## 2021-09-11 NOTE — Patient Care Conference (Signed)
Inpatient RehabilitationTeam Conference and Plan of Care Update Date: 09/11/2021   Time: 10:33 AM    Patient Name: Susan Farrell      Medical Record Number: [id number]  Date of Birth: 09-May-1945 Sex: Female         Room/Bed: 4M02C/4M02C-01 Payor Info: Payor: [employer] MEDICARE / Plan: [employer] MEDICARE HMO / Product Type: *No Product type* /    Admit Date/Time:  09/02/2021  1:08 PM  Primary Diagnosis:  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 [facility])  Hospital Problems: Principal Problem:   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 [facility])    Expected Discharge Date: Expected Discharge Date: 09/13/21  Team Members Present: Physician leading conference: Dr. Claudette Laws Social Worker Present: Cecile Sheerer, LCSWA Nurse Present: Chana Bode, RN PT Present: Casimiro Needle, PT OT Present: Roney Mans, OT SLP Present: Eilene Ghazi, SLP PPS Coordinator present : Fae Pippin, SLP     Current Status/Progress Goal Weekly Team Focus  Bowel/Bladder   continent of b/b; LBM: 8/22  remain continent of b/b  assist with toileting needs prn   Swallow/Nutrition/ Hydration             ADL's   CGA - set up for ADLs - CGA for transfers  modified independence  dynamic standing balance, functional mobility, family ed, DME, and ADL training as needed   Mobility   mod I for bed mobility, supervision for transfers, CGA for ambulation both on even and uneven surfaces, CGA/min assist for stair negotiation using BHR.  using RW and PSL AFO and toe cap on L LE.  supervision overall at ambulatory level  FES to L hamstrings for activation, gait training on all surfaces using LRAD, community activity, dynamic standing balance and ambulation, stair navigation, AFO consult scheduled for 8/23.   Communication             Safety/Cognition/ Behavioral Observations            Pain   no c/o pain  remain pain free  assess pain QS and prn   Skin   skin intact  maintain skin  integrity  assess QS and prn     Discharge Planning:  Patient plans to discharge home with assistance fro spouse and sisters. Able to provide 24/7 supervision. Fam eduThursday (8/24) 1pm-4pm with her husband. DME ordered-RW and outaptient referral sent to [facility].   Team Discussion: Patient doing well overall.  Patient on target to meet rehab goals: yes, currently needs CGA for ADLs and transfers. Needs mod I assist for bed mobility and transfers to the wc. Completes Sit - stand with supervision and stand pivots with supervision. Ambulates using a RW with CGA- supervision. Complete steps with CGA.  Goals for discharge set for Mod I overall and supervision for transfers.   *See Care Plan and progress notes for long and short-term goals.   Revisions to Treatment Plan:  AFO consult   Teaching Needs: Safety, transfers, medications, dietary modifications, etc  Current Barriers to Discharge: Decreased caregiver support  Possible Resolutions to Barriers: Family education OP follow up services DME: RW, [facility]     Medical Summary Current Status: Left lower extremity weakness improving, blood pressures are stable, patient hoping for reduced length of stay  Barriers to Discharge: Medical stability   Possible Resolutions to Barriers/Weekly Focus: Orthotic evaluation today, discuss functional progress with therapy today   Continued Need for Acute Rehabilitation Level of Care: The patient requires daily medical management by a physician with specialized  training in physical medicine and rehabilitation for the following reasons: Direction of a multidisciplinary physical rehabilitation program to maximize functional independence : Yes Medical management of patient stability for increased activity during participation in an intensive rehabilitation regime.: Yes Analysis of laboratory values and/or radiology reports with any subsequent need for medication adjustment and/or medical  intervention. : Yes   I attest that I was present, lead the team conference, and concur with the assessment and plan of the team.   Chana Bode B 09/11/2021, 4:54 PM

## 2021-09-11 NOTE — Progress Notes (Signed)
Patient ID: Susan Farrell, female   DOB: 02-May-1945, 76 y.o.   MRN: [id number]  SW spoke with pt Husband Susan Farrell to provide updates from team conference and d/c date now 8/25.  Confirms he will be here for family edu tomorrow.   SW ordered 3in1 BSC with [employer] via parachute.  *SW met with in room to discuss above. DME delivered to her room. SW provided her with handicap placard.   Loralee Pacas, MSW, [place] Office: [phone] Cell: [phone] Fax: [phone]

## 2021-09-12 MED ORDER — NIFEREX PO TABS: 1.0000 | ORAL_TABLET | Freq: Every day | ORAL | 0 refills | Status: DC

## 2021-09-12 MED ORDER — ROSUVASTATIN CALCIUM 40 MG PO TABS: 40.0000 mg | ORAL_TABLET | Freq: Every day | ORAL | 0 refills | Status: DC

## 2021-09-12 MED ORDER — CLOPIDOGREL BISULFATE 75 MG PO TABS: 75.0000 mg | ORAL_TABLET | Freq: Every day | ORAL | 0 refills | Status: AC

## 2021-09-12 MED ORDER — ACETAMINOPHEN 325 MG PO TABS
650.0000 mg | ORAL_TABLET | Freq: Four times a day (QID) | ORAL | Status: DC | PRN
Start: 2021-09-12 — End: 2021-11-14

## 2021-09-12 MED ORDER — CALCIUM CARBONATE-VITAMIN D 600-200 MG-UNIT PO TABS: 1.0000 | ORAL_TABLET | Freq: Every day | ORAL | 0 refills | Status: AC

## 2021-09-12 MED ORDER — SYNTHROID 100 MCG PO TABS: 100.0000 ug | ORAL_TABLET | Freq: Every day | ORAL | 0 refills | Status: DC

## 2021-09-12 MED ORDER — METOPROLOL SUCCINATE ER 50 MG PO TB24
50.0000 mg | ORAL_TABLET | Freq: Every day | ORAL | 0 refills | Status: DC
Start: 2021-09-12 — End: 2022-09-24

## 2021-09-12 NOTE — Progress Notes (Signed)
Occupational Therapy Discharge Summary  Patient Details  Name: Susan Farrell MRN: [id number] Date of Birth: [birth year]  Date of Discharge from [place] 25, 2023  Today's Date: 09/12/2021    Patient has met 11 of 11 long term goals due to improved activity tolerance, improved balance, postural control, ability to compensate for deficits, and improved coordination.  Pt has made excellent progress during LOS d/t improved LUE FMC, balance, coordination, strength, and endurance. Pt currently requires supervision for ambulatory ADL transfers with RW and supervision - MOD I for ADLs with RW. Pt continues to present with LLE hemiparesis however greatly improved from eval. Pts husband was present for family ed demos competency with assisting pt with ADLs and functional mobility. Patient to discharge at overall Supervision level.  Patient's care partner is independent to provide the necessary physical assistance at discharge.    Reasons goals not met: NA  Recommendation:  Patient will benefit from ongoing skilled OT services in outpatient setting to continue to advance functional skills in the area of BADL and Reduce care partner burden.  Equipment: BSC  Reasons for discharge: treatment goals met and discharge from hospital  Patient/family agrees with progress made and goals achieved: Yes  OT Discharge Precautions/Restrictions  Precautions Precautions: Fall Restrictions Weight Bearing Restrictions: No  ADL ADL Eating: Independent Where Assessed-Eating: Wheelchair Grooming: Modified independent Where Assessed-Grooming: Sitting at sink Upper Body Bathing: Supervision/safety Where Assessed-Upper Body Bathing: Shower Lower Body Bathing: Supervision/safety Where Assessed-Lower Body Bathing: Shower Upper Body Dressing: Modified independent (Device) Where Assessed-Upper Body Dressing: Wheelchair Lower Body Dressing: Supervision/safety Where Assessed-Lower Body Dressing:  Wheelchair (sit>stand) Toileting: Supervision/safety Where Assessed-Toileting: Risk analyst Method: Ambulating, Other (comment) (RW) Tub/Shower Equipment: Walk in shower, Shower seat with back Social research officer, government: Close supervision Social research officer, government Method: Heritage manager: Civil engineer, contracting with back ADL Comments: pt completed ADLs with RW and supervision- MODI, pts biggest deficit during ADLs in balance and advancing LLE during functional ambulation Vision Baseline Vision/History: 0 No visual deficits Patient Visual Report: No change from baseline Vision Assessment?: No apparent visual deficits Perception  Perception: Within Functional Limits Praxis Praxis: Intact Cognition Cognition Overall Cognitive Status: Within Functional Limits for tasks assessed Arousal/Alertness: Awake/alert Memory: Appears intact Attention: Focused;Sustained;Selective Focused Attention: Appears intact Sustained Attention: Appears intact Selective Attention: Appears intact Awareness: Appears intact Problem Solving: Appears intact Safety/Judgment: Appears intact Brief Interview for Mental Status (BIMS) Repetition of Three Words (First Attempt): 3 Temporal Orientation: Year: Correct Temporal Orientation: Month: Accurate within 5 days Temporal Orientation: Day: Correct Recall: "Sock": Yes, no cue required Recall: "Blue": Yes, no cue required Recall: "Bed": Yes, no cue required BIMS Summary Score: 15 Sensation Sensation Light Touch: Appears Intact Hot/Cold: Appears Intact Proprioception: Impaired by gross assessment Stereognosis: Not tested Coordination Gross Motor Movements are Fluid and Coordinated: No Fine Motor Movements are Fluid and Coordinated: Yes Motor  Motor Motor: Hemiplegia Motor - Skilled Clinical Observations: L hemi (LE>UE) Motor - Discharge Observations: L hemi ( LLE>LUE) improved from eval Mobility  Bed  Mobility Bed Mobility: Supine to Sit;Sit to Supine Supine to Sit: Independent Sit to Supine: Independent Transfers Sit to Stand: Independent with assistive device (RW) Stand to Sit: Independent with assistive device (RW)  Trunk/Postural Assessment  Cervical Assessment Cervical Assessment: Exceptions to [facility] (slightly forward head) Thoracic Assessment Thoracic Assessment: Within Functional Limits Lumbar Assessment Lumbar Assessment: Within Functional Limits Postural Control Postural Control: Deficits on evaluation Righting Reactions: continues to present with impaired balance however  righting reactions improved from eval  Balance Balance Balance Assessed: Yes Standardized Balance Assessment Standardized Balance Assessment: Berg Balance Test Berg Balance Test Sit to Stand: Able to stand  independently using hands Standing Unsupported: Able to stand 2 minutes with supervision Sitting with Back Unsupported but Feet Supported on Floor or Stool: Able to sit safely and securely 2 minutes Stand to Sit: Sits safely with minimal use of hands Transfers: Able to transfer safely, definite need of hands Standing Unsupported with Eyes Closed: Able to stand 10 seconds with supervision Standing Ubsupported with Feet Together: Able to place feet together independently and stand for 1 minute with supervision From Standing, Reach Forward with Outstretched Arm: Can reach forward >12 cm safely (5") From Standing Position, Pick up Object from Floor: Able to pick up shoe, needs supervision From Standing Position, Turn to Look Behind Over each Shoulder: Turn sideways only but maintains balance Turn 360 Degrees: Needs assistance while turning Standing Unsupported, Alternately Place Feet on Step/Stool: Able to complete >2 steps/needs minimal assist Standing Unsupported, One Foot in Front: Able to plae foot ahead of the other independently and hold 30 seconds Standing on One Leg: Unable to try or needs assist  to prevent fall Total Score: 35 Static Sitting Balance Static Sitting - Balance Support: Feet supported Static Sitting - Level of Assistance: 6: Modified independent (Device/Increase time) Dynamic Sitting Balance Dynamic Sitting - Balance Support: Feet supported Dynamic Sitting - Level of Assistance: 6: Modified independent (Device/Increase time) Static Standing Balance Static Standing - Balance Support: During functional activity;Bilateral upper extremity supported Static Standing - Level of Assistance: 6: Modified independent (Device/Increase time) Dynamic Standing Balance Dynamic Standing - Balance Support: During functional activity;Bilateral upper extremity supported Dynamic Standing - Level of Assistance: 6: Modified independent (Device/Increase time);5: Stand by assistance Extremity/Trunk Assessment RUE Assessment RUE Assessment: Within Functional Limits LUE Assessment LUE Assessment: Exceptions to [facility] General Strength Comments: L UE grossly 4/5, mild coordination deficits however WFL LUE Body System: Neuro Brunstrum levels for arm and hand: Arm;Hand Brunstrum level for arm: Stage V Relative Independence from Synergy Brunstrum level for hand: Stage VI Isolated joint movements   Precious Haws 09/12/2021, 4:09 PM

## 2021-09-12 NOTE — Progress Notes (Signed)
PROGRESS NOTE   Subjective/Complaints:  No issues overnite , excited about d/c on Friday   ROS: Patient denies CP, SOB, N/V/D  Objective:   No results found. No results for input(s): "WBC", "HGB", "HCT", "PLT" in the last 72 hours.  Recent Labs    09/09/21 0727  CREATININE 0.85      Intake/Output Summary (Last 24 hours) at 09/12/2021 0721 Last data filed at 09/11/2021 1822 Gross per 24 hour  Intake 600 ml  Output --  Net 600 ml         Physical Exam: Vital Signs Blood pressure (!) 149/80, pulse (!) 54, temperature 98.4 F (36.9 C), temperature source Oral, resp. rate 18, height 5\' 6"  (1.676 m), weight 72.7 kg, SpO2 100 %.    General: No acute distress Mood and affect are appropriate Heart: Regular rate and rhythm no rubs murmurs or extra sounds Lungs: Clear to auscultation, breathing unlabored, no rales or wheezes Abdomen: Positive bowel sounds, soft nontender to palpation, nondistended Extremities: No clubbing, cyanosis, or edema Skin: No evidence of breakdown, no evidence of rash   Skin: depigmented erythematous plaques forehead neck arms unchanged in appearance Neurologic: Cranial nerves II through XII intact, motor strength is 5/5 in RIght , 4/5 left  deltoid, bicep, tricep, grip, 5/5 R and 3/5 left hip flexor, 4/5 knee extensors, trace ankle dorsiflexor and plantar flexor Sensory exam normal sensation to light touch  in bilateral upper and lower extremities  Finger to thumb opposition slower on left than right side. Good sitting balance Musculoskeletal: Full range of motion in all 4 extremities. No joint swelling   Assessment/Plan: 1. Functional deficits which require 3+ hours per day of interdisciplinary therapy in a comprehensive inpatient rehab setting. Physiatrist is providing close team supervision and 24 hour management of active medical problems listed below. Physiatrist and rehab team  continue to assess barriers to discharge/monitor patient progress toward functional and medical goals  Care Tool:  Bathing    Body parts bathed by patient: Right arm, Left arm, Chest, Abdomen, Front perineal area, Right upper leg, Left upper leg, Face, Buttocks, Right lower leg, Left lower leg   Body parts bathed by helper: Buttocks, Right lower leg, Left lower leg     Bathing assist Assist Level: Contact Guard/Touching assist     Upper Body Dressing/Undressing Upper body dressing   What is the patient wearing?: Pull over shirt, Bra    Upper body assist Assist Level: Set up assist    Lower Body Dressing/Undressing Lower body dressing      What is the patient wearing?: Underwear/pull up, Pants     Lower body assist Assist for lower body dressing: Minimal Assistance - Patient > 75%     Toileting Toileting    Toileting assist Assist for toileting: Contact Guard/Touching assist     Transfers Chair/bed transfer  Transfers assist  Chair/bed transfer activity did not occur: Safety/medical concerns  Chair/bed transfer assist level: Contact Guard/Touching assist     Locomotion Ambulation   Ambulation assist      Assist level: Contact Guard/Touching assist Assistive device: Walker-rolling Max distance: 140ft   Walk 10 feet activity   Assist  Assist level: Contact Guard/Touching assist Assistive device: Walker-rolling   Walk 50 feet activity   Assist Walk 50 feet with 2 turns activity did not occur: Safety/medical concerns  Assist level: Contact Guard/Touching assist Assistive device: Walker-rolling    Walk 150 feet activity   Assist Walk 150 feet activity did not occur: Safety/medical concerns  Assist level: Contact Guard/Touching assist Assistive device: Walker-rolling    Walk 10 feet on uneven surface  activity   Assist Walk 10 feet on uneven surfaces activity did not occur: Safety/medical concerns   Assist level: Minimal  Assistance - Patient > 75% Assistive device: Walker-rolling   Wheelchair     Assist Is the patient using a wheelchair?: Yes (using for energy conservation and time management between therapy gym and room.) Type of Wheelchair: Manual    Wheelchair assist level: Dependent - Patient 0%      Wheelchair 50 feet with 2 turns activity    Assist        Assist Level: Dependent - Patient 0%   Wheelchair 150 feet activity     Assist      Assist Level: Dependent - Patient 0%   Blood pressure (!) 149/80, pulse (!) 54, temperature 98.4 F (36.9 C), temperature source Oral, resp. rate 18, height 5\' 6"  (1.676 m), weight 72.7 kg, SpO2 100 %.  Medical Problem List and Plan: 1. 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  Antithrombotics: -DVT/anticoagulation:  Pharmaceutical: Lovenox             -antiplatelet therapy: Aspirin 81 mg daily and Plavix 75 mg day x3 weeks then aspirin alone starting 09/18/21 3. Pain Management: Tylenol as needed 4. Mood/Behavior/Sleep: Provide emotional support             -antipsychotic agents: N/A 5. Neuropsych/cognition: This patient is capable of making decisions on her own behalf. 6. Skin/Wound Care: Routine skin checks 7. Fluids/Electrolytes/Nutrition:  8/19-discussed why she's on a heart healthy diet. She's willing to continue 8.  Hypertension.  Toprol-XL 50 mg daily.  Monitor with increased mobility             -watch for excessive bradycardia Vitals:   09/11/21 1317 09/11/21 2020  BP: 128/76 (!) 149/80  Pulse: 62 (!) 54  Resp: 20 18  Temp: 98.8 F (37.1 C) 98.4 F (36.9 C)  SpO2: 100% 100%  BP controlled 8/24  9.  Hypothyroidism.  Synthroid 10.  Hyperlipidemia.  Lipitor 11.  Microcytic  anemia.  Iron is within normal limits.  Follow-up H&H. 12.  History of discoid lupus. Halobetasol cream BID  Follow-up outpatient. 13.  Tobacco abuse.  Counseling    14.  Constipation Fe supp, add colace  LOS: 10 days A FACE TO FACE EVALUATION WAS PERFORMED  9/24 09/12/2021, 7:21 AM

## 2021-09-12 NOTE — Progress Notes (Signed)
Recreational Therapy Session Note  Patient Details  Name: Susan Farrell MRN: [id number] Date of Birth: [birth year] Today's Date: 09/12/2021  Pain: no c/o Skilled Therapeutic Interventions/Progress Updates:  Pt participated in Community reintegration/outing to [employer] at [employer]	[place] close supervision ambulaory level using RW.  Goals focused on safe community mobility, identification & negotiation of obstacles, accessing public restroom, energy conservation techniques/education.  Pt tearful at times sharing her feelings on looking handicapped.  Emotional support and eduction provided.  See outing goal sheet in shadow chart for full details.   Therapy/Group: [employer]   Katheren Jimmerson 09/12/2021, 3:19 PM

## 2021-09-12 NOTE — Progress Notes (Signed)
Physical Therapy Discharge Summary  Patient Details  Name: Susan Farrell MRN: [id number] Date of Birth: 11-26-1945  Date of Discharge from PT service:September 12, 2021  Today's Date: 09/12/2021 PT Individual Time: 1401-1500 PT Individual Time Calculation (min): 59 min    Patient has met 8 of 9 long term goals due to improved activity tolerance, improved balance, improved postural control, increased strength, ability to compensate for deficits, functional use of  left lower extremity, improved awareness, and improved coordination.  Patient to discharge at an ambulatory level Supervision with RW.   Patient's care partner attended hands on education/training and is independent to provide the necessary physical assistance at discharge.  Reasons goals not met: pt did not meet stair goals due to still requiring CGA to navigate 1 step using RW.   Recommendation:  Patient will benefit from ongoing skilled PT services in outpatient setting to continue to advance safe functional mobility, address ongoing impairments in left LE strengthening, balance, overall endurance, gait training with LRAD, and minimize fall risk.  Equipment: RW  Reasons for discharge: treatment goals met and discharge from hospital  Patient/family agrees with progress made and goals achieved: Yes  Skilled Therapeutic Interventions/Progress Updates:   Pt received sitting EOB with her husband, Jori Moll, present for hands-on education/training. Pt eager to participate in therapy session.   SPT and DPT educated pt's husband on the following:  - use of gait belt - need to guard pts left side due to left hemi - parts of RW and how to fold  - Donning and doffing of AFO, frequency of use, and need for daily skin checks  - purpose of leather toe cap  - where to guard and sequencing for bed, chair, and car transfers, ambulation, ramp, and step negotiation  - signs of a stroke (BE FAST)   Pt amb ~164f with RW, car transfer, and  ramp negotiation with supervision while SPT educated husband on proper guarding and assisting technique. Pt performed amb 1527fx 2, car transfer x 1 , and ramp negotiation x 1 using RW with husband demonstrating excellent assistance technique.   Pt demonstrated good donning and doffing technique of the AFO independently and no cues needed.    Educated pt/husband on safe set-up in preparation for step navigation to enter home including placement of RW, ascending with right foot first and descending with left leg first. Performed on 5 inch step x 2 with SPT with CGA and RW, and performed x 3 with husband providing correct guarding technique.    Lastly educated pt and husband on signs of a stroke (BE FAST) and both pt and husband verbalized understanding.   Pt amb from main therapy gym to room with RW and CGA from husband. Pt left seated EOB with call bell in reach and all needs met.        PT Discharge Precautions/Restrictions Precautions Precautions: Fall Precaution Comments: L hemi Restrictions Weight Bearing Restrictions: No   Pain Pain Assessment Pain Scale: 0-10 Pain Score: 0-No pain Pain Interference Pain Interference Pain Effect on Sleep: 0. Does not apply - I have not had any pain or hurting in the past 5 days Pain Interference with Therapy Activities: 1. Rarely or not at all Pain Interference with Day-to-Day Activities: 1. Rarely or not at all Vision/Perception  Vision - History Ability to See in Adequate Light: 0 Adequate Perception Perception: Within Functional Limits Praxis Praxis: Intact  Cognition Overall Cognitive Status: Within Functional Limits for tasks assessed Arousal/Alertness: Awake/alert Orientation Level:  Oriented X4 Attention: Focused;Sustained;Selective Focused Attention: Appears intact Sustained Attention: Appears intact Selective Attention: Appears intact Memory: Appears intact Awareness: Appears intact Problem Solving: Appears  intact Safety/Judgment: Appears intact Sensation Sensation Light Touch: Appears Intact Hot/Cold: Not tested Proprioception: Impaired by gross assessment Stereognosis: Not tested Coordination Gross Motor Movements are Fluid and Coordinated: No Fine Motor Movements are Fluid and Coordinated: Yes Coordination and Movement Description: Continues to be impaired due to left LE paresis although improved since eval. Motor  Motor Motor: Hemiplegia Motor - Discharge Observations: L hemi ( LLE>LUE) improved from eval  Mobility Bed Mobility Bed Mobility: Supine to Sit;Sit to Supine Supine to Sit: Independent Sit to Supine: Independent Transfers Transfers: Sit to Stand;Stand to [employer] Transfers Sit to Stand: Independent with assistive device (RW) Stand to Sit: Independent with assistive device (RW) Stand Pivot Transfers: Independent with assistive device (RW) Transfer (Assistive device): Rolling walker Locomotion  Gait Ambulation: Yes Gait Assistance: Supervision/Verbal cueing Gait Distance (Feet): 400 Feet Assistive device: Rolling walker;Other (Comment) ([place] walk on PSL AFO on L LE) Gait Assistance Details: Verbal cues for safe use of DME/AE Gait Gait: Yes Gait Pattern: Impaired Gait Pattern: Decreased hip/knee flexion - left;Decreased step length - left;Poor foot clearance - left;Decreased stance time - left Gait velocity: Avg speed 0.87ms (taken during 6MWT) Stairs / Additional Locomotion Stairs: Yes Stairs Assistance: Contact Guard/Touching assist Stair Management Technique: No rails;Step to pattern;Other (comment) (with RW) Number of Stairs: 4 Height of Stairs: 8 Ramp: Supervision/Verbal cueing Curb: Supervision/Verbal cueing Wheelchair Mobility Wheelchair Mobility: No  Trunk/Postural Assessment  Cervical Assessment Cervical Assessment: Ex[facility] to [facility](s[facility] forward head) Thoracic Assessment Thoracic Assessment: Within Functional Limits Lumbar  Assessment Lumbar Assessment: Within Functional Limits Postural Control Postural Control: Deficits on evaluation Righting Reactions: continues to present with impaired balance however righting reactions improved from eval  Balance Balance Balance Assessed: Yes Standardized Balance Assessment Standardized Balance Assessment: Berg Balance Test Berg Balance Test Sit to Stand: Able to stand  independently using hands Standing Unsupported: Able to stand 2 minutes with supervision Sitting with Back Unsupported but Feet Supported on Floor or Stool: Able to sit safely and securely 2 minutes Stand to Sit: Sits safely with minimal use of hands Transfers: Able to transfer safely, definite need of hands Standing Unsupported with Eyes Closed: Able to stand 10 seconds with supervision Standing Ubsupported with Feet Together: Able to place feet together independently and stand for 1 minute with supervision From Standing, Reach Forward with Outstretched Arm: Can reach forward >12 cm safely (5") From Standing Position, Pick up Object from Floor: Able to pick up shoe, needs supervision From Standing Position, Turn to Look Behind Over each Shoulder: Turn sideways only but maintains balance Turn 360 Degrees: Needs assistance while turning Standing Unsupported, Alternately Place Feet on Step/Stool: Able to complete >2 steps/needs minimal assist Standing Unsupported, One Foot in Front: Able to plae foot ahead of the other independently and hold 30 seconds Standing on One Leg: Unable to try or needs assist to prevent fall Total Score: 35 Static Sitting Balance Static Sitting - Balance Support: Feet supported Static Sitting - Level of Assistance: 6: Modified independent (Device/Increase time) Dynamic Sitting Balance Dynamic Sitting - Balance Support: Feet supported Dynamic Sitting - Level of Assistance: 6: Modified independent (Device/Increase time) Static Standing Balance Static Standing - Balance Support:  During functional activity;Bilateral upper extremity supported Static Standing - Level of Assistance: 6: Modified independent (Device/Increase time) Dynamic Standing Balance Dynamic Standing - Balance Support: During functional activity;Bilateral upper  extremity supported Dynamic Standing - Level of Assistance: 6: Modified independent (Device/Increase time);5: Stand by assistance Extremity Assessment  RLE Assessment RLE Assessment: Within Functional Limits Active Range of Motion (AROM) Comments: WFL General Strength Comments: grossly 5/5 in sitting with hip flexion being 4+/5. LLE Assessment LLE Assessment: Exceptions to [facility] General Strength Comments: assessed in sitting LLE Strength Left Hip Flexion: 3+/5 Left Knee Flexion: 3-/5 Left Knee Extension: 4+/5 Left Ankle Dorsiflexion: 1/5 Left Ankle Plantar Flexion: 2-/5   Kayleen Memos, SPT 09/12/2021, 6:33 PM

## 2021-09-12 NOTE — Progress Notes (Signed)
Physical Therapy Session Note  Patient Details  Name: Susan Farrell MRN: [id number] Date of Birth: [birth year]  Today's Date: 09/12/2021 PT Individual Time: 1037-1200 PT Individual Time Calculation (min): 83 min   Short Term Goals: Week 2:  PT Short Term Goal 1 (Week 2): = to LTGs based on ELOS  Skilled Therapeutic Interventions/Progress Updates:    Pt received sitting on EOB and agreeable to therapy session with plan to perform Community Outing with Misty Stanley, Rec Therapist to address functional mobility in the community setting. Pt already wearing her personal L LE AFO and shoes and utilized her personal RW throughout session. Nursing staff aware of outing.  Transported to/from [place] in w/c for time management and energy conservation.   Pt excited to report that she was able to mover her L toes this AM!  Pt navigate the van steps entering/exiting x2 using UE support on the van bar with only min assist from therapist and pt demonstrating excellent ability to lift L LE up onto the step with active hip//knee flexion and good knee control during descent - ascended forward and descended backwards with variation of step-to and reciprocal pattern.   Pt ambulated from [place] into a coffee shop using RW with only close supervision from therapist for safety. Pt able to manage opening the door to enter/exit coffee shop while managing her AD with close supervision and verbal cuing for technique.  Throughout outing discussed the following:  - energy conservation strategies - bathroom accessibility  - selection of certain tables/chairs for improved accessibility  - needing assistance or accommodations to transport her drink/food to the table - family support at home and in community  Pt becomes tearful during outing expressing her concerns about her appearing to be handicapped - emotional support, encouragement, and education provided to support pt in coping with this.   Please see paper copy of goals  addressed during the outing that are located in pt's paper chart.  Nurse notified of pt's return to CIR. At end of session, pt left seated EOB with needs in reach.   Therapy Documentation Precautions:  Precautions Precautions: Fall Precaution Comments: L hemi Restrictions Weight Bearing Restrictions: No   Pain:  No reports of pain throughout session.  Therapy/Group: Individual Therapy  Ginny Forth , PT, DPT, NCS, CSRS 09/12/2021, 8:07 AM

## 2021-09-12 NOTE — Progress Notes (Signed)
Occupational Therapy Session Note  Patient Details  Name: Susan Farrell MRN: [id number] Date of Birth: 1945-06-28  Today's Date: 09/12/2021 OT Individual Time: 1305-1400 OT Individual Time Calculation (min): 55 min    Short Term Goals: Week 2:  OT Short Term Goal 1 (Week 2): STG = LTG  Skilled Therapeutic Interventions/Progress Updates:  Pt greeted seated EOB with husband present for family ed. General education provided on pts overall current level of functional Supervision- MOD I. Provided verbal education of pts general dressing routine/shower routine. Education provided on recommendation of 24/7 supervision during functional mobility and ADLs.  Pt completed ambulatory transfer with Rw and supervision to ADL apt, education provided to husband about walking with pt on her L side with husband able to demo technique. Pt completed shower transfer to walkin shower with Rw and supervision with pt able to step over threshold using Rw, education provided on general fall prevention for bathing. Husband verbalized understanding of shower transfer technique.  Pt demo'ed functional ambulation in apt with husband with overall supervision assist. Did issue pt Rw bag to assist with fall prevention and energy conservation. Recommended pt completed grooming tasks also from sitting for energy conservation. Reviewed HEP with pt.  Remainder of session focus LLE strengthening with pt completed below therapeutic activities:  - 30 secs of to taps on 3 inch step to improve L hip flexion, pt completed task with BUE support and CGA -pt completed x10 sit>stands from EOM with pt holding 4.4 lb ball and pushing ball OH for increased UB strength/endurance, pt completed task with CGA -standing on airex cushion pt able to toss ball to re bounder with no UE support to challenge dynamic standing balance, pt completed task with CGA, one LOB but able to recover by reaching for RW.  - pt completed >100 ft of functional  ambulation wit husband with no LOB.   Pt left seated EOB with all needs within reach.  Therapy Documentation Precautions:  Precautions Precautions: Fall Precaution Comments: L hemi Restrictions Weight Bearing Restrictions: No  Pain: no pain reported during session .    Therapy/Group: Individual Therapy  Susan Farrell 09/12/2021, 3:36 PM

## 2021-09-13 DIAGNOSIS — D649 Anemia, unspecified: Secondary | ICD-10-CM

## 2021-09-13 DIAGNOSIS — E785 Hyperlipidemia, unspecified: Secondary | ICD-10-CM

## 2021-09-13 NOTE — Progress Notes (Signed)
Inpatient Rehabilitation Care Coordinator Discharge Note   Patient Details  Name: Susan Farrell MRN: [id number] Date of Birth: April 09, 1945   Discharge location: D/c to home  Length of Stay: 10 days  Discharge activity level: Supervision with RW  Home/community participation: Limited  Patient response UX:LKGMWN Literacy - How often do you need to have someone help you when you read instructions, pamphlets, or other written material from your doctor or pharmacy?: Never  Patient response UU:VOZDGU Isolation - How often do you feel lonely or isolated from those around you?: Never  Services provided included: MD, RD, OT, SLP, CM, Pharmacy, Neuropsych, SW, RN, TR, PT  Financial Services:  Field seismologist Utilized: Private Insurance [employer]  Choices offered to/list presented to: Yes  Follow-up services arranged:  Outpatient, DME    Outpatient Servicies: [facility] for PT/OT DME : [facility] and RW   Patient response to transportation need: Is the patient able to respond to transportation needs?: Yes In the past 12 months, has lack of transportation kept you from medical appointments or from getting medications?: No In the past 12 months, has lack of transportation kept you from meetings, work, or from getting things needed for daily living?: No   Comments (or additional information):  Patient/Family verbalized understanding of follow-up arrangements:  Yes  Individual responsible for coordination of the follow-up plan: contact pt  Confirmed correct DME delivered: Gretchen Short 09/13/2021    Gretchen Short

## 2021-09-13 NOTE — Progress Notes (Signed)
Inpatient Rehabilitation  Discharge Medication Review by a Pharmacist  A complete drug regimen review was completed for this patient to identify any potential clinically significant medication issues.  High Risk Drug Classes Is patient taking? Indication by Medication  Antipsychotic No   Anticoagulant No   Antibiotic No   Opioid No   Antiplatelet Yes Aspirin, plavix- CVA ppx  Hypoglycemics/insulin No   Vasoactive Medication Yes Toprol- hypertension  Chemotherapy No   Other Yes Synthroid- hypothyroidism Crestor- HLD Niferex- iron supplement Colace- for consttipation/ bowel regimen     Type of Medication Issue Identified Description of Issue Recommendation(s)  Drug Interaction(s) (clinically significant)     Duplicate Therapy     Allergy     No Medication Administration End Date  Plavix Per neurology- continue DAPT (ASA, Plavix) x3 weeks followed by aspirin alone Plavix end date: Sep 18, 2021  Dan Angiulli, PA-c  confirmend that he presribed only 6 tablets of Plavix at discharge and he instructed patient to stop taking plavix after last tablet taken.    Incorrect Dose     Additional Drug Therapy Needed     Significant med changes from prior encounter (inform family/care partners about these prior to discharge). Crestor dose increase Crestor dose PTA 5mg  was stopped and correctly changed to Crestor 40 mg on discharge.  Other  l     Clinically significant medication issues were identified that warrant physician communication and completion of prescribed/recommended actions by midnight of the next day:  No   Time spent performing this drug regimen review (minutes):  30  , Noah Delaine Clinical Pharmacist  09/13/2021 1:19 PM Please check AMION for all [facility] Pharmacy phone numbers After 10:00 PM, call Main Pharmacy [phone]

## 2021-09-13 NOTE — Progress Notes (Signed)
Recreational Therapy Discharge Summary Patient Details  Name: Susan Farrell MRN: [id number] Date of Birth: February 28, 1945 Today's Date: 09/13/2021  Comments on progress toward goals: Pt has made excellent progress during LOS and is ready for discharge home today with husband at supervision ambulatory level using RW.  TR session focused on pt education in regards to leisure education, activity analysis/modifications, coping and community reintegration.  Pt participated in an outing to a coffee shop at close supervision level.  Pt intermittently tearful during the outing, recognizing difficulty coping with current functional status.  Emotional support provided.  Pt is excited about discharge home today and plans to return to previously enjoyed activities as able.  Reasons for discharge: discharge from hospital  Follow-up: Outpatient  Patient/family agrees with progress made and goals achieved: Yes  Semaja Lymon 09/13/2021, 8:31 AM

## 2021-09-13 NOTE — Progress Notes (Signed)
PROGRESS NOTE   Subjective/Complaints:  No new concerns or issues this AM.She is looking forward to going home today.   ROS: Patient denies CP, SOB, N/V/D, abdominal pain  Objective:   No results found. No results for input(s): "WBC", "HGB", "HCT", "PLT" in the last 72 hours.  No results for input(s): "NA", "K", "CL", "CO2", "GLUCOSE", "BUN", "CREATININE", "CALCIUM" in the last 72 hours.    Intake/Output Summary (Last 24 hours) at 09/13/2021 0911 Last data filed at 09/13/2021 0825 Gross per 24 hour  Intake 720 ml  Output --  Net 720 ml         Physical Exam: Vital Signs Blood pressure 137/74, pulse 61, temperature 98.5 F (36.9 C), temperature source Oral, resp. rate 20, height 5\' 6"  (1.676 m), weight 72.7 kg, SpO2 99 %.    General: No acute distress Mood and affect are appropriate Heart: Regular rate and rhythm no rubs murmurs or extra sounds Lungs: Clear to auscultation, breathing unlabored, no rales or wheezes, good air movement Abdomen: Positive bowel sounds, soft nontender to palpation, nondistended Extremities: No clubbing, cyanosis, or edema Skin: No evidence of breakdown, no evidence of rash, warm and dry   Skin: depigmented erythematous plaques forehead neck arms unchanged in appearance Neurologic: Cranial nerves II through XII intact, motor strength is 5/5 in RIght , 4/5 left  deltoid, bicep, tricep, grip, 5/5 R and 3/5 left hip flexor, 4/5 knee extensors, trace ankle dorsiflexor and plantar flexor Sensory exam normal sensation to light touch  in bilateral upper and lower extremities  Finger to thumb opposition slower on left than right side. Good sitting balance Musculoskeletal: Full range of motion in all 4 extremities. No joint swelling   Assessment/Plan: 1. Functional deficits which require 3+ hours per day of interdisciplinary therapy in a comprehensive inpatient rehab setting. Physiatrist is  providing close team supervision and 24 hour management of active medical problems listed below. Physiatrist and rehab team continue to assess barriers to discharge/monitor patient progress toward functional and medical goals  Care Tool:  Bathing    Body parts bathed by patient: Right arm, Left arm, Chest, Abdomen, Front perineal area, Right upper leg, Left upper leg, Face, Buttocks, Right lower leg, Left lower leg   Body parts bathed by helper: Buttocks, Right lower leg, Left lower leg     Bathing assist Assist Level: Supervision/Verbal cueing     Upper Body Dressing/Undressing Upper body dressing   What is the patient wearing?: Pull over shirt, Bra    Upper body assist Assist Level: Independent with assistive device    Lower Body Dressing/Undressing Lower body dressing      What is the patient wearing?: Underwear/pull up, Pants     Lower body assist Assist for lower body dressing: Supervision/Verbal cueing     Toileting Toileting    Toileting assist Assist for toileting: Supervision/Verbal cueing     Transfers Chair/bed transfer  Transfers assist  Chair/bed transfer activity did not occur: Safety/medical concerns  Chair/bed transfer assist level: Independent with assistive device Chair/bed transfer assistive device:   Ambulation assist      Assist level: Supervision/Verbal cueing Assistive device: Walker-rolling Max  distance: 447ft   Walk 10 feet activity   Assist     Assist level: Supervision/Verbal cueing Assistive device: Walker-rolling   Walk 50 feet activity   Assist Walk 50 feet with 2 turns activity did not occur: Safety/medical concerns  Assist level: Supervision/Verbal cueing Assistive device: Walker-rolling    Walk 150 feet activity   Assist Walk 150 feet activity did not occur: Safety/medical concerns  Assist level: Supervision/Verbal cueing Assistive device: Walker-rolling    Walk 10 feet  on uneven surface  activity   Assist Walk 10 feet on uneven surfaces activity did not occur: Safety/medical concerns   Assist level: Supervision/Verbal cueing Assistive device: Walker-rolling   Wheelchair     Assist Is the patient using a wheelchair?: No Type of Wheelchair: Manual    Wheelchair assist level: Dependent - Patient 0%      Wheelchair 50 feet with 2 turns activity    Assist        Assist Level: Dependent - Patient 0%   Wheelchair 150 feet activity     Assist      Assist Level: Dependent - Patient 0%   Blood pressure 137/74, pulse 61, temperature 98.5 F (36.9 C), temperature source Oral, resp. rate 20, height 5\' 6"  (1.676 m), weight 72.7 kg, SpO2 99 %.  Medical Problem List and Plan: 1. 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  Antithrombotics: -DVT/anticoagulation:  Pharmaceutical: Lovenox             -antiplatelet therapy: Aspirin 81 mg daily and Plavix 75 mg day x3 weeks then aspirin alone starting 09/18/21 3. Pain Management: Tylenol as needed 4. Mood/Behavior/Sleep: Provide emotional support             -antipsychotic agents: N/A 5. Neuropsych/cognition: This patient is capable of making decisions on her own behalf. 6. Skin/Wound Care: Routine skin checks 7. Fluids/Electrolytes/Nutrition:  8/19-discussed why she's on a heart healthy diet. She's willing to continue 8.  Hypertension.  Toprol-XL 50 mg daily.  Monitor with increased mobility             -watch for excessive bradycardia Vitals:   09/12/21 1942 09/13/21 0505  BP: (!) 141/75 137/74  Pulse: 63 61  Resp: 16 20  Temp: 98.7 F (37.1 C) 98.5 F (36.9 C)  SpO2: 100% 99%  BP with few elevated values but overall well  controlled, continue to monitor  9.  Hypothyroidism.  Synthroid 10.  Hyperlipidemia.  Lipitor, heart healthy diet 11.  Microcytic anemia.  Iron is within normal limits.  Follow-up H&H.  -HGB stable 11 8/15 12.  History of discoid lupus. Halobetasol cream BID  Follow-up outpatient. 13.  Tobacco abuse.  Counseling    14.  Constipation Fe supp, add colace  LOS: 11 days A FACE TO FACE EVALUATION WAS PERFORMED  9/15 09/13/2021, 9:11 AM

## 2021-09-17 ENCOUNTER — Telehealth: Payer: Self-pay

## 2021-09-17 NOTE — Telephone Encounter (Signed)
Transitional Care call--who you talked with Durene Fruits    Are you/is patient experiencing any problems since coming home? Are there any questions regarding any aspect of care? No,no Are there any questions regarding medications administration/dosing? Are meds being taken as prescribed? Patient should review meds with caller to confirm no Have there been any falls? no Has Home Health been to the house and/or have they contacted you? If not, have you tried to contact them? Can we help you contact them? No  Are bowels and bladder emptying properly? Are there any unexpected incontinence issues? If applicable, is patient following bowel/bladder programs? yes Any fevers, problems with breathing, unexpected pain? no Are there any skin problems or new areas of breakdown? no Has the patient/family member arranged specialty MD follow up (ie cardiology/neurology/renal/surgical/etc)?  Can we help arrange? no Does the patient need any other services or support that we can help arrange? no Are caregivers following through as expected in assisting the patient? yes Has the patient quit smoking, drinking alcohol, or using drugs as recommended? yes  Appointment time, arrive time and who it is with here [address] [id number]

## 2021-09-18 ENCOUNTER — Encounter: Payer: Self-pay | Admitting: Physical Therapy

## 2021-09-18 ENCOUNTER — Ambulatory Visit: Payer: Medicare HMO | Attending: Physician Assistant | Admitting: Physical Therapy

## 2021-09-18 ENCOUNTER — Ambulatory Visit: Payer: Medicare HMO

## 2021-09-18 DIAGNOSIS — R278 Other lack of coordination: Secondary | ICD-10-CM | POA: Diagnosis not present

## 2021-09-18 DIAGNOSIS — R269 Unspecified abnormalities of gait and mobility: Secondary | ICD-10-CM | POA: Diagnosis not present

## 2021-09-18 DIAGNOSIS — R2689 Other abnormalities of gait and mobility: Secondary | ICD-10-CM | POA: Insufficient documentation

## 2021-09-18 DIAGNOSIS — I6381 Other cerebral infarction due to occlusion or stenosis of small artery: Secondary | ICD-10-CM

## 2021-09-18 DIAGNOSIS — M6281 Muscle weakness (generalized): Secondary | ICD-10-CM | POA: Diagnosis not present

## 2021-09-18 DIAGNOSIS — R262 Difficulty in walking, not elsewhere classified: Secondary | ICD-10-CM | POA: Insufficient documentation

## 2021-09-18 NOTE — Therapy (Addendum)
OUTPATIENT PHYSICAL THERAPY NEURO EVALUATION   Patient Name: Susan Farrell MRN: [id number] DOB:1945-04-02, 76 y.o., female Today's Date: 09/18/2021   PCP: Susan Reichmann, MD REFERRING PROVIDER: Charlton Amor, PA-C   PT End of Session - 09/18/21 [phone]     Visit Number 1    Number of Visits 24    Date for PT Re-Evaluation 12/11/21    Progress Note Due on Visit 10    PT Start Time 1000    PT Stop Time 1055    PT Time Calculation (min) 55 min    Equipment Utilized During Treatment Gait belt    Activity Tolerance Patient tolerated treatment well    Behavior During Therapy WFL for tasks assessed/performed             History reviewed. No pertinent past medical history. 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   Anemia 09/13/2019   Goiter 09/13/2019   Osteopenia 09/13/2019    ONSET DATE: 08/28/21  REFERRING DIAG: I63.81 (ICD-10-CM) - 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)  THERAPY DIAG:  Abnormality of gait and mobility - Plan: PT plan of care cert/re-cert  Difficulty in walking, not elsewhere classified - Plan: PT plan of care cert/re-cert  Muscle weakness (generalized) - Plan: PT plan of care cert/re-cert  Other abnormalities of gait and mobility - Plan: PT plan of care cert/re-cert  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) - Plan: PT  plan of care cert/re-cert  Rationale for Evaluation and Treatment Rehabilitation  SUBJECTIVE:                                                                                                                                                                                              SUBJECTIVE STATEMENT: Pt reports chief complaint is lower extremity Left sided weakness. Pt reports she had one slip on a towell in the hospital where she ended up in the floor but personally would not consider it to be a fall. Pt reports no other falls prior to this or since. Pt reports no  significant Upper extremity deficits or memory or language deficits at this time and OT in hospital seemed to work more on LE deficits, again per her report. Pt reports no prior surgeries or pain limiting her mobility. Pt reports at this time she is using a walker to move everywhere in the house per instruction of medical staff.  Pt accompanied by: self  PERTINENT HISTORY:  Per Brief HPI from d/c 8/14: Susan Farrell is a 76 y.o. right-handed female with history of discoid lupus, hypertension, hyperlipidemia, hypothyroidism, abdominal aortic aneurysm with repair tobacco use.  Per chart review lives with spouse independent prior to admission. Presented to [facility] 08/28/2021 with acute onset of left-sided weakness. 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  Underlying age-related cerebral atrophy with mild chronic small vessel changes.  CT angiogram head and neck showed internal carotid and vertebral arteries to be patent.  No intracranial large vessel occlusion or stenosis.   She did sustain a fall 08/30/2021 when attempting to get out of bed without assistance and no injury sustained.  Therapy evaluations completed due to patient decreased functional mobility was admitted for a comprehensive rehab program.  PAIN:  Are you having pain? No  PRECAUTIONS: Fall  WEIGHT BEARING RESTRICTIONS No  FALLS:  Has patient fallen in last 6 months? Yes. Number of falls 1, slipped on towel in hospital and ended up in floor.   LIVING ENVIRONMENT: Lives with: lives with their family and lives with their partner Lives in: House/apartment Stairs: Yes: External: 1 steps; none Has following equipment at home: Dan Humphreys - 2 wheeled, Environmental consultant - 4 wheeled, bed side commode, and only uses bed side commode at night   PLOF: Independent, Independent with basic ADLs, Independent with household mobility without device, Independent with community mobility without device, Independent with gait, and Leisure: going to church, going to grocery and department stores.   PATIENT GOALS Get back to normal walking and balance without any support.   OBJECTIVE:   DIAGNOSTIC FINDINGS:  CT/MRI showed a 1 cm acute ischemic nonhemorrhagic right basal ganglia infarction.  Underlying age-related cerebral atrophy with mild chronic small vessel changes.  CT angiogram head and neck showed internal carotid and vertebral arteries to be patent.  No intracranial large vessel occlusion or stenosis.   COGNITION: Overall cognitive status: Within functional limits for tasks assessed   SENSATION: WFL  COORDINATION: Failed heel to shin test on L, completes with ease on left   EDEMA: minimal present on L, is wearing compression stockings in day right now     LOWER EXTREMITY ROM:   WNL or tasks performed    LOWER EXTREMITY MMT:    MMT Right Eval Left Eval  Hip flexion 5 3+  Hip extension    Hip abduction 5 4  Hip adduction 5 4  Hip internal rotation    Hip external rotation    Knee flexion 5 3+  Knee extension 5 3+  Ankle dorsiflexion    Ankle plantarflexion    Ankle inversion    Ankle eversion    (Blank rows = not tested)  BED MOBILITY:  Independent per report   TRANSFERS: Assistive device utilized: Environmental consultant - 4 wheeled  Sit to stand: Modified independence Stand to sit: Modified independence Chair to chair: Modified  independence   RAMP:   GAIT: Gait pattern: decreased step length- Left and decreased stance time- Left Distance walked: 234 ft Assistive device utilized: Walker - 4 wheeled Level of assistance: SBA Comments:  L foot drop noted and above alteration to gait pattern noted as well.   FUNCTIONAL TESTs:  5 times sit to stand: 18.02 sec 22 6 minute walk test: 234 feet and requires seated rest break due to lower extremity fatigue on the left lower extremity  Berg Balance Scale: 36  PATIENT SURVEYS:  FOTO 65  TODAY'S TREATMENT:  Eval only    PATIENT EDUCATION: Education details: POC Person educated: Patient Education method: Explanation Education comprehension: verbalized understanding   HOME EXERCISE PROGRAM: Demonstrated 3/4 romberg stance with involved LE posterior at support surface for initial HEP and instructed to continue with hospital HEP ( LAQ, marching, HS curls) and will establish formal outpatient Hep next session.     GOALS: Goals reviewed with patient? Yes  SHORT TERM GOALS: Target date: 10/16/2021       Patient will be independent in home exercise program to improve strength/mobility for better functional independence with ADLs. Baseline: No HEP currently  Goal status: INITIAL  LONG TERM GOALS: Target date: 12/11/2021    1.  Patient (> 49 years old) will complete five times sit to stand test in < 15 seconds indicating an increased LE strength and improved balance. Baseline: 18 sec  Goal status: INITIAL  2.  Patient will increase FOTO score to equal to or greater than  77   to demonstrate statistically significant improvement in mobility and quality of life.  Baseline: 65 Goal status: INITIAL   3.  Patient will increase Berg Balance score by > 6 points to demonstrate decreased fall risk during functional activities. Baseline: 36 Goal status: INITIAL     4.   Patient will increase 10 meter walk test to >1.22m/s as to improve gait speed for better  community ambulation and to reduce fall risk. Baseline: .25m/s Goal status: INITIAL  5.   Patient will increase six minute walk test distance to >1000 for progression to community ambulator and improve gait ability Baseline: 234 ft Goal status: INITIAL    ASSESSMENT:  CLINICAL IMPRESSION: Patient presents to physical therapy for evaluation following right cerebrovascular accident resulting in left-sided residual weakness.  Patient demonstrates impaired coordination on her left lower extremity as well as impaired left lower extremity strength compared to her right.  Patient presents with AFO donned on her left lower extremity to assist with foot clearance.  Patient demonstrates significant risk factor for falls based on Berg balance testing as well as 5 times sit to stand.  In addition patient currently unable to ambulate community levels even with assistive device.  Patient highly motivated to improve return to her prior level of function and will benefit from skilled physical therapy to improve her lower extremity strength, balance, reduce her risk of falls, and her to allow her safe return to her prior level of function.   OBJECTIVE IMPAIRMENTS Abnormal gait, decreased activity tolerance, decreased balance, decreased coordination, decreased endurance, decreased mobility, difficulty walking, and decreased strength.   ACTIVITY LIMITATIONS lifting, bending, standing, squatting, stairs, dressing, and caring for others  PARTICIPATION LIMITATIONS: meal prep, cleaning, driving, shopping, yard work, and church  PERSONAL FACTORS Age are also affecting patient's functional outcome.   REHAB POTENTIAL: Good  CLINICAL DECISION MAKING: Evolving/moderate complexity  EVALUATION COMPLEXITY: Low  PLAN: PT FREQUENCY: 2x/week  PT DURATION: 12 weeks  PLANNED INTERVENTIONS: Therapeutic exercises, Therapeutic activity, Neuromuscular re-education, Balance training, Gait training, Patient/Family  education, Self Care, Joint mobilization, Dry Needling, and Manual therapy  PLAN FOR NEXT SESSION: begin POC, HEP   Cristal Deer  Jasper Loser, PT 09/18/2021, 12:35 PM

## 2021-09-18 NOTE — Therapy (Signed)
OUTPATIENT OCCUPATIONAL THERAPY NEURO EVALUATION  Patient Name: Susan Farrell MRN: [id number] DOB:1945/06/11, 76 y.o., female Today's Date: 09/18/2021  PCP: Dr. Barbette Reichmann REFERRING PROVIDER: Mariam Dollar, PA   OT End of Session - 09/18/21 1819     Visit Number 1    Number of Visits 12    Date for OT Re-Evaluation 10/30/21    Progress Note Due on Visit 10    OT Start Time 1105    OT Stop Time 1158    OT Time Calculation (min) 53 min    Activity Tolerance Patient tolerated treatment well    Behavior During Therapy [facility] for tasks assessed/performed             No past medical history on file. Past Surgical History:  Procedure Laterality Date   ABDOMINAL HYSTERECTOMY     CHOLECYSTECTOMY     ENDOVASCULAR REPAIR/STENT GRAFT N/A 06/12/2020   Procedure: ENDOVASCULAR REPAIR/STENT GRAFT;  Surgeon: Renford Dills, MD;  Location: [facility] INVASIVE CV LAB;  Service: Cardiovascular;  Laterality: N/A;   Patient Active Problem List   Diagnosis Date Noted   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) 09/02/2021   Hypertensive urgency 08/29/2021   Dyslipidemia 08/29/2021   Hypothyroidism 08/29/2021   CVA (cerebral vascular accident) (HCC) 08/28/2021   Ruptured abdominal aortic aneurysm (AAA) (HCC) 06/12/2020   Anemia 09/13/2019   Goiter 09/13/2019   Osteopenia 09/13/2019    ONSET DATE: 08/28/21  REFERRING DIAG: CVA  THERAPY DIAG:  Muscle weakness (generalized)  Other lack of coordination  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)  Rationale for Evaluation and Treatment Rehabilitation  SUBJECTIVE:   SUBJECTIVE STATEMENT: "I'm determined to be indep."  Pt accompanied by: self  PERTINENT HISTORY: Per chart, Susan Farrell is a 76 y.o. right-handed female with history of discoid lupus, hypertension, hyperlipidemia, hypothyroidism, abdominal aortic aneurysm with repair tobacco use.  Per chart review lives with spouse independent  prior to admission.  Presented to [facility] 08/28/2021 with acute onset of left-sided weakness.  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    PRECAUTIONS: Fall  WEIGHT BEARING RESTRICTIONS No  PAIN:  Are you having pain? No  FALLS: Has patient fallen in last 6 months? Yes. Number of falls 1 (slipped on towel in the hospital, no injury)  LIVING ENVIRONMENT: Lives with: lives with their spouse Lives in: 1 level house Stairs: Yes: External: 1 steps; none Has following equipment at home: Dan Humphreys - 2 wheeled and [employer] - 4 wheeled; pt states she's using her 4 wheeled walker primarily as this allows her to get around more quickly.  PLOF: Independent with all aspects of self care and mobility.  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 Leisure: Going to church and bible study, going on a car ride or a walk  PATIENT GOALS "Walk normal" (without a device)  OBJECTIVE:   HAND DOMINANCE: Right  ADLs: Overall ADLs: limited standing tolerance Transfers/ambulation related to ADLs: using rollator at home  Eating: able to cut food without difficulty Grooming: indep UB Dressing: indep; able to manage clothing fasteners independently  LB Dressing: indep Toileting: elevated toilet seat/modified indep Bathing: modified indep with walk in shower and built in seat; pt states she can stand to rinse without UE support Tub Shower transfers: modified indep Equipment:  built in seat, hand held shower hose   IADLs: Shopping: spouse currently managing d/t pt's limited mobility  Light  housekeeping: helping with folding clothes Meal Prep: managing light meal prep with rollator Community mobility: pt used transport chair to reach therapy gym; uses rollator from house to car but has not yet attempted getting out of the car to run errands with spouse Medication management: indep  Financial management: indep Handwriting:  N/A; CVA affected  non-dominant   POSTURE COMMENTS:  No Significant postural limitations in sitting; did not test in standing; defer to PT eval for details  Sitting balance: Moves/returns truncal midpoint >2 inches in all planes  ACTIVITY TOLERANCE: Activity tolerance: limited standing tolerance (pt states she has not yet tried washing dishes)  FUNCTIONAL OUTCOME MEASURES: FOTO: 88 ; predicted: 96  UPPER EXTREMITY ROM : BUEs WNL   UPPER EXTREMITY MMT:     MMT Right eval Left eval  Shoulder flexion 5 5  Shoulder abduction 5 5  Shoulder adduction    Shoulder extension    Shoulder internal rotation    Shoulder external rotation    Middle trapezius    Lower trapezius    Elbow flexion 5 5  Elbow extension 5 5  Wrist flexion 5 5  Wrist extension 5 5  Wrist ulnar deviation    Wrist radial deviation    Wrist pronation    Wrist supination    (Blank rows = not tested)  HAND FUNCTION: Grip strength: Right: 62 lbs; Left: 53 lbs, Lateral pinch: Right: 17 lbs, Left: 12 lbs, and 3 point pinch: Right: 15 lbs, Left: 11 lbs  COORDINATION: Finger Nose Finger test: mildly ataxic throughout the LUE  9 Hole Peg test: Right: 26 sec; Left: 37 sec  SENSATION: WFL  EDEMA: none  MUSCLE TONE: LUE: Within functional limits  COGNITION: Overall cognitive status: Within functional limits for tasks assessed  VISION: Subjective report: pt reports no changes   Baseline vision: Wears glasses for reading only  VISION ASSESSMENT: Tracking/Visual pursuits: Able to track stimulus in all quads without difficulty Saccades: WFL Visual Fields: no apparent deficits  PERCEPTION: WFL  PRAXIS: Impaired: mild LUE ataxia   OBSERVATIONS: Pt pleasant, cooperative, and motivated to return to PLOF.  Good support from spouse.   TODAY'S TREATMENT:  OT eval completed.  Objective measures taken and goals formulated.    Therapeutic Exercise: Reviewed current HEP and offered progressions, including coin manipulation  skills and functional reaching activities with the LUE.  PATIENT EDUCATION: Education details: OT role, goals, poc  Person educated: Patient Education method: Explanation and Verbal cues Education comprehension: verbalized understanding   HOME EXERCISE PROGRAM: Theraputty, functional reaching activities, coin manipulation skills    GOALS: Goals reviewed with patient? Yes  SHORT TERM GOALS: Target date: 10/05/21  Pt will be indep to perform HEP for improving L hand strength and coordination throughout LUE. Baseline: Eval: initiated, will continue to progress Goal status: INITIAL   LONG TERM GOALS: Target date: 11/02/21  Pt will increase FOTO score by 5 points or more to indicate pt perceived improvement in functional performance with daily tasks.. Baseline: Eval: 88 Goal status: INITIAL  2.  Pt will improve L hand dexterity skills for better manipulation of smaller ADL supplies (improve L 9 hole by 5 or more seconds) Baseline: Eval: L 9 hole 37s, R 26s Goal status: INITIAL  3.  Pt will be able to put away dishes in overhead cabinet using LUE with reported confidence with accuracy and control to reach toward a target.   Baseline: Eval: pt would use the R d/t mild LUE ataxia (but has not  attempted d/t limited standing) Goal status: INITIAL  4.  Pt will increase standing tolerance to enable washing a sink full of dishes with 1 or no sitting breaks.  Baseline: Eval: spouse washing dishes d/t pt with limited standing tolerance Goal status: INITIAL  5.  Pt will increase L hand strength for better manipulation and stabilization of ADL supplies. Baseline: Eval: L grip 54 (R 62); L lateral pinch 12 (R 17); L 3 point pinch 11 (R 15) Goal status: INITIAL    ASSESSMENT:  CLINICAL IMPRESSION: Patient is a 76 y.o. female who was seen today for occupational therapy evaluation for functional impairments post CVA of the R basal ganglia.  Pt presents with mild weakness in the L hand,  mild ataxia throughout the LUE, and limited standing tolerance and mobility.  PERFORMANCE DEFICITS in functional skills including ADLs, IADLs, coordination, dexterity, strength, FMC, GMC, mobility, balance, endurance, and UE functional use. IMPAIRMENTS are limiting patient from ADLs, IADLs, and leisure.   COMORBIDITIES may have co-morbidities  that affects occupational performance. Patient will benefit from skilled OT to address above impairments and improve overall function.  MODIFICATION OR ASSISTANCE TO COMPLETE EVALUATION: No modification of tasks or assist necessary to complete an evaluation.  OT OCCUPATIONAL PROFILE AND HISTORY: Problem focused assessment: Including review of records relating to presenting problem.  CLINICAL DECISION MAKING: Moderate - several treatment options, min-mod task modification necessary  REHAB POTENTIAL: Good  EVALUATION COMPLEXITY: Moderate    PLAN: OT FREQUENCY: 1-2x/week  OT DURATION: 6 weeks  PLANNED INTERVENTIONS: self care/ADL training, therapeutic exercise, therapeutic activity, neuromuscular re-education, balance training, functional mobility training, patient/family education, and DME and/or AE instructions  RECOMMENDED OTHER SERVICES: none  CONSULTED AND AGREED WITH PLAN OF CARE: Patient  PLAN FOR NEXT SESSION: LUE neuro re-ed, therapeutic exercises, standing tolerance  Danelle Earthly, MS, OTR/L  Otis Dials, OT 09/18/2021, 7:13 PM

## 2021-09-25 ENCOUNTER — Encounter: Payer: Medicare HMO | Attending: Registered Nurse | Admitting: Registered Nurse

## 2021-09-25 ENCOUNTER — Ambulatory Visit: Payer: Medicare HMO | Attending: Physician Assistant

## 2021-09-25 ENCOUNTER — Encounter: Payer: Self-pay | Admitting: Registered Nurse

## 2021-09-25 VITALS — BP 184/80 | HR 64 | Ht 66.0 in | Wt 164.6 lb

## 2021-09-25 DIAGNOSIS — M6281 Muscle weakness (generalized): Secondary | ICD-10-CM | POA: Insufficient documentation

## 2021-09-25 DIAGNOSIS — I10 Essential (primary) hypertension: Secondary | ICD-10-CM | POA: Diagnosis not present

## 2021-09-25 DIAGNOSIS — E7849 Other hyperlipidemia: Secondary | ICD-10-CM | POA: Insufficient documentation

## 2021-09-25 DIAGNOSIS — R278 Other lack of coordination: Secondary | ICD-10-CM | POA: Diagnosis not present

## 2021-09-25 DIAGNOSIS — I6381 Other cerebral infarction due to occlusion or stenosis of small artery: Secondary | ICD-10-CM | POA: Insufficient documentation

## 2021-09-25 DIAGNOSIS — R262 Difficulty in walking, not elsewhere classified: Secondary | ICD-10-CM | POA: Diagnosis not present

## 2021-09-25 DIAGNOSIS — R269 Unspecified abnormalities of gait and mobility: Secondary | ICD-10-CM | POA: Diagnosis not present

## 2021-09-25 NOTE — Patient Instructions (Addendum)
Call Dr Marcello Fennel Office to obtain an appointment with Dr Marcello Fennel and you need  a referral for Neurology.  [phone]

## 2021-09-25 NOTE — Progress Notes (Signed)
Subjective:    Patient ID: Susan Farrell, female    DOB: Feb 23, 1945, 76 y.o.   MRN: [id number]  HPI: Susan Farrell is a 76 y.o. female who is here for Transitional Care appointment for F/U of her  CVA of Right Basal Ganglia, Essential Hypertension and Hyperlipidemia, she arrived to office with uncontrolled hypertension. She presented to [facility] on 08/28/2021 with acute onset of left- sided weakness.  DR Mansy H&P Note from 08/28/2021  Susan Farrell is a 76 y.o. African-American female with medical history significant for discoid lupus and hypertension, dyslipidemia, hypothyroidism, abdominal aortic aneurysm, s/p rupture and repair, and tobacco abuse, who presented to the emergency room with acute onset of left lower extremity weakness that started around 1 PM today with no paresthesias or slurred speech.  She denies any headache or dizziness or blurred vision.  No tinnitus or vertigo.  No urinary or stool incontinence.  No other focal deficits.  No dysuria, oliguria or hematuria or flank pain.  No nausea or vomiting or abdominal pain.  No chest pain or palpitations.  No cough or wheezing or orthopnea or paroxysmal nocturnal dyspnea.   CT Head: WO Contrast:  IMPRESSION: No evidence of acute intracranial abnormality. ASPECTS is 10.   Chronic right basal ganglia lacunar infarct.   Mild generalized cerebral atrophy.  CTA:  IMPRESSION: CTA neck:   1. The common carotid, internal carotid and vertebral arteries are patent within the neck. Mild atherosclerotic narrowing at the origin of the left vertebral artery. 2. Aortic Atherosclerosis (ICD10-I70.0). 3. 2.7 cm left thyroid lobe nodule. This nodule was previously evaluated by ultrasound on 02/08/2013, and fine-needle aspiration was recommended at that time. Please refer to this prior report for further details. 4. Cervical spondylosis. Most notably, a posterior disc osteophyte complex contributes to suspected severe spinal canal  stenosis at C4-C5. Consider a cervical spine MRI to assess for spinal cord impingement at this level, when clinically appropriate.   CTA head:   1. No intracranial large vessel occlusion or proximal high-grade arterial stenosis is identified. 2. 1-2 mm ventrally projecting vascular protrusion arising from the cavernous right ICA, suspicious for an aneurysm.  MR: Brain WO Contrast:  IMPRESSION: 1. 1 cm acute ischemic nonhemorrhagic right basal ganglia infarct. 2. Underlying age-related cerebral atrophy with mild chronic small vessel ischemic disease, with additional chronic right basal ganglia lacunar infarct.  Neurology was consulted: She was maintained on low dose aspirin as well as Plavix x 3 weeks then aspirin alone.   Susan Farrell was admitted to inpatient rehabilitation on 09/02/2021 and discharged home on 09/13/2021. She is receiving outpatient therapy at [facility]. She denies any pain. She rates her pain 0. Also reports she has a good appetite. She reports she is walking at home with walker, she arrived to office in wheelchair.         Pain Inventory Average Pain 0 Pain Right Now 0 My pain is  no pain  LOCATION OF PAIN  no pain  BOWEL Number of stools per week: 7-8   BLADDER Normal    Mobility walk with assistance use a walker ability to climb steps?  yes do you drive?  no Do you have any goals in this area?  yes  Function not employed: date last employed .  Neuro/Psych No problems in this area  Prior Studies Any changes since last visit?  no  Physicians involved in your care Any changes since last visit?  no   Family History  Problem Relation Age of Onset   Breast cancer Neg Hx    Social History   Socioeconomic History   Marital status: Married    Spouse name: Not on file   Number of children: Not on file   Years of education: Not on file   Highest education level: Not on file  Occupational History   Not on file   Tobacco Use   Smoking status: Every Day    Types: Cigarettes   Smokeless tobacco: Current  Substance and Sexual Activity   Alcohol use: Not Currently   Drug use: Never   Sexual activity: Yes  Other Topics Concern   Not on file  Social History Narrative   Not on file   Social Determinants of Health   Financial Resource Strain: Not on file  Food Insecurity: Not on file  Transportation Needs: Not on file  Physical Activity: Not on file  Stress: Not on file  Social Connections: Not on file   Past Surgical History:  Procedure Laterality Date   ABDOMINAL HYSTERECTOMY     CHOLECYSTECTOMY     ENDOVASCULAR REPAIR/STENT GRAFT N/A 06/12/2020   Procedure: ENDOVASCULAR REPAIR/STENT GRAFT;  Surgeon: Renford Dills, MD;  Location: [facility] INVASIVE CV LAB;  Service: Cardiovascular;  Laterality: N/A;   History reviewed. No pertinent past medical history. BP (!) 164/79   Pulse (!) 56   Ht 5\' 6"  (1.676 m)   Wt 164 lb 9.6 oz (74.7 kg)   SpO2 98%   BMI 26.57 kg/m   Opioid Risk Score:   Fall Risk Score:  `1  Depression screen [facility] 2/9     09/25/2021    1:04 PM 09/25/2021    1:01 PM  Depression screen PHQ 2/9  Decreased Interest 0 0  Down, Depressed, Hopeless 0 0  PHQ - 2 Score 0 0  Altered sleeping 0   Tired, decreased energy 0   Change in appetite 0   Feeling bad or failure about yourself  0   Trouble concentrating 0   Moving slowly or fidgety/restless 0   Suicidal thoughts 0   PHQ-9 Score 0   Difficult doing work/chores Not difficult at all      Review of Systems  All other systems reviewed and are negative.     Objective:   Physical Exam Vitals and nursing note reviewed.  Constitutional:      Appearance: Normal appearance.  Cardiovascular:     Rate and Rhythm: Normal rate and regular rhythm.     Pulses: Normal pulses.     Heart sounds: Normal heart sounds.  Pulmonary:     Effort: Pulmonary effort is normal.     Breath sounds: Normal breath sounds.   Musculoskeletal:     Cervical back: Normal range of motion and neck supple.     Right lower leg: Edema present.     Left lower leg: Edema present.     Comments: Normal Muscle Bulk and Muscle Testing Reveals:  Upper Extremities: Full ROM and Muscle Strength 5/5 Lower Extremities: Full ROM and Muscle Strength 5/5 Wearing Left AFO  Arrived in wheelchair     Skin:    General: Skin is warm and dry.  Neurological:     Mental Status: She is alert and oriented to person, place, and time.  Psychiatric:        Mood and Affect: Mood normal.        Behavior: Behavior normal.         Assessment & Plan:  CVA of Right Basal Ganglia: She was instructed to call her PCP fpr referral for Neurology, she wants a neurologist in [place]. She verbalizes understanding.   Arrived with Uncontrolled Hypertension/ Essential Hypertension: She didn't take her medication today she reports, she took her medication while in the office. She refused ED evaluation, she was instructed to keep blood pressure log  and to F/U with her PCP. She verbalizes understanding.  Hyperlipidemia:. Continue current medication regimen. PCP Following.   F/U with Dr Wynn Banker in 4- 6 weeks .

## 2021-09-25 NOTE — Therapy (Signed)
OUTPATIENT OCCUPATIONAL THERAPY NEURO DISCHARGE  Patient Name: Susan Farrell MRN: [id number] DOB:[birth year], 76 y.o., female Today's Date: 09/25/2021  PCP: Dr. Tracie Farrell REFERRING PROVIDER: Lauraine Rinne, [place]   OT End of Session - 09/25/21 1525     Visit Number 2    Number of Visits 12    Date for OT Re-Evaluation 10/30/21    Progress Note Due on Visit 10    OT Start Time 0928    OT Stop Time 1003    OT Time Calculation (min) 35 min    Activity Tolerance Patient tolerated treatment well    Behavior During Therapy [facility] for tasks assessed/performed              No past medical history on file. Past Surgical History:  Procedure Laterality Date   ABDOMINAL HYSTERECTOMY     CHOLECYSTECTOMY     ENDOVASCULAR REPAIR/STENT GRAFT N/A 06/12/2020   Procedure: ENDOVASCULAR REPAIR/STENT GRAFT;  Surgeon: Katha Cabal, MD;  Location: [place] [facility];  Service: Cardiovascular;  Laterality: N/A;   Patient Active Problem List   Diagnosis Date Noted   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 ([place]) 09/02/2021   Hypertensive urgency 08/29/2021   Dyslipidemia 08/29/2021   Hypothyroidism 08/29/2021   CVA (cerebral vascular accident) ([place]) 08/28/2021   Ruptured abdominal aortic aneurysm (AAA) ([place]) 06/12/2020   Anemia 09/13/2019   Goiter 09/13/2019   Osteopenia 09/13/2019    ONSET DATE: 08/28/21  REFERRING DIAG: CVA  THERAPY DIAG:  Muscle weakness (generalized)  Other lack of coordination  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 ([place])  Rationale for Evaluation and Treatment Rehabilitation  SUBJECTIVE:   SUBJECTIVE STATEMENT: "I've been working on my arm.  I've been using it." Pt accompanied by: self  PERTINENT HISTORY: Per chart, Susan Farrell is a 76 y.o. right-handed female with history of discoid lupus, hypertension, hyperlipidemia, hypothyroidism, abdominal aortic aneurysm with repair tobacco use.  Per chart review lives  with spouse independent prior to admission.  Presented to [facility] 08/28/2021 with acute onset of left-sided weakness.  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    PRECAUTIONS: Fall  WEIGHT BEARING RESTRICTIONS No  PAIN:  Are you having pain? No  FALLS: Has patient fallen in last 6 months? Yes. Number of falls 1 (slipped on towel in the hospital, no injury)  LIVING ENVIRONMENT: Lives with: lives with their spouse Lives in: 1 level house Stairs: Yes: External: 1 steps; none Has following equipment at home: Gilford Rile - 2 wheeled and [employer] - 4 wheeled; pt states she's using her 4 wheeled walker primarily as this allows her to get around more quickly.  PLOF: Independent with all aspects of self care and mobility.  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 Leisure: Going to church and bible study, going on a car ride or a walk  PATIENT GOALS "Walk normal" (without a device)  OBJECTIVE:   HAND DOMINANCE: Right  FUNCTIONAL OUTCOME MEASURES: Eval: FOTO: 88 ; predicted: 96  UPPER EXTREMITY ROM : BUEs WNL   UPPER EXTREMITY MMT:     MMT Right eval Left eval  Shoulder flexion 5 5  Shoulder abduction 5 5  Shoulder adduction    Shoulder extension    Shoulder internal rotation    Shoulder external rotation    Middle trapezius    Lower trapezius    Elbow flexion 5 5  Elbow extension 5 5  Wrist  flexion 5 5  Wrist extension 5 5  Wrist ulnar deviation    Wrist radial deviation    Wrist pronation    Wrist supination    (Blank rows = not tested)  HAND FUNCTION: Grip strength: Right: 62 lbs; Left: 55 lbs, Lateral pinch: Right: 17 lbs, Left: 12 lbs, and 3 point pinch: Right: 15 lbs, Left: 11 lbs  COORDINATION: Finger Nose Finger test: WNL LUE  9 Hole Peg test: Right: 26 sec; Left: 30 sec  SENSATION: WFL  OBSERVATIONS: Pt presents with increased motor control throughout the LUE (both [facility] and [place]) as demonstrated by  good ability to pick up small objects from table top and steady reaching toward a target.  Pt reports she's been consistently working on use of her L arm at home since her OT eval, using her theraband, theraputty, and using her LUE to reach for ADL supplies, and pt reports "it feels normal."  TODAY'S TREATMENT:     Neuro re-ed: Pt worked with small pegs, using L hand to pick up pegs from table top and placed into pegboard which was positioned on an incline.  Pt completed this task in standing for building standing tolerance for ADL/IADL tasks.      Therapeutic Exercise: Objective measures taken and goals updated for discharge.  Issued theraputty handout and instructed pt in gross grasping, tip, lateral, and 3 point pinching, digit abd/add, and digging coins out of putty; good return demo with use of handout.  PATIENT EDUCATION: Education details: HEP Person educated: Patient Education method: Merchandiser, retail cues, demo Education comprehension: verbalized understanding/demonstrated understanding   HOME EXERCISE PROGRAM: Theraputty, functional reaching activities, coin manipulation skills    GOALS: Goals reviewed with patient? Yes  SHORT TERM GOALS: Target date: 10/05/21  Pt will be indep to perform HEP for improving L hand strength and coordination throughout LUE. Baseline: Eval: initiated, will continue to progress Goal status: INITIAL   LONG TERM GOALS: Target date: 11/02/21  Pt will increase FOTO score by 5 points or more to indicate pt perceived improvement in functional performance with daily tasks.. Baseline: Eval: 88; 09/25/21: 82 Goal status: not met (though pt reports she's using her L arm better; pt also considered her decreased balance which PT will be addressing)  2.  Pt will improve L hand dexterity skills for better manipulation of smaller ADL supplies (improve L 9 hole by 5 or more seconds) Baseline: Eval: L 9 hole 37s, R 26s; 09/25/21: L 30s Goal status:  achieved  3.  Pt will be able to put away dishes in overhead cabinet using LUE with reported confidence with accuracy and control to reach toward a target.   Baseline: Eval: pt would use the R d/t mild LUE ataxia (but has not attempted d/t limited standing); 09/25/21: Pt can use LUE to reach into overhead cabinets without difficulty/good control Goal status: achieved  4.  Pt will increase standing tolerance to enable washing a sink full of dishes with 1 or no sitting breaks.  Baseline: Eval: spouse washing dishes d/t pt with limited standing tolerance; 09/25/21: able to stand without rest breaks to wash dishes Goal status: achieved  5.  Pt will increase L hand strength for better manipulation and stabilization of ADL supplies. Baseline: Eval: L grip 54 (R 62); L lateral pinch 12 (R 17); L 3 point pinch 11 (R 15); 09/25/21: L grip 55, lateral and 3 point pinch same as eval but pt reports she uses her putty daily and will  continue to work on these exercises Goal status: partially met    ASSESSMENT:  CLINICAL IMPRESSION: Pt arrived today, reporting consistent use of LUE at home with daily tasks, feeling more confident to reach overhead for ADL supplies with LUE, demos good accuracy/precision with reaching toward a target, and improved ability to pick up smaller items from table top.  Pt's 9 hole peg test score improved by 7 sec since last week.  L hand strength measures mostly unchanged, but nearing baseline, and pt has been consistent with and demonstrates indep with HEP.  Theraputty handout issued today and pt has handout for theraband exercises which she has been consistently using at home.  Pt reports her standing tolerance has improved since last week and she can now stand to wash dishes without issue.  Pt feels confident to continue LUE exercises in the home.  FOTO score decreased from eval but pt considered balance deficits in her ability to perform daily tasks when answering these questions, and  does report improved use of LUE.  No additional skilled OT indicated at this time.  Pt in agreement with plan.  PERFORMANCE DEFICITS in functional skills including ADLs, IADLs, coordination, dexterity, strength, FMC, GMC, mobility, balance, endurance, and UE functional use. IMPAIRMENTS are limiting patient from ADLs, IADLs, and leisure.   COMORBIDITIES may have co-morbidities  that affects occupational performance. Patient will benefit from skilled OT to address above impairments and improve overall function.  MODIFICATION OR ASSISTANCE TO COMPLETE EVALUATION: No modification of tasks or assist necessary to complete an evaluation.  OT OCCUPATIONAL PROFILE AND HISTORY: Problem focused assessment: Including review of records relating to presenting problem.  CLINICAL DECISION MAKING: Moderate - several treatment options, min-mod task modification necessary  REHAB POTENTIAL: Good  EVALUATION COMPLEXITY: Moderate    PLAN: OT FREQUENCY: 1-2x/week  OT DURATION: 6 weeks  PLANNED INTERVENTIONS: self care/ADL training, therapeutic exercise, therapeutic activity, neuromuscular re-education, balance training, functional mobility training, patient/family education, and DME and/or AE instructions  RECOMMENDED OTHER SERVICES: none  CONSULTED AND AGREED WITH PLAN OF CARE: Patient  PLAN FOR NEXT SESSION: N/A; discharge this visit  Leta Speller, MS, OTR/L  Darleene Cleaver, OT 09/25/2021, 3:29 PM

## 2021-09-26 ENCOUNTER — Telehealth: Payer: Self-pay

## 2021-09-26 NOTE — Telephone Encounter (Signed)
Susan Farrell called to report her home blood pressure readings. After her visit here yesterday the readings  were: Left arm 165/82, Right arm 154/82. Call back phone [phone].

## 2021-09-27 DIAGNOSIS — R7309 Other abnormal glucose: Secondary | ICD-10-CM | POA: Diagnosis not present

## 2021-09-27 DIAGNOSIS — G8194 Hemiplegia, unspecified affecting left nondominant side: Secondary | ICD-10-CM | POA: Diagnosis not present

## 2021-09-27 DIAGNOSIS — I119 Hypertensive heart disease without heart failure: Secondary | ICD-10-CM | POA: Diagnosis not present

## 2021-09-27 DIAGNOSIS — D649 Anemia, unspecified: Secondary | ICD-10-CM | POA: Diagnosis not present

## 2021-10-01 ENCOUNTER — Ambulatory Visit: Payer: Medicare HMO

## 2021-10-01 ENCOUNTER — Encounter: Payer: Medicare HMO | Admitting: Occupational Therapy

## 2021-10-01 DIAGNOSIS — R278 Other lack of coordination: Secondary | ICD-10-CM | POA: Diagnosis not present

## 2021-10-01 DIAGNOSIS — M6281 Muscle weakness (generalized): Secondary | ICD-10-CM | POA: Diagnosis not present

## 2021-10-01 DIAGNOSIS — R262 Difficulty in walking, not elsewhere classified: Secondary | ICD-10-CM | POA: Diagnosis not present

## 2021-10-01 DIAGNOSIS — R269 Unspecified abnormalities of gait and mobility: Secondary | ICD-10-CM | POA: Diagnosis not present

## 2021-10-01 DIAGNOSIS — I6381 Other cerebral infarction due to occlusion or stenosis of small artery: Secondary | ICD-10-CM | POA: Diagnosis not present

## 2021-10-01 NOTE — Therapy (Signed)
OUTPATIENT PHYSICAL THERAPY NEURO TREATMENT   Patient Name: Susan Farrell MRN: [id number] DOB:01/16/1946, 76 y.o., female Today's Date: 10/01/2021   PCP: Tracie Harrier, MD REFERRING PROVIDER: Cathlyn Parsons, PA-C   PT End of Session - 10/01/21 0929     Visit Number 2    Number of Visits 24    Date for PT Re-Evaluation 12/11/21    Progress Note Due on Visit 10    PT Start Time 0930    PT Stop Time 1015    PT Time Calculation (min) 45 min    Equipment Utilized During Treatment Gait belt    Activity Tolerance Patient tolerated treatment well    Behavior During Therapy [facility] for tasks assessed/performed              No past medical history on file. Past Surgical History:  Procedure Laterality Date   ABDOMINAL HYSTERECTOMY     CHOLECYSTECTOMY     ENDOVASCULAR REPAIR/STENT GRAFT N/A 06/12/2020   Procedure: ENDOVASCULAR REPAIR/STENT GRAFT;  Surgeon: Katha Cabal, MD;  Location: [place] [facility];  Service: Cardiovascular;  Laterality: N/A;   Patient Active Problem List   Diagnosis Date Noted   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 ([place]) 09/02/2021   Hypertensive urgency 08/29/2021   Dyslipidemia 08/29/2021   Hypothyroidism 08/29/2021   CVA (cerebral vascular accident) ([place]) 08/28/2021   Ruptured abdominal aortic aneurysm (AAA) ([place]) 06/12/2020   Anemia 09/13/2019   Goiter 09/13/2019   Osteopenia 09/13/2019    ONSET DATE: 08/28/21  REFERRING DIAG: I63.81 (ICD-10-CM) - 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 ([place])  THERAPY DIAG:  Difficulty in walking, not elsewhere classified  Muscle weakness (generalized)  Abnormality of gait and mobility  Rationale for Evaluation and Treatment Rehabilitation  SUBJECTIVE:                                                                                                                                                                                              SUBJECTIVE  STATEMENT: Pt denies any falls and states that she is feeling fine.  Pt states she has been doing her exercises all the time at home.  Pt notes she is able to do all the exercises and only has difficulty with the SLR.  She also notes that she walks with a cane at home and stays constantly moving.      PERTINENT HISTORY:  Per Brief HPI from d/c 8/14: Susan Farrell is a 76 y.o. right-handed female with history of discoid lupus, hypertension, hyperlipidemia, hypothyroidism, abdominal aortic aneurysm with repair tobacco use.  Per chart review lives with  spouse independent prior to admission. Presented to [facility] 08/28/2021 with acute onset of left-sided weakness. 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  Underlying age-related cerebral atrophy with mild chronic small vessel changes.  CT angiogram head and neck showed internal carotid and vertebral arteries to be patent.  No intracranial large vessel occlusion or stenosis.   She did sustain a fall 08/30/2021 when attempting to get out of bed without assistance and no injury sustained.  Therapy evaluations completed due to patient decreased functional mobility was admitted for a comprehensive rehab program.  PAIN:  Are you having pain? No  PRECAUTIONS: Fall  WEIGHT BEARING RESTRICTIONS No  FALLS: Has patient fallen in last 6 months? Yes. Number of falls 1, slipped on towel in hospital and ended up in floor.   LIVING ENVIRONMENT: Lives with: lives with their family and lives with their partner Lives in: House/apartment Stairs: Yes: External: 1 steps; none Has following equipment at home: Dan Humphreys - 2 wheeled, Environmental consultant - 4 wheeled, bed side commode, and only uses bed side commode at night   PLOF: Independent, Independent with basic ADLs, Independent with household mobility without device, Independent with community mobility without device, Independent with gait, and Leisure: going to church, going to grocery and  department stores.   PATIENT GOALS Get back to normal walking and balance without any support.   OBJECTIVE:   FUNCTIONAL TESTs:  5 times sit to stand: 18.02 sec 22 6 minute walk test: 234 feet and requires seated rest break due to lower extremity fatigue on the left lower extremity  Berg Balance Scale: 36  PATIENT SURVEYS:  FOTO 65  TODAY'S TREATMENT:   TherEx:  Gait around the gym with SPC in R hand to improve mobility with increased degree of freedom as pt has been walking with rollator mostly, CGA applied for safety, 160'  Gait around the gym with no AD to improve mobility with increased degree of freedom, CGA applied for safety, 160'  Seated marches with 1.5# AW donned B LE, 2x15 Seated LAQ with 1.5# AW donned B LE, 2x15  Hooklying bridges with toes up to increase glute activation, 2x15  Standing hip abduction with 1.5# AW donned, x15 each LE Standing hip extension with 1.5# AW donned, x15 each LE Standing hamstring curl with 1.5# AW donned, x15 each LE  Sidelying clamshells, 2x15 B LE Sidelying hip abduction, 2x15 B LE  Prone hamstring curls, 2x15 B LE increased difficulty in the L LE Prone hip extension, 2x15 B LE with significant difficulty in the L LE  Given updated HEP in order to promote glute activation, and generalized strengthening of the L LE for more normalized gait pattern.    PATIENT EDUCATION: Education details: POC Person educated: Patient Education method: Explanation Education comprehension: verbalized understanding   HOME EXERCISE PROGRAM:  Access Code: [email or web address] URL: [email or web address] Date: 10/01/2021 Prepared by: Tomasa Hose  Exercises - Supine Active Straight Leg Raise  - 1 x daily - 7 x weekly - 3 sets - 10 reps - Sidelying Hip Abduction  - 1 x daily - 7 x weekly - 3 sets - 10 reps - Supine Bridge  - 1 x daily - 7 x weekly - 3 sets - 10 reps - Clamshell  - 1 x daily - 7 x weekly - 3 sets - 10 reps - Prone Hip  Extension with Plantarflexion  - 1 x daily - 7 x weekly - 3 sets - 10 reps  GOALS:  Goals reviewed with patient? Yes  SHORT TERM GOALS: Target date: 10/16/2021       Patient will be independent in home exercise program to improve strength/mobility for better functional independence with ADLs. Baseline: No HEP currently  Goal status: INITIAL  LONG TERM GOALS: Target date: 12/11/2021  1.  Patient (> 38 years old) will complete five times sit to stand test in < 15 seconds indicating an increased LE strength and improved balance. Baseline: 18 sec  Goal status: INITIAL   2.  Patient will increase FOTO score to equal to or greater than  77   to demonstrate statistically significant improvement in mobility and quality of life.  Baseline: 65 Goal status: INITIAL    3.  Patient will increase Berg Balance score by > 6 points to demonstrate decreased fall risk during functional activities. Baseline: 36 Goal status: INITIAL    4.  Patient will increase 10 meter walk test to >1.53m/s as to improve gait speed for better community ambulation and to reduce fall risk. Baseline: .12m/s Goal status: INITIAL   5.  Patient will increase six minute walk test distance to >1000 for progression to community ambulator and improve gait ability Baseline: 234 ft Goal status: INITIAL    ASSESSMENT:  Pt performed well with strengthening exercises given to her as part of treatment and HEP.  Pt has significant weakness in glute musculature of the L LE and was target for majority of exercises.  Pt demonstrated increased musculature contraction towards end of session, however still has trace movements with prone exercises against gravity.  Pt will continue to benefit from skilled therapy in order to improve overall strengthening of the LE's to improve balance and reduce overall risk of falls.    OBJECTIVE IMPAIRMENTS Abnormal gait, decreased activity tolerance, decreased balance, decreased  coordination, decreased endurance, decreased mobility, difficulty walking, and decreased strength.   ACTIVITY LIMITATIONS lifting, bending, standing, squatting, stairs, dressing, and caring for others  PARTICIPATION LIMITATIONS: meal prep, cleaning, driving, shopping, yard work, and church  PERSONAL FACTORS Age are also affecting patient's functional outcome.   REHAB POTENTIAL: Good  CLINICAL DECISION MAKING: Evolving/moderate complexity  EVALUATION COMPLEXITY: Low  PLAN: PT FREQUENCY: 2x/week  PT DURATION: 12 weeks  PLANNED INTERVENTIONS: Therapeutic exercises, Therapeutic activity, Neuromuscular re-education, Balance training, Gait training, Patient/Family education, Self Care, Joint mobilization, Dry Needling, and Manual therapy  PLAN FOR NEXT SESSION: begin POC, HEP   Nolon Bussing, PT, DPT 10/01/21, 3:15 PM

## 2021-10-03 DIAGNOSIS — Z8679 Personal history of other diseases of the circulatory system: Secondary | ICD-10-CM | POA: Diagnosis not present

## 2021-10-03 DIAGNOSIS — M25512 Pain in left shoulder: Secondary | ICD-10-CM | POA: Diagnosis not present

## 2021-10-03 DIAGNOSIS — F17210 Nicotine dependence, cigarettes, uncomplicated: Secondary | ICD-10-CM | POA: Diagnosis not present

## 2021-10-03 DIAGNOSIS — Z8673 Personal history of transient ischemic attack (TIA), and cerebral infarction without residual deficits: Secondary | ICD-10-CM | POA: Diagnosis not present

## 2021-10-04 ENCOUNTER — Ambulatory Visit: Payer: Medicare HMO | Admitting: Physical Therapy

## 2021-10-04 DIAGNOSIS — M6281 Muscle weakness (generalized): Secondary | ICD-10-CM | POA: Diagnosis not present

## 2021-10-04 DIAGNOSIS — R262 Difficulty in walking, not elsewhere classified: Secondary | ICD-10-CM | POA: Diagnosis not present

## 2021-10-04 DIAGNOSIS — R278 Other lack of coordination: Secondary | ICD-10-CM | POA: Diagnosis not present

## 2021-10-04 DIAGNOSIS — I6381 Other cerebral infarction due to occlusion or stenosis of small artery: Secondary | ICD-10-CM | POA: Diagnosis not present

## 2021-10-04 DIAGNOSIS — R269 Unspecified abnormalities of gait and mobility: Secondary | ICD-10-CM

## 2021-10-04 NOTE — Therapy (Signed)
OUTPATIENT PHYSICAL THERAPY NEURO TREATMENT   Patient Name: Susan Farrell MRN: [id number] DOB:03/08/1945, 76 y.o., female Today's Date: 10/04/2021   PCP: Barbette Reichmann, MD REFERRING PROVIDER: Charlton Amor, PA-C   PT End of Session - 10/04/21 0753     Visit Number 3    Number of Visits 24    Date for PT Re-Evaluation 12/11/21    Progress Note Due on Visit 10    PT Start Time 0801    PT Stop Time 0845    PT Time Calculation (min) 44 min    Equipment Utilized During Treatment Gait belt    Activity Tolerance Patient tolerated treatment well    Behavior During Therapy [facility] for tasks assessed/performed              No past medical history on file. Past Surgical History:  Procedure Laterality Date   ABDOMINAL HYSTERECTOMY     CHOLECYSTECTOMY     ENDOVASCULAR REPAIR/STENT GRAFT N/A 06/12/2020   Procedure: ENDOVASCULAR REPAIR/STENT GRAFT;  Surgeon: Renford Dills, MD;  Location: [facility] INVASIVE CV LAB;  Service: Cardiovascular;  Laterality: N/A;   Patient Active Problem List   Diagnosis Date Noted   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) 09/02/2021   Hypertensive urgency 08/29/2021   Dyslipidemia 08/29/2021   Hypothyroidism 08/29/2021   CVA (cerebral vascular accident) (HCC) 08/28/2021   Ruptured abdominal aortic aneurysm (AAA) (HCC) 06/12/2020   Anemia 09/13/2019   Goiter 09/13/2019   Osteopenia 09/13/2019    ONSET DATE: 08/28/21  REFERRING DIAG: I63.81 (ICD-10-CM) - 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)  THERAPY DIAG:  Difficulty in walking, not elsewhere classified  Muscle weakness (generalized)  Abnormality of gait and mobility  Rationale for Evaluation and Treatment Rehabilitation  SUBJECTIVE:                                                                                                                                                                                              SUBJECTIVE  STATEMENT: Pt reports doing well at this time and has been walking at home with her cane. Pt reports supine SLR has been most difficult and has not been able to complete properly.     PERTINENT HISTORY:  Per Brief HPI from d/c 8/14: NASHALI DITMER is a 76 y.o. right-handed female with history of discoid lupus, hypertension, hyperlipidemia, hypothyroidism, abdominal aortic aneurysm with repair tobacco use.  Per chart review lives with spouse independent prior to admission. Presented to [facility] 08/28/2021 with acute onset of left-sided weakness. 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  Underlying age-related cerebral atrophy with mild chronic small vessel changes.  CT angiogram head and neck showed internal carotid and vertebral arteries to be patent.  No intracranial large vessel occlusion or stenosis.   She did sustain a fall 08/30/2021 when attempting to get out of bed without assistance and no injury sustained.  Therapy evaluations completed due to patient decreased functional mobility was admitted for a comprehensive rehab program.  PAIN:  Are you having pain? No  PRECAUTIONS: Fall  WEIGHT BEARING RESTRICTIONS No  FALLS: Has patient fallen in last 6 months? Yes. Number of falls 1, slipped on towel in hospital and ended up in floor.   LIVING ENVIRONMENT: Lives with: lives with their family and lives with their partner Lives in: House/apartment Stairs: Yes: External: 1 steps; none Has following equipment at home: Dan Humphreys - 2 wheeled, Environmental consultant - 4 wheeled, bed side commode, and only uses bed side commode at night   PLOF: Independent, Independent with basic ADLs, Independent with household mobility without device, Independent with community mobility without device, Independent with gait, and Leisure: going to church, going to grocery and department stores.   PATIENT GOALS Get back to normal walking and balance without any support.   OBJECTIVE:    FUNCTIONAL TESTs:  5 times sit to stand: 18.02 sec 22 6 minute walk test: 234 feet and requires seated rest break due to lower extremity fatigue on the left lower extremity  Berg Balance Scale: 36  PATIENT SURVEYS:  FOTO 65  TODAY'S TREATMENT:   TherEx:  Nustep interval training 2 min level 2  1 min lvl 4 1 min lev 2  1 min lvl5 - seat 8 and with UE utilization throughout  Seated LAQ with 1.5# AW donned Band with adduction ball squeeze, no weight on L LE and prolonged hold at end range extension. Started with 2# AW but pt unable to obtain complete ROM on the left   Standing hip extension with UE support 2 x 10 ea LE with 2# Aw donned   Forward marching followed by retro ambulation with 2# Aw donned, focus on large movements with involved LE.   Gait around the gym with [facility] in R hand to improve mobility with increased degree of freedom as pt has been walking with rollator mostly, CGA applied for safety, 160'  Standing hip abduction with 2# AW donned, x2*10 each LE Standing hamstring curl with 1.5# AW donned, x15 each LE  Gait around the gym with no AD to improve mobility with increased degree of freedom, CGA applied for safety, 160' x 2 rounds, improved foot drag on second round   Step up to 6 inch step plus oval Thera-Band pad with external cue for proper foot placement.  Completed 15 reps, patient had difficulty with proper foot placement but did improve with practice.  Patient also unable to perform proper hip extension technique upon return to neutral position requires some circumduction of the hip or some of these repetitions.  Given updated HEP in order to promote glute activation, and generalized strengthening of the L LE for more normalized gait pattern.    PATIENT EDUCATION: Education details: POC Person educated: Patient Education method: Explanation Education comprehension: verbalized understanding   HOME EXERCISE PROGRAM:  Access Code: [email or web address] URL:  [email or web address] Date: 10/01/2021 Prepared by: Tomasa Hose  Exercises - Supine Active Straight Leg Raise  - 1 x daily - 7 x weekly - 3 sets - 10 reps - Sidelying Hip Abduction  - 1 x daily -  7 x weekly - 3 sets - 10 reps - Supine Bridge  - 1 x daily - 7 x weekly - 3 sets - 10 reps - Clamshell  - 1 x daily - 7 x weekly - 3 sets - 10 reps -standing hip extension  - 1 x daily - 7 x weekly - 3 sets - 10 reps    GOALS:  Goals reviewed with patient? Yes  SHORT TERM GOALS: Target date: 10/16/2021       Patient will be independent in home exercise program to improve strength/mobility for better functional independence with ADLs. Baseline: No HEP currently  Goal status: INITIAL  LONG TERM GOALS: Target date: 12/11/2021  1.  Patient (> 75 years old) will complete five times sit to stand test in < 15 seconds indicating an increased LE strength and improved balance. Baseline: 18 sec  Goal status: INITIAL   2.  Patient will increase FOTO score to equal to or greater than  77   to demonstrate statistically significant improvement in mobility and quality of life.  Baseline: 65 Goal status: INITIAL    3.  Patient will increase Berg Balance score by > 6 points to demonstrate decreased fall risk during functional activities. Baseline: 36 Goal status: INITIAL    4.  Patient will increase 10 meter walk test to >1.68m/s as to improve gait speed for better community ambulation and to reduce fall risk. Baseline: .15m/s Goal status: INITIAL   5.  Patient will increase six minute walk test distance to >1000 for progression to community ambulator and improve gait ability Baseline: 234 ft Goal status: INITIAL    ASSESSMENT:  Continued with current plan of care as laid out in evaluation and recent prior sessions. Pt remains motivated to advance progress toward goals in order to maximize independence and safety at home. Pt requires high level assistance and cuing for  completion of exercises in order to provide adequate level of stimulation and perturbation. Author allows pt as much opportunity as possible to perform independent righting strategies, only stepping in when pt is unable to prevent falling to floor.  Physical therapist contacted home exercise program to improve patient's ability to complete properly.  Patient also progresses with ambulation as well as with left lower extremity strength, coordination, and neuromuscular control exercises this date.  Pt closely monitored throughout session for safe vitals response and to maximize patient safety during interventions. Pt continues to demonstrate progress toward goals AEB progression of some interventions this date either in volume or intensity.     OBJECTIVE IMPAIRMENTS Abnormal gait, decreased activity tolerance, decreased balance, decreased coordination, decreased endurance, decreased mobility, difficulty walking, and decreased strength.   ACTIVITY LIMITATIONS lifting, bending, standing, squatting, stairs, dressing, and caring for others  PARTICIPATION LIMITATIONS: meal prep, cleaning, driving, shopping, yard work, and church  PERSONAL FACTORS Age are also affecting patient's functional outcome.   REHAB POTENTIAL: Good  CLINICAL DECISION MAKING: Evolving/moderate complexity  EVALUATION COMPLEXITY: Low  PLAN: PT FREQUENCY: 2x/week  PT DURATION: 12 weeks  PLANNED INTERVENTIONS: Therapeutic exercises, Therapeutic activity, Neuromuscular re-education, Balance training, Gait training, Patient/Family education, Self Care, Joint mobilization, Dry Needling, and Manual therapy  PLAN FOR NEXT SESSION: begin POC, HEP   Norman Herrlich PT  10/04/21, 7:54 AM

## 2021-10-09 ENCOUNTER — Ambulatory Visit: Payer: Medicare HMO

## 2021-10-09 DIAGNOSIS — M6281 Muscle weakness (generalized): Secondary | ICD-10-CM

## 2021-10-09 DIAGNOSIS — R262 Difficulty in walking, not elsewhere classified: Secondary | ICD-10-CM | POA: Diagnosis not present

## 2021-10-09 DIAGNOSIS — I6381 Other cerebral infarction due to occlusion or stenosis of small artery: Secondary | ICD-10-CM | POA: Diagnosis not present

## 2021-10-09 DIAGNOSIS — R269 Unspecified abnormalities of gait and mobility: Secondary | ICD-10-CM | POA: Diagnosis not present

## 2021-10-09 DIAGNOSIS — R278 Other lack of coordination: Secondary | ICD-10-CM | POA: Diagnosis not present

## 2021-10-09 NOTE — Therapy (Signed)
OUTPATIENT PHYSICAL THERAPY NEURO TREATMENT   Patient Name: Susan Farrell MRN: [id number] DOB:31-Mar-1945, 76 y.o., female Today's Date: 10/09/2021   PCP: Barbette Reichmann, MD REFERRING PROVIDER: Charlton Amor, PA-C   PT End of Session - 10/09/21 0804     Visit Number 4    Number of Visits 24    Date for PT Re-Evaluation 12/11/21    Progress Note Due on Visit 10    PT Start Time 0804    PT Stop Time 0846    PT Time Calculation (min) 42 min    Equipment Utilized During Treatment Gait belt    Activity Tolerance Patient tolerated treatment well    Behavior During Therapy [facility] for tasks assessed/performed              No past medical history on file. Past Surgical History:  Procedure Laterality Date   ABDOMINAL HYSTERECTOMY     CHOLECYSTECTOMY     ENDOVASCULAR REPAIR/STENT GRAFT N/A 06/12/2020   Procedure: ENDOVASCULAR REPAIR/STENT GRAFT;  Surgeon: Renford Dills, MD;  Location: [facility] INVASIVE CV LAB;  Service: Cardiovascular;  Laterality: N/A;   Patient Active Problem List   Diagnosis Date Noted   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) 09/02/2021   Hypertensive urgency 08/29/2021   Dyslipidemia 08/29/2021   Hypothyroidism 08/29/2021   CVA (cerebral vascular accident) (HCC) 08/28/2021   Ruptured abdominal aortic aneurysm (AAA) (HCC) 06/12/2020   Anemia 09/13/2019   Goiter 09/13/2019   Osteopenia 09/13/2019    ONSET DATE: 08/28/21  REFERRING DIAG: I63.81 (ICD-10-CM) - 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)  THERAPY DIAG:  Difficulty in walking, not elsewhere classified  Muscle weakness (generalized)  Abnormality of gait and mobility  Rationale for Evaluation and Treatment Rehabilitation  SUBJECTIVE:                                                                                                                                                                                              SUBJECTIVE  STATEMENT:  Pt states she was able to go to the grocery store and use the shopping cart for support.  Pt notes she was able to get everything for herself and navigate around the grocery store without any issues.  Pt states she was not fatigued afterwards.    PERTINENT HISTORY:  Per Brief HPI from d/c 8/14: Susan Farrell is a 76 y.o. right-handed female with history of discoid lupus, hypertension, hyperlipidemia, hypothyroidism, abdominal aortic aneurysm with repair tobacco use.  Per chart review lives with spouse independent prior to admission. Presented to [facility] 08/28/2021 with acute onset of left-sided  weakness. 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  Underlying age-related cerebral atrophy with mild chronic small vessel changes.  CT angiogram head and neck showed internal carotid and vertebral arteries to be patent.  No intracranial large vessel occlusion or stenosis.   She did sustain a fall 08/30/2021 when attempting to get out of bed without assistance and no injury sustained.  Therapy evaluations completed due to patient decreased functional mobility was admitted for a comprehensive rehab program.  PAIN:  Are you having pain? No  PRECAUTIONS: Fall  WEIGHT BEARING RESTRICTIONS No  FALLS: Has patient fallen in last 6 months? Yes. Number of falls 1, slipped on towel in hospital and ended up in floor.   LIVING ENVIRONMENT: Lives with: lives with their family and lives with their partner Lives in: House/apartment Stairs: Yes: External: 1 steps; none Has following equipment at home: Dan Humphreys - 2 wheeled, Environmental consultant - 4 wheeled, bed side commode, and only uses bed side commode at night   PLOF: Independent, Independent with basic ADLs, Independent with household mobility without device, Independent with community mobility without device, Independent with gait, and Leisure: going to church, going to grocery and department stores.   PATIENT GOALS Get back to  normal walking and balance without any support.   OBJECTIVE:   FUNCTIONAL TESTs:  5 times sit to stand: 18.02 sec 22 6 minute walk test: 234 feet and requires seated rest break due to lower extremity fatigue on the left lower extremity  Berg Balance Scale: 36  PATIENT SURVEYS:  FOTO 65  TODAY'S TREATMENT:   TherEx:  Nustep interval training 2.5 min level 2  1 min lvl 4 1 min lvl 3 1 min lvl 5 - seat 9 and with UE utilization throughout  Seated LAQ with 2# AW donned Band with adduction ball squeeze, prolonged hold at end range extension. Pt seated on 2 airex pads to assist with foot clearance from chair.  Seated hip adduction into yellow physioball, 2x15 with 3 sec holds  Standing hip extension with UE support 2 x 10 ea LE with 2# Aw donned   Forward marching followed by retro ambulation with 2# Aw donned, focus on large movements with involved LE.   Gait around the gym with [facility] in R hand to improve mobility with increased degree of freedom as pt has been walking with rollator mostly, CGA applied for safety, 160'  Standing hip abduction with 2# AW donned, 2x15 each LE  Standing hamstring curl with 2# AW donned, 2x15 each LE pt unable to achieve full Rom with the L LE  Gait around the gym with no AD to improve mobility with increased degree of freedom, CGA applied for safety, 160' x 2 rounds  Forward Step up to 6 inch step x15 each LE leading   Lateral step up to 6 inch step sx15 with L LE leading pt requiring       PATIENT EDUCATION: Education details: POC Person educated: Patient Education method: Explanation Education comprehension: verbalized understanding   HOME EXERCISE PROGRAM:  Access Code: [email or web address] URL: [email or web address] Date: 10/01/2021 Prepared by: Tomasa Hose  Exercises - Supine Active Straight Leg Raise  - 1 x daily - 7 x weekly - 3 sets - 10 reps - Sidelying Hip Abduction  - 1 x daily - 7 x weekly - 3 sets - 10 reps -  Supine Bridge  - 1 x daily - 7 x weekly - 3 sets - 10 reps - Clamshell  -  1 x daily - 7 x weekly - 3 sets - 10 reps -standing hip extension  - 1 x daily - 7 x weekly - 3 sets - 10 reps    GOALS:  Goals reviewed with patient? Yes  SHORT TERM GOALS: Target date: 10/16/2021       Patient will be independent in home exercise program to improve strength/mobility for better functional independence with ADLs. Baseline: No HEP currently  Goal status: INITIAL  LONG TERM GOALS: Target date: 12/11/2021  1.  Patient (> 59 years old) will complete five times sit to stand test in < 15 seconds indicating an increased LE strength and improved balance. Baseline: 18 sec  Goal status: INITIAL   2.  Patient will increase FOTO score to equal to or greater than  77   to demonstrate statistically significant improvement in mobility and quality of life.  Baseline: 65 Goal status: INITIAL    3.  Patient will increase Berg Balance score by > 6 points to demonstrate decreased fall risk during functional activities. Baseline: 36 Goal status: INITIAL    4.  Patient will increase 10 meter walk test to >1.26m/s as to improve gait speed for better community ambulation and to reduce fall risk. Baseline: .30m/s Goal status: INITIAL   5.  Patient will increase six minute walk test distance to >1000 for progression to community ambulator and improve gait ability Baseline: 234 ft Goal status: INITIAL    ASSESSMENT:  Pt is making significant progress and notes that she is even able to cross her L LE over the R LE without any UE support to perform.  She notes she was proud in her ability to do something that she was unable to do previously and understands that therapy is the primary reason for being able to accomplish the feat.  Pt still struggled with hamstring activation against gravity, however is able to perform with seated position and use of theraband for resistance.   Pt will continue to benefit from  skilled therapy to address remaining deficits in order to improve overall QoL and return to PLOF.          OBJECTIVE IMPAIRMENTS Abnormal gait, decreased activity tolerance, decreased balance, decreased coordination, decreased endurance, decreased mobility, difficulty walking, and decreased strength.   ACTIVITY LIMITATIONS lifting, bending, standing, squatting, stairs, dressing, and caring for others  PARTICIPATION LIMITATIONS: meal prep, cleaning, driving, shopping, yard work, and church  PERSONAL FACTORS Age are also affecting patient's functional outcome.   REHAB POTENTIAL: Good  CLINICAL DECISION MAKING: Evolving/moderate complexity  EVALUATION COMPLEXITY: Low  PLAN: PT FREQUENCY: 2x/week  PT DURATION: 12 weeks  PLANNED INTERVENTIONS: Therapeutic exercises, Therapeutic activity, Neuromuscular re-education, Balance training, Gait training, Patient/Family education, Self Care, Joint mobilization, Dry Needling, and Manual therapy  PLAN FOR NEXT SESSION: begin POC, HEP   Gwenlyn Saran, PT, DPT 10/09/21, 9:28 AM

## 2021-10-11 ENCOUNTER — Encounter: Payer: Medicare HMO | Admitting: Occupational Therapy

## 2021-10-11 ENCOUNTER — Telehealth: Payer: Self-pay

## 2021-10-11 DIAGNOSIS — M47812 Spondylosis without myelopathy or radiculopathy, cervical region: Secondary | ICD-10-CM | POA: Diagnosis not present

## 2021-10-11 DIAGNOSIS — L930 Discoid lupus erythematosus: Secondary | ICD-10-CM | POA: Diagnosis not present

## 2021-10-11 DIAGNOSIS — Z8673 Personal history of transient ischemic attack (TIA), and cerebral infarction without residual deficits: Secondary | ICD-10-CM | POA: Diagnosis not present

## 2021-10-11 DIAGNOSIS — I10 Essential (primary) hypertension: Secondary | ICD-10-CM | POA: Diagnosis not present

## 2021-10-11 DIAGNOSIS — E041 Nontoxic single thyroid nodule: Secondary | ICD-10-CM | POA: Diagnosis not present

## 2021-10-11 NOTE — Patient Outreach (Signed)
  Care Coordination   10/11/2021 Name: Susan Farrell MRN: [id number] DOB: 07/10/1945   Care Coordination Outreach Attempts:  An unsuccessful telephone outreach was attempted today to offer the patient information about available care coordination services as a benefit of their health plan.   Follow Up Plan:  Additional outreach attempts will be made to offer the patient care coordination information and services.   Encounter Outcome:  No Answer  Care Coordination Interventions Activated:  No   Care Coordination Interventions:  No, not indicated    Noreene Larsson RN, MSN, [facility]  Mobile: [phone]

## 2021-10-15 ENCOUNTER — Other Ambulatory Visit (HOSPITAL_COMMUNITY): Payer: Self-pay | Admitting: Neurology

## 2021-10-15 ENCOUNTER — Other Ambulatory Visit: Payer: Self-pay | Admitting: Neurology

## 2021-10-15 DIAGNOSIS — M4802 Spinal stenosis, cervical region: Secondary | ICD-10-CM

## 2021-10-15 NOTE — Therapy (Signed)
OUTPATIENT PHYSICAL THERAPY NEURO TREATMENT   Patient Name: Susan Farrell MRN: [id number] DOB:[birth year], 76 y.o., female Today's Date: 10/16/2021   PCP: Barbette Reichmann, MD REFERRING PROVIDER: Charlton Amor, PA-C   PT End of Session - 10/16/21 1111     Visit Number 5    Number of Visits 24    Date for PT Re-Evaluation 12/11/21    Progress Note Due on Visit 10    PT Start Time 1108    PT Stop Time 1146    PT Time Calculation (min) 38 min    Equipment Utilized During Treatment Gait belt    Activity Tolerance Patient tolerated treatment well    Behavior During Therapy WFL for tasks assessed/performed               History reviewed. No pertinent past medical history. Past Surgical History:  Procedure Laterality Date   ABDOMINAL HYSTERECTOMY     CHOLECYSTECTOMY     ENDOVASCULAR REPAIR/STENT GRAFT N/A 06/12/2020   Procedure: ENDOVASCULAR REPAIR/STENT GRAFT;  Surgeon: Renford Dills, MD;  Location: [facility] INVASIVE CV LAB;  Service: Cardiovascular;  Laterality: N/A;   Patient Active Problem List   Diagnosis Date Noted   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) 09/02/2021   Hypertensive urgency 08/29/2021   Dyslipidemia 08/29/2021   Hypothyroidism 08/29/2021   CVA (cerebral vascular accident) (HCC) 08/28/2021   Ruptured abdominal aortic aneurysm (AAA) (HCC) 06/12/2020   Anemia 09/13/2019   Goiter 09/13/2019   Osteopenia 09/13/2019    ONSET DATE: 08/28/21  REFERRING DIAG: I63.81 (ICD-10-CM) - 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)  THERAPY DIAG:  Difficulty in walking, not elsewhere classified  Muscle weakness (generalized)  Abnormality of gait and mobility  Rationale for Evaluation and Treatment Rehabilitation  SUBJECTIVE:                                                                                                                                                                                               SUBJECTIVE STATEMENT:  Patient had a blood pressure appointment prior to PT today. Has been compliant with her HEP.     PERTINENT HISTORY:  Per Brief HPI from d/c 8/14: JHANAE JASKOWIAK is a 76 y.o. right-handed female with history of discoid lupus, hypertension, hyperlipidemia, hypothyroidism, abdominal aortic aneurysm with repair tobacco use.  Per chart review lives with spouse independent prior to admission. Presented to [facility] 08/28/2021 with acute onset of left-sided weakness. 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  Underlying age-related cerebral atrophy with mild chronic small vessel changes.  CT angiogram  head and neck showed internal carotid and vertebral arteries to be patent.  No intracranial large vessel occlusion or stenosis.   She did sustain a fall 08/30/2021 when attempting to get out of bed without assistance and no injury sustained.  Therapy evaluations completed due to patient decreased functional mobility was admitted for a comprehensive rehab program.  PAIN:  Are you having pain? No  PRECAUTIONS: Fall  WEIGHT BEARING RESTRICTIONS No  FALLS: Has patient fallen in last 6 months? Yes. Number of falls 1, slipped on towel in hospital and ended up in floor.   LIVING ENVIRONMENT: Lives with: lives with their family and lives with their partner Lives in: House/apartment Stairs: Yes: External: 1 steps; none Has following equipment at home: Gilford Rile - 2 wheeled, Environmental consultant - 4 wheeled, bed side commode, and only uses bed side commode at night   PLOF: Independent, Independent with basic ADLs, Independent with household mobility without device, Independent with community mobility without device, Independent with gait, and Leisure: going to church, going to grocery and department stores.   PATIENT GOALS Get back to normal walking and balance without any support.   OBJECTIVE:   FUNCTIONAL TESTs:  5 times sit to stand: 18.02 sec 22 6 minute  walk test: 234 feet and requires seated rest break due to lower extremity fatigue on the left lower extremity  Berg Balance Scale: 36  PATIENT SURVEYS:  FOTO 65  TODAY'S TREATMENT:     Nustep interval training 1.5 min level 2  1 min lvl 4 1 min lvl 3 1 min lvl 5 - seat 9 and with UE utilization throughout  In // bars: -orange hurdle: step over and back BUE for LLE, SUE for RLE 10x each LE  Standing with CGA next to support surface:  Airex pad: static stand 30 seconds x 2 trials, noticeable trembling of ankles/LE's with fatigue and challenge to maintain stability Airex pad: horizontal head turns 30 seconds scanning room 10x ; cueing for arc of motion  Airex pad: vertical head turns 30 seconds, cueing for arc of motion, noticeable sway with upward gaze increasing demand on ankle righting reaction musculature Airex pad: one foot on each  airex pad hold position for 30 seconds, switch legs, 2x each LE; Airex pad : marching 10x each LE Airex pad: hedgehog taps 10x each LE  Seated without AFO Dynadisc: -df/pf 10x -inversion/eversion 10x -clockwise circle 10x, counterclockwise 10x      PATIENT EDUCATION: Education details: POC Person educated: Patient Education method: Explanation Education comprehension: verbalized understanding   HOME EXERCISE PROGRAM:  Access Code: [email or web address] URL: [email or web address] Date: 10/01/2021 Prepared by: Rison Nation  Exercises - Supine Active Straight Leg Raise  - 1 x daily - 7 x weekly - 3 sets - 10 reps - Sidelying Hip Abduction  - 1 x daily - 7 x weekly - 3 sets - 10 reps - Supine Bridge  - 1 x daily - 7 x weekly - 3 sets - 10 reps - Clamshell  - 1 x daily - 7 x weekly - 3 sets - 10 reps -standing hip extension  - 1 x daily - 7 x weekly - 3 sets - 10 reps    GOALS:  Goals reviewed with patient? Yes  SHORT TERM GOALS: Target date: 10/16/2021       Patient will be independent in home exercise program to  improve strength/mobility for better functional independence with ADLs. Baseline: No HEP currently  Goal status: INITIAL  LONG TERM GOALS: Target  date: 12/11/2021  1.  Patient (> 65 years old) will complete five times sit to stand test in < 15 seconds indicating an increased LE strength and improved balance. Baseline: 18 sec  Goal status: INITIAL   2.  Patient will increase FOTO score to equal to or greater than  77   to demonstrate statistically significant improvement in mobility and quality of life.  Baseline: 65 Goal status: INITIAL    3.  Patient will increase Berg Balance score by > 6 points to demonstrate decreased fall risk during functional activities. Baseline: 36 Goal status: INITIAL    4.  Patient will increase 10 meter walk test to >1.47m/s as to improve gait speed for better community ambulation and to reduce fall risk. Baseline: .15m/s Goal status: INITIAL   5.  Patient will increase six minute walk test distance to >1000 for progression to community ambulator and improve gait ability Baseline: 234 ft Goal status: INITIAL    ASSESSMENT:  Patient tolerates progressive stabilization interventions in combination with LE strengthening. Ankle strengthening without AFO performed seated with dynadisc with patient challenged with lateral movements.  Patient is highly motivated for progression of care and increased independence.  Pt will continue to benefit from skilled therapy to address remaining deficits in order to improve overall QoL and return to PLOF.          OBJECTIVE IMPAIRMENTS Abnormal gait, decreased activity tolerance, decreased balance, decreased coordination, decreased endurance, decreased mobility, difficulty walking, and decreased strength.   ACTIVITY LIMITATIONS lifting, bending, standing, squatting, stairs, dressing, and caring for others  PARTICIPATION LIMITATIONS: meal prep, cleaning, driving, shopping, yard work, and church  PERSONAL FACTORS  Age are also affecting patient's functional outcome.   REHAB POTENTIAL: Good  CLINICAL DECISION MAKING: Evolving/moderate complexity  EVALUATION COMPLEXITY: Low  PLAN: PT FREQUENCY: 2x/week  PT DURATION: 12 weeks  PLANNED INTERVENTIONS: Therapeutic exercises, Therapeutic activity, Neuromuscular re-education, Balance training, Gait training, Patient/Family education, Self Care, Joint mobilization, Dry Needling, and Manual therapy  PLAN FOR NEXT SESSION: begin POC, HEP  Precious Bard PT  10/16/21, 11:52 AM

## 2021-10-16 ENCOUNTER — Ambulatory Visit: Payer: Medicare HMO

## 2021-10-16 ENCOUNTER — Encounter: Payer: Medicare HMO | Admitting: Occupational Therapy

## 2021-10-16 DIAGNOSIS — R269 Unspecified abnormalities of gait and mobility: Secondary | ICD-10-CM

## 2021-10-16 DIAGNOSIS — M6281 Muscle weakness (generalized): Secondary | ICD-10-CM

## 2021-10-16 DIAGNOSIS — R262 Difficulty in walking, not elsewhere classified: Secondary | ICD-10-CM

## 2021-10-16 DIAGNOSIS — I6381 Other cerebral infarction due to occlusion or stenosis of small artery: Secondary | ICD-10-CM | POA: Diagnosis not present

## 2021-10-16 DIAGNOSIS — R278 Other lack of coordination: Secondary | ICD-10-CM | POA: Diagnosis not present

## 2021-10-17 ENCOUNTER — Encounter: Payer: Medicare HMO | Admitting: Occupational Therapy

## 2021-10-22 ENCOUNTER — Ambulatory Visit
Admission: RE | Admit: 2021-10-22 | Discharge: 2021-10-22 | Disposition: A | Discharge status: Home / Self Care | Payer: Medicare HMO | Source: Ambulatory Visit | Attending: Neurology | Admitting: Neurology

## 2021-10-22 DIAGNOSIS — M4802 Spinal stenosis, cervical region: Secondary | ICD-10-CM | POA: Insufficient documentation

## 2021-10-22 DIAGNOSIS — M25512 Pain in left shoulder: Secondary | ICD-10-CM | POA: Diagnosis not present

## 2021-10-22 DIAGNOSIS — M47812 Spondylosis without myelopathy or radiculopathy, cervical region: Secondary | ICD-10-CM | POA: Diagnosis not present

## 2021-10-22 DIAGNOSIS — M50221 Other cervical disc displacement at C4-C5 level: Secondary | ICD-10-CM | POA: Diagnosis not present

## 2021-10-22 DIAGNOSIS — I10 Essential (primary) hypertension: Secondary | ICD-10-CM | POA: Diagnosis not present

## 2021-10-23 ENCOUNTER — Ambulatory Visit: Payer: Medicare HMO | Attending: Physician Assistant

## 2021-10-23 ENCOUNTER — Encounter: Payer: Medicare HMO | Admitting: Occupational Therapy

## 2021-10-23 DIAGNOSIS — R262 Difficulty in walking, not elsewhere classified: Secondary | ICD-10-CM | POA: Insufficient documentation

## 2021-10-23 DIAGNOSIS — R269 Unspecified abnormalities of gait and mobility: Secondary | ICD-10-CM | POA: Diagnosis not present

## 2021-10-23 DIAGNOSIS — I6381 Other cerebral infarction due to occlusion or stenosis of small artery: Secondary | ICD-10-CM | POA: Diagnosis not present

## 2021-10-23 DIAGNOSIS — M6281 Muscle weakness (generalized): Secondary | ICD-10-CM | POA: Insufficient documentation

## 2021-10-23 DIAGNOSIS — R2689 Other abnormalities of gait and mobility: Secondary | ICD-10-CM | POA: Insufficient documentation

## 2021-10-23 DIAGNOSIS — R278 Other lack of coordination: Secondary | ICD-10-CM | POA: Insufficient documentation

## 2021-10-23 NOTE — Therapy (Signed)
OUTPATIENT PHYSICAL THERAPY NEURO TREATMENT   Patient Name: Susan Farrell MRN: [id number] DOB:October 27, 1945, 76 y.o., female Today's Date: 10/23/2021   PCP: Tracie Harrier, MD REFERRING PROVIDER: Cathlyn Parsons, PA-C   PT End of Session - 10/23/21 1300     Visit Number 6    Number of Visits 24    Date for PT Re-Evaluation 12/11/21    Progress Note Due on Visit 10    PT Start Time 1300    PT Stop Time 1345    PT Time Calculation (min) 45 min    Equipment Utilized During Treatment Gait belt    Activity Tolerance Patient tolerated treatment well    Behavior During Therapy [facility] for tasks assessed/performed               No past medical history on file. Past Surgical History:  Procedure Laterality Date   ABDOMINAL HYSTERECTOMY     CHOLECYSTECTOMY     ENDOVASCULAR REPAIR/STENT GRAFT N/A 06/12/2020   Procedure: ENDOVASCULAR REPAIR/STENT GRAFT;  Surgeon: Katha Cabal, MD;  Location: [place] CV LAB;  Service: Cardiovascular;  Laterality: N/A;   Patient Active Problem List   Diagnosis Date Noted   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 ([place]) 09/02/2021   Hypertensive urgency 08/29/2021   Dyslipidemia 08/29/2021   Hypothyroidism 08/29/2021   CVA (cerebral vascular accident) ([place]) 08/28/2021   Ruptured abdominal aortic aneurysm (AAA) ([place]) 06/12/2020   Anemia 09/13/2019   Goiter 09/13/2019   Osteopenia 09/13/2019    ONSET DATE: 08/28/21  REFERRING DIAG: I63.81 (ICD-10-CM) - 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 ([place])  THERAPY DIAG:  Difficulty in walking, not elsewhere classified  Muscle weakness (generalized)  Abnormality of gait and mobility  Other lack of coordination  Other abnormalities of gait and mobility  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)  Rationale for Evaluation and Treatment Rehabilitation  SUBJECTIVE:                                                                                                                                                                                               SUBJECTIVE STATEMENT:  Pt notes no new complaints upon arrival to the clinic.  Pt notes that HEP is going well and she has been doing them every day.    PERTINENT HISTORY:  Per Brief HPI from d/c 8/14: Susan Farrell is a 76 y.o. right-handed female with history of discoid lupus, hypertension, hyperlipidemia, hypothyroidism, abdominal aortic aneurysm with repair tobacco use.  Per chart review lives with spouse independent prior to admission. Presented to [facility] 08/28/2021 with acute onset  of left-sided weakness. 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  Underlying age-related cerebral atrophy with mild chronic small vessel changes.  CT angiogram head and neck showed internal carotid and vertebral arteries to be patent.  No intracranial large vessel occlusion or stenosis.   She did sustain a fall 08/30/2021 when attempting to get out of bed without assistance and no injury sustained.  Therapy evaluations completed due to patient decreased functional mobility was admitted for a comprehensive rehab program.  PAIN:  Are you having pain? No  PRECAUTIONS: Fall  WEIGHT BEARING RESTRICTIONS No  FALLS: Has patient fallen in last 6 months? Yes. Number of falls 1, slipped on towel in hospital and ended up in floor.   LIVING ENVIRONMENT: Lives with: lives with their family and lives with their partner Lives in: House/apartment Stairs: Yes: External: 1 steps; none Has following equipment at home: Gilford Rile - 2 wheeled, Environmental consultant - 4 wheeled, bed side commode, and only uses bed side commode at night   PLOF: Independent, Independent with basic ADLs, Independent with household mobility without device, Independent with community mobility without device, Independent with gait, and Leisure: going to church, going to grocery and department stores.   PATIENT GOALS  Get back to normal walking and balance without any support.   OBJECTIVE:   FUNCTIONAL TESTs:  5 times sit to stand: 18.02 sec 22 6 minute walk test: 234 feet and requires seated rest break due to lower extremity fatigue on the left lower extremity  Berg Balance Scale: 36  PATIENT SURVEYS:  FOTO 65  TODAY'S TREATMENT:    TherEx:   Nustep at conclusion for cool-down and to improve cardiovascular function 3.5 min level 2  - seat 9 and with UE utilization throughout  Seated without AFO Dynadisc: -df/pf 10x -inversion/eversion 10x -clockwise circle 10x, counterclockwise 10x -towel scrunches, 2x10 -ankle inversion/eversion towel slides, 2x10 each direction -arch lifting, 2x10 -toe spreading, 2x10  Standing on 6' step, performing hip hikes for increased glute activation and prevent trendelenburg when ambulating, 2x10 each Le Eccentric lateral lowering from 6' step for increased hamstring activation, 2x10 each LE    Gait Training  Ambulation in hallway with no AD, AFO donned, CGA provided, length of hallway x2 Backwards ambulation in hallway, length of hallway x2 Sidesteps/crabwalk length of hallway x1 each direction with focus on lifting hips for foot clearance        PATIENT EDUCATION: Education details: POC Person educated: Patient Education method: Explanation Education comprehension: verbalized understanding   HOME EXERCISE PROGRAM:  Access Code: [email or web address] URL: [email or web address] Date: 10/23/2021 Prepared by: [place]  - 1 x daily - 7 x weekly - 3 sets - 10 reps - Ankle Inversion Eversion Towel Slide  - 1 x daily - 7 x weekly - 3 sets - 10 reps - Arch Lifting  - 1 x daily - 7 x weekly - 3 sets - 10 reps - Toe Spreading  - 1 x daily - 7 x weekly - 3 sets - 10 reps   Access Code: [email or web address] URL: [email or web address] Date: 10/01/2021 Prepared by: Ward Nation  Exercises - Supine Active  Straight Leg Raise  - 1 x daily - 7 x weekly - 3 sets - 10 reps - Sidelying Hip Abduction  - 1 x daily - 7 x weekly - 3 sets - 10 reps - Supine Bridge  - 1 x daily - 7 x weekly - 3 sets - 10 reps -  Clamshell  - 1 x daily - 7 x weekly - 3 sets - 10 reps - Standing hip extension  - 1 x daily - 7 x weekly - 3 sets - 10 reps    GOALS:  Goals reviewed with patient? Yes  SHORT TERM GOALS: Target date: 10/16/2021  Patient will be independent in home exercise program to improve strength/mobility for better functional independence with ADLs. Baseline: No HEP currently  Goal status: INITIAL   LONG TERM GOALS: Target date: 12/11/2021  1.  Patient (> 54 years old) will complete five times sit to stand test in < 15 seconds indicating an increased LE strength and improved balance. Baseline: 18 sec  Goal status: INITIAL   2.  Patient will increase FOTO score to equal to or greater than  77   to demonstrate statistically significant improvement in mobility and quality of life.  Baseline: 65 Goal status: INITIAL    3.  Patient will increase Berg Balance score by > 6 points to demonstrate decreased fall risk during functional activities. Baseline: 36 Goal status: INITIAL    4.  Patient will increase 10 meter walk test to >1.46m/s as to improve gait speed for better community ambulation and to reduce fall risk. Baseline: .43m/s Goal status: INITIAL   5.  Patient will increase six minute walk test distance to >1000 for progression to community ambulator and improve gait ability Baseline: 234 ft Goal status: INITIAL    ASSESSMENT: Pt given new exercises today and was able to tolerate treatment well with improved motor function, specifically in the ankle musculature.  Pt noted to have significant weakness in the glutes of the L side, so exercises introduced to target problematic area.  Pt responded well to those exercises, noting the hip to be fatigued after performance of the exercises.  Pt  encouraged to continue with HEP and was given updated copy of ankle musculature exercises in order to improve motor function of the ankle.   Pt will continue to benefit from skilled therapy to address remaining deficits in order to improve overall QoL and return to PLOF.           OBJECTIVE IMPAIRMENTS Abnormal gait, decreased activity tolerance, decreased balance, decreased coordination, decreased endurance, decreased mobility, difficulty walking, and decreased strength.   ACTIVITY LIMITATIONS lifting, bending, standing, squatting, stairs, dressing, and caring for others  PARTICIPATION LIMITATIONS: meal prep, cleaning, driving, shopping, yard work, and church  PERSONAL FACTORS Age are also affecting patient's functional outcome.   REHAB POTENTIAL: Good  CLINICAL DECISION MAKING: Evolving/moderate complexity  EVALUATION COMPLEXITY: Low  PLAN: PT FREQUENCY: 2x/week  PT DURATION: 12 weeks  PLANNED INTERVENTIONS: Therapeutic exercises, Therapeutic activity, Neuromuscular re-education, Balance training, Gait training, Patient/Family education, Self Care, Joint mobilization, Dry Needling, and Manual therapy  PLAN FOR NEXT SESSION: begin POC, HEP    Gwenlyn Saran, PT, DPT 10/23/21, 2:49 PM

## 2021-10-28 ENCOUNTER — Encounter: Payer: Medicare HMO | Admitting: Occupational Therapy

## 2021-10-30 ENCOUNTER — Encounter: Payer: Medicare HMO | Admitting: Occupational Therapy

## 2021-10-30 ENCOUNTER — Encounter: Payer: Self-pay | Admitting: Physical Therapy

## 2021-10-30 ENCOUNTER — Ambulatory Visit: Payer: Medicare HMO

## 2021-10-30 DIAGNOSIS — R262 Difficulty in walking, not elsewhere classified: Secondary | ICD-10-CM | POA: Diagnosis not present

## 2021-10-30 DIAGNOSIS — I6381 Other cerebral infarction due to occlusion or stenosis of small artery: Secondary | ICD-10-CM

## 2021-10-30 DIAGNOSIS — R2689 Other abnormalities of gait and mobility: Secondary | ICD-10-CM

## 2021-10-30 DIAGNOSIS — M6281 Muscle weakness (generalized): Secondary | ICD-10-CM

## 2021-10-30 DIAGNOSIS — R269 Unspecified abnormalities of gait and mobility: Secondary | ICD-10-CM | POA: Diagnosis not present

## 2021-10-30 DIAGNOSIS — R278 Other lack of coordination: Secondary | ICD-10-CM | POA: Diagnosis not present

## 2021-10-30 NOTE — Therapy (Signed)
OUTPATIENT PHYSICAL THERAPY NEURO TREATMENT   Patient Name: Susan Farrell MRN: [id number] DOB:1945-12-09, 76 y.o., female Today's Date: 10/30/2021   PCP: Barbette Reichmann, MD REFERRING PROVIDER: Charlton Amor, PA-C   PT End of Session - 10/30/21 1543     Visit Number 7    Number of Visits 24    Date for PT Re-Evaluation 12/11/21    Progress Note Due on Visit 10    PT Start Time 1348    PT Stop Time 1431    PT Time Calculation (min) 43 min    Equipment Utilized During Treatment Gait belt    Activity Tolerance Patient tolerated treatment well    Behavior During Therapy [facility] for tasks assessed/performed               History reviewed. No pertinent past medical history. Past Surgical History:  Procedure Laterality Date   ABDOMINAL HYSTERECTOMY     CHOLECYSTECTOMY     ENDOVASCULAR REPAIR/STENT GRAFT N/A 06/12/2020   Procedure: ENDOVASCULAR REPAIR/STENT GRAFT;  Surgeon: Renford Dills, MD;  Location: [facility] INVASIVE CV LAB;  Service: Cardiovascular;  Laterality: N/A;   Patient Active Problem List   Diagnosis Date Noted   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) 09/02/2021   Hypertensive urgency 08/29/2021   Dyslipidemia 08/29/2021   Hypothyroidism 08/29/2021   CVA (cerebral vascular accident) (HCC) 08/28/2021   Ruptured abdominal aortic aneurysm (AAA) (HCC) 06/12/2020   Anemia 09/13/2019   Goiter 09/13/2019   Osteopenia 09/13/2019    ONSET DATE: 08/28/21  REFERRING DIAG: I63.81 (ICD-10-CM) - 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)  THERAPY DIAG:  Difficulty in walking, not elsewhere classified  Abnormality of gait and mobility  Muscle weakness (generalized)  Other lack of coordination  Other abnormalities of gait and mobility  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)  Rationale for Evaluation and Treatment Rehabilitation  SUBJECTIVE:                                                                                                                                                                                               SUBJECTIVE STATEMENT:  Pt denies complaints, pain or falls this date. Curious on continued exercise and gait without AFO to improve mobility and strength.    PERTINENT HISTORY:  Per Brief HPI from d/c 8/14: Susan Farrell is a 76 y.o. right-handed female with history of discoid lupus, hypertension, hyperlipidemia, hypothyroidism, abdominal aortic aneurysm with repair tobacco use.  Per chart review lives with spouse independent prior to admission. Presented to [facility] 08/28/2021 with acute onset of left-sided weakness. CT/MRI  showed a 1 cm acute ischemic nonhemorrhagic right basal ganglia infarction.  Underlying age-related cerebral atrophy with mild chronic small vessel changes.  CT angiogram head and neck showed internal carotid and vertebral arteries to be patent.  No intracranial large vessel occlusion or stenosis.   She did sustain a fall 08/30/2021 when attempting to get out of bed without assistance and no injury sustained.  Therapy evaluations completed due to patient decreased functional mobility was admitted for a comprehensive rehab program.  PAIN:  Are you having pain? No  PRECAUTIONS: Fall  WEIGHT BEARING RESTRICTIONS No  FALLS: Has patient fallen in last 6 months? Yes. Number of falls 1, slipped on towel in hospital and ended up in floor.   LIVING ENVIRONMENT: Lives with: lives with their family and lives with their partner Lives in: House/apartment Stairs: Yes: External: 1 steps; none Has following equipment at home: Gilford Rile - 2 wheeled, Environmental consultant - 4 wheeled, bed side commode, and only uses bed side commode at night   PLOF: Independent, Independent with basic ADLs, Independent with household mobility without device, Independent with community mobility without device, Independent with gait, and Leisure: going to church, going to grocery and department  stores.   PATIENT GOALS Get back to normal walking and balance without any support.   OBJECTIVE:   FUNCTIONAL TESTs:  5 times sit to stand: 18.02 sec 22 6 minute walk test: 234 feet and requires seated rest break due to lower extremity fatigue on the left lower extremity  Berg Balance Scale: 36  PATIENT SURVEYS:  FOTO 65  TODAY'S TREATMENT:   There.Ex:    Nustep L2 for 5 min UE/LE use for warm up    Seated without AFO  Dynadisc:   df/pf 2x10   inversion/eversion 2x10   2x10 towel crunches:   Education on STS exercise for improve CKC quad and glut strength to assist in ankle stability with gait tasks.   Neuro Re-Ed: Airex pad exercises: CGA   Narrow BOS: x1 min, min ankle strategy   Narrow BOS, eyes closed: 3x30 sec, ankle/hip strategy, intermittent need for UE support.   Lateral side step ups: x10/LE   Alternating cone taps: SUE support intermittently without AFO. CGA to minA primarily standing on LLE.   Gait without AFO with VC's for consistent LLE heel strike for foot clearance and quad engagement. 165' CGA. Minimal push off noted.     Education on safe ways to perform standing cone taps at counter at home with husband and chair for safety.     PATIENT EDUCATION: Education details: POC Person educated: Patient Education method: Explanation Education comprehension: verbalized understanding   HOME EXERCISE PROGRAM:  Access Code: [email or web address] URL: [email or web address] Date: 10/23/2021 Prepared by: [place]  - 1 x daily - 7 x weekly - 3 sets - 10 reps - Ankle Inversion Eversion Towel Slide  - 1 x daily - 7 x weekly - 3 sets - 10 reps - Arch Lifting  - 1 x daily - 7 x weekly - 3 sets - 10 reps - Toe Spreading  - 1 x daily - 7 x weekly - 3 sets - 10 reps   Access Code: [email or web address] URL: [email or web address] Date: 10/01/2021 Prepared by: [place] [employer]  Exercises - Supine Active Straight Leg Raise   - 1 x daily - 7 x weekly - 3 sets - 10 reps - Sidelying Hip Abduction  - 1 x daily - 7 x weekly - 3 sets - 10  reps - Supine Bridge  - 1 x daily - 7 x weekly - 3 sets - 10 reps - Clamshell  - 1 x daily - 7 x weekly - 3 sets - 10 reps - Standing hip extension  - 1 x daily - 7 x weekly - 3 sets - 10 reps    GOALS:  Goals reviewed with patient? Yes  SHORT TERM GOALS: Target date: 10/16/2021  Patient will be independent in home exercise program to improve strength/mobility for better functional independence with ADLs. Baseline: No HEP currently  Goal status: INITIAL   LONG TERM GOALS: Target date: 12/11/2021  1.  Patient (> 24 years old) will complete five times sit to stand test in < 15 seconds indicating an increased LE strength and improved balance. Baseline: 18 sec  Goal status: INITIAL   2.  Patient will increase FOTO score to equal to or greater than  77   to demonstrate statistically significant improvement in mobility and quality of life.  Baseline: 65 Goal status: INITIAL    3.  Patient will increase Berg Balance score by > 6 points to demonstrate decreased fall risk during functional activities. Baseline: 36 Goal status: INITIAL    4.  Patient will increase 10 meter walk test to >1.81m/s as to improve gait speed for better community ambulation and to reduce fall risk. Baseline: .39m/s Goal status: INITIAL   5.  Patient will increase six minute walk test distance to >1000 for progression to community ambulator and improve gait ability Baseline: 234 ft Goal status: INITIAL    ASSESSMENT: Continuing PT POC with ankle stability and gait. Pt tolerating gait without AD with adequate foot clearance but LLE fatigues with increased distance. Educated on safe ways to perform balance and strength exercises.  Pt will continue to benefit from skilled therapy to address remaining deficits in order to improve overall QoL and return to PLOF.      OBJECTIVE IMPAIRMENTS  Abnormal gait, decreased activity tolerance, decreased balance, decreased coordination, decreased endurance, decreased mobility, difficulty walking, and decreased strength.   ACTIVITY LIMITATIONS lifting, bending, standing, squatting, stairs, dressing, and caring for others  PARTICIPATION LIMITATIONS: meal prep, cleaning, driving, shopping, yard work, and church  PERSONAL FACTORS Age are also affecting patient's functional outcome.   REHAB POTENTIAL: Good  CLINICAL DECISION MAKING: Evolving/moderate complexity  EVALUATION COMPLEXITY: Low  PLAN: PT FREQUENCY: 2x/week  PT DURATION: 12 weeks  PLANNED INTERVENTIONS: Therapeutic exercises, Therapeutic activity, Neuromuscular re-education, Balance training, Gait training, Patient/Family education, Self Care, Joint mobilization, Dry Needling, and Manual therapy  PLAN FOR NEXT SESSION: begin POC, HEP   Collyn Ribas M. Fairly IV, PT, DPT Physical Therapist- [facility]  10/30/21, 4:02 PM

## 2021-11-06 ENCOUNTER — Ambulatory Visit: Payer: Medicare HMO

## 2021-11-06 DIAGNOSIS — R269 Unspecified abnormalities of gait and mobility: Secondary | ICD-10-CM

## 2021-11-06 DIAGNOSIS — M6281 Muscle weakness (generalized): Secondary | ICD-10-CM

## 2021-11-06 DIAGNOSIS — R2689 Other abnormalities of gait and mobility: Secondary | ICD-10-CM

## 2021-11-06 DIAGNOSIS — I6381 Other cerebral infarction due to occlusion or stenosis of small artery: Secondary | ICD-10-CM | POA: Diagnosis not present

## 2021-11-06 DIAGNOSIS — R278 Other lack of coordination: Secondary | ICD-10-CM | POA: Diagnosis not present

## 2021-11-06 DIAGNOSIS — R262 Difficulty in walking, not elsewhere classified: Secondary | ICD-10-CM

## 2021-11-06 NOTE — Therapy (Signed)
OUTPATIENT PHYSICAL THERAPY NEURO TREATMENT   Patient Name: Susan Farrell MRN: [id number] DOB:05-25-1945, 76 y.o., female Today's Date: 11/06/2021   PCP: Tracie Harrier, MD REFERRING PROVIDER: Cathlyn Parsons, PA-C   PT End of Session - 11/06/21 [phone]     Visit Number 8    Number of Visits 24    Date for PT Re-Evaluation 12/11/21    Progress Note Due on Visit 10    PT Start Time 0849    PT Stop Time 0930    PT Time Calculation (min) 41 min    Equipment Utilized During Treatment Gait belt    Activity Tolerance Patient tolerated treatment well    Behavior During Therapy [facility] for tasks assessed/performed                No past medical history on file. Past Surgical History:  Procedure Laterality Date   ABDOMINAL HYSTERECTOMY     CHOLECYSTECTOMY     ENDOVASCULAR REPAIR/STENT GRAFT N/A 06/12/2020   Procedure: ENDOVASCULAR REPAIR/STENT GRAFT;  Surgeon: Katha Cabal, MD;  Location: [place] CV [facility];  Service: Cardiovascular;  Laterality: N/A;   Patient Active Problem List   Diagnosis Date Noted   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 ([place]) 09/02/2021   Hypertensive urgency 08/29/2021   Dyslipidemia 08/29/2021   Hypothyroidism 08/29/2021   CVA (cerebral vascular accident) ([place]) 08/28/2021   Ruptured abdominal aortic aneurysm (AAA) ([place]) 06/12/2020   Anemia 09/13/2019   Goiter 09/13/2019   Osteopenia 09/13/2019    ONSET DATE: 08/28/21  REFERRING DIAG: I63.81 (ICD-10-CM) - 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 ([place])  THERAPY DIAG:  Difficulty in walking, not elsewhere classified  Muscle weakness (generalized)  Abnormality of gait and mobility  Other lack of coordination  Other abnormalities of gait and mobility  Rationale for Evaluation and Treatment Rehabilitation  SUBJECTIVE:                                                                                                                                                                                               SUBJECTIVE STATEMENT:  Pt notes no new complaints upon arrival to the clinic today.  Pt states she has been performing ambulation more at home without the AFO.  She reports that it is going well and has been able to improve her dorsiflexion AROM.   PERTINENT HISTORY:  Per Brief HPI from d/c 8/14: Susan Farrell is a 76 y.o. right-handed female with history of discoid lupus, hypertension, hyperlipidemia, hypothyroidism, abdominal aortic aneurysm with repair tobacco use.  Per chart review lives with spouse independent prior to admission.  Presented to [facility] 08/28/2021 with acute onset of left-sided weakness. 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  Underlying age-related cerebral atrophy with mild chronic small vessel changes.  CT angiogram head and neck showed internal carotid and vertebral arteries to be patent.  No intracranial large vessel occlusion or stenosis.   She did sustain a fall 08/30/2021 when attempting to get out of bed without assistance and no injury sustained.  Therapy evaluations completed due to patient decreased functional mobility was admitted for a comprehensive rehab program.  PAIN:  Are you having pain? No  PRECAUTIONS: Fall  WEIGHT BEARING RESTRICTIONS No  FALLS: Has patient fallen in last 6 months? Yes. Number of falls 1, slipped on towel in hospital and ended up in floor.   LIVING ENVIRONMENT: Lives with: lives with their family and lives with their partner Lives in: House/apartment Stairs: Yes: External: 1 steps; none Has following equipment at home: Gilford Rile - 2 wheeled, Environmental consultant - 4 wheeled, bed side commode, and only uses bed side commode at night   PLOF: Independent, Independent with basic ADLs, Independent with household mobility without device, Independent with community mobility without device, Independent with gait, and Leisure: going to church, going to grocery and  department stores.   PATIENT GOALS Get back to normal walking and balance without any support.   OBJECTIVE:   FUNCTIONAL TESTs:  5 times sit to stand: 18.02 sec 22 6 minute walk test: 234 feet and requires seated rest break due to lower extremity fatigue on the left lower extremity  Berg Balance Scale: 36  PATIENT SURVEYS:  FOTO 65  TODAY'S TREATMENT:   There.Ex:    Seated without AFO:  Heel raises, 3# AW donned bilaterally, 2x15 LAQ, 3# AW donned bilaterally, 2x10 Marches,  3# AW donned bilaterally, 2x10 Lateral step-over hedgehogs,  3# AW donned bilaterally, 2x10  Education on STS exercise for improve CKC quad and glute strength to assist in ankle stability with gait tasks.    Neuro Re-Ed:  Airex pad exercises: CGA  Narrow BOS: x1 min, min ankle strategy  Narrow BOS, eyes closed: 3x30 sec, ankle/hip strategy, intermittent need for UE support.  Lateral side step ups: x10/LE  SLS 30 sec bouts each LE, L LE requiring minimal UE assist Calf raises for increased proprioception with mobility of the ankle Static standing on incline/decline for improve proprioception necessary for navigation of different terrains  Gait without AFO with verbal cues for consistent LLE heel strike for foot clearance and quad engagement. 300' CGA. Minimal push off noted.      PATIENT EDUCATION: Education details: POC Person educated: Patient Education method: Explanation Education comprehension: verbalized understanding   HOME EXERCISE PROGRAM:  Access Code: [email or web address] URL: [email or web address] Date: 10/23/2021 Prepared by: [place]  - 1 x daily - 7 x weekly - 3 sets - 10 reps - Ankle Inversion Eversion Towel Slide  - 1 x daily - 7 x weekly - 3 sets - 10 reps - Arch Lifting  - 1 x daily - 7 x weekly - 3 sets - 10 reps - Toe Spreading  - 1 x daily - 7 x weekly - 3 sets - 10 reps   Access Code: [email or web address] URL:  [email or web address] Date: 10/01/2021 Prepared by: [place]  Exercises - Supine Active Straight Leg Raise  - 1 x daily - 7 x weekly - 3 sets - 10 reps - Sidelying Hip Abduction  - 1 x daily -  7 x weekly - 3 sets - 10 reps - Supine Bridge  - 1 x daily - 7 x weekly - 3 sets - 10 reps - Clamshell  - 1 x daily - 7 x weekly - 3 sets - 10 reps - Standing hip extension  - 1 x daily - 7 x weekly - 3 sets - 10 reps    GOALS:  Goals reviewed with patient? Yes  SHORT TERM GOALS: Target date: 10/16/2021  Patient will be independent in home exercise program to improve strength/mobility for better functional independence with ADLs. Baseline: No HEP currently  Goal status: INITIAL   LONG TERM GOALS: Target date: 12/11/2021  1.  Patient (> 55 years old) will complete five times sit to stand test in < 15 seconds indicating an increased LE strength and improved balance. Baseline: 18 sec  Goal status: INITIAL   2.  Patient will increase FOTO score to equal to or greater than  77   to demonstrate statistically significant improvement in mobility and quality of life.  Baseline: 65 Goal status: INITIAL    3.  Patient will increase Berg Balance score by > 6 points to demonstrate decreased fall risk during functional activities. Baseline: 36 Goal status: INITIAL    4.  Patient will increase 10 meter walk test to >1.34m/s as to improve gait speed for better community ambulation and to reduce fall risk. Baseline: .61m/s Goal status: INITIAL   5.  Patient will increase six minute walk test distance to >1000 for progression to community ambulator and improve gait ability Baseline: 234 ft Goal status: INITIAL    ASSESSMENT:  Pt performed well with all exercises and noted to have improved dorsiflexion with gait and tasks.  Pt still having some foot clearance difficulty with gait, but is much improved and with focus placed on exercises to challenge balance and strengthening of  the dorsiflexion will assist in improving her gait.  Pt encouraged to continue performing tasks without AFO in order to improve proprioception of the ankle and improved strength of the musculature of the ankle.   Pt will continue to benefit from skilled therapy to address remaining deficits in order to improve overall QoL and return to PLOF.       OBJECTIVE IMPAIRMENTS Abnormal gait, decreased activity tolerance, decreased balance, decreased coordination, decreased endurance, decreased mobility, difficulty walking, and decreased strength.   ACTIVITY LIMITATIONS lifting, bending, standing, squatting, stairs, dressing, and caring for others  PARTICIPATION LIMITATIONS: meal prep, cleaning, driving, shopping, yard work, and church  PERSONAL FACTORS Age are also affecting patient's functional outcome.   REHAB POTENTIAL: Good  CLINICAL DECISION MAKING: Evolving/moderate complexity  EVALUATION COMPLEXITY: Low  PLAN: PT FREQUENCY: 2x/week  PT DURATION: 12 weeks  PLANNED INTERVENTIONS: Therapeutic exercises, Therapeutic activity, Neuromuscular re-education, Balance training, Gait training, Patient/Family education, Self Care, Joint mobilization, Dry Needling, and Manual therapy  PLAN FOR NEXT SESSION: progress balance exercises and continue working on ambulation and exercises without AFO.  Gwenlyn Saran, PT, DPT Physical Therapist- [facility]  11/06/21, 12:24 PM

## 2021-11-08 ENCOUNTER — Ambulatory Visit: Payer: Medicare HMO

## 2021-11-08 ENCOUNTER — Encounter: Payer: Medicare HMO | Admitting: Occupational Therapy

## 2021-11-08 DIAGNOSIS — M6281 Muscle weakness (generalized): Secondary | ICD-10-CM | POA: Diagnosis not present

## 2021-11-08 DIAGNOSIS — R262 Difficulty in walking, not elsewhere classified: Secondary | ICD-10-CM

## 2021-11-08 DIAGNOSIS — R269 Unspecified abnormalities of gait and mobility: Secondary | ICD-10-CM | POA: Diagnosis not present

## 2021-11-08 DIAGNOSIS — R278 Other lack of coordination: Secondary | ICD-10-CM

## 2021-11-08 DIAGNOSIS — R2689 Other abnormalities of gait and mobility: Secondary | ICD-10-CM | POA: Diagnosis not present

## 2021-11-08 DIAGNOSIS — I6381 Other cerebral infarction due to occlusion or stenosis of small artery: Secondary | ICD-10-CM | POA: Diagnosis not present

## 2021-11-08 NOTE — Therapy (Signed)
OUTPATIENT PHYSICAL THERAPY NEURO TREATMENT & reassessment   Patient Name: Susan Farrell MRN: [id number] DOB:Jul 04, 1945, 76 y.o., female Today's Date: 11/08/2021   PCP: Tracie Harrier, MD REFERRING PROVIDER: Cathlyn Parsons, PA-C   PT End of Session - 11/08/21 0806     Visit Number 9    Number of Visits 24    Date for PT Re-Evaluation 12/11/21    Authorization Type [employer]    Authorization Time Period 09/18/21-12/11/21    Progress Note Due on Visit 10    PT Start Time 0802    PT Stop Time 0842    PT Time Calculation (min) 40 min    Equipment Utilized During Treatment Gait belt    Activity Tolerance Patient tolerated treatment well    Behavior During Therapy [facility] for tasks assessed/performed                No past medical history on file. Past Surgical History:  Procedure Laterality Date   ABDOMINAL HYSTERECTOMY     CHOLECYSTECTOMY     ENDOVASCULAR REPAIR/STENT GRAFT N/A 06/12/2020   Procedure: ENDOVASCULAR REPAIR/STENT GRAFT;  Surgeon: Katha Cabal, MD;  Location: [facility];  Service: Cardiovascular;  Laterality: N/A;   Patient Active Problem List   Diagnosis Date Noted   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 ([place]) 09/02/2021   Hypertensive urgency 08/29/2021   Dyslipidemia 08/29/2021   Hypothyroidism 08/29/2021   CVA (cerebral vascular accident) ([place]) 08/28/2021   Ruptured abdominal aortic aneurysm (AAA) ([place]) 06/12/2020   Anemia 09/13/2019   Goiter 09/13/2019   Osteopenia 09/13/2019    ONSET DATE: 08/28/21  REFERRING DIAG: I63.81 (ICD-10-CM) - 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 ([place])  THERAPY DIAG:  Difficulty in walking, not elsewhere classified  Muscle weakness (generalized)  Abnormality of gait and mobility  Other lack of coordination  Rationale for Evaluation and Treatment Rehabilitation  SUBJECTIVE:                                                                                                                                                                                               SUBJECTIVE STATEMENT: Pt doing well. Has returned to community dwelling ad lib. Still practicing AMB without AFO in home and with AFO when out. Pt only using SBQC for mobility at this point, no longer using walker. No falls since last session.    PERTINENT HISTORY:  Susan Farrell is a 51yoF PMH: DLE, HTN, HLD, hypoTSH, AAA s/p repair, tobacco use.  Presented to [facility]/28/2021 with acute onset of left-sided weakness. CT/MRI showed a 1 cm acute ischemic  non-hemorrhagic right basal ganglia infarction.  She did sustain a fall 08/30/2021 when attempting to get out of bed without assistance and no injury sustained. Pt reports chief complaint is LLE weakness. Pt reports one slip on a towel in the hospital where she ended up in the floor but personally would not consider it to be a fall. Pt reports no other falls prior to this or since. Pt reports no significant Upper extremity deficits or memory or language deficits at this time and OT in hospital seemed to work more on LE deficits, again per her report. Pt reports at time of evaluation she is using a walker for all household mobility.  PAIN:  Are you having pain? No  PRECAUTIONS: Fall  WEIGHT BEARING RESTRICTIONS No  PATIENT GOALS Get back to normal walking and balance without any support.   OBJECTIVE:   Functional Outcome measures  Initial 11/08/21    FOTO 65 76    5xSTS 18sec 14.09sec    6MWT  260ft 711ft (AFO, SBQC)    10MWT  NT    Berg Balance Test  36 NT     MMT:  BLE Strength Assessment  11/08/21 09/18/21 09/18/21 11/08/21 11/08/21   Right  Left Right  Left  Hip Flexion 5/5 3+/5 5/5 4/5  Hip Horizontal ABDCT  NT NT 5/5 4/5  Hip ABDCT 5/5 4/5 NT NT  Hip Horizontal ADD NT NT 5/5 4+/5  Hip ADD  5/5 4/5 NT NT  Hip IR (seated)  NT NT 5/5 4/5  Hip ER (seated) NT NT 5/5 4+/5  Knee Extension 5/5 3+/5 5/5 5/5  Knee Flexion  (seated) 5/5 3+/5 5/5 4-/5  Ankle Dorsiflexion NT NT NT NT     TODAY'S TREATMENT:   6 minute walk test  5xSTS FOTO survey  There.Ex:   -32ft AMB c 5lb AW LLE c SBQC RUE:   2:34, 113/20 on Borg RPE  -seated recovery/FOTO -394ft AMB c 7.5# AW LLE c SBQC RUE   2:23, 11/20 on Borg RPE (yes 11)        PATIENT EDUCATION: Education details: May need to trial short distance running here at some point  Person educated: Patient Education method: Explanation Education comprehension: verbalized understanding   HOME EXERCISE PROGRAM: Access Code: [email or web address] URL: [email or web address] Date: 10/23/2021 Prepared by: [place]  - 1 x daily - 7 x weekly - 3 sets - 10 reps - Ankle Inversion Eversion Towel Slide  - 1 x daily - 7 x weekly - 3 sets - 10 reps - Arch Lifting  - 1 x daily - 7 x weekly - 3 sets - 10 reps - Toe Spreading  - 1 x daily - 7 x weekly - 3 sets - 10 reps   Access Code: [email or web address] URL: [email or web address] Date: 10/01/2021 Prepared by: [place]  Exercises - Supine Active Straight Leg Raise  - 1 x daily - 7 x weekly - 3 sets - 10 reps - Sidelying Hip Abduction  - 1 x daily - 7 x weekly - 3 sets - 10 reps - Supine Bridge  - 1 x daily - 7 x weekly - 3 sets - 10 reps - Clamshell  - 1 x daily - 7 x weekly - 3 sets - 10 reps - Standing hip extension  - 1 x daily - 7 x weekly - 3 sets - 10 reps    GOALS:  Goals reviewed with patient? Yes  SHORT TERM GOALS: Target date:  10/16/2021  Patient will be independent in home exercise program to improve strength/mobility for better functional independence with ADLs. Baseline: No HEP currently  Goal status: INITIAL   LONG TERM GOALS: Target date: 12/11/2021  1.  Patient (> 23 years old) will complete five times sit to stand test in < 15 seconds indicating an increased LE strength and improved balance. Baseline: 18 sec Goal status: Achieved    2.   Patient will increase FOTO score to equal to or greater than  77   to demonstrate statistically significant improvement in mobility and quality of life.  Baseline: 65 Goal status: Progressing     3.  Patient will increase Berg Balance score by > 6 points to demonstrate decreased fall risk during functional activities. Baseline: 36 Goal status: Pended reassessment     4.  Patient will increase 10 meter walk test to >1.36m/s as to improve gait speed for better community ambulation and to reduce fall risk. Baseline: .43m/s Goal status: Pended reassessment    5.  Patient will increase six minute walk test distance to >1000 for progression to community ambulator and improve gait ability Baseline: 234 ft Goal status: Progressing     ASSESSMENT: Pt now over 30 days into cert here and coming up on 10th visit, we began reassessment of some outcome measures, pt showing dramatic improvement in 6MWT, 5xSTS, and FOTO scores, and making excellent progress toward many long term goals of therapy ahead of projection. I have defered remaining testing to next session. Pt is pleased with her progress. Commenced moderate intensity gait taining intervals this date with loading of the hemiplegic LLE. Generally good tolerance overall. Pt communicates well her exertion and limitations in the context of safety awareness, pauses to rest as needed. Noted safe/fluent use of SBQC, no LOB in session althouhg minGuard assistance is provided with safety belt. Pt will continue to benefit from skilled therapy to address remaining deficits in order to improve overall QoL and return to PLOF.       OBJECTIVE IMPAIRMENTS Abnormal gait, decreased activity tolerance, decreased balance, decreased coordination, decreased endurance, decreased mobility, difficulty walking, and decreased strength.   ACTIVITY LIMITATIONS lifting, bending, standing, squatting, stairs, dressing, and caring for others  PARTICIPATION LIMITATIONS: meal  prep, cleaning, driving, shopping, yard work, and church  PERSONAL FACTORS Age are also affecting patient's functional outcome.   REHAB POTENTIAL: Good  CLINICAL DECISION MAKING: Evolving/moderate complexity  EVALUATION COMPLEXITY: Low  PLAN: PT FREQUENCY: 2x/week  PT DURATION: 12 weeks  PLANNED INTERVENTIONS: Therapeutic exercises, Therapeutic activity, Neuromuscular re-education, Balance training, Gait training, Patient/Family education, Self Care, Joint mobilization, Dry Needling, and Manual therapy  PLAN FOR NEXT SESSION: progress balance exercises and continue working on ambulation and exercises without AFO.  9:09 AM, 11/08/21 Etta Grandchild, PT, DPT Physical Therapist - [facility]  [phone] [facility])

## 2021-11-09 ENCOUNTER — Emergency Department: Payer: Medicare HMO

## 2021-11-09 ENCOUNTER — Encounter: Payer: Self-pay | Admitting: Emergency Medicine

## 2021-11-09 ENCOUNTER — Other Ambulatory Visit: Payer: Self-pay

## 2021-11-09 ENCOUNTER — Emergency Department
Admission: EM | Admit: 2021-11-09 | Discharge: 2021-11-09 | Disposition: A | Discharge status: Home / Self Care | Payer: Medicare HMO | Attending: Emergency Medicine | Admitting: Emergency Medicine

## 2021-11-09 DIAGNOSIS — H81399 Other peripheral vertigo, unspecified ear: Secondary | ICD-10-CM | POA: Diagnosis not present

## 2021-11-09 DIAGNOSIS — I6381 Other cerebral infarction due to occlusion or stenosis of small artery: Secondary | ICD-10-CM | POA: Diagnosis not present

## 2021-11-09 DIAGNOSIS — E039 Hypothyroidism, unspecified: Secondary | ICD-10-CM | POA: Diagnosis not present

## 2021-11-09 DIAGNOSIS — R110 Nausea: Secondary | ICD-10-CM | POA: Diagnosis not present

## 2021-11-09 DIAGNOSIS — I10 Essential (primary) hypertension: Secondary | ICD-10-CM | POA: Insufficient documentation

## 2021-11-09 DIAGNOSIS — R42 Dizziness and giddiness: Secondary | ICD-10-CM | POA: Diagnosis not present

## 2021-11-09 HISTORY — DX: Cerebral infarction, unspecified: I63.9

## 2021-11-09 HISTORY — DX: Essential (primary) hypertension: I10

## 2021-11-09 LAB — BASIC METABOLIC PANEL
Anion gap: 7 (ref 5–15)
BUN: 11 mg/dL (ref 8–23)
CO2: 24 mmol/L (ref 22–32)
Calcium: 9 mg/dL (ref 8.9–10.3)
Chloride: 111 mmol/L (ref 98–111)
Creatinine, Ser: 0.72 mg/dL (ref 0.44–1.00)
GFR, Estimated: >60 mL/min (ref >60)
Glucose, Bld: 119 mg/dL — ABNORMAL HIGH (ref 70–99)
Potassium: 3.1 mmol/L — ABNORMAL LOW (ref 3.5–5.1)
Sodium: 142 mmol/L (ref 135–145)

## 2021-11-09 LAB — TROPONIN I (HIGH SENSITIVITY): Troponin I (High Sensitivity): 6 ng/L (ref <18)

## 2021-11-09 LAB — CBC
HCT: 33.1 % — ABNORMAL LOW (ref 36.0–46.0)
Hemoglobin: 10.5 g/dL — ABNORMAL LOW (ref 12.0–15.0)
MCH: 25.3 pg — ABNORMAL LOW (ref 26.0–34.0)
MCHC: 31.7 g/dL (ref 30.0–36.0)
MCV: 79.8 fL — ABNORMAL LOW (ref 80.0–100.0)
Platelets: 155 10*3/uL (ref 150–400)
RBC: 4.15 MIL/uL (ref 3.87–5.11)
RDW: 15.2 % (ref 11.5–15.5)
WBC: 4.9 10*3/uL (ref 4.0–10.5)
nRBC: 0 % (ref 0.0–0.2)

## 2021-11-09 MED ORDER — MECLIZINE HCL 25 MG PO TABS: 25.0000 mg | ORAL_TABLET | Freq: Three times a day (TID) | ORAL | 0 refills | Status: DC | PRN

## 2021-11-09 NOTE — ED Triage Notes (Signed)
Patient states she woke up this morning feeling dizzy and nausea.  AAOx3.  Skin warm and dry .NAD  MAE equally and strong.  Speech clear.  Facial movements equal.

## 2021-11-09 NOTE — ED Triage Notes (Signed)
Arrives via [facility]. C/O dizziness and nausea x 1 hour.  Hx CVA in August.  BP 183/93.  CBG:  136.

## 2021-11-09 NOTE — ED Provider Notes (Signed)
[facility] Provider Note    Event Date/Time   First MD Initiated Contact with Patient 11/09/21 1013     (approximate)   History   Dizziness   HPI  Susan Farrell is a 76 y.o. female  with a history of CVA, hypertension, hypothyroidism, hyperlipidemia, anemia, discoid lupus, and AAA status post repair who presents with dizziness, acute onset this morning when she got up out of bed.  She describes it as a sensation of leaning to one side or spinning of the room, and it lasted a short while.  It is now resolved.  The patient denies prior history of similar symptoms.  She did have some associated nausea but no vomiting.  She denies associated headache, vision changes, lightheadedness or near syncope, or any weakness or numbness.  She denies any ear pain or hearing loss.  I reviewed the past medical records.  Per the hospitalist discharge summary from 8/24 the patient was most recent admitted at that time I am with acute onset of left-sided weakness and imaging showing a right basal ganglia infarction.   Physical Exam   Triage Vital Signs: ED Triage Vitals [11/09/21 0715]  Enc Vitals Group     BP (!) 147/71     Pulse Rate 62     Resp 16     Temp (!) 97.5 F (36.4 C)     Temp Source Oral     SpO2 96 %     Weight 164 lb 10.9 oz (74.7 kg)     Height 5\' 6"  (1.676 m)     Head Circumference      Peak Flow      Pain Score 0     Pain Loc      Pain Edu?      Excl. in GC?     Most recent vital signs: Vitals:   11/09/21 1130 11/09/21 1230  BP: (!) 148/77 (!) 158/80  Pulse: 61 66  Resp:  18  Temp:  (!) 97.4 F (36.3 C)  SpO2:  100%     General: Awake, no distress.  CV:  Good peripheral perfusion.  Resp:  Normal effort.  Abd:  No distention.  Other:  Normal ear canals and TMs bilaterally.  EOMI.  PERRLA.  No facial droop.  Cranial nerves III through XII grossly intact.  Motor and sensory intact in all extremities except for mild LLE weakness from  prior stroke.  No ataxia on finger-to-nose.  No pronator drift.   ED Results / Procedures / Treatments   Labs (all labs ordered are listed, but only abnormal results are displayed) Labs Reviewed  BASIC METABOLIC PANEL - Abnormal; Notable for the following components:      Result Value   Potassium 3.1 (*)    Glucose, Bld 119 (*)    All other components within normal limits  CBC - Abnormal; Notable for the following components:   Hemoglobin 10.5 (*)    HCT 33.1 (*)    MCV 79.8 (*)    MCH 25.3 (*)    All other components within normal limits  URINALYSIS, ROUTINE W REFLEX MICROSCOPIC  CBG MONITORING, ED  TROPONIN I (HIGH SENSITIVITY)     EKG  ED ECG REPORT I, 11/11/21, the attending physician, personally viewed and interpreted this ECG.  Date: 11/09/2021 EKG Time: 0718 Rate: 60 Rhythm: normal sinus rhythm QRS Axis: normal Intervals: normal ST/T Wave abnormalities: Nonspecific ST abnormalities inferior lateral Narrative Interpretation: Nonspecific ST abnormalities with  no evidence of acute ischemia; no significant change when compared to EKG of 08/28/2021    RADIOLOGY  CT head: I independently viewed and interpreted the images; there is no ICH.  Radiology report indicates possible small lacunar infarct not seen on CT from August.  PROCEDURES:  Critical Care performed: No  Procedures   MEDICATIONS ORDERED IN ED: Medications - No data to display   IMPRESSION / MDM / [place] / ED COURSE  I reviewed the triage vital signs and the nursing notes.  76 year old female with PMH as noted above presents with acute onset of vertigo type dizziness when she turned in bed and which is now resolved.  She is currently asymptomatic.  Physical exam is unremarkable.  Neurologic exam is nonfocal except for minimal left lower extremity weakness which is chronic for the patient from a prior stroke.  Differential diagnosis includes, but is not limited to,  peripheral vertigo, BPPV, labyrinthitis, less likely near syncope.  Given that the symptoms were precipitated by movement, were sudden and intense, and now completely resolved, this is not consistent with central vertigo.  CBC shows mild anemia.  BMP is normal except for borderline potassium.  Given the patient's age and elevated risk of CNS etiology with recent stroke, I will obtain a CT head.  Patient also has an abnormal EKG with inferior and lateral ST abnormalities although there are no changes when compared to a prior EKG.  We will obtain a single troponin for ACS rule out.  Patient's presentation is most consistent with acute presentation with potential threat to life or bodily function.  The patient is on the cardiac monitor to evaluate for evidence of arrhythmia and/or significant heart rate changes.  ----------------------------------------- 12:36 PM on 11/09/2021 -----------------------------------------  Troponin is negative.  Given that the patient has no chest pain or other cardiac symptoms and it has been around 6 hours since symptom onset, there is no indication for a repeat.  CT head shows a possible small lacunar infarct which is age-indeterminate but per radiology not seen on her prior CT.  I consulted Dr. Malen Gauze from neurology and discussed the case with him.  He reviewed multiple prior imaging studies as well as the current CT.  He advises that the area of concern on the CT today is consistent with a similar area seen on the patient's MRI from several months ago and does not appear to be an acute infarct.  Clinically, there is no evidence of acute stroke given that the patient's symptoms have completely resolved.  Dr. Malen Gauze agrees that the patient does not require further emergent work-up such as MRI.  Urinalysis was not yet obtained, however the patient denies any urinary symptoms, has no leukocytosis, fever, or suprapubic pain, so clinically has no evidence of UTI.  At  this time, the patient is stable for discharge, and she is eager to go home.  I counseled her on the results of the work-up.  She will follow-up with her regular doctor.  She is already on antiplatelet and statin therapy for her prior CVA.  I gave her strict return precautions and she expressed understanding.  I have prescribed meclizine for symptomatic treatment.    FINAL CLINICAL IMPRESSION(S) / ED DIAGNOSES   Final diagnoses:  Peripheral vertigo, unspecified laterality     Rx / DC Orders   ED Discharge Orders          Ordered    meclizine (ANTIVERT) 25 MG tablet  3 times daily PRN  11/09/21 1235             Note:  This document was prepared using Dragon voice recognition software and may include unintentional dictation errors.    Dionne Bucy, MD 11/09/21 [phone]

## 2021-11-09 NOTE — Discharge Instructions (Addendum)
Take the meclizine only if needed for recurrent vertigo.  However, you should return to the emergency department for new, worsening, or persistent severe dizziness or vertigo, lightheadedness, feeling like you are going to pass out, any weakness or numbness, difficulty with speech, changes in your vision, or any other strokelike symptoms.  Follow-up with your regular doctor.

## 2021-11-12 ENCOUNTER — Encounter: Payer: Medicare HMO | Admitting: Occupational Therapy

## 2021-11-12 ENCOUNTER — Ambulatory Visit: Payer: Medicare HMO

## 2021-11-12 DIAGNOSIS — R269 Unspecified abnormalities of gait and mobility: Secondary | ICD-10-CM

## 2021-11-12 DIAGNOSIS — R278 Other lack of coordination: Secondary | ICD-10-CM | POA: Diagnosis not present

## 2021-11-12 DIAGNOSIS — R262 Difficulty in walking, not elsewhere classified: Secondary | ICD-10-CM

## 2021-11-12 DIAGNOSIS — M6281 Muscle weakness (generalized): Secondary | ICD-10-CM

## 2021-11-12 DIAGNOSIS — R2689 Other abnormalities of gait and mobility: Secondary | ICD-10-CM | POA: Diagnosis not present

## 2021-11-12 DIAGNOSIS — I6381 Other cerebral infarction due to occlusion or stenosis of small artery: Secondary | ICD-10-CM | POA: Diagnosis not present

## 2021-11-12 NOTE — Therapy (Signed)
OUTPATIENT PHYSICAL THERAPY NEURO TREATMENT/PHYSICAL THERAPY PROGRESS NOTE   Dates of reporting period  09/18/21   to   11/12/21    Patient Name: Susan Farrell MRN: [id number] DOB:12-Jun-1945, 76 y.o., female Today's Date: 11/12/2021   PCP: Tracie Harrier, MD REFERRING PROVIDER: Cathlyn Parsons, PA-C   PT End of Session - 11/12/21 1205     Visit Number 10    Number of Visits 24    Date for PT Re-Evaluation 12/11/21    Authorization Type [employer]    Authorization Time Period 09/18/21-12/11/21    Progress Note Due on Visit 20    PT Start Time 1145    PT Stop Time 1230    PT Time Calculation (min) 45 min    Equipment Utilized During Treatment Gait belt    Activity Tolerance Patient tolerated treatment well    Behavior During Therapy WFL for tasks assessed/performed             Past Medical History:  Diagnosis Date   Hypertension    Stroke 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 ([place]) 09/02/2021   Hypertensive urgency 08/29/2021   Dyslipidemia 08/29/2021   Hypothyroidism 08/29/2021   CVA (cerebral vascular accident) ([place]) 08/28/2021   Ruptured abdominal aortic aneurysm (AAA) ([place]) 06/12/2020   Anemia 09/13/2019   Goiter 09/13/2019   Osteopenia 09/13/2019    ONSET DATE: 08/28/21  REFERRING DIAG: I63.81 (ICD-10-CM) - 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 ([place])  THERAPY DIAG:  Difficulty in walking, not elsewhere classified  Muscle weakness (generalized)  Abnormality of gait and mobility  Other lack of coordination  Other abnormalities of gait and  mobility  Rationale for Evaluation and Treatment Rehabilitation  SUBJECTIVE:                                                                                                                                                                                              SUBJECTIVE STATEMENT:  Pt reports no new complaints upon arrival is ready to complete her goal assessment left from lat session for her progress note.   PERTINENT HISTORY:  Per Brief HPI from d/c 8/14: DECARLA SIEMEN is a 76 y.o. right-handed female with history of discoid lupus, hypertension,  hyperlipidemia, hypothyroidism, abdominal aortic aneurysm with repair tobacco use.  Per chart review lives with spouse independent prior to admission. Presented to [facility] 08/28/2021 with acute onset of left-sided weakness. 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  Underlying age-related cerebral atrophy with mild chronic small vessel changes.  CT angiogram head and neck showed internal carotid and vertebral arteries to be patent.  No intracranial large vessel occlusion or stenosis.   She did sustain a fall 08/30/2021 when attempting to get out of bed without assistance and no injury sustained.  Therapy evaluations completed due to patient decreased functional mobility was admitted for a comprehensive rehab program.  PAIN:  Are you having pain? No  PRECAUTIONS: Fall  WEIGHT BEARING RESTRICTIONS No  FALLS: Has patient fallen in last 6 months? Yes. Number of falls 1, slipped on towel in hospital and ended up in floor.   LIVING ENVIRONMENT: Lives with: lives with their family and lives with their partner Lives in: House/apartment Stairs: Yes: External: 1 steps; none Has following equipment at home: Dan Humphreys - 2 wheeled, Environmental consultant - 4 wheeled, bed side commode, and only uses bed side commode at night   PLOF: Independent, Independent with basic ADLs, Independent with household mobility without device,  Independent with community mobility without device, Independent with gait, and Leisure: going to church, going to grocery and department stores.   PATIENT GOALS Get back to normal walking and balance without any support.   OBJECTIVE:   FUNCTIONAL TESTs:  5 times sit to stand: 18.02 sec 22 6 minute walk test: 234 feet and requires seated rest break due to lower extremity fatigue on the left lower extremity  Berg Balance Scale: 36  PATIENT SURVEYS:  FOTO 65  TODAY'S TREATMENT:   Therapeutic Activities   Patient ambulated approx 30 min indoor/outdoor today- including tile, concrete, carpet, horizontal head turns, on varied surfaces: concrete, brick, asphalt, pine needles, grass, up ramp, inclines, declines- with one sitting rest break - CGA for all indoor/outdoor - one instance of instability- Focusing on overall endurance and to improve ambulation across uneven terrain that she would come in contact with in the community.  Goal Assessment performed as noted in goal section and utilized values from prior re-certification.     PATIENT EDUCATION: Education details: POC Person educated: Patient Education method: Explanation Education comprehension: verbalized understanding   HOME EXERCISE PROGRAM:  Access Code: [email or web address] URL: [email or web address] Date: 10/23/2021 Prepared by: Tomasa Hose  Exercises - Towel Scrunches  - 1 x daily - 7 x weekly - 3 sets - 10 reps - Ankle Inversion Eversion Towel Slide  - 1 x daily - 7 x weekly - 3 sets - 10 reps - Arch Lifting  - 1 x daily - 7 x weekly - 3 sets - 10 reps - Toe Spreading  - 1 x daily - 7 x weekly - 3 sets - 10 reps   Access Code: [email or web address] URL: [email or web address] Date: 10/01/2021 Prepared by: Tomasa Hose  Exercises - Supine Active Straight Leg Raise  - 1 x daily - 7 x weekly - 3 sets - 10 reps - Sidelying Hip Abduction  - 1 x daily - 7 x weekly - 3 sets - 10 reps - Supine Bridge  - 1 x daily -  7 x weekly - 3 sets - 10 reps - Clamshell  - 1 x daily - 7 x weekly - 3 sets - 10 reps - Standing hip extension  - 1 x daily -  7 x weekly - 3 sets - 10 reps    GOALS:  Goals reviewed with patient? Yes  SHORT TERM GOALS: Target date: 10/16/2021  Patient will be independent in home exercise program to improve strength/mobility for better functional independence with ADLs. Baseline: No HEP currently  Goal status: INITIAL   LONG TERM GOALS: Target date: 12/11/2021  1.  Patient (> 46 years old) will complete five times sit to stand test in < 15 seconds indicating an increased LE strength and improved balance. Baseline: 18 sec  Goal status: Achieved   2.  Patient will increase FOTO score to equal to or greater than  77   to demonstrate statistically significant improvement in mobility and quality of life.  Baseline: 65 Goal status: Progressing    3.  Patient will increase Berg Balance score by > 6 points to demonstrate decreased fall risk during functional activities. Baseline: 36; 10/24:  BERG -  46/56 Goal status: Achieved    4.  Patient will increase 10 meter walk test to >1.49m/s as to improve gait speed for better community ambulation and to reduce fall risk. Baseline: .68m/s; 10/24 - 0.74 m/s Goal status: Progressing   5.  Patient will increase six minute walk test distance to >1000 for progression to community ambulator and improve gait ability Baseline: 234 ft; 740 ft AFO/SBQC Goal status: Progressing    ASSESSMENT:  Pt tolerated ambulation outside across different surfaces well and only requiring one seated rest break.  Pt did have increased difficulty in prolonged ambulation across grassy surface, but was able to safely navigate with only one instance of instability and was able to correct with minA from therapist.  Pt will continue to benefit from challenged gait across varied surfaces in future sessions.   Pt will continue to benefit from skilled therapy to address  remaining deficits in order to improve overall QoL and return to PLOF.        OBJECTIVE IMPAIRMENTS Abnormal gait, decreased activity tolerance, decreased balance, decreased coordination, decreased endurance, decreased mobility, difficulty walking, and decreased strength.   ACTIVITY LIMITATIONS lifting, bending, standing, squatting, stairs, dressing, and caring for others  PARTICIPATION LIMITATIONS: meal prep, cleaning, driving, shopping, yard work, and church  PERSONAL FACTORS Age are also affecting patient's functional outcome.   REHAB POTENTIAL: Good  CLINICAL DECISION MAKING: Evolving/moderate complexity  EVALUATION COMPLEXITY: Low  PLAN: PT FREQUENCY: 2x/week  PT DURATION: 12 weeks  PLANNED INTERVENTIONS: Therapeutic exercises, Therapeutic activity, Neuromuscular re-education, Balance training, Gait training, Patient/Family education, Self Care, Joint mobilization, Dry Needling, and Manual therapy  PLAN FOR NEXT SESSION: progress balance exercises and continue working on ambulation and exercises without AFO.  Nolon Bussing, PT, DPT Physical Therapist- [facility]  11/12/21, 12:56 PM

## 2021-11-14 ENCOUNTER — Encounter: Payer: Self-pay | Admitting: Physical Medicine & Rehabilitation

## 2021-11-14 ENCOUNTER — Encounter: Payer: Medicare HMO | Attending: Registered Nurse | Admitting: Physical Medicine & Rehabilitation

## 2021-11-14 ENCOUNTER — Ambulatory Visit: Payer: Medicare HMO

## 2021-11-14 VITALS — BP 129/70 | HR 60 | Ht 66.0 in | Wt 160.0 lb

## 2021-11-14 DIAGNOSIS — I6381 Other cerebral infarction due to occlusion or stenosis of small artery: Secondary | ICD-10-CM | POA: Insufficient documentation

## 2021-11-14 NOTE — Progress Notes (Signed)
Subjective:    Patient ID: Susan Farrell, female    DOB: Apr 24, 1945, 76 y.o.   MRN: [id number] Admit date: 09/02/2021 Discharge date: 09/13/2021 76 y.o. right-handed female with history of discoid lupus, hypertension, hyperlipidemia, hypothyroidism, abdominal aortic aneurysm with repair tobacco use.  Per chart review lives with spouse independent prior to admission.  Presented to [facility] 08/28/2021 with acute onset of left-sided weakness.  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  Underlying age-related cerebral atrophy with mild chronic small vessel changes.  CT angiogram head and neck showed internal carotid and vertebral arteries to be patent.  No intracranial large vessel occlusion or stenosis.  Admission chemistries unremarkable except glucose 112 hemoglobin A1c 5.7.  Echocardiogram with ejection fraction of 60 to 65% no wall motion abnormalities grade 1 diastolic dysfunction.  Maintained on low-dose aspirin as well as Plavix x3 weeks then aspirin alone.  Subcutaneous Lovenox for DVT prophylaxis.  She did sustain a fall 08/30/2021 when attempting to get out of bed without assistance and no injury sustained.  Therapy evaluations completed due to patient decreased functional mobility was admitted for a comprehensive rehab program. HPI  Patient returns to clinic today.  She was seen approximately 6 weeks ago by nurse practitioner.  She has completed outpatient OT and still goes to outpatient PT but is likely going to be finishing in November. She has had no falls.  She is using a cane to ambulate.  She still uses left AFO She has had very good recovery of her left upper extremity.  No issues with pain.  Pain Inventory Average Pain 0 Pain Right Now 0 My pain is  NO PAIN  LOCATION OF PAIN  NO PAIN, (leg stiffness)  BOWEL Number of stools per week: 7 Oral laxative use Yes  Type of laxative Colace  BLADDER Normal     Mobility use a cane use a  walker ability to climb steps?  yes do you drive?  yes Do you have any goals in this area?  yes  Function retired Do you have any goals in this area?  yes  Neuro/Psych trouble walking  Prior Studies Any changes since last visit?  yes, CT at [facility]  Physicians involved in your care Any changes since last visit?  no   Family History  Problem Relation Age of Onset   Breast cancer Neg Hx    Social History   Socioeconomic History   Marital status: Married    Spouse name: Not on file   Number of children: Not on file   Years of education: Not on file   Highest education level: Not on file  Occupational History   Not on file  Tobacco Use   Smoking status: Every Day    Types: Cigarettes   Smokeless tobacco: Current  Vaping Use   Vaping Use: Never used  Substance and Sexual Activity   Alcohol use: Not Currently   Drug use: Never   Sexual activity: Yes  Other Topics Concern   Not on file  Social History Narrative   Not on file   Social Determinants of Health   Financial Resource Strain: Not on file  Food Insecurity: Not on file  Transportation Needs: Not on file  Physical Activity: Not on file  Stress: Not on file  Social Connections: Not on file   Past Surgical History:  Procedure Laterality Date   ABDOMINAL HYSTERECTOMY     CHOLECYSTECTOMY     ENDOVASCULAR REPAIR/STENT  GRAFT N/A 06/12/2020   Procedure: ENDOVASCULAR REPAIR/STENT GRAFT;  Surgeon: Katha Cabal, MD;  Location: [place] [facility];  Service: Cardiovascular;  Laterality: N/A;   Past Medical History:  Diagnosis Date   Hypertension    Stroke ([place])    BP 129/70 Comment: right arm  Pulse 60   Ht 5\' 6"  (1.676 m)   Wt 160 lb (72.6 kg)   SpO2 97%   BMI 25.82 kg/m   Opioid Risk Score:   Fall Risk Score:  `1  Depression screen [facility] 2/9     09/25/2021    1:04 PM 09/25/2021    1:01 PM  Depression screen PHQ 2/9  Decreased Interest 0 0  Down, Depressed, Hopeless 0 0  PHQ - 2 Score  0 0  Altered sleeping 0   Tired, decreased energy 0   Change in appetite 0   Feeling bad or failure about yourself  0   Trouble concentrating 0   Moving slowly or fidgety/restless 0   Suicidal thoughts 0   PHQ-9 Score 0   Difficult doing work/chores Not difficult at all     Review of Systems  Musculoskeletal:  Positive for gait problem.       Objective:   Physical Exam  Motor strength is 5/5 bilateral deltoid, bicep, tricep, grip, Negative straight leg raise bilaterally Motor strength in right lower extremity is 5/5 in hip flexor knee extensor ankle dorsiflexion Left lower extremity strength is 3 - hip flexor and needs tensor to minus ankle dorsiflexor 3 at the ankle plantar flexor.  Ambulates with a cane no evidence of toe drag or knee instability while using the left AFO Mood and affect are appropriate Speech without dysarthria or aphasia.      Assessment & Plan:  #40.  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  This is primarily affecting her left lower extremity at this point.  She will finish out physical therapy. She is doing well from a functional standpoint I anticipate she should be able to get off the AFO as well as the cane once she completes physical therapy. I do not think she needs physical medicine and rehab follow-up.  She will follow-up primary care as well as neurology.

## 2021-11-14 NOTE — Progress Notes (Deleted)
   Subjective:    Patient ID: Susan Farrell, female    DOB: Jan 09, 1946, 76 y.o.   MRN: [id number]  HPI   .CPR Review of Systems     Objective:   Physical Exam        Assessment & Plan:

## 2021-11-14 NOTE — Patient Instructions (Signed)

## 2021-11-17 NOTE — Therapy (Signed)
OUTPATIENT PHYSICAL THERAPY NEURO TREATMENT NOTE    Patient Name: Susan Farrell MRN: [id number] DOB:11/07/1945, 76 y.o., female Today's Date: 11/18/2021   PCP: Barbette Reichmann, MD REFERRING PROVIDER: Charlton Amor, PA-C   PT End of Session - 11/18/21 0805     Visit Number 11    Number of Visits 24    Date for PT Re-Evaluation 12/11/21    Authorization Type [employer]    Authorization Time Period 09/18/21-12/11/21    Progress Note Due on Visit 20    PT Start Time 0803    PT Stop Time 0845    PT Time Calculation (min) 42 min    Equipment Utilized During Treatment Gait belt    Activity Tolerance Patient tolerated treatment well    Behavior During Therapy WFL for tasks assessed/performed              Past Medical History:  Diagnosis Date   Hypertension    Stroke [facility])    Past Surgical History:  Procedure Laterality Date   ABDOMINAL HYSTERECTOMY     CHOLECYSTECTOMY     ENDOVASCULAR REPAIR/STENT GRAFT N/A 06/12/2020   Procedure: ENDOVASCULAR REPAIR/STENT GRAFT;  Surgeon: Renford Dills, MD;  Location: [facility];  Service: Cardiovascular;  Laterality: N/A;   Patient Active Problem List   Diagnosis Date Noted   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) 09/02/2021   Hypertensive urgency 08/29/2021   Dyslipidemia 08/29/2021   Hypothyroidism 08/29/2021   CVA (cerebral vascular accident) ([facility]) 08/28/2021   Ruptured abdominal aortic aneurysm (AAA) ([facility]) 06/12/2020   Anemia 09/13/2019   Goiter 09/13/2019   Osteopenia 09/13/2019    ONSET DATE: 08/28/21  REFERRING DIAG: I63.81 (ICD-10-CM) - 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  Pt notes that he told her she did not have to return to him unless she had some complications going forward.  Pt and MD happy with progress she has made in therapy thus far.   PERTINENT HISTORY:  Per Brief HPI  from d/c 8/14: AKANSHA WYCHE is a 76 y.o. right-handed female with history of discoid lupus, hypertension, hyperlipidemia, hypothyroidism, abdominal aortic aneurysm with repair tobacco use.  Per chart review lives with spouse independent prior to admission. Presented to [facility] 08/28/2021 with acute onset of left-sided weakness. 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  Underlying age-related cerebral atrophy with mild chronic small vessel changes.  CT angiogram head and neck showed internal carotid and vertebral arteries to be patent.  No intracranial large vessel occlusion or stenosis.   She did sustain a fall 08/30/2021 when attempting to get out of bed without assistance and no injury sustained.  Therapy evaluations completed due to patient decreased functional mobility was admitted for a comprehensive rehab program.  PAIN:  Are you having pain? No  PRECAUTIONS: Fall  WEIGHT BEARING RESTRICTIONS No  FALLS: Has patient fallen in last 6 months? Yes. Number of falls 1, slipped on towel in hospital and ended up in floor.   LIVING ENVIRONMENT: Lives with: lives with their family and lives with their partner Lives in: House/apartment Stairs: Yes: External: 1 steps; none Has following equipment at home: Gilford Rile - 2 wheeled, Environmental consultant - 4 wheeled, bed side commode, and only uses bed side commode at night   PLOF: Independent, Independent with basic  ADLs, Independent with household mobility without device, Independent with community mobility without device, Independent with gait, and Leisure: going to church, going to grocery and department stores.   PATIENT GOALS Get back to normal walking and balance without any support.   OBJECTIVE:   FUNCTIONAL TESTs:  5 times sit to stand: 18.02 sec 22 6 minute walk test: 234 feet and requires seated rest break due to lower extremity fatigue on the left lower extremity  Berg Balance Scale: 36  PATIENT SURVEYS:  FOTO 65  TODAY'S TREATMENT:    There.Ex:    Seated without AFO:   Heel raises, 4# AW donned bilaterally, 2x15 LAQ, 4# AW donned bilaterally, 2x10 Marches,  4# AW donned bilaterally, 2x10 Lateral step-over hedgehogs,  4# AW donned bilaterally, 2x10  In // bars: Squats with UE use on the TRX straps, 2x15 with verbal cues for improved weight displacement on the L ankle  Forward golf reach to grab cone, 2x10 pt having increased difficulty due to ankle instability and she utilizes trunk flexion more than keeping lumbar spine and counterweight to perform  Education on STS exercise for improve CKC quad and glute strength to assist in ankle stability with gait tasks.   Neuro Re-Ed:   Airex pad exercises: CGA  Narrow BOS: x1 min, min ankle strategy  Narrow BOS, eyes closed: 3x30 sec, ankle/hip strategy, intermittent need for UE support.  Tandem stance on airex pads alternating leading LE, 30 sec bouts with increased difficulty when L LE was supportive LE (behind) SLS 30 sec bouts each LE, L LE requiring minimal UE assist   Gait without AFO with verbal cues for consistent LLE heel strike for foot clearance and quad engagement. 300' CGA. Minimal push off noted.    PATIENT EDUCATION: Education details: POC Person educated: Patient Education method: Explanation Education comprehension: verbalized understanding   HOME EXERCISE PROGRAM:  Access Code: [email or web address] URL:  [email or web address] Date: 10/23/2021 Prepared by: Devon Nation  Exercises - Towel Scrunches  - 1 x daily - 7 x weekly - 3 sets - 10 reps - Ankle Inversion Eversion Towel Slide  - 1 x daily - 7 x weekly - 3 sets -  10 reps - Arch Lifting  - 1 x daily - 7 x weekly - 3 sets - 10 reps - Toe Spreading  - 1 x daily - 7 x weekly - 3 sets - 10 reps   Access Code: [email or web address] URL: [email or web address] Date: 10/01/2021 Prepared by: Tomasa Hose  Exercises - Supine Active Straight Leg Raise  - 1 x daily - 7 x weekly - 3 sets - 10 reps - Sidelying Hip Abduction  - 1 x daily - 7 x weekly - 3 sets - 10 reps - Supine Bridge  - 1 x daily - 7 x weekly - 3 sets - 10 reps - Clamshell  - 1 x daily - 7 x weekly - 3 sets - 10 reps - Standing hip extension  - 1 x daily - 7 x weekly - 3 sets - 10 reps    GOALS:  Goals reviewed with patient? Yes  SHORT TERM GOALS: Target date: 10/16/2021  Patient will be independent in home exercise program to improve strength/mobility for better functional independence with ADLs. Baseline: No HEP currently  Goal status: INITIAL   LONG TERM GOALS: Target date: 12/11/2021  1.  Patient (> 75 years old) will complete five times sit to stand test in < 15 seconds indicating an increased LE strength and improved balance. Baseline: 18 sec  Goal status: Achieved   2.  Patient will increase FOTO score to equal to or greater than  77   to demonstrate statistically significant improvement in mobility and quality of life.  Baseline: 65 Goal status: Progressing    3.  Patient will increase Berg Balance score by > 6 points to demonstrate decreased fall risk during functional activities. Baseline: 36; 10/24:  BERG -  46/56 Goal status: Achieved    4.  Patient will increase 10 meter walk test to >1.10m/s as to improve gait speed for better community ambulation and to reduce fall risk. Baseline: .8m/s; 10/24 - 0.74 m/s Goal status:  Progressing   5.  Patient will increase six minute walk test distance to >1000 for progression to community ambulator and improve gait ability Baseline: 234 ft; 740 ft AFO/SBQC Goal status: Progressing    ASSESSMENT:  Pt is continuing to make marked progress with the current set of stability exercises.  Pt introduced to more dynamic exercises that continue to challenge the ankle musculature including the TRX squats.  This places adequate weight throughout the ankle and posterior chain of exercises.  Pt will continue to benefit from ankle stability exercises as therapy progresses.   Pt will continue to benefit from skilled therapy to address remaining deficits in order to improve overall QoL and return to PLOF.       OBJECTIVE IMPAIRMENTS Abnormal gait, decreased activity tolerance, decreased balance, decreased coordination, decreased endurance, decreased mobility, difficulty walking, and decreased strength.   ACTIVITY LIMITATIONS lifting, bending, standing, squatting, stairs, dressing, and caring for others  PARTICIPATION LIMITATIONS: meal prep, cleaning, driving, shopping, yard work, and church  PERSONAL FACTORS Age are also affecting patient's functional outcome.   REHAB POTENTIAL: Good  CLINICAL DECISION MAKING: Evolving/moderate complexity  EVALUATION COMPLEXITY: Low  PLAN: PT FREQUENCY: 2x/week  PT DURATION: 12 weeks  PLANNED INTERVENTIONS: Therapeutic exercises, Therapeutic activity, Neuromuscular re-education, Balance training, Gait training, Patient/Family education, Self Care, Joint mobilization, Dry Needling, and Manual therapy  PLAN FOR NEXT SESSION: progress balance exercises and continue working on ambulation and exercises without AFO.  Nolon Bussing, PT, DPT Physical Therapist- [facility]  11/18/21, 10:06 AM

## 2021-11-18 ENCOUNTER — Encounter: Payer: Medicare HMO | Admitting: Occupational Therapy

## 2021-11-18 ENCOUNTER — Ambulatory Visit: Payer: Medicare HMO

## 2021-11-18 DIAGNOSIS — R262 Difficulty in walking, not elsewhere classified: Secondary | ICD-10-CM | POA: Diagnosis not present

## 2021-11-18 DIAGNOSIS — I6381 Other cerebral infarction due to occlusion or stenosis of small artery: Secondary | ICD-10-CM | POA: Diagnosis not present

## 2021-11-18 DIAGNOSIS — R278 Other lack of coordination: Secondary | ICD-10-CM | POA: Diagnosis not present

## 2021-11-18 DIAGNOSIS — M6281 Muscle weakness (generalized): Secondary | ICD-10-CM | POA: Diagnosis not present

## 2021-11-18 DIAGNOSIS — R2689 Other abnormalities of gait and mobility: Secondary | ICD-10-CM | POA: Diagnosis not present

## 2021-11-18 DIAGNOSIS — R269 Unspecified abnormalities of gait and mobility: Secondary | ICD-10-CM

## 2021-11-19 NOTE — Therapy (Signed)
OUTPATIENT PHYSICAL THERAPY NEURO TREATMENT NOTE    Patient Name: Susan Farrell MRN: [id number] DOB:May 21, 1945, 76 y.o., female Today's Date: 11/20/2021   PCP: Barbette Reichmann, MD REFERRING PROVIDER: Charlton Amor, PA-C   PT End of Session - 11/20/21 0806     Visit Number 13    Number of Visits 24    Date for PT Re-Evaluation 12/11/21    Authorization Type [employer]    Authorization Time Period 09/18/21-12/11/21    Progress Note Due on Visit 20    PT Start Time 0803    PT Stop Time 0845    PT Time Calculation (min) 42 min    Equipment Utilized During Treatment Gait belt    Activity Tolerance Patient tolerated treatment well    Behavior During Therapy [facility] for tasks assessed/performed               Past Medical History:  Diagnosis Date   Hypertension    Stroke [facility])    Past Surgical History:  Procedure Laterality Date   ABDOMINAL HYSTERECTOMY     CHOLECYSTECTOMY     ENDOVASCULAR REPAIR/STENT GRAFT N/A 06/12/2020   Procedure: ENDOVASCULAR REPAIR/STENT GRAFT;  Surgeon: Renford Dills, MD;  Location: [facility] INVASIVE CV LAB;  Service: Cardiovascular;  Laterality: N/A;   Patient Active Problem List   Diagnosis Date Noted   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) 09/02/2021   Hypertensive urgency 08/29/2021   Dyslipidemia 08/29/2021   Hypothyroidism 08/29/2021   CVA (cerebral vascular accident) (HCC) 08/28/2021   Ruptured abdominal aortic aneurysm (AAA) (HCC) 06/12/2020   Anemia 09/13/2019   Goiter 09/13/2019   Osteopenia 09/13/2019    ONSET DATE: 08/28/21  REFERRING DIAG: I63.81 (ICD-10-CM) - 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)  THERAPY DIAG:  Difficulty in walking, not elsewhere classified  Muscle weakness (generalized)  Abnormality of gait and mobility  Other lack of coordination  Other abnormalities of gait and mobility  Rationale for Evaluation and Treatment Rehabilitation  SUBJECTIVE:                                                                                                                                                                                              SUBJECTIVE STATEMENT:  Pt notes she is feeling great.  She had an uneventful Halloween since there were no trick-or-treaters.   PERTINENT HISTORY:  Per Brief HPI from d/c 8/14: Susan Farrell is a 76 y.o. right-handed female with history of discoid lupus, hypertension, hyperlipidemia, hypothyroidism, abdominal aortic aneurysm with repair tobacco use.  Per chart review lives with spouse independent prior to admission.  [facility]ed to [facility]/28/2021 with acute onset of left-sided weakness. 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  Underlying age-related cerebral atrophy with mild chronic small vessel changes.  CT angiogram head and neck showed internal carotid and vertebral arteries to be patent.  No intracranial large vessel occlusion or stenosis.   She did sustain a fall 08/30/2021 when attempting to get out of bed without assistance and no injury sustained.  Therapy evaluations completed due to patient decreased functional mobility was admitted for a comprehensive rehab program.  PAIN:  Are you having pain? No  PRECAUTIONS: Fall  WEIGHT BEARING RESTRICTIONS No  FALLS: Has patient fallen in last 6 months? Yes. Number of falls 1, slipped on towel in hospital and ended up in floor.   LIVING ENVIRONMENT: Lives with: lives with their family and lives with their partner Lives in: House/apartment Stairs: Yes: External: 1 steps; none Has following equipment at home: Dan Humphreys - 2 wheeled, Environmental consultant - 4 wheeled, bed side commode, and only uses bed side commode at night   PLOF: Independent, Independent with basic ADLs, Independent with household mobility without device, Independent with community mobility without device, Independent with gait, and Leisure: going to church, going to  grocery and department stores.   PATIENT GOALS Get back to normal walking and balance without any support.   OBJECTIVE:   FUNCTIONAL TESTs:  5 times sit to stand: 18.02 sec 22 6 minute walk test: 234 feet and requires seated rest break due to lower extremity fatigue on the left lower extremity  Berg Balance Scale: 36  PATIENT SURVEYS:  FOTO 65  TODAY'S TREATMENT:   There.Ex:    NuStep, Seated 7, No UE use, Level 3, 5 min for increased cardiovascular function and improving L ankle ROM  Seated without AFO:   Heel raises, 10# applied to distal thigh, 2x15 Ankle inversion/eversion on towel, 2x15 each direction STS with small physioball between knees to prevent genu valgum of the L LE, x10   Neuro Re-Ed:  Gait without AFO, use of Hurrycane, and provided verbal cues for consistent LLE heel strike for foot clearance and quad engagement x5 laps Backwards ambulation for posterior chain activation and improved muscle activation, x2 length of hallway   PATIENT EDUCATION: Education details: POC Person educated: Patient Education method: Explanation Education comprehension: verbalized understanding   HOME EXERCISE PROGRAM:  Access Code: [email or web address] URL: [email or web address] Date: 10/23/2021 Prepared by: Tomasa Hose  Exercises - Towel Scrunches  - 1 x daily - 7 x weekly - 3 sets - 10 reps - Ankle Inversion Eversion Towel Slide  - 1 x daily - 7 x weekly - 3 sets - 10 reps - Arch Lifting  - 1 x daily - 7 x weekly - 3 sets - 10 reps - Toe Spreading  - 1 x daily - 7 x weekly - 3 sets - 10 reps   Access Code: [email or web address] URL: [email or web address] Date: 10/01/2021 Prepared by: Tomasa Hose  Exercises - Supine Active Straight Leg Raise  - 1 x daily - 7 x weekly - 3 sets - 10 reps - Sidelying Hip Abduction  - 1 x daily - 7 x weekly - 3 sets - 10 reps - Supine Bridge  - 1 x daily - 7 x weekly - 3 sets - 10 reps - Clamshell  - 1 x daily - 7 x weekly - 3  sets - 10 reps - Standing hip extension  - 1 x daily - 7 x weekly -  3 sets - 10 reps    GOALS:  Goals reviewed with patient? Yes  SHORT TERM GOALS: Target date: 10/16/2021  Patient will be independent in home exercise program to improve strength/mobility for better functional independence with ADLs. Baseline: No HEP currently  Goal status: INITIAL   LONG TERM GOALS: Target date: 12/11/2021  1.  Patient (> 84 years old) will complete five times sit to stand test in < 15 seconds indicating an increased LE strength and improved balance. Baseline: 18 sec  Goal status: Achieved   2.  Patient will increase FOTO score to equal to or greater than  77   to demonstrate statistically significant improvement in mobility and quality of life.  Baseline: 65 Goal status: Progressing    3.  Patient will increase Berg Balance score by > 6 points to demonstrate decreased fall risk during functional activities. Baseline: 36; 10/24:  BERG -  46/56 Goal status: Achieved    4.  Patient will increase 10 meter walk test to >1.8m/s as to improve gait speed for better community ambulation and to reduce fall risk. Baseline: .58m/s; 10/24 - 0.74 m/s Goal status: Progressing   5.  Patient will increase six minute walk test distance to >1000 for progression to community ambulator and improve gait ability Baseline: 234 ft; 740 ft AFO/SBQC Goal status: Progressing    ASSESSMENT:  Pt performed well with the use of the hurrycane and pt was pleased with her response to the new AD.  She plans on purchasing one for personal use due to how confident she felt with it.  Pt does continue to have some difficulty with backwards ambulation due to muscle fatigue and knee buckling due to lack of control surrounding the knee.  Pt to benefit from continued LE strengthening in order to improve ambulation and current level of function.   Pt will continue to benefit from skilled therapy to address remaining deficits in  order to improve overall QoL and return to PLOF.      OBJECTIVE IMPAIRMENTS Abnormal gait, decreased activity tolerance, decreased balance, decreased coordination, decreased endurance, decreased mobility, difficulty walking, and decreased strength.   ACTIVITY LIMITATIONS lifting, bending, standing, squatting, stairs, dressing, and caring for others  PARTICIPATION LIMITATIONS: meal prep, cleaning, driving, shopping, yard work, and church  PERSONAL FACTORS Age are also affecting patient's functional outcome.   REHAB POTENTIAL: Good  CLINICAL DECISION MAKING: Evolving/moderate complexity  EVALUATION COMPLEXITY: Low  PLAN: PT FREQUENCY: 2x/week  PT DURATION: 12 weeks  PLANNED INTERVENTIONS: Therapeutic exercises, Therapeutic activity, Neuromuscular re-education, Balance training, Gait training, Patient/Family education, Self Care, Joint mobilization, Dry Needling, and Manual therapy  PLAN FOR NEXT SESSION: progress balance exercises and continue working on ambulation and exercises without AFO.  Assess new hurrycane if patient brings in.    Gwenlyn Saran, PT, DPT Physical Therapist- [facility]  11/20/21, 9:04 AM

## 2021-11-20 ENCOUNTER — Ambulatory Visit: Payer: Medicare HMO | Attending: Physician Assistant

## 2021-11-20 DIAGNOSIS — I6381 Other cerebral infarction due to occlusion or stenosis of small artery: Secondary | ICD-10-CM | POA: Insufficient documentation

## 2021-11-20 DIAGNOSIS — R2689 Other abnormalities of gait and mobility: Secondary | ICD-10-CM | POA: Insufficient documentation

## 2021-11-20 DIAGNOSIS — M6281 Muscle weakness (generalized): Secondary | ICD-10-CM | POA: Insufficient documentation

## 2021-11-20 DIAGNOSIS — R278 Other lack of coordination: Secondary | ICD-10-CM | POA: Diagnosis not present

## 2021-11-20 DIAGNOSIS — R269 Unspecified abnormalities of gait and mobility: Secondary | ICD-10-CM | POA: Diagnosis not present

## 2021-11-20 DIAGNOSIS — R262 Difficulty in walking, not elsewhere classified: Secondary | ICD-10-CM | POA: Insufficient documentation

## 2021-11-27 ENCOUNTER — Ambulatory Visit: Payer: Medicare HMO

## 2021-11-27 ENCOUNTER — Encounter: Payer: Medicare HMO | Admitting: Occupational Therapy

## 2021-11-27 DIAGNOSIS — M6281 Muscle weakness (generalized): Secondary | ICD-10-CM | POA: Diagnosis not present

## 2021-11-27 DIAGNOSIS — R269 Unspecified abnormalities of gait and mobility: Secondary | ICD-10-CM

## 2021-11-27 DIAGNOSIS — R278 Other lack of coordination: Secondary | ICD-10-CM | POA: Diagnosis not present

## 2021-11-27 DIAGNOSIS — R262 Difficulty in walking, not elsewhere classified: Secondary | ICD-10-CM | POA: Diagnosis not present

## 2021-11-27 DIAGNOSIS — R2689 Other abnormalities of gait and mobility: Secondary | ICD-10-CM | POA: Diagnosis not present

## 2021-11-27 DIAGNOSIS — I6381 Other cerebral infarction due to occlusion or stenosis of small artery: Secondary | ICD-10-CM | POA: Diagnosis not present

## 2021-11-27 NOTE — Therapy (Signed)
OUTPATIENT PHYSICAL THERAPY NEURO TREATMENT NOTE    Patient Name: Susan Farrell MRN: [id number] DOB:January 31, 1945, 76 y.o., female Today's Date: 11/27/2021   PCP: Tracie Harrier, MD REFERRING PROVIDER: Cathlyn Parsons, PA-C       Past Medical History:  Diagnosis Date   Hypertension    Stroke [facility])    Past Surgical History:  Procedure Laterality Date   ABDOMINAL HYSTERECTOMY     CHOLECYSTECTOMY     ENDOVASCULAR REPAIR/STENT GRAFT N/A 06/12/2020   Procedure: ENDOVASCULAR REPAIR/STENT GRAFT;  Surgeon: Katha Cabal, MD;  Location: [place] CV LAB;  Service: Cardiovascular;  Laterality: N/A;   Patient Active Problem List   Diagnosis Date Noted   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 ([place]) 09/02/2021   Hypertensive urgency 08/29/2021   Dyslipidemia 08/29/2021   Hypothyroidism 08/29/2021   CVA (cerebral vascular accident) ([place]) 08/28/2021   Ruptured abdominal aortic aneurysm (AAA) ([place]) 06/12/2020   Anemia 09/13/2019   Goiter 09/13/2019   Osteopenia 09/13/2019    ONSET DATE: 08/28/21  REFERRING DIAG: I63.81 (ICD-10-CM) - 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)  THERAPY DIAG:  No diagnosis found.  Rationale for Evaluation and Treatment Rehabilitation  SUBJECTIVE:                                                                                                                                                                                             SUBJECTIVE STATEMENT:  Pt states she had a good birthday weekend.  Pt notes that she ate real good and received flowers and balloons, and money for her birthday.  Pt also brought in her hurrycane.  She mentions again that she will likely stop following this current POC due to the progress that she's making.   PERTINENT HISTORY:  Per Brief HPI from d/c 8/14: SHANYGNE VELEZ is a 76 y.o. right-handed female with history of discoid lupus, hypertension, hyperlipidemia,  hypothyroidism, abdominal aortic aneurysm with repair tobacco use.  Per chart review lives with spouse independent prior to admission. Presented to [facility] 08/28/2021 with acute onset of left-sided weakness. 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  Underlying age-related cerebral atrophy with mild chronic small vessel changes.  CT angiogram head and neck showed internal carotid and vertebral arteries to be patent.  No intracranial large vessel occlusion or stenosis.   She did sustain a fall 08/30/2021 when attempting to get out of bed without assistance and no injury sustained.  Therapy evaluations completed due to patient decreased functional mobility was admitted for a comprehensive rehab program.  PAIN:  Are you having pain? No  PRECAUTIONS: Fall  WEIGHT BEARING RESTRICTIONS No  FALLS: Has patient fallen in last 6 months? Yes. Number of falls 1, slipped on towel in hospital and ended up in floor.   LIVING ENVIRONMENT: Lives with: lives with their family and lives with their partner Lives in: House/apartment Stairs: Yes: External: 1 steps; none Has following equipment at home: Dan Humphreys - 2 wheeled, Environmental consultant - 4 wheeled, bed side commode, and only uses bed side commode at night   PLOF: Independent, Independent with basic ADLs, Independent with household mobility without device, Independent with community mobility without device, Independent with gait, and Leisure: going to church, going to grocery and department stores.   PATIENT GOALS Get back to normal walking and balance without any support.   OBJECTIVE:   FUNCTIONAL TESTs:  5 times sit to stand: 18.02 sec 22 6 minute walk test: 234 feet and requires seated rest break due to lower extremity fatigue on the left lower extremity  Berg Balance Scale: 36  PATIENT SURVEYS:  FOTO 65  TODAY'S TREATMENT:   There.Ex:   NuStep, Seated 7, No UE use, Level 3, 5 min for increased cardiovascular function and  improving L ankle ROM  Seated without AFO:  Heel raises, 10# applied to distal thigh, 2x15 Seated hamstring curls, BTB, 2x15 each LE  Pt requires eccentric contraction prior to attempted pulling in order to perform. Standing hamstring curls with eccentric lowering, 2x15  Pt unable to perform full ROM against gravity, so therapist pulled LE into full knee flexion and pt slowly lowered.  Would benefit from more basic level hamstring activation during next sessions.   Neuro Re-Ed:  Korebalance with Tux Racer for one series of races, 3 attempts Korebalance with [place]ball, Levels 1-3 until pt was able to achieve all the goals Gait without AFO, use of Hurrycane, and provided verbal cues for consistent LLE heel strike for foot clearance and quad engagement x2 laps Backwards ambulation for posterior chain activation and improved muscle activation, x2 length of hallway   PATIENT EDUCATION: Education details: POC Person educated: Patient Education method: Explanation Education comprehension: verbalized understanding   HOME EXERCISE PROGRAM:  Access Code: [email or web address] URL: [email or web address] Date: 10/23/2021 Prepared by: Tomasa Hose  Exercises - Towel Scrunches  - 1 x daily - 7 x weekly - 3 sets - 10 reps - Ankle Inversion Eversion Towel Slide  - 1 x daily - 7 x weekly - 3 sets - 10 reps - Arch Lifting  - 1 x daily - 7 x weekly - 3 sets - 10 reps - Toe Spreading  - 1 x daily - 7 x weekly - 3 sets - 10 reps   Access Code: [email or web address] URL: [email or web address] Date: 10/01/2021 Prepared by: Tomasa Hose  Exercises - Supine Active Straight Leg Raise  - 1 x daily - 7 x weekly - 3 sets - 10 reps - Sidelying Hip Abduction  - 1 x daily - 7 x weekly - 3 sets - 10 reps - Supine Bridge  - 1 x daily - 7 x weekly - 3 sets - 10 reps - Clamshell  - 1 x daily - 7 x weekly - 3 sets - 10 reps - Standing hip extension  - 1 x daily - 7 x weekly - 3 sets - 10  reps    GOALS:  Goals reviewed with patient? Yes  SHORT TERM GOALS: Target date: 10/16/2021  Patient will be independent in home exercise program to improve strength/mobility for better functional independence with ADLs. Baseline:  No HEP currently  Goal status: INITIAL   LONG TERM GOALS: Target date: 12/11/2021  1.  Patient (> 39 years old) will complete five times sit to stand test in < 15 seconds indicating an increased LE strength and improved balance. Baseline: 18 sec  Goal status: Achieved   2.  Patient will increase FOTO score to equal to or greater than  77   to demonstrate statistically significant improvement in mobility and quality of life.  Baseline: 65 Goal status: Progressing    3.  Patient will increase Berg Balance score by > 6 points to demonstrate decreased fall risk during functional activities. Baseline: 36; 10/24:  BERG -  46/56 Goal status: Achieved    4.  Patient will increase 10 meter walk test to >1.66m/s as to improve gait speed for better community ambulation and to reduce fall risk. Baseline: .26m/s; 10/24 - 0.74 m/s Goal status: Progressing   5.  Patient will increase six minute walk test distance to >1000 for progression to community ambulator and improve gait ability Baseline: 234 ft; 740 ft AFO/SBQC Goal status: Progressing    ASSESSMENT:  Pt continues to put forth great effort throughout the sessions.  Pt does still have significant hamstring weakness/activation at times and would benefit from easier exercises in order to achieve greater activation.  Will likely attempt a supine heel dig into dynadisc to activate the hamstrings during future sessions.   Pt will continue to benefit from skilled therapy to address remaining deficits in order to improve overall QoL and return to PLOF.      OBJECTIVE IMPAIRMENTS Abnormal gait, decreased activity tolerance, decreased balance, decreased coordination, decreased endurance, decreased mobility,  difficulty walking, and decreased strength.   ACTIVITY LIMITATIONS lifting, bending, standing, squatting, stairs, dressing, and caring for others  PARTICIPATION LIMITATIONS: meal prep, cleaning, driving, shopping, yard work, and church  PERSONAL FACTORS Age are also affecting patient's functional outcome.   REHAB POTENTIAL: Good  CLINICAL DECISION MAKING: Evolving/moderate complexity  EVALUATION COMPLEXITY: Low  PLAN: PT FREQUENCY: 2x/week  PT DURATION: 12 weeks  PLANNED INTERVENTIONS: Therapeutic exercises, Therapeutic activity, Neuromuscular re-education, Balance training, Gait training, Patient/Family education, Self Care, Joint mobilization, Dry Needling, and Manual therapy  PLAN FOR NEXT SESSION: hamstring activation exercises. progress balance exercises and continue working on ambulation and exercises without AFO.  Assess new hurrycane if patient brings in.    Nolon Bussing, PT, DPT Physical Therapist- [facility]  11/27/21, 8:19 AM

## 2021-11-29 ENCOUNTER — Ambulatory Visit: Payer: Medicare HMO | Admitting: Physical Therapy

## 2021-11-29 ENCOUNTER — Encounter: Payer: Medicare HMO | Admitting: Occupational Therapy

## 2021-11-29 DIAGNOSIS — Z79899 Other long term (current) drug therapy: Secondary | ICD-10-CM | POA: Diagnosis not present

## 2021-11-29 DIAGNOSIS — D649 Anemia, unspecified: Secondary | ICD-10-CM | POA: Diagnosis not present

## 2021-11-29 DIAGNOSIS — Z720 Tobacco use: Secondary | ICD-10-CM | POA: Diagnosis not present

## 2021-11-29 DIAGNOSIS — M25512 Pain in left shoulder: Secondary | ICD-10-CM | POA: Diagnosis not present

## 2021-11-29 DIAGNOSIS — I714 Abdominal aortic aneurysm, without rupture, unspecified: Secondary | ICD-10-CM | POA: Diagnosis not present

## 2021-11-29 DIAGNOSIS — Z8673 Personal history of transient ischemic attack (TIA), and cerebral infarction without residual deficits: Secondary | ICD-10-CM | POA: Diagnosis not present

## 2021-12-03 NOTE — Therapy (Signed)
OUTPATIENT PHYSICAL THERAPY NEURO TREATMENT NOTE    Patient Name: Susan Farrell MRN: [id number] DOB:October 07, 1945, 76 y.o., female Today's Date: 12/04/2021   PCP: Barbette Reichmann, MD REFERRING PROVIDER: Charlton Amor, PA-C   PT End of Session - 12/04/21 0805     Visit Number 15    Number of Visits 24    Date for PT Re-Evaluation 12/11/21    Authorization Type [employer]    Authorization Time Period 09/18/21-12/11/21    Progress Note Due on Visit 20    PT Start Time 0803    PT Stop Time 0845    PT Time Calculation (min) 42 min    Equipment Utilized During Treatment Gait belt    Activity Tolerance Patient tolerated treatment well    Behavior During Therapy WFL for tasks assessed/performed                Past Medical History:  Diagnosis Date   Hypertension    Stroke [facility])    Past Surgical History:  Procedure Laterality Date   ABDOMINAL HYSTERECTOMY     CHOLECYSTECTOMY     ENDOVASCULAR REPAIR/STENT GRAFT N/A 06/12/2020   Procedure: ENDOVASCULAR REPAIR/STENT GRAFT;  Surgeon: Renford Dills, MD;  Location: [facility] INVASIVE CV LAB;  Service: Cardiovascular;  Laterality: N/A;   Patient Active Problem List   Diagnosis Date Noted   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) 09/02/2021   Hypertensive urgency 08/29/2021   Dyslipidemia 08/29/2021   Hypothyroidism 08/29/2021   CVA (cerebral vascular accident) (HCC) 08/28/2021   Ruptured abdominal aortic aneurysm (AAA) (HCC) 06/12/2020   Anemia 09/13/2019   Goiter 09/13/2019   Osteopenia 09/13/2019    ONSET DATE: 08/28/21  REFERRING DIAG: I63.81 (ICD-10-CM) - 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)  THERAPY DIAG:  Difficulty in walking, not elsewhere classified  Muscle weakness (generalized)  Abnormality of gait and mobility  Other lack of coordination  Other abnormalities of gait and mobility  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  (HCC)  Rationale for Evaluation and Treatment Rehabilitation  SUBJECTIVE:                                                                                                                                                                                             SUBJECTIVE STATEMENT: Pt reports she didn't do much over the weekend other than going to church.  Pt states that she has a stabilized bike now at home and has been using it consistently.     PERTINENT HISTORY:  Per Brief HPI from d/c 8/14: Susan Farrell is a 76 y.o. right-handed female  with history of discoid lupus, hypertension, hyperlipidemia, hypothyroidism, abdominal aortic aneurysm with repair tobacco use.  Per chart review lives with spouse independent prior to admission. Presented to [facility]/28/2021 with acute onset of left-sided weakness. 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  Underlying age-related cerebral atrophy with mild chronic small vessel changes.  CT angiogram head and neck showed internal carotid and vertebral arteries to be patent.  No intracranial large vessel occlusion or stenosis.   She did sustain a fall 08/30/2021 when attempting to get out of bed without assistance and no injury sustained.  Therapy evaluations completed due to patient decreased functional mobility was admitted for a comprehensive rehab program.  PAIN:  Are you having pain? No  PRECAUTIONS: Fall  WEIGHT BEARING RESTRICTIONS No  FALLS: Has patient fallen in last 6 months? Yes. Number of falls 1, slipped on towel in hospital and ended up in floor.   LIVING ENVIRONMENT: Lives with: lives with their family and lives with their partner Lives in: House/apartment Stairs: Yes: External: 1 steps; none Has following equipment at home: Dan Humphreys - 2 wheeled, Environmental consultant - 4 wheeled, bed side commode, and only uses bed side commode at night   PLOF: Independent, Independent with basic ADLs, Independent with household  mobility without device, Independent with community mobility without device, Independent with gait, and Leisure: going to church, going to grocery and department stores.   PATIENT GOALS Get back to normal walking and balance without any support.   OBJECTIVE:   FUNCTIONAL TESTs:  5 times sit to stand: 18.02 sec 22 6 minute walk test: 234 feet and requires seated rest break due to lower extremity fatigue on the left lower extremity  Berg Balance Scale: 36  PATIENT SURVEYS:  FOTO 65  TODAY'S TREATMENT:   There.Ex:   Supine Hamstring isometrics into table, 2x15 Supine hamstring curls with physioball, 2x15 Prone hamstring curls with physioball, 2x15 with eccentric lowering and AAROM from therapist in order to improve contractility    Neuro Re-Ed:  Korebalance with Neverball, Levels 1-6, significant amount of time spent playing for pt to achieve each goal   PATIENT EDUCATION: Education details: POC Person educated: Patient Education method: Explanation Education comprehension: verbalized understanding   HOME EXERCISE PROGRAM:  Access Code: [email or web address] URL: [email or web address] Date: 12/04/2021 Prepared by: Tomasa Hose  Exercises - Towel Scrunches  - 1 x daily - 7 x weekly - 3 sets - 10 reps - Ankle Inversion Eversion Towel Slide  - 1 x daily - 7 x weekly - 3 sets - 10 reps - Arch Lifting  - 1 x daily - 7 x weekly - 3 sets - 10 reps - Toe Spreading  - 1 x daily - 7 x weekly - 3 sets - 10 reps - Supine Hamstring Curl on Swiss Ball  - 1 x daily - 7 x weekly - 3 sets - 10 reps - Prone Hamstring Curl   - 1 x daily - 7 x weekly - 3 sets - 10 reps - Standing Alternating Knee Flexion  - 1 x daily - 7 x weekly - 3 sets - 10 reps - Prone Hamstring Curl   - 1 x daily - 7 x weekly - 3 sets - 10 reps   Access Code: [email or web address] URL: [email or web address] Date: 10/01/2021 Prepared by: Tomasa Hose  Exercises - Supine Active Straight Leg Raise  - 1 x  daily - 7 x weekly - 3 sets - 10 reps - Sidelying Hip Abduction  -  1 x daily - 7 x weekly - 3 sets - 10 reps - Supine Bridge  - 1 x daily - 7 x weekly - 3 sets - 10 reps - Clamshell  - 1 x daily - 7 x weekly - 3 sets - 10 reps - Standing hip extension  - 1 x daily - 7 x weekly - 3 sets - 10 reps    GOALS:  Goals reviewed with patient? Yes  SHORT TERM GOALS: Target date: 10/16/2021  Patient will be independent in home exercise program to improve strength/mobility for better functional independence with ADLs. Baseline: No HEP currently  Goal status: INITIAL   LONG TERM GOALS: Target date: 12/11/2021  1.  Patient (> 34 years old) will complete five times sit to stand test in < 15 seconds indicating an increased LE strength and improved balance. Baseline: 18 sec  Goal status: Achieved   2.  Patient will increase FOTO score to equal to or greater than  77   to demonstrate statistically significant improvement in mobility and quality of life.  Baseline: 65 Goal status: Progressing    3.  Patient will increase Berg Balance score by > 6 points to demonstrate decreased fall risk during functional activities. Baseline: 36; 10/24:  BERG -  46/56 Goal status: Achieved    4.  Patient will increase 10 meter walk test to >1.74m/s as to improve gait speed for better community ambulation and to reduce fall risk. Baseline: .68m/s; 10/24 - 0.74 m/s Goal status: Progressing   5.  Patient will increase six minute walk test distance to >1000 for progression to community ambulator and improve gait ability Baseline: 234 ft; 740 ft AFO/SBQC Goal status: Progressing    ASSESSMENT:  Pt performed well with hamstring activation and was given updated HEP to include hamstring exercises in order for the pt to perform at home.  Pt able to put forth good effort throughout the session and made good progress with hamstring activation.  Pt to continue to benefit from posterior chain of exercises in order  to improve ambulation and return to PLOF.   OBJECTIVE IMPAIRMENTS Abnormal gait, decreased activity tolerance, decreased balance, decreased coordination, decreased endurance, decreased mobility, difficulty walking, and decreased strength.   ACTIVITY LIMITATIONS lifting, bending, standing, squatting, stairs, dressing, and caring for others  PARTICIPATION LIMITATIONS: meal prep, cleaning, driving, shopping, yard work, and church  PERSONAL FACTORS Age are also affecting patient's functional outcome.   REHAB POTENTIAL: Good  CLINICAL DECISION MAKING: Evolving/moderate complexity  EVALUATION COMPLEXITY: Low  PLAN: PT FREQUENCY: 2x/week  PT DURATION: 12 weeks  PLANNED INTERVENTIONS: Therapeutic exercises, Therapeutic activity, Neuromuscular re-education, Balance training, Gait training, Patient/Family education, Self Care, Joint mobilization, Dry Needling, and Manual therapy  PLAN FOR NEXT SESSION: hamstring activation exercises. progress balance exercises and continue working on ambulation and exercises without AFO.  Assess new hurrycane if patient brings in.    Nolon Bussing, PT, DPT Physical Therapist- [facility]  12/04/21, 9:43 AM

## 2021-12-04 ENCOUNTER — Ambulatory Visit: Payer: Medicare HMO

## 2021-12-04 DIAGNOSIS — I6381 Other cerebral infarction due to occlusion or stenosis of small artery: Secondary | ICD-10-CM | POA: Diagnosis not present

## 2021-12-04 DIAGNOSIS — R278 Other lack of coordination: Secondary | ICD-10-CM | POA: Diagnosis not present

## 2021-12-04 DIAGNOSIS — R2689 Other abnormalities of gait and mobility: Secondary | ICD-10-CM | POA: Diagnosis not present

## 2021-12-04 DIAGNOSIS — R269 Unspecified abnormalities of gait and mobility: Secondary | ICD-10-CM

## 2021-12-04 DIAGNOSIS — M6281 Muscle weakness (generalized): Secondary | ICD-10-CM | POA: Diagnosis not present

## 2021-12-04 DIAGNOSIS — R262 Difficulty in walking, not elsewhere classified: Secondary | ICD-10-CM | POA: Diagnosis not present

## 2021-12-05 ENCOUNTER — Ambulatory Visit: Payer: Medicare HMO | Admitting: Physical Therapy

## 2021-12-06 ENCOUNTER — Ambulatory Visit: Payer: Medicare HMO | Admitting: Physical Therapy

## 2021-12-06 DIAGNOSIS — Z8679 Personal history of other diseases of the circulatory system: Secondary | ICD-10-CM | POA: Diagnosis not present

## 2021-12-06 DIAGNOSIS — F17210 Nicotine dependence, cigarettes, uncomplicated: Secondary | ICD-10-CM | POA: Diagnosis not present

## 2021-12-06 DIAGNOSIS — I10 Essential (primary) hypertension: Secondary | ICD-10-CM | POA: Diagnosis not present

## 2021-12-06 DIAGNOSIS — Z95828 Presence of other vascular implants and grafts: Secondary | ICD-10-CM | POA: Diagnosis not present

## 2021-12-06 DIAGNOSIS — I69344 Monoplegia of lower limb following cerebral infarction affecting left non-dominant side: Secondary | ICD-10-CM | POA: Diagnosis not present

## 2021-12-06 DIAGNOSIS — M8580 Other specified disorders of bone density and structure, unspecified site: Secondary | ICD-10-CM | POA: Diagnosis not present

## 2021-12-06 DIAGNOSIS — D649 Anemia, unspecified: Secondary | ICD-10-CM | POA: Diagnosis not present

## 2021-12-09 ENCOUNTER — Ambulatory Visit: Payer: Medicare HMO

## 2021-12-09 ENCOUNTER — Encounter: Payer: Medicare HMO | Admitting: Occupational Therapy

## 2021-12-09 DIAGNOSIS — R269 Unspecified abnormalities of gait and mobility: Secondary | ICD-10-CM | POA: Diagnosis not present

## 2021-12-09 DIAGNOSIS — M6281 Muscle weakness (generalized): Secondary | ICD-10-CM

## 2021-12-09 DIAGNOSIS — R278 Other lack of coordination: Secondary | ICD-10-CM | POA: Diagnosis not present

## 2021-12-09 DIAGNOSIS — R262 Difficulty in walking, not elsewhere classified: Secondary | ICD-10-CM

## 2021-12-09 DIAGNOSIS — R2689 Other abnormalities of gait and mobility: Secondary | ICD-10-CM

## 2021-12-09 DIAGNOSIS — I6381 Other cerebral infarction due to occlusion or stenosis of small artery: Secondary | ICD-10-CM | POA: Diagnosis not present

## 2021-12-09 NOTE — Therapy (Addendum)
OUTPATIENT PHYSICAL THERAPY NEURO TREATMENT NOTE    Patient Name: Susan Farrell MRN: [id number] DOB:09/06/1945, 76 y.o., female Today's Date: 12/09/2021   PCP: Tracie Harrier, MD REFERRING PROVIDER: Cathlyn Parsons, PA-C   PT End of Session - 12/09/21 0757     Visit Number 16    Number of Visits 24    Date for PT Re-Evaluation 12/11/21    Authorization Type [employer]    Authorization Time Period 09/18/21-12/11/21    Progress Note Due on Visit 20    PT Start Time 0800    PT Stop Time 0845    PT Time Calculation (min) 45 min    Equipment Utilized During Treatment Gait belt    Activity Tolerance Patient tolerated treatment well    Behavior During Therapy WFL for tasks assessed/performed                Past Medical History:  Diagnosis Date   Hypertension    Stroke [facility])    Past Surgical History:  Procedure Laterality Date   ABDOMINAL HYSTERECTOMY     CHOLECYSTECTOMY     ENDOVASCULAR REPAIR/STENT GRAFT N/A 06/12/2020   Procedure: ENDOVASCULAR REPAIR/STENT GRAFT;  Surgeon: Katha Cabal, MD;  Location: [place] CV LAB;  Service: Cardiovascular;  Laterality: N/A;   Patient Active Problem List   Diagnosis Date Noted   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 ([place]) 09/02/2021   Hypertensive urgency 08/29/2021   Dyslipidemia 08/29/2021   Hypothyroidism 08/29/2021   CVA (cerebral vascular accident) ([place]) 08/28/2021   Ruptured abdominal aortic aneurysm (AAA) ([place]) 06/12/2020   Anemia 09/13/2019   Goiter 09/13/2019   Osteopenia 09/13/2019    ONSET DATE: 08/28/21  REFERRING DIAG: I63.81 (ICD-10-CM) - 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 ([place])  THERAPY DIAG:  Difficulty in walking, not elsewhere classified  Muscle weakness (generalized)  Abnormality of gait and mobility  Other lack of coordination  Other abnormalities of gait and mobility  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  (HCC)  Rationale for Evaluation and Treatment Rehabilitation  SUBJECTIVE:                                                                                                                                                                                             SUBJECTIVE STATEMENT:  Pt reports no new complaints and is doing well.  She reports she has tried to perform her hamstring exercises at home and things are going well.  She reports she is expecting to go to the grocery store today to prep for Thanksgiving.   PERTINENT HISTORY:  Per Brief HPI from  d/c 8/14: KIKUE GERHART is a 76 y.o. right-handed female with history of discoid lupus, hypertension, hyperlipidemia, hypothyroidism, abdominal aortic aneurysm with repair tobacco use.  Per chart review lives with spouse independent prior to admission. Presented to [facility] 08/28/2021 with acute onset of left-sided weakness. 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  Underlying age-related cerebral atrophy with mild chronic small vessel changes.  CT angiogram head and neck showed internal carotid and vertebral arteries to be patent.  No intracranial large vessel occlusion or stenosis.   She did sustain a fall 08/30/2021 when attempting to get out of bed without assistance and no injury sustained.  Therapy evaluations completed due to patient decreased functional mobility was admitted for a comprehensive rehab program.  PAIN:  Are you having pain? No  PRECAUTIONS: Fall  WEIGHT BEARING RESTRICTIONS No  FALLS: Has patient fallen in last 6 months? Yes. Number of falls 1, slipped on towel in hospital and ended up in floor.   LIVING ENVIRONMENT: Lives with: lives with their family and lives with their partner Lives in: House/apartment Stairs: Yes: External: 1 steps; none Has following equipment at home: Dan Humphreys - 2 wheeled, Environmental consultant - 4 wheeled, bed side commode, and only uses bed side commode at night   PLOF:  Independent, Independent with basic ADLs, Independent with household mobility without device, Independent with community mobility without device, Independent with gait, and Leisure: going to church, going to grocery and department stores.   PATIENT GOALS Get back to normal walking and balance without any support.   OBJECTIVE:   FUNCTIONAL TESTs:  5 times sit to stand: 18.02 sec 22 6 minute walk test: 234 feet and requires seated rest break due to lower extremity fatigue on the left lower extremity  Berg Balance Scale: 36  PATIENT SURVEYS:  FOTO 65  TODAY'S TREATMENT:   There.Ex:   Supine Hamstring isometrics into theraband disc, 5 sec holds, 2x15 Supine hamstring curls with physioball, 2x15 Prone hamstring curls with belt, 2x15 with eccentric lowering and AAROM from therapist in order to improve contractility of the musculature Seated hamstring scoots on rolling chair with back support, 1 lap around the gym equipment before becoming fatigued. Ambulation around the gym without AFO, x3 laps with some fatigued noted around the second lap for which pt took a standing rest break  Pt fatigued at end of ambulation, so therapy session was cut a few minutes shorter than normal.  PATIENT EDUCATION: Education details: POC Person educated: Patient Education method: Explanation Education comprehension: verbalized understanding   HOME EXERCISE PROGRAM:  Access Code: [email or web address] URL: [email or web address] Date: 12/04/2021 Prepared by: Tomasa Hose  Exercises - Towel Scrunches  - 1 x daily - 7 x weekly - 3 sets - 10 reps - Ankle Inversion Eversion Towel Slide  - 1 x daily - 7 x weekly - 3 sets - 10 reps - Arch Lifting  - 1 x daily - 7 x weekly - 3 sets - 10 reps - Toe Spreading  - 1 x daily - 7 x weekly - 3 sets - 10 reps - Supine Hamstring Curl on Swiss Ball  - 1 x daily - 7 x weekly - 3 sets - 10 reps - Prone Hamstring Curl   - 1 x daily - 7 x weekly - 3 sets - 10 reps -  Standing Alternating Knee Flexion  - 1 x daily - 7 x weekly - 3 sets - 10 reps - Prone Hamstring Curl   -  1 x daily - 7 x weekly - 3 sets - 10 reps   Access Code: [email or web address] URL: [email or web address] Date: 10/01/2021 Prepared by: [employer]  Exercises - Supine Active Straight Leg Raise  - 1 x daily - 7 x weekly - 3 sets - 10 reps - Sidelying Hip Abduction  - 1 x daily - 7 x weekly - 3 sets - 10 reps - Supine Bridge  - 1 x daily - 7 x weekly - 3 sets - 10 reps - Clamshell  - 1 x daily - 7 x weekly - 3 sets - 10 reps - Standing hip extension  - 1 x daily - 7 x weekly - 3 sets - 10 reps    GOALS:  Goals reviewed with patient? Yes  SHORT TERM GOALS: Target date: 10/16/2021  Patient will be independent in home exercise program to improve strength/mobility for better functional independence with ADLs. Baseline: No HEP currently  Goal status: INITIAL   LONG TERM GOALS: Target date: 12/11/2021  1.  Patient (> 68 years old) will complete five times sit to stand test in < 15 seconds indicating an increased LE strength and improved balance. Baseline: 18 sec  Goal status: Achieved   2.  Patient will increase FOTO score to equal to or greater than  77   to demonstrate statistically significant improvement in mobility and quality of life.  Baseline: 65 Goal status: Progressing    3.  Patient will increase Berg Balance score by > 6 points to demonstrate decreased fall risk during functional activities. Baseline: 36; 10/24:  BERG -  46/56 Goal status: Achieved    4.  Patient will increase 10 meter walk test to >1.51m/s as to improve gait speed for better community ambulation and to reduce fall risk. Baseline: .55m/s; 10/24 - 0.74 m/s Goal status: Progressing   5.  Patient will increase six minute walk test distance to >1000 for progression to community ambulator and improve gait ability Baseline: 234 ft; 740 ft AFO/SBQC Goal status:  Progressing    ASSESSMENT:  Pt continues to have difficulty with hamstring strength, but has made consistent progress during treatment sessions and it is evident that pt is performing exercises as part of her HEP.  Pt discussed tomorrow, 12/10/21 being the last treatment session as pt is ready to continue with her HEP and has made improvements that she is happy with.   Pt will continue to benefit from skilled therapy to address remaining deficits in order to improve overall QoL and return to PLOF.      OBJECTIVE IMPAIRMENTS Abnormal gait, decreased activity tolerance, decreased balance, decreased coordination, decreased endurance, decreased mobility, difficulty walking, and decreased strength.   ACTIVITY LIMITATIONS lifting, bending, standing, squatting, stairs, dressing, and caring for others  PARTICIPATION LIMITATIONS: meal prep, cleaning, driving, shopping, yard work, and church  PERSONAL FACTORS Age are also affecting patient's functional outcome.   REHAB POTENTIAL: Good  CLINICAL DECISION MAKING: Evolving/moderate complexity  EVALUATION COMPLEXITY: Low  PLAN: PT FREQUENCY: 2x/week  PT DURATION: 12 weeks  PLANNED INTERVENTIONS: Therapeutic exercises, Therapeutic activity, Neuromuscular re-education, Balance training, Gait training, Patient/Family education, Self Care, Joint mobilization, Dry Needling, and Manual therapy  PLAN FOR NEXT SESSION: d/c at the next session.    Gwenlyn Saran, PT, DPT Physical Therapist- [facility]  12/09/21, 10:01 AM

## 2021-12-10 ENCOUNTER — Ambulatory Visit: Payer: Medicare HMO

## 2021-12-10 ENCOUNTER — Encounter: Payer: Medicare HMO | Admitting: Occupational Therapy

## 2021-12-10 DIAGNOSIS — R278 Other lack of coordination: Secondary | ICD-10-CM

## 2021-12-10 DIAGNOSIS — R269 Unspecified abnormalities of gait and mobility: Secondary | ICD-10-CM | POA: Diagnosis not present

## 2021-12-10 DIAGNOSIS — R2689 Other abnormalities of gait and mobility: Secondary | ICD-10-CM | POA: Diagnosis not present

## 2021-12-10 DIAGNOSIS — M6281 Muscle weakness (generalized): Secondary | ICD-10-CM | POA: Diagnosis not present

## 2021-12-10 DIAGNOSIS — I6381 Other cerebral infarction due to occlusion or stenosis of small artery: Secondary | ICD-10-CM | POA: Diagnosis not present

## 2021-12-10 DIAGNOSIS — R262 Difficulty in walking, not elsewhere classified: Secondary | ICD-10-CM

## 2021-12-10 NOTE — Therapy (Addendum)
OUTPATIENT PHYSICAL THERAPY NEURO TREATMENT NOTE/DISCHARGE    Patient Name: Susan Farrell MRN: [id number] DOB:01/22/1945, 76 y.o., female Today's Date: 12/10/2021   PCP: Barbette Reichmann, MD REFERRING PROVIDER: Charlton Amor, PA-C   PT End of Session - 12/10/21 1018     Visit Number 17    Number of Visits 24    Date for PT Re-Evaluation 12/11/21    Authorization Type [employer]    Authorization Time Period 09/18/21-12/11/21    Progress Note Due on Visit 20    PT Start Time 1018    PT Stop Time 1100    PT Time Calculation (min) 42 min    Equipment Utilized During Treatment Gait belt    Activity Tolerance Patient tolerated treatment well    Behavior During Therapy WFL for tasks assessed/performed                 Past Medical History:  Diagnosis Date   Hypertension    Stroke [facility])    Past Surgical History:  Procedure Laterality Date   ABDOMINAL HYSTERECTOMY     CHOLECYSTECTOMY     ENDOVASCULAR REPAIR/STENT GRAFT N/A 06/12/2020   Procedure: ENDOVASCULAR REPAIR/STENT GRAFT;  Surgeon: Renford Dills, MD;  Location: [facility] INVASIVE [facility];  Service: Cardiovascular;  Laterality: N/A;   Patient Active Problem List   Diagnosis Date Noted   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) 09/02/2021   Hypertensive urgency 08/29/2021   Dyslipidemia 08/29/2021   Hypothyroidism 08/29/2021   CVA (cerebral vascular accident) (HCC) 08/28/2021   Ruptured abdominal aortic aneurysm (AAA) (HCC) 06/12/2020   Anemia 09/13/2019   Goiter 09/13/2019   Osteopenia 09/13/2019    ONSET DATE: 08/28/21  REFERRING DIAG: I63.81 (ICD-10-CM) - 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)  THERAPY DIAG:  Difficulty in walking, not elsewhere classified  Muscle weakness (generalized)  Abnormality of gait and mobility  Other lack of coordination  Other abnormalities of gait and mobility  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  (HCC)  Rationale for Evaluation and Treatment Rehabilitation  SUBJECTIVE:                                                                                                                                                                                             SUBJECTIVE STATEMENT:  Pt reports she is feeling wonderful and is ready for discharge.  Pt is happy with the progress that she's made and is looking forward to continue with her HEP and get better going forward.    PERTINENT HISTORY:  Per Brief HPI from d/c 8/14: FREDRICA CAPANO is a  76 y.o. right-handed female with history of discoid lupus, hypertension, hyperlipidemia, hypothyroidism, abdominal aortic aneurysm with repair tobacco use.  Per chart review lives with spouse independent prior to admission. [facility]ed to [facility]/28/2021 with acute onset of left-sided weakness. 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  Underlying age-related cerebral atrophy with mild chronic small vessel changes.  CT angiogram head and neck showed internal carotid and vertebral arteries to be patent.  No intracranial large vessel occlusion or stenosis.   She did sustain a fall 08/30/2021 when attempting to get out of bed without assistance and no injury sustained.  Therapy evaluations completed due to patient decreased functional mobility was admitted for a comprehensive rehab program.  PAIN:  Are you having pain? No  PRECAUTIONS: Fall  WEIGHT BEARING RESTRICTIONS No  FALLS: Has patient fallen in last 6 months? Yes. Number of falls 1, slipped on towel in hospital and ended up in floor.   LIVING ENVIRONMENT: Lives with: lives with their family and lives with their partner Lives in: House/apartment Stairs: Yes: External: 1 steps; none Has following equipment at home: Dan Humphreys - 2 wheeled, Environmental consultant - 4 wheeled, bed side commode, and only uses bed side commode at night   PLOF: Independent, Independent with basic ADLs,  Independent with household mobility without device, Independent with community mobility without device, Independent with gait, and Leisure: going to church, going to grocery and department stores.   PATIENT GOALS Get back to normal walking and balance without any support.   OBJECTIVE:   FUNCTIONAL TESTs:  5 times sit to stand: 18.02 sec 22 6 minute walk test: 234 feet and requires seated rest break due to lower extremity fatigue on the left lower extremity  Berg Balance Scale: 36  PATIENT SURVEYS:  FOTO 65  TODAY'S TREATMENT:   There.Ex:   Goal assessment and discharge performed as noted below.    PATIENT EDUCATION: Education details: POC Person educated: Patient Education method: Explanation Education comprehension: verbalized understanding   HOME EXERCISE PROGRAM:  Access Code: [email or web address] URL: [email or web address] Date: 12/10/2021 Prepared by: Tomasa Hose  Exercises - Towel Scrunches  - 1 x daily - 7 x weekly - 3 sets - 10 reps - Ankle Inversion Eversion Towel Slide  - 1 x daily - 7 x weekly - 3 sets - 10 reps - Arch Lifting  - 1 x daily - 7 x weekly - 3 sets - 10 reps - Toe Spreading  - 1 x daily - 7 x weekly - 3 sets - 10 reps - Supine Hamstring Curl on Swiss Ball  - 1 x daily - 7 x weekly - 3 sets - 10 reps - Standing Alternating Knee Flexion  - 1 x daily - 7 x weekly - 3 sets - 10 reps - Prone Hamstring Curl   - 1 x daily - 7 x weekly - 3 sets - 10 reps - Hooklying Hamstring Stretch with Strap  - 2 x daily - 7 x weekly - 1 sets - 2 reps - 30 hold - Seated Hamstring Stretch  - 2 x daily - 7 x weekly - 1 sets - 2 reps - 30 hold - Supine Bridge  - 1 x daily - 7 x weekly - 3 sets - 10 reps   Access Code: [email or web address] URL: [email or web address] Date: 10/01/2021 Prepared by: Tomasa Hose  Exercises - Supine Active Straight Leg Raise  - 1 x daily - 7 x weekly - 3 sets - 10 reps -  Sidelying Hip Abduction  - 1 x daily - 7 x weekly - 3 sets  - 10 reps - Supine Bridge  - 1 x daily - 7 x weekly - 3 sets - 10 reps - Clamshell  - 1 x daily - 7 x weekly - 3 sets - 10 reps - Standing hip extension  - 1 x daily - 7 x weekly - 3 sets - 10 reps    GOALS:  Goals reviewed with patient? Yes  SHORT TERM GOALS: Target date: 10/16/2021  Patient will be independent in home exercise program to improve strength/mobility for better functional independence with ADLs. Baseline: No HEP currently  Goal status: INITIAL   LONG TERM GOALS: Target date: 12/11/2021  1.  Patient (> 69 years old) will complete five times sit to stand test in < 15 seconds indicating an increased LE strength and improved balance. Baseline: 18 sec  Goal status: Achieved   2.  Patient will increase FOTO score to equal to or greater than  77   to demonstrate statistically significant improvement in mobility and quality of life.  Baseline: 65; 11/21: 72 Goal status: Progressing    3.  Patient will increase Berg Balance score by > 6 points to demonstrate decreased fall risk during functional activities. Baseline: 36; 10/24:  BERG -  46/56 Goal status: Achieved    4.  Patient will increase 10 meter walk test to >1.56m/s as to improve gait speed for better community ambulation and to reduce fall risk. Baseline: .22m/s; 10/24 - 0.74 m/s; 11/21 0.98 m/s Goal status: Progressing   5.  Patient will increase six minute walk test distance to >1000 for progression to community ambulator and improve gait ability Baseline: 234 ft; 740 ft AFO/SBQC; 11/21: 856ft Goal status: Progressing    ASSESSMENT:  Pt has made considerable improvements over the course of therapy.  Pt notes that she is ready to continue her HEP at home in order to progress her balance and duration of ambulation.  Pt unable to achieve all goals at this point in therapy, however has made significant progress towards them.  Pt and therapist both in agreement that she is doing well enough to discharge at  this time.      OBJECTIVE IMPAIRMENTS Abnormal gait, decreased activity tolerance, decreased balance, decreased coordination, decreased endurance, decreased mobility, difficulty walking, and decreased strength.   ACTIVITY LIMITATIONS lifting, bending, standing, squatting, stairs, dressing, and caring for others  PARTICIPATION LIMITATIONS: meal prep, cleaning, driving, shopping, yard work, and church  PERSONAL FACTORS Age are also affecting patient's functional outcome.   REHAB POTENTIAL: Good  CLINICAL DECISION MAKING: Evolving/moderate complexity  EVALUATION COMPLEXITY: Low  PLAN: PT FREQUENCY: 2x/week  PT DURATION: 12 weeks  PLANNED INTERVENTIONS: Therapeutic exercises, Therapeutic activity, Neuromuscular re-education, Balance training, Gait training, Patient/Family education, Self Care, Joint mobilization, Dry Needling, and Manual therapy  PLAN FOR NEXT SESSION: d/c at this time.    Nolon Bussing, PT, DPT Physical Therapist- [facility]  12/10/21, 1:12 PM

## 2021-12-16 ENCOUNTER — Encounter: Payer: Medicare HMO | Admitting: Occupational Therapy

## 2021-12-16 ENCOUNTER — Ambulatory Visit: Payer: Medicare HMO

## 2021-12-16 DIAGNOSIS — H25813 Combined forms of age-related cataract, bilateral: Secondary | ICD-10-CM | POA: Diagnosis not present

## 2021-12-16 DIAGNOSIS — H524 Presbyopia: Secondary | ICD-10-CM | POA: Diagnosis not present

## 2021-12-18 ENCOUNTER — Encounter: Payer: Medicare HMO | Admitting: Occupational Therapy

## 2021-12-18 ENCOUNTER — Ambulatory Visit: Payer: Medicare HMO

## 2021-12-23 ENCOUNTER — Ambulatory Visit: Payer: Medicare HMO

## 2021-12-23 ENCOUNTER — Encounter: Payer: Medicare HMO | Admitting: Occupational Therapy

## 2021-12-25 ENCOUNTER — Encounter: Payer: Medicare HMO | Admitting: Occupational Therapy

## 2021-12-25 ENCOUNTER — Ambulatory Visit: Payer: Medicare HMO

## 2021-12-30 ENCOUNTER — Ambulatory Visit: Payer: Medicare HMO

## 2021-12-30 ENCOUNTER — Encounter: Payer: Medicare HMO | Admitting: Occupational Therapy

## 2022-01-01 ENCOUNTER — Ambulatory Visit: Payer: Medicare HMO

## 2022-01-01 ENCOUNTER — Encounter: Payer: Medicare HMO | Admitting: Occupational Therapy

## 2022-01-06 ENCOUNTER — Encounter: Payer: Medicare HMO | Admitting: Occupational Therapy

## 2022-01-06 ENCOUNTER — Ambulatory Visit: Payer: Medicare HMO

## 2022-01-07 ENCOUNTER — Other Ambulatory Visit
Admission: RE | Admit: 2022-01-07 | Discharge: 2022-01-07 | Disposition: A | Discharge status: Home / Self Care | Payer: Medicare HMO | Attending: Otolaryngology | Admitting: Otolaryngology

## 2022-01-07 DIAGNOSIS — E041 Nontoxic single thyroid nodule: Secondary | ICD-10-CM | POA: Insufficient documentation

## 2022-01-07 LAB — T4, FREE: Free T4: 0.92 ng/dL (ref 0.61–1.12)

## 2022-01-07 LAB — TSH: TSH: 1.61 u[IU]/mL (ref 0.350–4.500)

## 2022-01-08 ENCOUNTER — Ambulatory Visit: Payer: Medicare HMO

## 2022-01-08 ENCOUNTER — Encounter: Payer: Medicare HMO | Admitting: Occupational Therapy

## 2022-01-08 LAB — T3, FREE: T3, Free: 2.7 pg/mL (ref 2.0–4.4)

## 2022-01-14 DIAGNOSIS — E041 Nontoxic single thyroid nodule: Secondary | ICD-10-CM | POA: Diagnosis not present

## 2022-01-15 ENCOUNTER — Encounter: Payer: Medicare HMO | Admitting: Occupational Therapy

## 2022-01-15 ENCOUNTER — Ambulatory Visit: Payer: Medicare HMO

## 2022-01-22 ENCOUNTER — Encounter: Payer: Medicare HMO | Admitting: Occupational Therapy

## 2022-01-22 ENCOUNTER — Ambulatory Visit: Payer: Medicare HMO

## 2022-01-22 DIAGNOSIS — L930 Discoid lupus erythematosus: Secondary | ICD-10-CM | POA: Diagnosis not present

## 2022-03-03 IMAGING — MG MM DIGITAL SCREENING BILAT W/ TOMO AND CAD
8 series · 8 of 24 positions shown · non-contrast
Comparison: Previous exam(s).

CLINICAL DATA: Screening.

EXAM:
DIGITAL SCREENING BILATERAL MAMMOGRAM WITH TOMOSYNTHESIS AND CAD
TECHNIQUE: Bilateral screening digital craniocaudal and mediolateral oblique
mammograms were obtained. Bilateral screening digital breast
tomosynthesis was performed. The images were evaluated with
computer-aided detection.

[R MLO synth-2D]
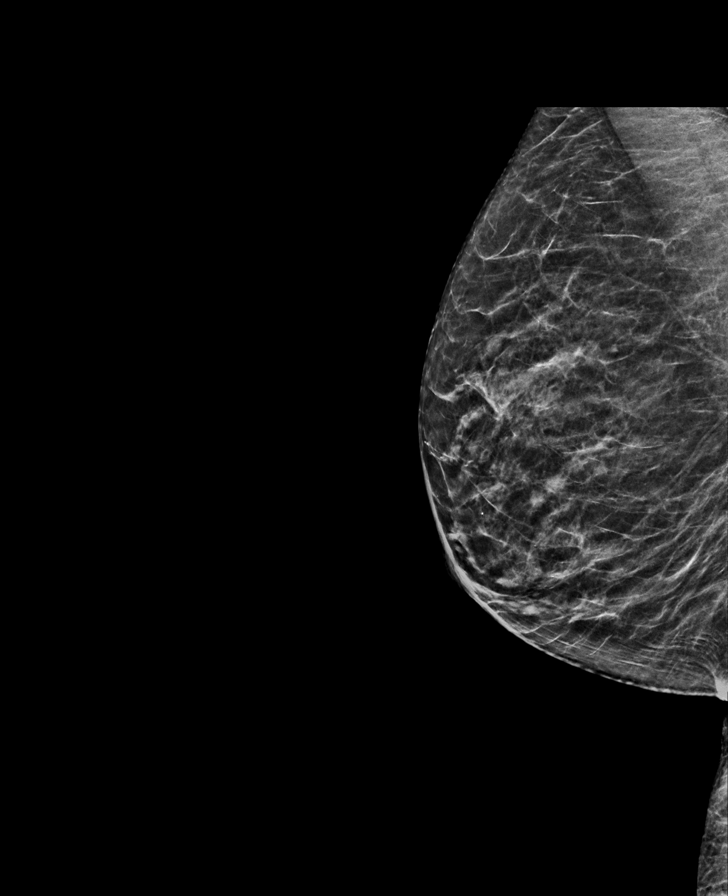

[L MLO synth-2D]
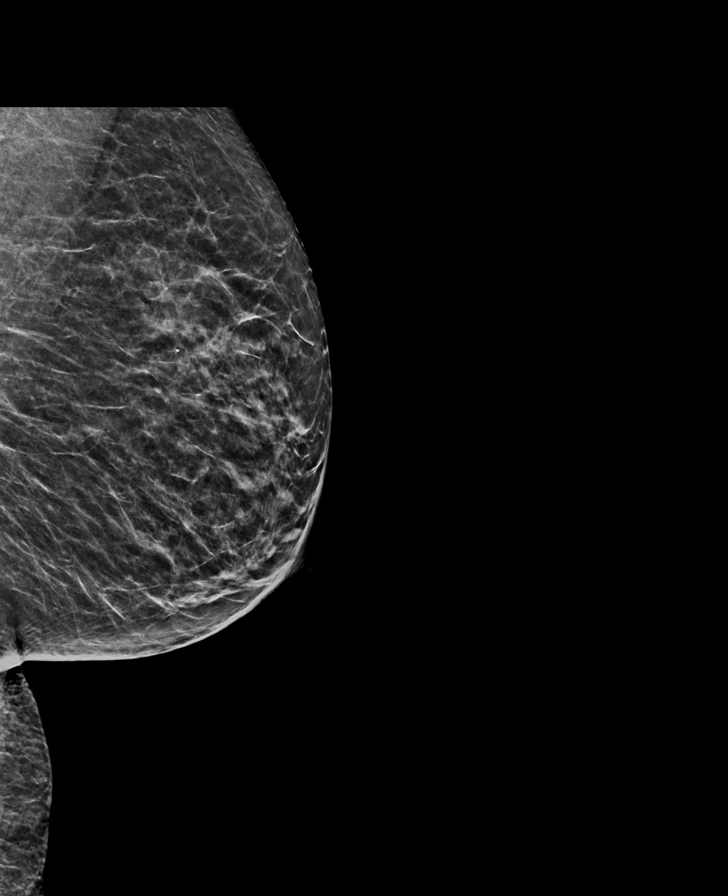

[R CC synth-2D]
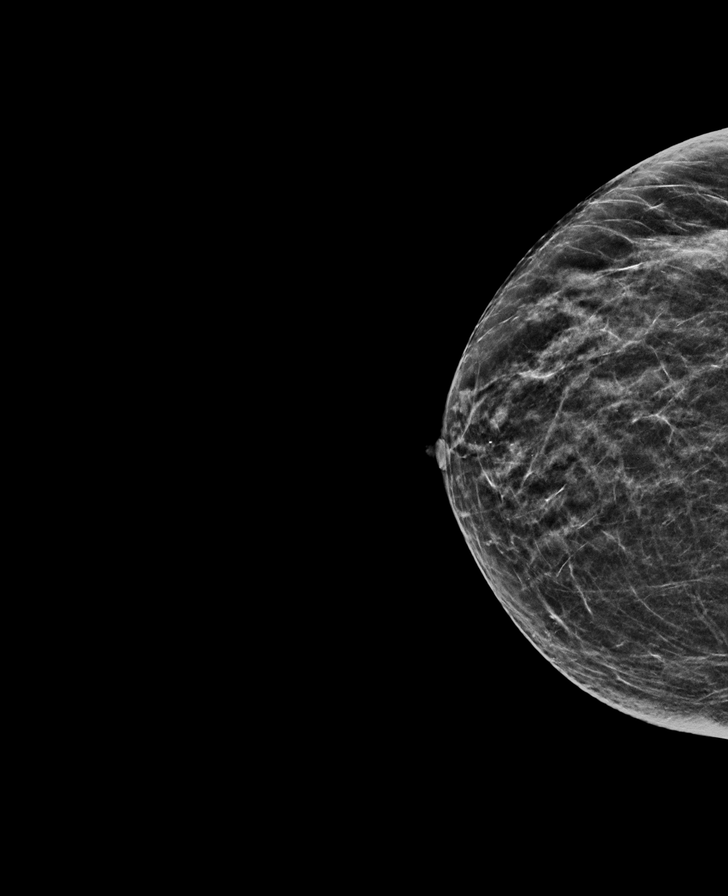

[L CC synth-2D]
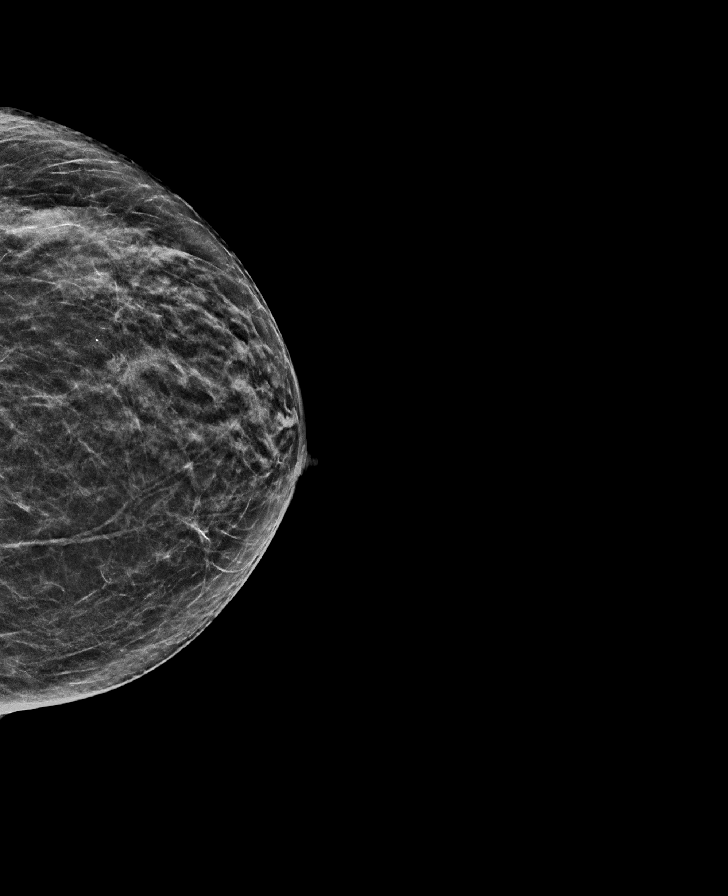

[R CC tomo · tomo slice 25/49.0]
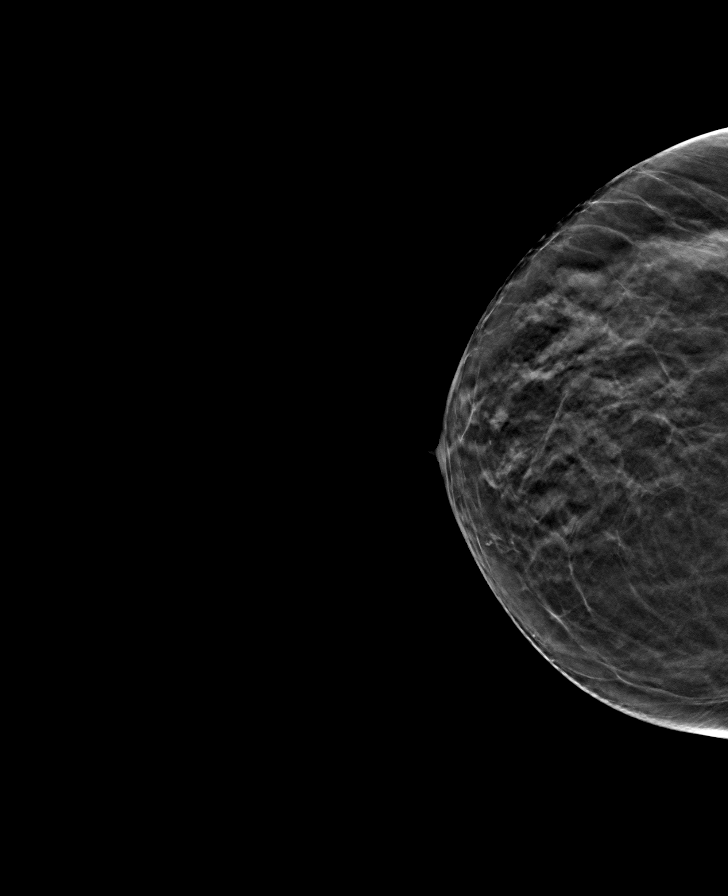

[L CC tomo · tomo slice 25/50.0]
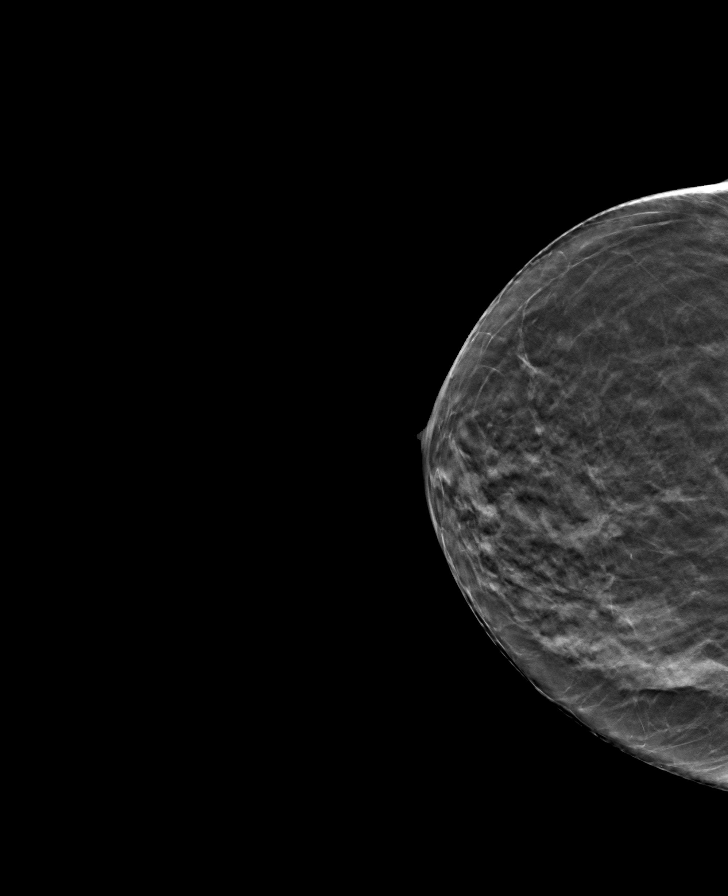

[L MLO tomo · tomo slice 26/51.0]
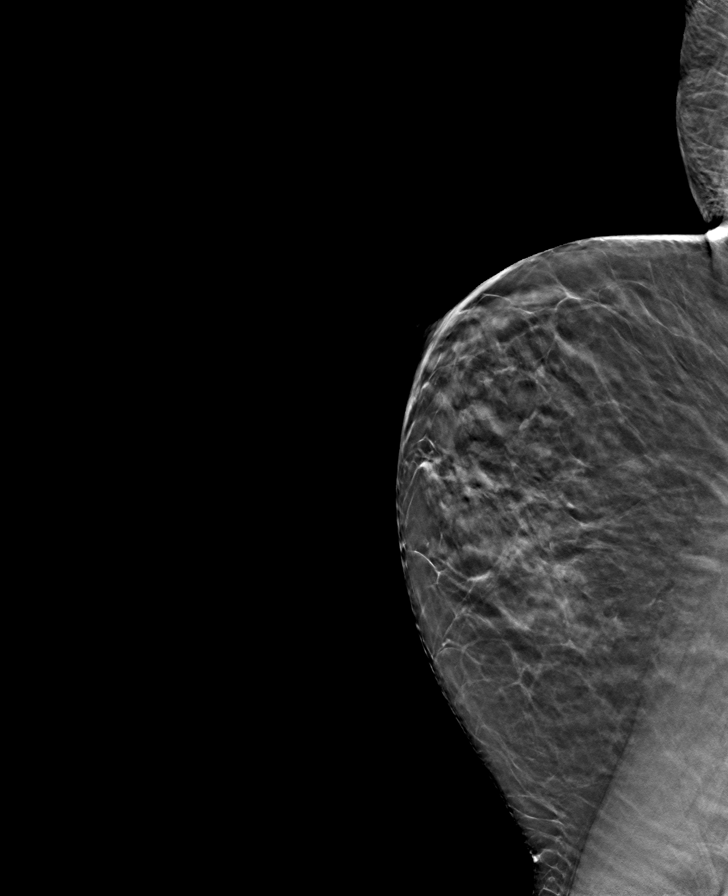

[R MLO tomo · tomo slice 27/52.0]
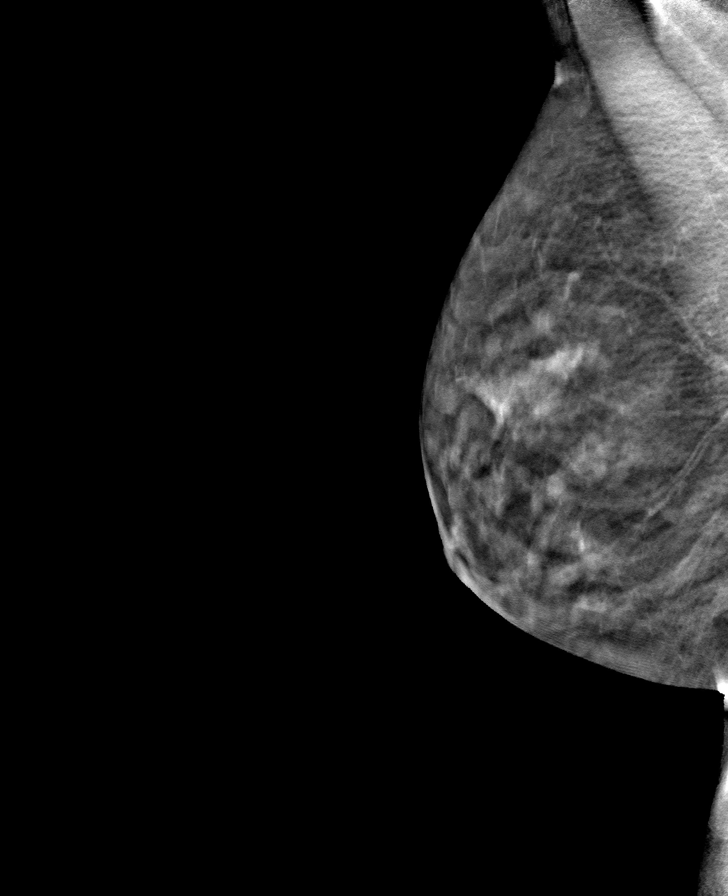

[8 of 24 positions shown; findings below may reference images not displayed]

ACR Breast Density Category b: There are scattered areas of
fibroglandular density.
FINDINGS: There are no findings suspicious for malignancy.
IMPRESSION: No mammographic evidence of malignancy. A result letter of this
screening mammogram will be mailed directly to the patient.

RECOMMENDATION:
Screening mammogram in one year. (Code:51-O-LD2)

BI-RADS CATEGORY  1: Negative.

## 2022-03-14 DIAGNOSIS — Z8673 Personal history of transient ischemic attack (TIA), and cerebral infarction without residual deficits: Secondary | ICD-10-CM | POA: Diagnosis not present

## 2022-03-14 DIAGNOSIS — E041 Nontoxic single thyroid nodule: Secondary | ICD-10-CM | POA: Diagnosis not present

## 2022-03-14 DIAGNOSIS — M47812 Spondylosis without myelopathy or radiculopathy, cervical region: Secondary | ICD-10-CM | POA: Diagnosis not present

## 2022-03-14 DIAGNOSIS — L930 Discoid lupus erythematosus: Secondary | ICD-10-CM | POA: Diagnosis not present

## 2022-04-07 DIAGNOSIS — Z720 Tobacco use: Secondary | ICD-10-CM | POA: Diagnosis not present

## 2022-04-07 DIAGNOSIS — D649 Anemia, unspecified: Secondary | ICD-10-CM | POA: Diagnosis not present

## 2022-04-07 DIAGNOSIS — M8580 Other specified disorders of bone density and structure, unspecified site: Secondary | ICD-10-CM | POA: Diagnosis not present

## 2022-04-07 DIAGNOSIS — I10 Essential (primary) hypertension: Secondary | ICD-10-CM | POA: Diagnosis not present

## 2022-04-07 DIAGNOSIS — R7309 Other abnormal glucose: Secondary | ICD-10-CM | POA: Diagnosis not present

## 2022-04-07 DIAGNOSIS — Z95828 Presence of other vascular implants and grafts: Secondary | ICD-10-CM | POA: Diagnosis not present

## 2022-04-07 DIAGNOSIS — Z8673 Personal history of transient ischemic attack (TIA), and cerebral infarction without residual deficits: Secondary | ICD-10-CM | POA: Diagnosis not present

## 2022-04-11 DIAGNOSIS — D649 Anemia, unspecified: Secondary | ICD-10-CM | POA: Diagnosis not present

## 2022-04-11 DIAGNOSIS — Z8673 Personal history of transient ischemic attack (TIA), and cerebral infarction without residual deficits: Secondary | ICD-10-CM | POA: Diagnosis not present

## 2022-04-11 DIAGNOSIS — M8580 Other specified disorders of bone density and structure, unspecified site: Secondary | ICD-10-CM | POA: Diagnosis not present

## 2022-04-11 DIAGNOSIS — R7309 Other abnormal glucose: Secondary | ICD-10-CM | POA: Diagnosis not present

## 2022-04-11 DIAGNOSIS — I10 Essential (primary) hypertension: Secondary | ICD-10-CM | POA: Diagnosis not present

## 2022-04-11 DIAGNOSIS — Z720 Tobacco use: Secondary | ICD-10-CM | POA: Diagnosis not present

## 2022-04-11 DIAGNOSIS — Z95828 Presence of other vascular implants and grafts: Secondary | ICD-10-CM | POA: Diagnosis not present

## 2022-04-14 DIAGNOSIS — D649 Anemia, unspecified: Secondary | ICD-10-CM | POA: Diagnosis not present

## 2022-04-14 DIAGNOSIS — I10 Essential (primary) hypertension: Secondary | ICD-10-CM | POA: Diagnosis not present

## 2022-04-14 DIAGNOSIS — Z0000 Encounter for general adult medical examination without abnormal findings: Secondary | ICD-10-CM | POA: Diagnosis not present

## 2022-04-14 DIAGNOSIS — Z8679 Personal history of other diseases of the circulatory system: Secondary | ICD-10-CM | POA: Diagnosis not present

## 2022-04-14 DIAGNOSIS — R160 Hepatomegaly, not elsewhere classified: Secondary | ICD-10-CM | POA: Diagnosis not present

## 2022-04-14 DIAGNOSIS — F17210 Nicotine dependence, cigarettes, uncomplicated: Secondary | ICD-10-CM | POA: Diagnosis not present

## 2022-04-14 DIAGNOSIS — Z8673 Personal history of transient ischemic attack (TIA), and cerebral infarction without residual deficits: Secondary | ICD-10-CM | POA: Diagnosis not present

## 2022-04-14 DIAGNOSIS — E049 Nontoxic goiter, unspecified: Secondary | ICD-10-CM | POA: Diagnosis not present

## 2022-04-14 DIAGNOSIS — L930 Discoid lupus erythematosus: Secondary | ICD-10-CM | POA: Diagnosis not present

## 2022-04-15 ENCOUNTER — Other Ambulatory Visit: Payer: Self-pay | Admitting: Internal Medicine

## 2022-04-15 ENCOUNTER — Encounter: Payer: Self-pay | Admitting: Internal Medicine

## 2022-04-15 DIAGNOSIS — R160 Hepatomegaly, not elsewhere classified: Secondary | ICD-10-CM

## 2022-04-15 DIAGNOSIS — I10 Essential (primary) hypertension: Secondary | ICD-10-CM

## 2022-04-15 DIAGNOSIS — D649 Anemia, unspecified: Secondary | ICD-10-CM

## 2022-04-15 DIAGNOSIS — Z720 Tobacco use: Secondary | ICD-10-CM

## 2022-04-21 ENCOUNTER — Inpatient Hospital Stay: Payer: Medicare HMO | Attending: Internal Medicine | Admitting: Internal Medicine

## 2022-04-21 ENCOUNTER — Encounter: Payer: Self-pay | Admitting: Internal Medicine

## 2022-04-21 ENCOUNTER — Inpatient Hospital Stay: Payer: Medicare HMO

## 2022-04-21 VITALS — BP 162/70 | HR 73 | Temp 96.6°F | Resp 18 | Wt 163.5 lb

## 2022-04-21 DIAGNOSIS — E611 Iron deficiency: Secondary | ICD-10-CM | POA: Diagnosis not present

## 2022-04-21 DIAGNOSIS — F17210 Nicotine dependence, cigarettes, uncomplicated: Secondary | ICD-10-CM | POA: Insufficient documentation

## 2022-04-21 DIAGNOSIS — D649 Anemia, unspecified: Secondary | ICD-10-CM | POA: Diagnosis not present

## 2022-04-21 DIAGNOSIS — D573 Sickle-cell trait: Secondary | ICD-10-CM

## 2022-04-21 DIAGNOSIS — Z79899 Other long term (current) drug therapy: Secondary | ICD-10-CM | POA: Diagnosis not present

## 2022-04-21 DIAGNOSIS — Z8673 Personal history of transient ischemic attack (TIA), and cerebral infarction without residual deficits: Secondary | ICD-10-CM | POA: Diagnosis not present

## 2022-04-21 DIAGNOSIS — I10 Essential (primary) hypertension: Secondary | ICD-10-CM | POA: Diagnosis not present

## 2022-04-21 LAB — CBC WITH DIFFERENTIAL/PLATELET
Abs Immature Granulocytes: 0.04 10*3/uL (ref 0.00–0.07)
Basophils Absolute: 0 10*3/uL (ref 0.0–0.1)
Basophils Relative: 1 %
Eosinophils Absolute: 0 10*3/uL (ref 0.0–0.5)
Eosinophils Relative: 0 %
HCT: 33.6 % — ABNORMAL LOW (ref 36.0–46.0)
Hemoglobin: 10.7 g/dL — ABNORMAL LOW (ref 12.0–15.0)
Immature Granulocytes: 1 %
Lymphocytes Relative: 41 %
Lymphs Abs: 1.7 10*3/uL (ref 0.7–4.0)
MCH: 24.9 pg — ABNORMAL LOW (ref 26.0–34.0)
MCHC: 31.8 g/dL (ref 30.0–36.0)
MCV: 78.1 fL — ABNORMAL LOW (ref 80.0–100.0)
Monocytes Absolute: 0.2 10*3/uL (ref 0.1–1.0)
Monocytes Relative: 6 %
Neutro Abs: 2.2 10*3/uL (ref 1.7–7.7)
Neutrophils Relative %: 51 %
Platelets: 173 10*3/uL (ref 150–400)
RBC: 4.3 MIL/uL (ref 3.87–5.11)
RDW: 16.3 % — ABNORMAL HIGH (ref 11.5–15.5)
WBC: 4.1 10*3/uL (ref 4.0–10.5)
nRBC: 0 % (ref 0.0–0.2)

## 2022-04-21 LAB — IRON AND TIBC
Iron: 61 ug/dL (ref 28–170)
Saturation Ratios: 18 % (ref 10.4–31.8)
TIBC: 332 ug/dL (ref 250–450)
UIBC: 271 ug/dL

## 2022-04-21 LAB — COMPREHENSIVE METABOLIC PANEL
ALT: 14 U/L (ref 0–44)
AST: 26 U/L (ref 15–41)
Albumin: 4 g/dL (ref 3.5–5.0)
Alkaline Phosphatase: 64 U/L (ref 38–126)
Anion gap: 6 (ref 5–15)
BUN: 15 mg/dL (ref 8–23)
CO2: 27 mmol/L (ref 22–32)
Calcium: 9.3 mg/dL (ref 8.9–10.3)
Chloride: 105 mmol/L (ref 98–111)
Creatinine, Ser: 0.76 mg/dL (ref 0.44–1.00)
GFR, Estimated: >60 mL/min (ref >60)
Glucose, Bld: 68 mg/dL — ABNORMAL LOW (ref 70–99)
Potassium: 3.7 mmol/L (ref 3.5–5.1)
Sodium: 138 mmol/L (ref 135–145)
Total Bilirubin: 0.3 mg/dL (ref 0.3–1.2)
Total Protein: 7.9 g/dL (ref 6.5–8.1)

## 2022-04-21 LAB — FERRITIN: Ferritin: 158 ng/mL (ref 11–307)

## 2022-04-21 LAB — RETICULOCYTES
Immature Retic Fract: 15.7 % (ref 2.3–15.9)
RBC.: 4.28 MIL/uL (ref 3.87–5.11)
Retic Count, Absolute: 50.1 10*3/uL (ref 19.0–186.0)
Retic Ct Pct: 1.2 % (ref 0.4–3.1)

## 2022-04-21 LAB — LACTATE DEHYDROGENASE: LDH: 162 U/L (ref 98–192)

## 2022-04-21 NOTE — Assessment & Plan Note (Addendum)
#   Chronic intermittent anemia/ Hx of sickle cell trait- moderate anemia; hemoglobin 10-11; intermittent microcytosis 76 MCV. March 2024- ferritin- 241.  123456 folic acid normal normal renal function.   # I had a long discussion with the patient regarding etiology of unexplained anemia.The etiologies include -benign like nutritional/malabsorption, liver disease, chronic kidney disease viral infections, autoimmune; blood loss etc.  Low clinical concern for any malignant causes.  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   # I recommend CBC CMP LDH haptoglobin 123456 folic acid reticulocyte count platelet immature fraction; review of peripheral smear.  Iron studies; ferritin. myeloma panel kappa lambda light chain ratio.  At this time based on chronic anemia/asymptomatic nature-low yield the bone marrow biopsy. For now hold off bone marrow biopsy/imaging at this time.  Check hemoglobin electrophoresis.  #Etiology of iron deficiency: Unclear; colonoscopy- 2021? [[facility]].  Await above workup.  # Left sided stroke- stable.   Thank you Dr. Ginette Pitman for allowing me to participate in the care of your pleasant patient. Please do not hesitate to contact me with questions or concerns in the interim.  # DISPOSITION: # labs today- ordered.  # follow up in 2-3 weeks- MD: no labs- Dr.B

## 2022-04-21 NOTE — Progress Notes (Signed)
Patient is referred by Dr. Ginette Pitman for anemia.

## 2022-04-21 NOTE — Progress Notes (Signed)
[facility] NOTE  Patient Care Team: Tracie Harrier, MD as PCP - General (Internal Medicine)  CHIEF COMPLAINTS/PURPOSE OF CONSULTATION: ANEMIA   HEMATOLOGY HISTORY  # ANEMIA[Hb; MCV-platelets- WBC; Iron sat; ferritin;  GFR- CT/US; EGD/colonoscopy-  Hemoglobin 10.3 Low    10.8 Low    10.9 Low    11.6 Low    11.0 Low    10.2 Low          HISTORY OF PRESENTING ILLNESS: Ambulating with a cane/left-sided stroke.  Susan Farrell 77 y.o.  female pleasant patient was been referred to [place] for further evaluation of anemia.  Patient denies any worsening shortness of breath or cough.  Denies any nausea vomiting abdominal pain.  Blood in stools:  none; Colonoscopy 2021 [[facility]]-; No EGD-  Blood in urine: none Difficulty swallowing: none Change of bowel movement/constipation: none Prior blood transfusion: none Prior history of blood loss:  none Kidney/Liver disease: none Alcohol:  none Bariatric surgery: none  Vaginal bleeding: s/p TAH; none Prior evaluation with hematology: none Prior bone marrow biopsy: none Oral iron:  on PO iron pills; Constipation- on stool softener Prior IV iron infusions: none   Review of Systems  Constitutional:  Negative for chills, diaphoresis, fever, malaise/fatigue and weight loss.  HENT:  Negative for nosebleeds and sore throat.   Eyes:  Negative for double vision.  Respiratory:  Negative for cough, hemoptysis, sputum production, shortness of breath and wheezing.   Cardiovascular:  Negative for chest pain, palpitations, orthopnea and leg swelling.  Gastrointestinal:  Negative for abdominal pain, blood in stool, constipation, diarrhea, heartburn, melena, nausea and vomiting.  Genitourinary:  Negative for dysuria, frequency and urgency.  Musculoskeletal:  Negative for back pain and joint pain.  Skin: Negative.  Negative for itching and rash.  Neurological:  Negative for dizziness, tingling, focal weakness, weakness and  headaches.  Endo/Heme/Allergies:  Does not bruise/bleed easily.  Psychiatric/Behavioral:  Negative for depression. The patient is not nervous/anxious and does not have insomnia.      MEDICAL HISTORY:  Past Medical History:  Diagnosis Date   Hypertension    Stroke     SURGICAL HISTORY: Past Surgical History:  Procedure Laterality Date   ABDOMINAL HYSTERECTOMY     CHOLECYSTECTOMY     ENDOVASCULAR REPAIR/STENT GRAFT N/A 06/12/2020   Procedure: ENDOVASCULAR REPAIR/STENT GRAFT;  Surgeon: Katha Cabal, MD;  Location: [facility];  Service: Cardiovascular;  Laterality: N/A;    SOCIAL HISTORY: Social History   Socioeconomic History   Marital status: Married    Spouse name: Not on file   Number of children: Not on file   Years of education: Not on file   Highest education level: Not on file  Occupational History   Not on file  Tobacco Use   Smoking status: Every Day    Types: Cigarettes   Smokeless tobacco: Current  Vaping Use   Vaping Use: Never used  Substance and Sexual Activity   Alcohol use: Not Currently   Drug use: Never   Sexual activity: Yes  Other Topics Concern   Not on file  Social History Narrative   Not on file   Social Determinants of Health   Financial Resource Strain: Low Risk  (04/21/2022)   Overall Financial Resource Strain (CARDIA)    Difficulty of Paying Living Expenses: Not very hard  Food Insecurity: No Food Insecurity (04/21/2022)   Hunger Vital Sign    Worried About Running Out of Food in the Last Year:  Never true    Ran Out of Food in the Last Year: Never true  Transportation Needs: No Transportation Needs (04/21/2022)   PRAPARE - Hydrologist (Medical): No    Lack of Transportation (Non-Medical): No  Physical Activity: Not on file  Stress: No Stress Concern Present (04/21/2022)   [place]    Feeling of Stress : Not at all  Social  Connections: Not on file  Intimate Partner Violence: Not At Risk (04/21/2022)   Humiliation, Afraid, Rape, and Kick questionnaire    Fear of Current or Ex-Partner: No    Emotionally Abused: No    Physically Abused: No    Sexually Abused: No    FAMILY HISTORY: Family History  Problem Relation Age of Onset   Hypertension Mother    Hypertension Father    Diabetes Sister    Breast cancer Neg Hx     ALLERGIES:  has No Known Allergies.  MEDICATIONS:  Current Outpatient Medications  Medication Sig Dispense Refill   amLODipine (NORVASC) 2.5 MG tablet Take 2.5 mg by mouth daily.     aspirin EC 81 MG tablet Take 1 tablet (81 mg total) by mouth daily. Swallow whole. 30 tablet    Calcium Carbonate-Vitamin D 600-200 MG-UNIT TABS Take 1 tablet by mouth daily. 30 tablet 0   docusate sodium (COLACE) 100 MG capsule Take 100 mg by mouth daily.     halobetasol (ULTRAVATE) 0.05 % ointment Apply topically 2 (two) times daily.     iron polysaccharides (FERREX 150) 150 MG capsule Take 1 capsule by mouth daily.     metoprolol succinate (TOPROL-XL) 50 MG 24 hr tablet Take 1 tablet (50 mg total) by mouth daily. 30 tablet 0   Multiple Vitamins-Minerals (WOMENS MULTIVITAMIN + COLLAGEN PO) Take 1 tablet by mouth daily.     rosuvastatin (CRESTOR) 40 MG tablet Take 1 tablet (40 mg total) by mouth daily. 30 tablet 0   SYNTHROID 100 MCG tablet Take 1 tablet (100 mcg total) by mouth daily. 30 tablet 0   Varenicline Tartrate, Starter, 0.5 MG X 11 & 1 MG X 42 TBPK See admin instructions.     meclizine (ANTIVERT) 25 MG tablet Take 1 tablet (25 mg total) by mouth 3 (three) times daily as needed for dizziness. (Patient not taking: Reported on 04/21/2022) 15 tablet 0   tiZANidine (ZANAFLEX) 2 MG tablet Take 2 mg by mouth 3 (three) times daily. (Patient not taking: Reported on 04/21/2022)     No current facility-administered medications for this visit.     PHYSICAL EXAMINATION:   Vitals:   04/21/22 1133  BP: (!)  162/70  Pulse: 73  Resp: 18  Temp: (!) 96.6 F (35.9 C)  SpO2: 99%   Filed Weights   04/21/22 1133  Weight: 163 lb 8 oz (74.2 kg)    Physical Exam Vitals and nursing note reviewed.  HENT:     Head: Normocephalic and atraumatic.     Mouth/Throat:     Pharynx: Oropharynx is clear.  Eyes:     Extraocular Movements: Extraocular movements intact.     Pupils: Pupils are equal, round, and reactive to light.  Cardiovascular:     Rate and Rhythm: Normal rate and regular rhythm.  Pulmonary:     Comments: Decreased breath sounds bilaterally.  Abdominal:     Palpations: Abdomen is soft.  Musculoskeletal:        General: Normal range of motion.  Cervical back: Normal range of motion.  Skin:    General: Skin is warm.  Neurological:     General: No focal deficit present.     Mental Status: She is alert and oriented to person, place, and time.  Psychiatric:        Behavior: Behavior normal.        Judgment: Judgment normal.      LABORATORY DATA:  I have reviewed the data as listed Lab Results  Component Value Date   WBC 4.1 04/21/2022   HGB 10.7 (L) 04/21/2022   HCT 33.6 (L) 04/21/2022   MCV 78.1 (L) 04/21/2022   PLT 173 04/21/2022   Recent Labs    08/28/21 2018 08/29/21 0427 09/03/21 0542 09/09/21 0727 11/09/21 0718 04/21/22 1223  NA 141   < > 140  --  142 138  K 3.6   < > 4.1  --  3.1* 3.7  CL 106   < > 109  --  111 105  CO2 27   < > 24  --  24 27  GLUCOSE 112*   < > 96  --  119* 68*  BUN 10   < > 13  --  11 15  CREATININE 0.99   < > 0.73 0.85 0.72 0.76  CALCIUM 9.2   < > 8.9  --  9.0 9.3  GFRNONAA 60*   < > >60 >60 >60 >60  PROT 7.4  --  6.5  --   --  7.9  ALBUMIN 3.9  --  3.3*  --   --  4.0  AST 21  --  25  --   --  26  ALT 10  --  16  --   --  14  ALKPHOS 64  --  53  --   --  64  BILITOT 0.6  --  0.4  --   --  0.3   < > = values in this interval not displayed.     No results found.  ASSESSMENT & PLAN:   Symptomatic anemia # Chronic  intermittent anemia/ Hx of sickle cell trait- moderate anemia; hemoglobin 10-11; intermittent microcytosis 76 MCV. March 2024- ferritin- 241.  123456 folic acid normal normal renal function.   # I had a long discussion with the patient regarding etiology of unexplained anemia.The etiologies include -benign like nutritional/malabsorption, liver disease, chronic kidney disease viral infections, autoimmune; blood loss etc.  Low clinical concern for any malignant causes.  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   # I recommend CBC CMP LDH haptoglobin 123456 folic acid reticulocyte count platelet immature fraction; review of peripheral smear.  Iron studies; ferritin. myeloma panel kappa lambda light chain ratio.  At this time based on chronic anemia/asymptomatic nature-low yield the bone marrow biopsy. For now hold off bone marrow biopsy/imaging at this time.  Check hemoglobin electrophoresis.  #Etiology of iron deficiency: Unclear; colonoscopy- 2021? [[facility]].  Await above workup.  # Left sided stroke- stable.   Thank you Dr. Ginette Pitman for allowing me to participate in the care of your pleasant patient. Please do not hesitate to contact me with questions or concerns in the interim.  # DISPOSITION: # labs today- ordered.  # follow up in 2-3 weeks- MD: no labs- Dr.B  All questions were answered. The patient knows to call the clinic with any problems, questions or concerns.  Cammie Sickle, MD 04/21/2022 12:58 PM

## 2022-04-22 LAB — HAPTOGLOBIN: Haptoglobin: 85 mg/dL (ref 42–346)

## 2022-04-22 LAB — KAPPA/LAMBDA LIGHT CHAINS
Kappa free light chain: 92.1 mg/L — ABNORMAL HIGH (ref 3.3–19.4)
Kappa, lambda light chain ratio: 2.1 — ABNORMAL HIGH (ref 0.26–1.65)
Lambda free light chains: 43.8 mg/L — ABNORMAL HIGH (ref 5.7–26.3)

## 2022-04-23 LAB — HGB FRACTIONATION CASCADE
Hgb A2: 3 % (ref 1.8–3.2)
Hgb A: 60.4 % — ABNORMAL LOW (ref 96.4–98.8)
Hgb F: 0 % (ref 0.0–2.0)
Hgb S: 36.6 % — ABNORMAL HIGH

## 2022-04-23 LAB — HGB SOLUBILITY: Hgb Solubility: POSITIVE — AB

## 2022-04-24 ENCOUNTER — Ambulatory Visit
Admission: RE | Admit: 2022-04-24 | Discharge: 2022-04-24 | Disposition: A | Discharge status: Home / Self Care | Payer: Medicare HMO | Source: Ambulatory Visit | Attending: Internal Medicine | Admitting: Internal Medicine

## 2022-04-24 DIAGNOSIS — K7689 Other specified diseases of liver: Secondary | ICD-10-CM | POA: Diagnosis not present

## 2022-04-24 DIAGNOSIS — D649 Anemia, unspecified: Secondary | ICD-10-CM | POA: Diagnosis not present

## 2022-04-24 DIAGNOSIS — Z720 Tobacco use: Secondary | ICD-10-CM | POA: Insufficient documentation

## 2022-04-24 DIAGNOSIS — I10 Essential (primary) hypertension: Secondary | ICD-10-CM | POA: Insufficient documentation

## 2022-04-24 DIAGNOSIS — R160 Hepatomegaly, not elsewhere classified: Secondary | ICD-10-CM | POA: Diagnosis not present

## 2022-04-24 LAB — MULTIPLE MYELOMA PANEL, SERUM
Albumin SerPl Elph-Mcnc: 3.9 g/dL (ref 2.9–4.4)
Albumin/Glob SerPl: 1.1 (ref 0.7–1.7)
Alpha 1: 0.3 g/dL (ref 0.0–0.4)
Alpha2 Glob SerPl Elph-Mcnc: 0.6 g/dL (ref 0.4–1.0)
B-Globulin SerPl Elph-Mcnc: 1.5 g/dL — ABNORMAL HIGH (ref 0.7–1.3)
Gamma Glob SerPl Elph-Mcnc: 1.2 g/dL (ref 0.4–1.8)
Globulin, Total: 3.6 g/dL (ref 2.2–3.9)
IgA: 612 mg/dL — ABNORMAL HIGH (ref 64–422)
IgG (Immunoglobin G), Serum: 1428 mg/dL (ref 586–1602)
IgM (Immunoglobulin M), Srm: 120 mg/dL (ref 26–217)
Total Protein ELP: 7.5 g/dL (ref 6.0–8.5)

## 2022-04-24 MED ORDER — IOHEXOL 300 MG/ML  SOLN
100.0000 mL | Freq: Once | INTRAMUSCULAR | Status: AC | PRN
  Administered 2022-04-24: 100 mL via INTRAVENOUS

## 2022-05-14 ENCOUNTER — Encounter: Payer: Self-pay | Admitting: Internal Medicine

## 2022-05-14 ENCOUNTER — Inpatient Hospital Stay (HOSPITAL_BASED_OUTPATIENT_CLINIC_OR_DEPARTMENT_OTHER): Payer: Medicare HMO | Admitting: Internal Medicine

## 2022-05-14 VITALS — BP 146/67 | HR 67 | Temp 98.4°F | Ht 66.0 in | Wt 167.1 lb

## 2022-05-14 DIAGNOSIS — D573 Sickle-cell trait: Secondary | ICD-10-CM | POA: Diagnosis not present

## 2022-05-14 DIAGNOSIS — D649 Anemia, unspecified: Secondary | ICD-10-CM

## 2022-05-14 DIAGNOSIS — F17210 Nicotine dependence, cigarettes, uncomplicated: Secondary | ICD-10-CM | POA: Diagnosis not present

## 2022-05-14 DIAGNOSIS — Z8673 Personal history of transient ischemic attack (TIA), and cerebral infarction without residual deficits: Secondary | ICD-10-CM | POA: Diagnosis not present

## 2022-05-14 DIAGNOSIS — I10 Essential (primary) hypertension: Secondary | ICD-10-CM | POA: Diagnosis not present

## 2022-05-14 DIAGNOSIS — E611 Iron deficiency: Secondary | ICD-10-CM | POA: Diagnosis not present

## 2022-05-14 DIAGNOSIS — Z79899 Other long term (current) drug therapy: Secondary | ICD-10-CM | POA: Diagnosis not present

## 2022-05-14 NOTE — Addendum Note (Signed)
Addended by: Darrold Span A on: 05/14/2022 02:07 PM   Modules accepted: Orders

## 2022-05-14 NOTE — Progress Notes (Signed)
Fatigue/weakness: no Dyspena: no Light headedness: no Blood in stool: no  No concerns today.

## 2022-05-14 NOTE — Progress Notes (Signed)
[facility] CONSULT [facility]  Patient Care Team: Barbette Reichmann, MD as PCP - General (Internal Medicine)  CHIEF COMPLAINTS/PURPOSE OF CONSULTATION: ANEMIA   HEMATOLOGY HISTORY  # ANEMIA[Hb; MCV-platelets- WBC; Iron sat; ferritin;  GFR- CT/US; Colonoscopy 2021 [[facility]]-; No EGD-   Hemoglobin 10.3 Low    10.8 Low    10.9 Low    11.6 Low    11.0 Low    10.2 Low          HISTORY OF PRESENTING ILLNESS: Ambulating with a cane/left-sided stroke.  Susan Farrell 77 y.o.  female pleasant patient chronic anemia is here today to review the labs/workup.  Fatigue/weakness: no Dyspena: no Light headedness: no Blood in stool: no   No concerns today.  Patient denies any worsening shortness of breath or cough.  Denies any nausea vomiting abdominal pain.   Review of Systems  Constitutional:  Negative for chills, diaphoresis, fever, malaise/fatigue and weight loss.  HENT:  Negative for nosebleeds and sore throat.   Eyes:  Negative for double vision.  Respiratory:  Negative for cough, hemoptysis, sputum production, shortness of breath and wheezing.   Cardiovascular:  Negative for chest pain, palpitations, orthopnea and leg swelling.  Gastrointestinal:  Negative for abdominal pain, blood in stool, constipation, diarrhea, heartburn, melena, nausea and vomiting.  Genitourinary:  Negative for dysuria, frequency and urgency.  Musculoskeletal:  Negative for back pain and joint pain.  Skin: Negative.  Negative for itching and rash.  Neurological:  Negative for dizziness, tingling, focal weakness, weakness and headaches.  Endo/Heme/Allergies:  Does not bruise/bleed easily.  Psychiatric/Behavioral:  Negative for depression. The patient is not nervous/anxious and does not have insomnia.     MEDICAL HISTORY:  Past Medical History:  Diagnosis Date   Hypertension    Stroke     SURGICAL HISTORY: Past Surgical History:  Procedure Laterality Date   ABDOMINAL HYSTERECTOMY      CHOLECYSTECTOMY     ENDOVASCULAR REPAIR/STENT GRAFT N/A 06/12/2020   Procedure: ENDOVASCULAR REPAIR/STENT GRAFT;  Surgeon: Renford Dills, MD;  Location: [facility];  Service: Cardiovascular;  Laterality: N/A;    SOCIAL HISTORY: Social History   Socioeconomic History   Marital status: Married    Spouse name: Not on file   Number of children: Not on file   Years of education: Not on file   Highest education level: Not on file  Occupational History   Not on file  Tobacco Use   Smoking status: Every Day    Types: Cigarettes   Smokeless tobacco: Current  Vaping Use   Vaping Use: Never used  Substance and Sexual Activity   Alcohol use: Not Currently   Drug use: Never   Sexual activity: Yes  Other Topics Concern   Not on file  Social History Narrative   Not on file   Social Determinants of Health   Financial Resource Strain: Low Risk  (04/21/2022)   Overall Financial Resource Strain (CARDIA)    Difficulty of Paying Living Expenses: Not very hard  Food Insecurity: No Food Insecurity (04/21/2022)   Hunger Vital Sign    Worried About Running Out of Food in the Last Year: Never true    Ran Out of Food in the Last Year: Never true  Transportation Needs: No Transportation Needs (04/21/2022)   PRAPARE - Administrator, Civil Service (Medical): No    Lack of Transportation (Non-Medical): No  Physical Activity: Not on file  Stress: No Stress Concern Present (04/21/2022)  [name] of Occupational Health - Occupational Stress Questionnaire    Feeling of Stress : Not at all  Social Connections: Not on file  Intimate Partner Violence: Not At Risk (04/21/2022)   Humiliation, Afraid, Rape, and Kick questionnaire    Fear of Current or Ex-Partner: No    Emotionally Abused: No    Physically Abused: No    Sexually Abused: No    FAMILY HISTORY: Family History  Problem Relation Age of Onset   Hypertension Mother    Hypertension Father    Diabetes Sister     Breast cancer Neg Hx     ALLERGIES:  has No Known Allergies.  MEDICATIONS:  Current Outpatient Medications  Medication Sig Dispense Refill   amLODipine (NORVASC) 2.5 MG tablet Take 2.5 mg by mouth daily.     aspirin EC 81 MG tablet Take 1 tablet (81 mg total) by mouth daily. Swallow whole. 30 tablet    Calcium Carbonate-Vitamin D 600-200 MG-UNIT TABS Take 1 tablet by mouth daily. 30 tablet 0   docusate sodium (COLACE) 100 MG capsule Take 100 mg by mouth daily.     halobetasol (ULTRAVATE) 0.05 % ointment Apply topically 2 (two) times daily.     iron polysaccharides (FERREX 150) 150 MG capsule Take 1 capsule by mouth daily.     metoprolol succinate (TOPROL-XL) 50 MG 24 hr tablet Take 1 tablet (50 mg total) by mouth daily. 30 tablet 0   Multiple Vitamins-Minerals (WOMENS MULTIVITAMIN + COLLAGEN PO) Take 1 tablet by mouth daily.     SYNTHROID 100 MCG tablet Take 1 tablet (100 mcg total) by mouth daily. 30 tablet 0   Varenicline Tartrate, Starter, 0.5 MG X 11 & 1 MG X 42 TBPK See admin instructions.     meclizine (ANTIVERT) 25 MG tablet Take 1 tablet (25 mg total) by mouth 3 (three) times daily as needed for dizziness. (Patient not taking: Reported on 04/21/2022) 15 tablet 0   rosuvastatin (CRESTOR) 40 MG tablet Take 1 tablet (40 mg total) by mouth daily. 30 tablet 0   tiZANidine (ZANAFLEX) 2 MG tablet Take 2 mg by mouth 3 (three) times daily. (Patient not taking: Reported on 04/21/2022)     No current facility-administered medications for this visit.     PHYSICAL EXAMINATION:   Vitals:   05/14/22 1253  BP: (!) 146/67  Pulse: 67  Temp: 98.4 F (36.9 C)  SpO2: 100%   Filed Weights   05/14/22 1253  Weight: 167 lb 1.6 oz (75.8 kg)    Physical Exam Vitals and nursing note reviewed.  HENT:     Head: Normocephalic and atraumatic.     Mouth/Throat:     Pharynx: Oropharynx is clear.  Eyes:     Extraocular Movements: Extraocular movements intact.     Pupils: Pupils are equal, round,  and reactive to light.  Cardiovascular:     Rate and Rhythm: Normal rate and regular rhythm.  Pulmonary:     Comments: Decreased breath sounds bilaterally.  Abdominal:     Palpations: Abdomen is soft.  Musculoskeletal:        General: Normal range of motion.     Cervical back: Normal range of motion.  Skin:    General: Skin is warm.  Neurological:     General: No focal deficit present.     Mental Status: She is alert and oriented to person, place, and time.  Psychiatric:        Behavior: Behavior normal.  Judgment: Judgment normal.      LABORATORY DATA:  I have reviewed the data as listed Lab Results  Component Value Date   WBC 4.1 04/21/2022   HGB 10.7 (L) 04/21/2022   HCT 33.6 (L) 04/21/2022   MCV 78.1 (L) 04/21/2022   PLT 173 04/21/2022   Recent Labs    08/28/21 2018 08/29/21 0427 09/03/21 0542 09/09/21 0727 11/09/21 0718 04/21/22 1223  NA 141   < > 140  --  142 138  K 3.6   < > 4.1  --  3.1* 3.7  CL 106   < > 109  --  111 105  CO2 27   < > 24  --  24 27  GLUCOSE 112*   < > 96  --  119* 68*  BUN 10   < > 13  --  11 15  CREATININE 0.99   < > 0.73 0.85 0.72 0.76  CALCIUM 9.2   < > 8.9  --  9.0 9.3  GFRNONAA 60*   < > >60 >60 >60 >60  PROT 7.4  --  6.5  --   --  7.9  ALBUMIN 3.9  --  3.3*  --   --  4.0  AST 21  --  25  --   --  26  ALT 10  --  16  --   --  14  ALKPHOS 64  --  53  --   --  64  BILITOT 0.6  --  0.4  --   --  0.3   < > = values in this interval not displayed.     CT ABDOMEN PELVIS W CONTRAST  Result Date: 04/25/2022 CLINICAL DATA:  Follow-up indeterminate left hepatic lobe mass. Abdominal aortic aneurysm. EXAM: CT ABDOMEN AND PELVIS WITH CONTRAST TECHNIQUE: Multidetector CT imaging of the abdomen and pelvis was performed using the standard protocol following bolus administration of intravenous contrast. RADIATION DOSE REDUCTION: This exam was performed according to the departmental dose-optimization program which includes automated  exposure control, adjustment of the mA and/or kV according to patient size and/or use of iterative reconstruction technique. CONTRAST:  OMNIPAQUE IOHEXOL 300 MG/ML  SOLN COMPARISON:  06/12/2020 FINDINGS: Lower Chest: No acute findings. Hepatobiliary: Benign hemangioma is seen in segment 3 of the left hepatic lobe which measures 6.0 x 4.2 cm. This shows no significant change in size since previous study. No evidence of hemorrhage. Prior cholecystectomy. No evidence of biliary obstruction. Pancreas:  No mass or inflammatory changes. Spleen: Within normal limits in size and appearance. Adrenals/Urinary Tract: No suspicious masses identified. No evidence of ureteral calculi or hydronephrosis. Stomach/Bowel: No evidence of obstruction, inflammatory process or abnormal fluid collections. Normal appendix visualized. Vascular/Lymphatic: No pathologically enlarged lymph nodes. No acute vascular findings. There has been stent graft repair of abdominal aortic aneurysm since prior study. The native aneurysm sac measures 6.7 cm in maximum diameter compared to 8.2 cm previously. Reproductive: Prior hysterectomy noted. Adnexal regions are unremarkable in appearance. Other:  None. Musculoskeletal:  No suspicious bone lesions identified. 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 Interval stent graft repair of abdominal aortic aneurysm, with mild decrease in size of native aneurysm sac. Electronically Signed   By: Danae Orleans M.D.   On: 04/25/2022 18:19    ASSESSMENT & PLAN:   Symptomatic anemia # Chronic intermittent anemia/ Hx of sickle cell trait [Hb electrophoresis/sickle cell]- moderate anemia; hemoglobin 10-11; intermittent microcytosis 76 MCV. March 2024- ferritin- 241.  B12 folic acid normal normal renal function.   # Low clinical concern for any malignant causes.  Suspect mild anemia-sec to thalassemia trait/sickle cell trait. s.  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  discussed regarding the possible diagnosis of MGUS.  I discussed  with the patient regarding natural history of MGUS; small risk of progression to multiple myeloma. Patient is highly unlikely at this time patient has any active myeloma-although given  anemia.  Hold off any bone marrow biopsy.  Will recheck again in 6 months.  # Left sided stroke- stable.   # DISPOSITION: # follow up in  6 months-- MD; 2 weeks prior- labs-cbc/cmp; MM panel; K/L light chain ratio -Dr.Bcc; Dr.hande  All questions were answered. The patient knows to call the clinic with any problems, questions or concerns.  Earna Coder, MD 05/14/2022 1:45 PM

## 2022-05-14 NOTE — Assessment & Plan Note (Addendum)
#   Chronic intermittent anemia/ Hx of sickle cell trait [Hb electrophoresis/sickle cell]- moderate anemia; hemoglobin 10-11; intermittent microcytosis 76 MCV. March 2024- ferritin- 241.  B12 folic acid normal normal renal function.   # Low clinical concern for any malignant causes.  Suspect mild anemia-sec to thalassemia trait/sickle cell trait. s.  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 discussed regarding the possible diagnosis of MGUS.  I discussed  with the patient regarding natural history of MGUS; small risk of progression to multiple myeloma. Patient is highly unlikely at this time patient has any active myeloma-although given  anemia.  Hold off any bone marrow biopsy.  Will recheck again in 6 months.  # Left sided stroke- stable.   # DISPOSITION: # follow up in  6 months-- MD; 2 weeks prior- labs-cbc/cmp; MM panel; K/L light chain ratio -Dr.Bcc; Dr.hande

## 2022-06-19 ENCOUNTER — Other Ambulatory Visit (INDEPENDENT_AMBULATORY_CARE_PROVIDER_SITE_OTHER): Payer: Self-pay | Admitting: Nurse Practitioner

## 2022-06-19 DIAGNOSIS — I714 Abdominal aortic aneurysm, without rupture, unspecified: Secondary | ICD-10-CM

## 2022-06-23 ENCOUNTER — Ambulatory Visit (INDEPENDENT_AMBULATORY_CARE_PROVIDER_SITE_OTHER): Payer: Medicare HMO

## 2022-06-23 ENCOUNTER — Ambulatory Visit (INDEPENDENT_AMBULATORY_CARE_PROVIDER_SITE_OTHER): Payer: Medicare HMO | Admitting: Vascular Surgery

## 2022-06-23 DIAGNOSIS — I714 Abdominal aortic aneurysm, without rupture, unspecified: Secondary | ICD-10-CM

## 2022-08-08 DIAGNOSIS — Z8673 Personal history of transient ischemic attack (TIA), and cerebral infarction without residual deficits: Secondary | ICD-10-CM | POA: Diagnosis not present

## 2022-08-08 DIAGNOSIS — Z720 Tobacco use: Secondary | ICD-10-CM | POA: Diagnosis not present

## 2022-08-08 DIAGNOSIS — L930 Discoid lupus erythematosus: Secondary | ICD-10-CM | POA: Diagnosis not present

## 2022-08-08 DIAGNOSIS — Z0000 Encounter for general adult medical examination without abnormal findings: Secondary | ICD-10-CM | POA: Diagnosis not present

## 2022-08-08 DIAGNOSIS — D696 Thrombocytopenia, unspecified: Secondary | ICD-10-CM | POA: Diagnosis not present

## 2022-08-08 DIAGNOSIS — R8290 Unspecified abnormal findings in urine: Secondary | ICD-10-CM | POA: Diagnosis not present

## 2022-08-08 DIAGNOSIS — E049 Nontoxic goiter, unspecified: Secondary | ICD-10-CM | POA: Diagnosis not present

## 2022-08-08 DIAGNOSIS — I10 Essential (primary) hypertension: Secondary | ICD-10-CM | POA: Diagnosis not present

## 2022-08-08 DIAGNOSIS — I714 Abdominal aortic aneurysm, without rupture, unspecified: Secondary | ICD-10-CM | POA: Diagnosis not present

## 2022-08-08 DIAGNOSIS — D649 Anemia, unspecified: Secondary | ICD-10-CM | POA: Diagnosis not present

## 2022-08-08 DIAGNOSIS — R7309 Other abnormal glucose: Secondary | ICD-10-CM | POA: Diagnosis not present

## 2022-08-15 ENCOUNTER — Other Ambulatory Visit: Payer: Self-pay | Admitting: Internal Medicine

## 2022-08-15 DIAGNOSIS — E049 Nontoxic goiter, unspecified: Secondary | ICD-10-CM | POA: Diagnosis not present

## 2022-08-15 DIAGNOSIS — Z8673 Personal history of transient ischemic attack (TIA), and cerebral infarction without residual deficits: Secondary | ICD-10-CM | POA: Diagnosis not present

## 2022-08-15 DIAGNOSIS — D649 Anemia, unspecified: Secondary | ICD-10-CM | POA: Diagnosis not present

## 2022-08-15 DIAGNOSIS — I10 Essential (primary) hypertension: Secondary | ICD-10-CM | POA: Diagnosis not present

## 2022-08-15 DIAGNOSIS — F17210 Nicotine dependence, cigarettes, uncomplicated: Secondary | ICD-10-CM | POA: Diagnosis not present

## 2022-08-15 DIAGNOSIS — Z1231 Encounter for screening mammogram for malignant neoplasm of breast: Secondary | ICD-10-CM

## 2022-08-15 DIAGNOSIS — Z95828 Presence of other vascular implants and grafts: Secondary | ICD-10-CM | POA: Diagnosis not present

## 2022-08-15 DIAGNOSIS — M8580 Other specified disorders of bone density and structure, unspecified site: Secondary | ICD-10-CM | POA: Diagnosis not present

## 2022-08-15 DIAGNOSIS — L930 Discoid lupus erythematosus: Secondary | ICD-10-CM | POA: Diagnosis not present

## 2022-08-20 DIAGNOSIS — L930 Discoid lupus erythematosus: Secondary | ICD-10-CM | POA: Diagnosis not present

## 2022-09-08 ENCOUNTER — Encounter: Payer: Self-pay | Admitting: *Deleted

## 2022-09-08 ENCOUNTER — Other Ambulatory Visit: Payer: Self-pay

## 2022-09-08 ENCOUNTER — Inpatient Hospital Stay
Admission: EM | Admit: 2022-09-08 | Discharge: 2022-09-12 | DRG: 065 | Disposition: A | Discharge status: Rehabilitation Facility | Payer: Medicare HMO | Attending: Internal Medicine | Admitting: Internal Medicine

## 2022-09-08 ENCOUNTER — Emergency Department: Payer: Medicare HMO

## 2022-09-08 DIAGNOSIS — I63541 Cerebral infarction due to unspecified occlusion or stenosis of right cerebellar artery: Principal | ICD-10-CM | POA: Diagnosis present

## 2022-09-08 DIAGNOSIS — R29703 NIHSS score 3: Secondary | ICD-10-CM | POA: Diagnosis present

## 2022-09-08 DIAGNOSIS — I6381 Other cerebral infarction due to occlusion or stenosis of small artery: Secondary | ICD-10-CM | POA: Diagnosis not present

## 2022-09-08 DIAGNOSIS — Z79899 Other long term (current) drug therapy: Secondary | ICD-10-CM

## 2022-09-08 DIAGNOSIS — Z79890 Hormone replacement therapy (postmenopausal): Secondary | ICD-10-CM

## 2022-09-08 DIAGNOSIS — E039 Hypothyroidism, unspecified: Secondary | ICD-10-CM | POA: Diagnosis not present

## 2022-09-08 DIAGNOSIS — E041 Nontoxic single thyroid nodule: Secondary | ICD-10-CM | POA: Diagnosis not present

## 2022-09-08 DIAGNOSIS — N390 Urinary tract infection, site not specified: Secondary | ICD-10-CM | POA: Diagnosis present

## 2022-09-08 DIAGNOSIS — R110 Nausea: Secondary | ICD-10-CM | POA: Diagnosis not present

## 2022-09-08 DIAGNOSIS — Z6827 Body mass index (BMI) 27.0-27.9, adult: Secondary | ICD-10-CM

## 2022-09-08 DIAGNOSIS — F17210 Nicotine dependence, cigarettes, uncomplicated: Secondary | ICD-10-CM | POA: Diagnosis present

## 2022-09-08 DIAGNOSIS — E785 Hyperlipidemia, unspecified: Secondary | ICD-10-CM | POA: Diagnosis present

## 2022-09-08 DIAGNOSIS — Z720 Tobacco use: Secondary | ICD-10-CM | POA: Insufficient documentation

## 2022-09-08 DIAGNOSIS — E663 Overweight: Secondary | ICD-10-CM | POA: Diagnosis present

## 2022-09-08 DIAGNOSIS — Z833 Family history of diabetes mellitus: Secondary | ICD-10-CM

## 2022-09-08 DIAGNOSIS — R112 Nausea with vomiting, unspecified: Secondary | ICD-10-CM | POA: Diagnosis present

## 2022-09-08 DIAGNOSIS — I639 Cerebral infarction, unspecified: Principal | ICD-10-CM

## 2022-09-08 DIAGNOSIS — M7989 Other specified soft tissue disorders: Secondary | ICD-10-CM | POA: Diagnosis present

## 2022-09-08 DIAGNOSIS — Z8249 Family history of ischemic heart disease and other diseases of the circulatory system: Secondary | ICD-10-CM | POA: Diagnosis not present

## 2022-09-08 DIAGNOSIS — R1110 Vomiting, unspecified: Secondary | ICD-10-CM | POA: Diagnosis not present

## 2022-09-08 DIAGNOSIS — Z8679 Personal history of other diseases of the circulatory system: Secondary | ICD-10-CM | POA: Diagnosis not present

## 2022-09-08 DIAGNOSIS — D638 Anemia in other chronic diseases classified elsewhere: Secondary | ICD-10-CM | POA: Diagnosis present

## 2022-09-08 DIAGNOSIS — G8191 Hemiplegia, unspecified affecting right dominant side: Secondary | ICD-10-CM | POA: Diagnosis present

## 2022-09-08 DIAGNOSIS — I6389 Other cerebral infarction: Secondary | ICD-10-CM | POA: Diagnosis not present

## 2022-09-08 DIAGNOSIS — Z1152 Encounter for screening for COVID-19: Secondary | ICD-10-CM

## 2022-09-08 DIAGNOSIS — I10 Essential (primary) hypertension: Secondary | ICD-10-CM | POA: Diagnosis not present

## 2022-09-08 DIAGNOSIS — K5900 Constipation, unspecified: Secondary | ICD-10-CM | POA: Diagnosis present

## 2022-09-08 DIAGNOSIS — E876 Hypokalemia: Secondary | ICD-10-CM | POA: Diagnosis present

## 2022-09-08 DIAGNOSIS — R9089 Other abnormal findings on diagnostic imaging of central nervous system: Secondary | ICD-10-CM | POA: Diagnosis not present

## 2022-09-08 DIAGNOSIS — L930 Discoid lupus erythematosus: Secondary | ICD-10-CM | POA: Diagnosis present

## 2022-09-08 DIAGNOSIS — R279 Unspecified lack of coordination: Secondary | ICD-10-CM | POA: Diagnosis present

## 2022-09-08 DIAGNOSIS — I517 Cardiomegaly: Secondary | ICD-10-CM | POA: Diagnosis not present

## 2022-09-08 DIAGNOSIS — I69354 Hemiplegia and hemiparesis following cerebral infarction affecting left non-dominant side: Secondary | ICD-10-CM | POA: Diagnosis not present

## 2022-09-08 DIAGNOSIS — R42 Dizziness and giddiness: Secondary | ICD-10-CM | POA: Diagnosis not present

## 2022-09-08 DIAGNOSIS — Z7982 Long term (current) use of aspirin: Secondary | ICD-10-CM | POA: Diagnosis not present

## 2022-09-08 DIAGNOSIS — R531 Weakness: Secondary | ICD-10-CM | POA: Diagnosis not present

## 2022-09-08 DIAGNOSIS — Z9889 Other specified postprocedural states: Secondary | ICD-10-CM

## 2022-09-08 DIAGNOSIS — I63549 Cerebral infarction due to unspecified occlusion or stenosis of unspecified cerebellar artery: Secondary | ICD-10-CM | POA: Diagnosis not present

## 2022-09-08 DIAGNOSIS — J984 Other disorders of lung: Secondary | ICD-10-CM | POA: Diagnosis not present

## 2022-09-08 DIAGNOSIS — D649 Anemia, unspecified: Secondary | ICD-10-CM | POA: Diagnosis present

## 2022-09-08 LAB — BASIC METABOLIC PANEL
Anion gap: 9 (ref 5–15)
BUN: 14 mg/dL (ref 8–23)
CO2: 25 mmol/L (ref 22–32)
Calcium: 9.1 mg/dL (ref 8.9–10.3)
Chloride: 104 mmol/L (ref 98–111)
Creatinine, Ser: 0.85 mg/dL (ref 0.44–1.00)
GFR, Estimated: >60 mL/min (ref >60)
Glucose, Bld: 138 mg/dL — ABNORMAL HIGH (ref 70–99)
Potassium: 4.2 mmol/L (ref 3.5–5.1)
Sodium: 138 mmol/L (ref 135–145)

## 2022-09-08 LAB — CBC
HCT: 32.7 % — ABNORMAL LOW (ref 36.0–46.0)
Hemoglobin: 10.1 g/dL — ABNORMAL LOW (ref 12.0–15.0)
MCH: 24 pg — ABNORMAL LOW (ref 26.0–34.0)
MCHC: 30.9 g/dL (ref 30.0–36.0)
MCV: 77.9 fL — ABNORMAL LOW (ref 80.0–100.0)
Platelets: 269 10*3/uL (ref 150–400)
RBC: 4.2 MIL/uL (ref 3.87–5.11)
RDW: 17.1 % — ABNORMAL HIGH (ref 11.5–15.5)
WBC: 8.1 10*3/uL (ref 4.0–10.5)
nRBC: 0 % (ref 0.0–0.2)

## 2022-09-08 LAB — TROPONIN I (HIGH SENSITIVITY): Troponin I (High Sensitivity): 10 ng/L (ref <18)

## 2022-09-08 MED ORDER — SODIUM CHLORIDE 0.9 % IV BOLUS
500.0000 mL | Freq: Once | INTRAVENOUS | Status: AC
  Administered 2022-09-09: 500 mL via INTRAVENOUS

## 2022-09-08 MED ORDER — ONDANSETRON HCL 4 MG/2ML IJ SOLN
4.0000 mg | Freq: Once | INTRAMUSCULAR | Status: AC
  Administered 2022-09-08: 4 mg via INTRAVENOUS
  Filled 2022-09-08: qty 2

## 2022-09-08 MED ORDER — MECLIZINE HCL 25 MG PO TABS
25.0000 mg | ORAL_TABLET | Freq: Once | ORAL | Status: AC
  Administered 2022-09-08: 25 mg via ORAL
  Filled 2022-09-08: qty 1

## 2022-09-08 NOTE — ED Notes (Signed)
PT became nauseated and had 1 small emesis episode.

## 2022-09-08 NOTE — ED Notes (Signed)
ED Provider at bedside. 

## 2022-09-08 NOTE — ED Notes (Signed)
Assumed care of pt at this time. Pt is AAXO4, place on CCM, VS stable and WNL. Pt c/o dizziness, denies pain. Endorses some mild nausea. No needs identified at this time. Pt accompanied by family member

## 2022-09-08 NOTE — ED Triage Notes (Signed)
EMS brings pt in from home for c/o dizziness; hx CVA with left side deficit

## 2022-09-08 NOTE — ED Provider Notes (Signed)
[facility] Provider Note    Event Date/Time   First MD Initiated Contact with Patient 09/08/22 2303     (approximate)   History   Dizziness   HPI  Susan Farrell is a 77 y.o. 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  Was otherwise in her regular state of health with no recent illnesses.  Continues to feel markedly dizzy, unable to walk or stand, and continues to feel nauseated.  After several episodes of emesis, reports neck soreness.  She reports no new changes in motor or sensory since her prior stroke left her with left-sided motor deficits greatest in the lower extremity, walks with cane at baseline.  She reports no pain, denying headache or abdominal pain.  Independent Historian contributed to assessment above: Her husband is at bedside corroborate information given above  External Medical Documents Reviewed: 2023 hospitalisation dc summary w L motor weakness found to have R basal ganglia stroke, dc'd on DAPT for 3 weeks followed by asa monotherapy      Physical Exam   Triage Vital Signs: ED Triage Vitals  Encounter Vitals Group     BP 09/08/22 2104 130/79     Systolic BP Percentile --      Diastolic BP Percentile --      Pulse Rate 09/08/22 2104 69     Resp 09/08/22 2104 18     Temp 09/08/22 2104 97.8 F (36.6 C)     Temp Source 09/08/22 2104 Oral     SpO2 09/08/22 2055 98 %     Weight 09/08/22 2100 170 lb (77.1 kg)     Height 09/08/22 2100 5\' 6"  (1.676 m)     Head Circumference --      Peak Flow --      Pain Score 09/08/22 2100 9     Pain Loc --      Pain Education --      Exclude from Growth Chart --     Most recent vital signs: Vitals:   09/09/22 0039 09/09/22 0041  BP: (!) 159/72    Pulse:  79  Resp:  19  Temp:    SpO2:      General: Awake, no distress.  CV:  Good peripheral perfusion.  Resp:  Normal effort.  Abd:  No distention.  Other:  Awake alert with high blood pressure otherwise vital signs normal.  She has left lower extremity weakness, unchanged from prior stroke per patient and husband.  She has no obvious facial asymmetry or dysarthria.  Extraocular movements are intact without nystagmus.  Pupils equal and reactive, midrange.  Upon standing she feels markedly dizzy and sits right away.  She has dysmetria on finger-to-nose   ED Results / Procedures / Treatments   Labs (all labs ordered are listed, but only abnormal results are displayed) Labs Reviewed  BASIC METABOLIC PANEL - Abnormal; Notable for the following components:      Result Value   Glucose, Bld 138 (*)    All other components within normal limits  CBC - Abnormal; Notable for the following components:   Hemoglobin 10.1 (*)    HCT 32.7 (*)    MCV 77.9 (*)    MCH 24.0 (*)    RDW 17.1 (*)    All other components within  normal limits  T4, FREE - Abnormal; Notable for the following components:   Free T4 1.24 (*)    All other components within normal limits  TSH  TROPONIN I (HIGH SENSITIVITY)  TROPONIN I (HIGH SENSITIVITY)     I ordered and reviewed the above labs they are notable for blood glucose is 138  EKG  ED ECG REPORT I, Pilar Jarvis, the attending physician, personally viewed and interpreted this ECG.   Date: 09/08/2022  EKG Time: 2113  Rate: 70  Rhythm: nsr  Axis: nl  Intervals:none  ST&T Change: TWI inferolateral leads, present in 2023 ECG     RADIOLOGY I independently reviewed and interpreted MRI of the brain see no obvious bleeding or midline shift I also reviewed radiologist's formal read.   PROCEDURES:  Critical Care performed: No  Procedures   MEDICATIONS ORDERED IN ED: Medications  ondansetron (ZOFRAN) injection 4 mg (4 mg Intravenous Given 09/08/22  2348)  sodium chloride 0.9 % bolus 500 mL (500 mLs Intravenous New Bag/Given 09/09/22 0040)  meclizine (ANTIVERT) tablet 25 mg (25 mg Oral Given 09/08/22 2348)  iohexol (OMNIPAQUE) 350 MG/ML injection 75 mL (75 mLs Intravenous Contrast Given 09/09/22 0025)    External physician / consultants:  I spoke with hospitalist for admission and regarding care plan for this patient.   IMPRESSION / MDM / ASSESSMENT AND PLAN / ED COURSE  I reviewed the triage vital signs and the nursing notes.                                Patient's presentation is most consistent with acute presentation with potential threat to life or bodily function.  Differential diagnosis includes, but is not limited to, peripheral versus central vertigo, CVA, intra-abdominal infection, dysrhythmia, ACS   The patient is on the cardiac monitor to evaluate for evidence of arrhythmia and/or significant heart rate changes.  MDM:    Patient with acute onset dizziness and nausea and vomiting history of stroke concern for central cause, outside of the stroke window for thrombolytic, proceed with CT angiogram and MRI of the brain.  Symptomatic treatment with fluids, meclizine, antiemetic. -- MRI with findings showing cerebellar stroke.  Admission.        FINAL CLINICAL IMPRESSION(S) / ED DIAGNOSES   Final diagnoses:  Cerebellar stroke (HCC)     Rx / DC Orders   ED Discharge Orders     None        Note:  This document was prepared using Dragon voice recognition software and may include unintentional dictation errors.    Pilar Jarvis, MD 09/09/22 [id number]

## 2022-09-08 NOTE — ED Notes (Signed)
Assisted pt to bedside toilet. Pt very unsteady. Pt assisted back into bed safely after voiding.

## 2022-09-08 NOTE — ED Triage Notes (Signed)
Pt brought in via ems from home with dizziness for 1 hour.  Pt reports vomiting x 3 tonight.  No chest pain or sob.  Pt alert   speech clear.

## 2022-09-08 NOTE — ED Notes (Signed)
Pt to MRI

## 2022-09-09 ENCOUNTER — Emergency Department: Payer: Medicare HMO

## 2022-09-09 ENCOUNTER — Encounter: Payer: Self-pay | Admitting: Internal Medicine

## 2022-09-09 DIAGNOSIS — Z720 Tobacco use: Secondary | ICD-10-CM | POA: Insufficient documentation

## 2022-09-09 DIAGNOSIS — L930 Discoid lupus erythematosus: Secondary | ICD-10-CM | POA: Insufficient documentation

## 2022-09-09 DIAGNOSIS — D638 Anemia in other chronic diseases classified elsewhere: Secondary | ICD-10-CM | POA: Insufficient documentation

## 2022-09-09 DIAGNOSIS — I69354 Hemiplegia and hemiparesis following cerebral infarction affecting left non-dominant side: Secondary | ICD-10-CM | POA: Diagnosis not present

## 2022-09-09 DIAGNOSIS — Z6827 Body mass index (BMI) 27.0-27.9, adult: Secondary | ICD-10-CM | POA: Diagnosis not present

## 2022-09-09 DIAGNOSIS — Z1152 Encounter for screening for COVID-19: Secondary | ICD-10-CM | POA: Diagnosis not present

## 2022-09-09 DIAGNOSIS — E038 Other specified hypothyroidism: Secondary | ICD-10-CM

## 2022-09-09 DIAGNOSIS — Z8249 Family history of ischemic heart disease and other diseases of the circulatory system: Secondary | ICD-10-CM | POA: Diagnosis not present

## 2022-09-09 DIAGNOSIS — Z79890 Hormone replacement therapy (postmenopausal): Secondary | ICD-10-CM | POA: Diagnosis not present

## 2022-09-09 DIAGNOSIS — E785 Hyperlipidemia, unspecified: Secondary | ICD-10-CM | POA: Diagnosis present

## 2022-09-09 DIAGNOSIS — N390 Urinary tract infection, site not specified: Secondary | ICD-10-CM | POA: Diagnosis present

## 2022-09-09 DIAGNOSIS — E039 Hypothyroidism, unspecified: Secondary | ICD-10-CM

## 2022-09-09 DIAGNOSIS — Z79899 Other long term (current) drug therapy: Secondary | ICD-10-CM | POA: Diagnosis not present

## 2022-09-09 DIAGNOSIS — F17210 Nicotine dependence, cigarettes, uncomplicated: Secondary | ICD-10-CM | POA: Diagnosis present

## 2022-09-09 DIAGNOSIS — I10 Essential (primary) hypertension: Secondary | ICD-10-CM | POA: Insufficient documentation

## 2022-09-09 DIAGNOSIS — M7989 Other specified soft tissue disorders: Secondary | ICD-10-CM | POA: Diagnosis present

## 2022-09-09 DIAGNOSIS — Z7982 Long term (current) use of aspirin: Secondary | ICD-10-CM | POA: Diagnosis not present

## 2022-09-09 DIAGNOSIS — E041 Nontoxic single thyroid nodule: Secondary | ICD-10-CM | POA: Diagnosis present

## 2022-09-09 DIAGNOSIS — I6389 Other cerebral infarction: Secondary | ICD-10-CM | POA: Diagnosis not present

## 2022-09-09 DIAGNOSIS — E663 Overweight: Secondary | ICD-10-CM | POA: Diagnosis present

## 2022-09-09 DIAGNOSIS — K5900 Constipation, unspecified: Secondary | ICD-10-CM | POA: Diagnosis present

## 2022-09-09 DIAGNOSIS — R279 Unspecified lack of coordination: Secondary | ICD-10-CM | POA: Diagnosis present

## 2022-09-09 DIAGNOSIS — I639 Cerebral infarction, unspecified: Secondary | ICD-10-CM | POA: Diagnosis present

## 2022-09-09 DIAGNOSIS — G8191 Hemiplegia, unspecified affecting right dominant side: Secondary | ICD-10-CM | POA: Diagnosis present

## 2022-09-09 DIAGNOSIS — I63541 Cerebral infarction due to unspecified occlusion or stenosis of right cerebellar artery: Secondary | ICD-10-CM | POA: Diagnosis present

## 2022-09-09 DIAGNOSIS — Z833 Family history of diabetes mellitus: Secondary | ICD-10-CM | POA: Diagnosis not present

## 2022-09-09 DIAGNOSIS — I63549 Cerebral infarction due to unspecified occlusion or stenosis of unspecified cerebellar artery: Secondary | ICD-10-CM | POA: Diagnosis not present

## 2022-09-09 DIAGNOSIS — R112 Nausea with vomiting, unspecified: Secondary | ICD-10-CM | POA: Diagnosis present

## 2022-09-09 DIAGNOSIS — Z9889 Other specified postprocedural states: Secondary | ICD-10-CM

## 2022-09-09 DIAGNOSIS — Z8679 Personal history of other diseases of the circulatory system: Secondary | ICD-10-CM | POA: Diagnosis not present

## 2022-09-09 LAB — T4, FREE: Free T4: 1.24 ng/dL — ABNORMAL HIGH (ref 0.61–1.12)

## 2022-09-09 LAB — CBC
HCT: 31.1 % — ABNORMAL LOW (ref 36.0–46.0)
Hemoglobin: 9.7 g/dL — ABNORMAL LOW (ref 12.0–15.0)
MCH: 24 pg — ABNORMAL LOW (ref 26.0–34.0)
MCHC: 31.2 g/dL (ref 30.0–36.0)
MCV: 77 fL — ABNORMAL LOW (ref 80.0–100.0)
Platelets: 221 10*3/uL (ref 150–400)
RBC: 4.04 MIL/uL (ref 3.87–5.11)
RDW: 16.9 % — ABNORMAL HIGH (ref 11.5–15.5)
WBC: 6.9 10*3/uL (ref 4.0–10.5)
nRBC: 0 % (ref 0.0–0.2)

## 2022-09-09 LAB — LIPID PANEL
Cholesterol: 97 mg/dL (ref 0–200)
HDL: 36 mg/dL — ABNORMAL LOW (ref >40)
LDL Cholesterol: 52 mg/dL (ref 0–99)
Total CHOL/HDL Ratio: 2.7 RATIO
Triglycerides: 44 mg/dL (ref <150)
VLDL: 9 mg/dL (ref 0–40)

## 2022-09-09 LAB — TROPONIN I (HIGH SENSITIVITY): Troponin I (High Sensitivity): 10 ng/L (ref <18)

## 2022-09-09 LAB — CREATININE, SERUM
Creatinine, Ser: 0.83 mg/dL (ref 0.44–1.00)
GFR, Estimated: >60 mL/min (ref >60)

## 2022-09-09 LAB — TSH: TSH: 0.448 u[IU]/mL (ref 0.350–4.500)

## 2022-09-09 LAB — HEMOGLOBIN A1C
Hgb A1c MFr Bld: 6.3 % — ABNORMAL HIGH (ref 4.8–5.6)
Mean Plasma Glucose: 134.11 mg/dL

## 2022-09-09 MED ORDER — STROKE: EARLY STAGES OF RECOVERY BOOK
Freq: Once | Status: AC
  Administered 2022-09-09: 1

## 2022-09-09 MED ORDER — ASPIRIN 81 MG PO TBEC
81.0000 mg | DELAYED_RELEASE_TABLET | Freq: Every day | ORAL | Status: DC
  Administered 2022-09-10 – 2022-09-12 (×3): 81 mg via ORAL
  Filled 2022-09-09 (×3): qty 1

## 2022-09-09 MED ORDER — SODIUM CHLORIDE 0.9 % IV SOLN: INTRAVENOUS | Status: DC

## 2022-09-09 MED ORDER — OYSTER SHELL CALCIUM/D3 500-5 MG-MCG PO TABS
1.0000 | ORAL_TABLET | Freq: Every day | ORAL | Status: DC
  Administered 2022-09-10 – 2022-09-12 (×3): 1 via ORAL
  Filled 2022-09-09 (×3): qty 1

## 2022-09-09 MED ORDER — CLOPIDOGREL BISULFATE 75 MG PO TABS: 75.0000 mg | ORAL_TABLET | Freq: Every day | ORAL | Status: DC

## 2022-09-09 MED ORDER — ASPIRIN 325 MG PO TABS
325.0000 mg | ORAL_TABLET | Freq: Every day | ORAL | Status: DC
  Administered 2022-09-09: 325 mg via ORAL
  Filled 2022-09-09: qty 1

## 2022-09-09 MED ORDER — LEVOTHYROXINE SODIUM 100 MCG PO TABS
100.0000 ug | ORAL_TABLET | Freq: Every day | ORAL | Status: DC
  Administered 2022-09-09 – 2022-09-12 (×4): 100 ug via ORAL
  Filled 2022-09-09 (×4): qty 1

## 2022-09-09 MED ORDER — ACETAMINOPHEN 160 MG/5ML PO SOLN: 650.0000 mg | ORAL | Status: DC | PRN

## 2022-09-09 MED ORDER — CLOPIDOGREL BISULFATE 75 MG PO TABS
300.0000 mg | ORAL_TABLET | Freq: Once | ORAL | Status: AC
  Administered 2022-09-09: 300 mg via ORAL
  Filled 2022-09-09: qty 4

## 2022-09-09 MED ORDER — DOCUSATE SODIUM 100 MG PO CAPS
100.0000 mg | ORAL_CAPSULE | Freq: Every day | ORAL | Status: DC
  Administered 2022-09-09 – 2022-09-12 (×4): 100 mg via ORAL
  Filled 2022-09-09 (×4): qty 1

## 2022-09-09 MED ORDER — IOHEXOL 350 MG/ML SOLN
75.0000 mL | Freq: Once | INTRAVENOUS | Status: AC | PRN
  Administered 2022-09-09: 75 mL via INTRAVENOUS

## 2022-09-09 MED ORDER — CLOPIDOGREL BISULFATE 75 MG PO TABS
75.0000 mg | ORAL_TABLET | Freq: Every day | ORAL | Status: DC
  Administered 2022-09-10 – 2022-09-12 (×3): 75 mg via ORAL
  Filled 2022-09-09 (×3): qty 1

## 2022-09-09 MED ORDER — ONDANSETRON HCL 4 MG/2ML IJ SOLN
4.0000 mg | Freq: Four times a day (QID) | INTRAMUSCULAR | Status: DC | PRN
  Administered 2022-09-09: 4 mg via INTRAVENOUS
  Filled 2022-09-09: qty 2

## 2022-09-09 MED ORDER — ADULT MULTIVITAMIN W/MINERALS CH
1.0000 | ORAL_TABLET | Freq: Every day | ORAL | Status: DC
  Administered 2022-09-10 – 2022-09-12 (×3): 1 via ORAL
  Filled 2022-09-09 (×3): qty 1

## 2022-09-09 MED ORDER — ACETAMINOPHEN 650 MG RE SUPP: 650.0000 mg | RECTAL | Status: DC | PRN

## 2022-09-09 MED ORDER — ACETAMINOPHEN 325 MG PO TABS
650.0000 mg | ORAL_TABLET | ORAL | Status: DC | PRN
  Administered 2022-09-09: 650 mg via ORAL
  Filled 2022-09-09: qty 2

## 2022-09-09 MED ORDER — VARENICLINE TARTRATE 0.5 MG PO TABS
1.0000 mg | ORAL_TABLET | Freq: Two times a day (BID) | ORAL | Status: DC
  Administered 2022-09-09 – 2022-09-12 (×6): 1 mg via ORAL
  Filled 2022-09-09 (×7): qty 2

## 2022-09-09 MED ORDER — POLYSACCHARIDE IRON COMPLEX 150 MG PO CAPS
150.0000 mg | ORAL_CAPSULE | Freq: Every day | ORAL | Status: DC
  Administered 2022-09-10 – 2022-09-12 (×3): 150 mg via ORAL
  Filled 2022-09-09 (×4): qty 1

## 2022-09-09 MED ORDER — ASPIRIN 300 MG RE SUPP: 300.0000 mg | Freq: Every day | RECTAL | Status: DC

## 2022-09-09 MED ORDER — ROSUVASTATIN CALCIUM 10 MG PO TABS
40.0000 mg | ORAL_TABLET | Freq: Every day | ORAL | Status: DC
  Administered 2022-09-09 – 2022-09-12 (×4): 40 mg via ORAL
  Filled 2022-09-09 (×2): qty 4
  Filled 2022-09-09 (×2): qty 2
  Filled 2022-09-09: qty 4

## 2022-09-09 MED ORDER — ENOXAPARIN SODIUM 40 MG/0.4ML IJ SOSY
40.0000 mg | PREFILLED_SYRINGE | INTRAMUSCULAR | Status: DC
  Administered 2022-09-09 – 2022-09-12 (×4): 40 mg via SUBCUTANEOUS
  Filled 2022-09-09 (×4): qty 0.4

## 2022-09-09 NOTE — Progress Notes (Signed)
? ?  Inpatient Rehab Admissions Coordinator : ? ?Per therapy recommendations, patient was screened for CIR candidacy by Barbara Boyette RN MSN.  At this time patient appears to be a potential candidate for CIR. I will place a rehab consult per protocol for full assessment. Please call me with any questions. ? ?Barbara Boyette RN MSN ?Admissions Coordinator ?[phone] ?  ?

## 2022-09-09 NOTE — Evaluation (Signed)
Occupational Therapy Evaluation Patient Details Name: Susan Farrell MRN: [id number] DOB: 1945/03/24 Today's Date: 09/09/2022   History of Present Illness Susan Farrell is a 76yoF who comes to [facility] on 8/19 after acute onset N/V and dizziness. 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   Clinical Impression   Patient received for OT evaluation. See flowsheet below for details of function. Generally, patient requiring SBA/supervision for bed mobility, MOD A with RW for one sidestep at EOB, and set up-MOD A for ADLs. Pt is dizzy throughout session, worse with sitting and standing; reports blurry vision that is new. Patient will benefit from continued OT while in acute care.        If plan is discharge home, recommend the following: Two people to help with walking and/or transfers;A lot of help with bathing/dressing/bathroom;Assistance with cooking/housework;Direct supervision/assist for medications management;Direct supervision/assist for financial management;Assist for transportation;Help with stairs or ramp for entrance    Functional Status Assessment  Patient has had a recent decline in their functional status and demonstrates the ability to make significant improvements in function in a reasonable and predictable amount of time.  Equipment Recommendations  Other (comment) (defer to next venue of care)    Recommendations for Other Services       Precautions / Restrictions Precautions Precautions: Fall Restrictions Weight Bearing Restrictions: No      Mobility Bed Mobility Overal bed mobility: Needs Assistance Bed Mobility: Supine to Sit, Sit to Supine     Supine to sit: Supervision Sit to supine: Supervision   General bed mobility comments: Off balance due to dizziness, but not needing any physical assist    Transfers Overall transfer  level: Needs assistance Equipment used: Rolling walker (2 wheels) Transfers: Sit to/from Stand Sit to Stand: Min assist           General transfer comment: Needing MIN A for sit to stand t/f; posterior lean upon first standing and needing MIN-MOD for standing balance; eventually better balanced, but still needing MIN A for safety. One sidestep to the R towards [facility] with MOD A for balance using RW.      Balance Overall balance assessment: Needs assistance Sitting-balance support: Feet supported, Bilateral upper extremity supported Sitting balance-Leahy Scale: Fair     Standing balance support: Bilateral upper extremity supported, Reliant on assistive device for balance Standing balance-Leahy Scale: Poor Standing balance comment: with RW                           ADL either performed or assessed with clinical judgement   ADL Overall ADL's : Needs assistance/impaired   Eating/Feeding Details (indicate cue type and reason): anticipate supervision, bed level only   Grooming Details (indicate cue type and reason): anticipate set up-MIN A, bed level only (needs back support)   Upper Body Bathing Details (indicate cue type and reason): anticipate MIN A bed level   Lower Body Bathing Details (indicate cue type and reason): anticipate MAX A bed level with back support   Upper Body Dressing Details (indicate cue type and reason): anticipate MIN A seated (balance deficit)   Lower Body Dressing Details (indicate cue type and reason): anticipate MAX A due to standing balance deficit (pt able to make nearly figure four, but not quite able to get BIL LE over opposite knee; particular difficulty with LLE given baseline weakenss  and need for BIL UE to assist with moving LE, meaning UE not available to assist with sitting balance).   Toilet Transfer Details (indicate cue type and reason): not tested today for safety; would require +2 assist likely for safety, as pt taking one sidestep  at EOB was very off-balance and needing MOD A and RW to maintain balance, with posterior lean.   Toileting - Clothing Manipulation Details (indicate cue type and reason): anticipate dependent while standing/bed level   Tub/Shower Transfer Details (indicate cue type and reason): not yet safe for tub transfer given dizziness/decreased balance Functional mobility during ADLs: Moderate assistance;Rolling walker (2 wheels) (Only able to take one sidestep with RLE towards HOB; very off-balance, posterior lean.) General ADL Comments: Pt BIL UE strength/ROM WNL; however, pt having blurring of vision and decreased sitting balance (mild) and decreased standing balance (severe), so high risk for LB and standing ADL deficits at this time. Pt feels dizzy even in supine, which worsens with sitting and worsens further with standing.     Vision Baseline Vision/History: 0 No visual deficits (Pt reports she does not wear glasses for reading; typically is able to read regular print.) Ability to See in Adequate Light: 1 Impaired Patient Visual Report: Blurring of vision Vision Assessment?: Yes Tracking/Visual Pursuits: Impaired - to be further tested in functional context;Other (comment) (Pt is able to track in all quadrants, but sometimes eyes will jump to midline) Additional Comments: Pt reporting vision is blurry. Unable to read information on OT's clipboard that is in size approx 11 font.     Perception         Praxis         Pertinent Vitals/Pain Pain Assessment Pain Assessment: No/denies pain     Extremity/Trunk Assessment Upper Extremity Assessment Upper Extremity Assessment: Overall WFL for tasks assessed;Right hand dominant   Lower Extremity Assessment Lower Extremity Assessment: Defer to PT evaluation (Pt has baseline RLE deficits from prior CVA)   Cervical / Trunk Assessment Cervical / Trunk Assessment: Normal   Communication Communication Communication: No apparent difficulties    Cognition Arousal: Lethargic, Suspect due to medications Behavior During Therapy: WFL for tasks assessed/performed Overall Cognitive Status: Within Functional Limits for tasks assessed                                 General Comments: A+Ox4. Agreeable, quiet.     General Comments  Pt on room air; appearing fatigued. Husband at bedside, polite and not saying much during session.    Exercises     Shoulder Instructions      Home Living Family/patient expects to be discharged to:: Private residence Living Arrangements: Spouse/significant other Available Help at Discharge: Family Type of Home: House Home Access: Stairs to enter Secretary/administrator of Steps: 2-3 Entrance Stairs-Rails: None Home Layout: One level     Bathroom Shower/Tub: Producer, television/film/video: Handicapped height     Home Equipment: Shower seat - built Charity fundraiser (2 wheels);Cane - quad          Prior Functioning/Environment Prior Level of Function : Independent/Modified Independent;Driving             Mobility Comments: Pt using cane for mobility at baseline. ADLs Comments: Pt reports (I) ADLs and most IADLs. Husband does laundry. Pt drives (husband also drives if needed). (I) medication management. Enjoys church activities. Retired from [employer] lab.  OT Problem List: Decreased strength;Decreased activity tolerance;Impaired balance (sitting and/or standing);Impaired vision/perception      OT Treatment/Interventions: Self-care/ADL training;Therapeutic exercise;Therapeutic activities;Visual/perceptual remediation/compensation;Patient/family education;DME and/or AE instruction    OT Goals(Current goals can be found in the care plan section) Acute Rehab OT Goals Patient Stated Goal: Get better and back to normal OT Goal Formulation: With patient Time For Goal Achievement: 09/23/22 Potential to Achieve Goals: Good ADL Goals Pt Will Perform Grooming: with modified  independence;standing Pt Will Perform Lower Body Bathing: with modified independence;sit to/from stand Pt Will Perform Lower Body Dressing: with modified independence;sit to/from stand Pt Will Transfer to Toilet: with modified independence;bedside commode Pt Will Perform Toileting - Clothing Manipulation and hygiene: with modified independence;sit to/from stand  OT Frequency: Min 1X/week    Co-evaluation              AM-PAC OT "6 Clicks" Daily Activity     Outcome Measure Help from another person eating meals?: A Little Help from another person taking care of personal grooming?: A Little Help from another person toileting, which includes using toliet, bedpan, or urinal?: A Lot Help from another person bathing (including washing, rinsing, drying)?: A Lot Help from another person to put on and taking off regular upper body clothing?: A Little Help from another person to put on and taking off regular lower body clothing?: A Lot 6 Click Score: 15   End of Session Equipment Utilized During Treatment: Rolling walker (2 wheels);Gait belt Nurse Communication: Mobility status  Activity Tolerance: Patient limited by fatigue;Patient tolerated treatment well Patient left: in bed;with call bell/phone within reach;with bed alarm set;with family/visitor present  OT Visit Diagnosis: Unsteadiness on feet (R26.81);Dizziness and giddiness (R42);Other (comment) (blurry vision)                Time: 3329-5188 OT Time Calculation (min): 16 min Charges:  OT General Charges $OT Visit: 1 Visit OT Evaluation $OT Eval Moderate Complexity: 1 Mod  Diaz Crago Junie Panning, MS, OTR/L  Alvester Morin 09/09/2022, 11:32 AM

## 2022-09-09 NOTE — Assessment & Plan Note (Deleted)
At baseline 

## 2022-09-09 NOTE — ED Notes (Signed)
ED Provider at bedside. 

## 2022-09-09 NOTE — Hospital Course (Signed)
77 y.o. 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  Onset was at 5:30 PM on 09/08/2022.  She had associated nausea and went on to have 3 episodes of nonbloody nonbilious vomiting.  Since the onset she has continued to feel dizzy and whereas previously she could stand with the assistance of her cane she is unable to maintain her balance when standing and has to remain seated.  She was previously in her usual state of health.  She denies headache or visual disturbance.  She has no diarrhea, dysuria, abdominal pain, fever or chills.  Patient arrived to the ED at 8:55 PM however was not evaluated until later on around 11 PM when concern arose for possible CVA.   8/20.  MRI showing right superior cerebellar stroke.  CT angio showing possibility of a right ICA aneurysm.  Physical therapy recommending rehab.

## 2022-09-09 NOTE — ED Notes (Signed)
 Provided urine incontinence care. Replaced bed linen, bed pads, pt gown, and blankets. Pt repositioned, side rails up x2,  and call light placed back within reach. No further needs identified at this time.

## 2022-09-09 NOTE — Consult Note (Signed)
Neurology Consultation Reason for Consult: Stroke on MRI Requesting Physician: Fidela Juneau   CC: Acute onset dizziness, nausea and vomitting  History is obtained from: Patient, chart and husband at bedside  HPI: Susan Farrell is a 77 y.o. Right-handed woman with a past medical history significant for right basal ganglia stroke (08/2021, with residual left leg weakness), hypertension, hyperlipidemia, hypothyroidism, ongoing smoking (3 cigarettes/day), discoid lupus on topical medications only  She reports she was in her usual state of health, eating dinner when she suddenly began to have nausea and vomiting.  Thought it was a vertigo but workup in the ED revealed cerebellar stroke for which neurology was consulted  Regarding her smoking history she notes she has cut back from 5 to 6 cigars a day to 3 cigarettes a day.  She reports patches do not stay on her skin well and she does not like how they feel and she does not like the gum because it sticks to her dentures.  However she reports she previously had a good experience with Chantix and is willing to try this again  She notes she was treated for UTI last week and received antibiotics for this, with improvement in her symptoms.  Otherwise no ongoing hematuria, no blood in her stool, no blood in her vomit, no fevers or infectious symptoms or other focal neurological symptoms recently  LKW: 5 PM on 8/19 Thrombolytic given?: No, out of the window and aortic abdominal aneursym IA performed?: No, no LVO Premorbid modified rankin scale:      2 - Slight disability. Able to look after own affairs without assistance, but unable to carry out all previous activities (walks with a cane but without support of another person)    ROS: All other review of systems was negative except as noted in the HPI.   Past Medical History:  Diagnosis Date   Hypertension    Stroke [facility])    Past Surgical History:  Procedure Laterality Date   ABDOMINAL  HYSTERECTOMY     CHOLECYSTECTOMY     ENDOVASCULAR REPAIR/STENT GRAFT N/A 06/12/2020   Procedure: ENDOVASCULAR REPAIR/STENT GRAFT;  Surgeon: Renford Dills, MD;  Location: [facility] INVASIVE CV LAB;  Service: Cardiovascular;  Laterality: N/A;   Current Outpatient Medications  Medication Instructions   amLODipine (NORVASC) 2.5 mg, Daily   amLODipine (NORVASC) 5 mg, Oral, Daily   aspirin EC 81 mg, Oral, Daily, Swallow whole.   Calcium Carbonate-Vitamin D 600-200 MG-UNIT TABS 1 tablet, Oral, Daily   clobetasol cream (TEMOVATE) 0.05 % Topical, 2 times daily PRN   docusate sodium (COLACE) 100 mg, Oral, Daily   halobetasol (ULTRAVATE) 0.05 % ointment 2 times daily   iron polysaccharides (FERREX 150) 150 MG capsule 1 capsule, Oral, Daily   meclizine (ANTIVERT) 25 mg, Oral, 3 times daily PRN   metoprolol succinate (TOPROL-XL) 50 mg, Oral, Daily   Multiple Vitamins-Minerals (WOMENS MULTIVITAMIN + COLLAGEN PO) 1 tablet, Oral, Daily   rosuvastatin (CRESTOR) 40 mg, Oral, Daily   Synthroid 100 mcg, Oral, Daily   tiZANidine (ZANAFLEX) 2 mg, 3 times daily   Family History  Problem Relation Age of Onset   Hypertension Mother    Hypertension Father    Diabetes Sister    Breast cancer Neg Hx     Social History:  reports that she has been smoking cigarettes. She uses smokeless tobacco. She reports that she does not currently use alcohol. She reports that she does not use drugs.  Exam: Current vital signs: BP Marland Kitchen)  154/65   Pulse 66   Temp 97.8 F (36.6 C) (Oral)   Resp 16   Ht 5\' 6"  (1.676 m)   Wt 77.1 kg   SpO2 100%   BMI 27.44 kg/m  Vital signs in last 24 hours: Temp:  [97.8 F (36.6 C)-98.3 F (36.8 C)] 97.8 F (36.6 C) (08/20 1350) Pulse Rate:  [56-86] 66 (08/20 1330) Resp:  [12-26] 16 (08/20 1330) BP: (130-159)/(65-88) 154/65 (08/20 1330) SpO2:  [97 %-100 %] 100 % (08/20 1330) Weight:  [77.1 kg] 77.1 kg (08/19 2100)   Physical Exam  Constitutional: Appears well-developed and  well-nourished.  Psych: Affect appropriate to situation, pleasant and cooperative Eyes: No scleral injection HENT: No oropharyngeal obstruction.  MSK: no major joint deformities.  Cardiovascular: Normal rate and regular rhythm. Perfusing extremities well Respiratory: Effort normal, non-labored breathing GI: Soft.  No distension. There is no tenderness.  Skin: Discoid lupus lesions   Neuro: Mental Status: Patient is awake, alert, oriented to person, place, month, year, and situation. Patient is able to give a clear and coherent history. No signs of aphasia or neglect Cranial Nerves: II: Visual Fields are full. Pupils are equal, round, and reactive to light, except that the left pupil is just slightly larger than the right (approximately 0.5 mm) III,IV, VI: EOMI without ptosis or diploplia.  She reports mild blurring of her vision but there is no nystagmus or frank diplopia V: Facial sensation is symmetric to light touch VII: Facial movement is symmetric.  VIII: hearing is intact to voice and tuning fork X: Uvula elevates symmetrically XI: Shoulder shrug is symmetric. XII: tongue is midline without atrophy or fasciculations.  Motor: 5/5 strength throughout the right side.   Left upper extremity 4/5 in an upper motor neuron pattern. Mild pronation w/o drift.   Left lower extremity 2/5 hip flexion and knee flexion, 4/5 foot dorsiflexion, 5/5 knee extension and foot plantarflexion Sensory: Sensation is reduced on the left leg (chronic) Deep Tendon Reflexes: 3+ in the brachioradialis and patellar bilaterally but it is more brisk on the left than the right. Cerebellar: Ataxia in the right upper extremity.  No ataxia on the left when allowing for weakness Gait:  Deferred for safety  NIHSS total 3  Score breakdown: (1 for RUE ataxia (new); 2 for LLE drift and 1 for LLE sensory loss (chronic)) Performed at 12:30 PM   I have reviewed labs in epic and the results pertinent to this  consultation are:  Basic Metabolic Panel: Recent Labs  Lab 09/08/22 2102 09/09/22 0202  NA 138  --   K 4.2  --   CL 104  --   CO2 25  --   GLUCOSE 138*  --   BUN 14  --   CREATININE 0.85 0.83  CALCIUM 9.1  --     CBC: Recent Labs  Lab 09/08/22 2102 09/09/22 0202  WBC 8.1 6.9  HGB 10.1* 9.7*  HCT 32.7* 31.1*  MCV 77.9* 77.0*  PLT 269 221   Coagulation Studies: No results for input(s): "LABPROT", "INR" in the last 72 hours.   Lab Results  Component Value Date   CHOL 97 09/09/2022   HDL 36 (L) 09/09/2022   LDLCALC 52 09/09/2022   TRIG 44 09/09/2022   CHOLHDL 2.7 09/09/2022    Lab Results  Component Value Date   HGBA1C 6.3 (H) 09/09/2022     I have reviewed the images obtained:  MRI personally reviewed, agree with radiology:   Acute infarct in  the right superior cerebellum and vermis, which correlates with the right superior cerebellar artery territory. No evidence of hemorrhagic transformation.   CTA  head and neck personally reviewed, agree with radiology:   1. Hypodensity in the superior right cerebellum and vermis, consistent with the acute right SCA territory infarct seen on the same-day MRI. 2. Decreased caliber of proximal right SCA compared to 08/28/2021, although the vessel does appear patent proximally. 3. No intracranial large vessel occlusion. 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 4. No hemodynamically significant stenosis in the neck.   Abdominal Aorta: The EVAR appears to be Essentially unchanged in size  compared to prior study on06/06/2021. Prior study displayed a measurement  of 6.12 cm compared to 6.46 cm today.   Impression: Embolic appearing stroke, possibly atheroembolic vs. cardioembolic. Given cerebellar stroke, with edema (which peaks 3-5 days from symptom onset), does have risk of developing complications, and would monitor at least for the next 2 days inpatient.   Recommendations:  # Right superior cerebellar  artery stroke - Plavix 300 mg loading dose then 75 mg daily for 90 day course - Continue home rosuvastatin 40 mg, meeting LDL goal < 70 - Meeting A1c gaol < 7% - ECHO pending (w/o bubble given age) - Needs observation for at least 2 more days, q4 neuro checks, STAT head CT and activate code stroke for any change in neurological status including new severe headache ' - Telemetry; event monitor on discharge if no arrhythmia found (would need vascular surgery clearance for anticogaultion if arrhythmia is found, due to AAA) - Start gradually normalizing BP tomorrow 8/21 - PT/OT/SLP  # Smoking cesssation - Counseled on the importance of quitting smoking to reduce risk of stroke. Discussed that nicotine withdrawal symptoms peak in the first three days of smoking cessation and subside over the next three to four weeks. Discussed options including counseling, pharmacological options including Nicotine patch, Chantix, Wellbutrin. We discussed quit date.  - Patient declined patch (skin issues), gum (dentures) but reports she's done well with Chantix before; recommend starting Chantix  Discussed with primary team via secure chat and in person  Brooke Dare MD-PhD [facility] [phone]

## 2022-09-09 NOTE — ED Notes (Signed)
Pt vomited after eating fruit. MD notified, Zofran ordered. Pt resting in bed now.

## 2022-09-09 NOTE — Assessment & Plan Note (Signed)
No acute issues.

## 2022-09-09 NOTE — Evaluation (Signed)
Physical Therapy Evaluation Patient Details Name: Susan Farrell MRN: [id number] DOB: 1945/05/13 Today's Date: 09/09/2022  History of Present Illness  Susan Farrell is a 76yoF who comes to [facility] ED on 8/19 after acute onset N/V and dizziness. 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  Clinical Impression  Pt asleep on entry, remains drowsy but interactive and appropriate. Weakness without need for assistance other than supervision upon performing supine to/from sitting EOB. Pt has persistent dizziness symptoms throughout session, albeit reportedly better after getting meds for this. Pt does correlate some visual disturbance (diplopia, vertigo) with this dizziness when asked. Pt requires modA to rise to standing, but is steady with RW. Drowsiness and urinary incontinence preclude AMB assessment at this time. Pt shows significant difficulty with finger to nose and distal targets as well, accuracy off >1 inch bilaterally, with some tremulous features on LUE. Pt is far off her most recent baseline, where she regained quite a bit of function with rehab after her previous stroke. A rehab stay at [facility] would allow for safest eventual transition back to home and the most rapid rehabilitation of independence and function.       If plan is discharge home, recommend the following: A lot of help with walking and/or transfers;A lot of help with bathing/dressing/bathroom;Help with stairs or ramp for entrance   Can travel by private vehicle        Equipment Recommendations None recommended by PT  Recommendations for Other Services  Rehab consult    Functional Status Assessment       Precautions / Restrictions Precautions Precautions: Fall Restrictions Weight Bearing Restrictions: No      Mobility  Bed Mobility Overal bed mobility: Needs Assistance Bed Mobility: Supine  to Sit, Sit to Supine     Supine to sit: Supervision Sit to supine: Supervision        Transfers Overall transfer level: Needs assistance Equipment used: Rolling walker (2 wheels) Transfers: Sit to/from Stand Sit to Stand: Mod assist           General transfer comment: much weaker than baseline, but able to balance in standing with RW in place    Ambulation/Gait Ambulation/Gait assistance:  (deferred due to urinary incontinence on floor and continued lethargy.)                Stairs            Wheelchair Mobility     Tilt Bed    Modified Rankin (Stroke Patients Only)       Balance                                             Pertinent Vitals/Pain Pain Assessment Pain Assessment: No/denies pain    Home Living Family/patient expects to be discharged to:: Private residence Living Arrangements: Spouse/significant other Available Help at Discharge: Family Type of Home: House Home Access: Stairs to enter Entrance Stairs-Rails: None Entrance Stairs-Number of Steps: 2-3   Home Layout: One level Home Equipment: Shower seat - built [employer] fundraiser (2 wheels);Cane - quad      Prior Function Prior Level of Function : Independent/Modified Independent;Driving                     Extremity/Trunk Assessment  Communication      Cognition Arousal: Lethargic, Suspect due to medications Behavior During Therapy: WFL for tasks assessed/performed Overall Cognitive Status: Within Functional Limits for tasks assessed                                          General Comments      Exercises     Assessment/Plan    PT Assessment Patient needs continued PT services  PT Problem List Decreased range of motion;Decreased strength;Decreased activity tolerance;Decreased balance;Decreased mobility;Decreased coordination;Decreased knowledge of precautions;Decreased safety awareness       PT  Treatment Interventions DME instruction;Gait training;Stair training;Functional mobility training;Therapeutic activities;Therapeutic exercise;Balance training;Cognitive remediation;Patient/family education    PT Goals (Current goals can be found in the Care Plan section)  Acute Rehab PT Goals Patient Stated Goal: regain functional independence and activity tolerance PT Goal Formulation: With patient Time For Goal Achievement: 09/23/22 Potential to Achieve Goals: Good    Frequency Min 1X/week     Co-evaluation               AM-PAC PT "6 Clicks" Mobility  Outcome Measure Help needed turning from your back to your side while in a flat bed without using bedrails?: A Little Help needed moving from lying on your back to sitting on the side of a flat bed without using bedrails?: A Little Help needed moving to and from a bed to a chair (including a wheelchair)?: A Lot Help needed standing up from a chair using your arms (e.g., wheelchair or bedside chair)?: A Lot Help needed to walk in hospital room?: A Lot Help needed climbing 3-5 steps with a railing? : A Lot 6 Click Score: 14    End of Session   Activity Tolerance: Patient limited by fatigue;No increased pain Patient left: in bed;with call bell/phone within reach;with bed alarm set   PT Visit Diagnosis: Difficulty in walking, not elsewhere classified (R26.2);Other abnormalities of gait and mobility (R26.89);Unsteadiness on feet (R26.81);Dizziness and giddiness (R42)    Time: 7253-6644 PT Time Calculation (min) (ACUTE ONLY): 16 min   Charges:   PT Evaluation $PT Eval High Complexity: 1 High   PT General Charges $$ ACUTE PT VISIT: 1 Visit        10:16 AM, 09/09/22 [name], PT, DPT Physical Therapist - [facility]  [phone] (ASCOM)    Susan Farrell 09/09/2022, 10:12 AM

## 2022-09-09 NOTE — Assessment & Plan Note (Addendum)
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  Given aspirin.  Patient on Crestor 40 mg daily.  LDL 52.  PT recommending rehab (possible candidate for CIR)

## 2022-09-09 NOTE — Assessment & Plan Note (Signed)
Hold antihypertensives to allow for permissive hypertension 

## 2022-09-09 NOTE — Progress Notes (Signed)
Progress Note   Patient: Susan Farrell MWU:[id number] DOB: 02-19-1945 DOA: 09/08/2022     0 DOS: the patient was seen and examined on 09/09/2022   Brief hospital course: 77 y.o. 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  Onset was at 5:30 PM on 09/08/2022.  She had associated nausea and went on to have 3 episodes of nonbloody nonbilious vomiting.  Since the onset she has continued to feel dizzy and whereas previously she could stand with the assistance of her cane she is unable to maintain her balance when standing and has to remain seated.  She was previously in her usual state of health.  She denies headache or visual disturbance.  She has no diarrhea, dysuria, abdominal pain, fever or chills.  Patient arrived to the ED at 8:55 PM however was not evaluated until later on around 11 PM when concern arose for possible CVA.   8/20.  MRI showing right superior cerebellar stroke.  CT angio showing possibility of a right ICA aneurysm.  Physical therapy recommending rehab.  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  Given aspirin.  Patient on Crestor 40 mg daily.  LDL 52.  PT recommending rehab (possible candidate for CIR)   Hypothyroidism Continue levothyroxine  Thyroid nodule Will need outpatient thyroid ultrasound  Tobacco abuse On Chantix  Anemia of chronic disease Hemoglobin 9.7.  Discoid lupus No acute issues  HTN (hypertension) Hold antihypertensives to allow for permissive hypertension  History of abdominal aortic aneurysm (AAA)  repair No acute issues suspected        Subjective: Patient coming in with dizziness and generalized weakness.  She has a history of chronic left-sided weakness and now has right-sided weakness.  MRI showing a stroke right superior cerebellum.  Physical Exam: Vitals:   09/09/22 1230 09/09/22 1300 09/09/22 1330 09/09/22 1350  BP: (!) 153/83 (!) 153/74 (!) 154/65   Pulse: 65 63 66   Resp: 14 (!) 24 16   Temp:    97.8 F (36.6 C)  TempSrc:    Oral  SpO2: 100% 99% 100%   Weight:      Height:       Physical Exam HENT:     Head: Normocephalic.     Mouth/Throat:     Pharynx: No oropharyngeal exudate.  Eyes:     General: Lids are normal.     Conjunctiva/sclera: Conjunctivae normal.  Cardiovascular:     Rate and Rhythm: Normal rate and regular rhythm.     Heart sounds: Normal heart sounds, S1 normal and S2 normal.  Pulmonary:     Breath sounds: No decreased breath sounds, wheezing, rhonchi or rales.  Abdominal:     Palpations: Abdomen is soft.     Tenderness: There is no abdominal tenderness.  Musculoskeletal:     Right lower leg: Swelling present.     Left lower leg: Swelling present.  Skin:    General: Skin is warm.     Comments: Numerous skin lesions from discoid lupus  Neurological:     Mental Status: She is alert and oriented to person,  place, and time.     Comments: Power 4 out of 5 bilateral upper and lower extremities.  Incoordination with finger-nose right hand and heel shin right hand.  Able to straight leg raise better with right leg than left leg.     Data Reviewed: LDL 52, creatinine 0.83, hemoglobin 9.7 (ferritin last year 158) MRI showing acute infarct of the right superior cerebellum and vermis which correlates to the right superior cerebellar artery territory. 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  Family Communication: Spoke with husband at the  bedside  Disposition: Status is: Inpatient Remains inpatient appropriate because: With cerebellar stroke needs to be watched in the hospital couple more days and watch vital signs for any worsening of symptoms.  Planned Discharge Destination: Rehab, possible candidate for CIR.    Time spent: 28 minutes  Author: Alford Highland, MD 09/09/2022 2:25 PM  For on call review [email or web address].

## 2022-09-09 NOTE — Progress Notes (Signed)
SLP Cancellation Note  Patient Details Name: Susan Farrell MRN: [id number] DOB: 06-15-1945   Cancelled treatment:       Reason Eval/Treat Not Completed: SLP screened, no needs identified, will sign off (chart reviewed; consulted NSG then met w/ pt/Husband in room)  Pt resting but awakened appropriately given greeting. Pt and Husband denied (pt) having any difficulty swallowing; she is currently on a regular diet and tolerating swallowing pills w/ water per NSG.  Pt conversed in general conversation w/out overt expressive/receptive deficits noted; pt denied any speech-language deficits. Speech clear.  No further skilled ST services indicated as pt appears at her communication baseline. Pt agreed. NSG to reconsult if any change in status while admitted.      Jerilynn Som, MS, CCC-SLP Speech Language Pathologist Rehab Services; [facility] [phone] (ascom) Renatta Shrieves 09/09/2022, 3:27 PM

## 2022-09-09 NOTE — Assessment & Plan Note (Signed)
Will need outpatient thyroid ultrasound

## 2022-09-09 NOTE — Assessment & Plan Note (Signed)
Continue levothyroxine 

## 2022-09-09 NOTE — Assessment & Plan Note (Signed)
No acute issues suspected 

## 2022-09-09 NOTE — Assessment & Plan Note (Signed)
 Hemoglobin 9.7.

## 2022-09-09 NOTE — H&P (Signed)
History and Physical    Patient: Susan Farrell:[id number] DOB: [birth year] DOA: 09/08/2022 DOS: the patient was seen and examined on 09/09/2022 PCP: Barbette Reichmann, MD  Patient coming from: Home  Chief Complaint:  Chief Complaint  Patient presents with   Dizziness    HPI: Susan Farrell is a 77 y.o. 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  Onset was at 5:30 PM on 09/08/2022.  She had associated nausea and went on to have 3 episodes of nonbloody nonbilious vomiting.  Since the onset she has continued to feel dizzy and whereas previously she could stand with the assistance of her cane she is unable to maintain her balance when standing and has to remain seated.  She was previously in her usual state of health.  She denies headache or visual disturbance.  She has no diarrhea, dysuria, abdominal pain, fever or chills.  Patient arrived to the ED at 8:55 PM however was not evaluated until later on around 11 PM when concern arose for possible CVA. ED course and data review: Vitals within normal limits.  Labs including CBC, BMP, TSH, troponin mostly unremarkable except for mild anemia with hemoglobin of 10.1 which is about her baseline. EKG, personally viewed and interpreted showing sinus at 70 with T wave inversion in anterolateral leads CTA head and neck and MRI were ordered which were consistent with stroke as follows  MRI IMPRESSION: Acute infarct in the right superior cerebellum and vermis, which correlates with the right superior cerebellar artery territory. No evidence of hemorrhagic transformation.  IMPRESSION: 1. Hypodensity in the superior right cerebellum and vermis, consistent with the  acute right SCA territory infarct seen on the same-day MRI. 2. Decreased caliber of proximal right SCA compared to 08/28/2021, although the vessel does appear patent proximally. 3. No intracranial large vessel occlusion. 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 4. No hemodynamically significant stenosis in the neck.  Patient was treated with meclizine and Zofran prior to imaging  By the time of the imaging report, patient was well out of the window for thrombolytic therapy.  Hospitalist consulted for admission.   Review of Systems: As mentioned in the history of present illness. All other systems reviewed and are negative.  Past Medical History:  Diagnosis Date   Hypertension    Stroke [facility])    Past Surgical History:  Procedure Laterality Date   ABDOMINAL HYSTERECTOMY     CHOLECYSTECTOMY     ENDOVASCULAR REPAIR/STENT GRAFT N/A 06/12/2020   Procedure: ENDOVASCULAR REPAIR/STENT GRAFT;  Surgeon: Renford Dills, MD;  Location: [facility] INVASIVE CV LAB;  Service: Cardiovascular;  Laterality: N/A;   Social History:  reports that she has been smoking cigarettes. She uses smokeless tobacco. She reports that she does not currently use alcohol. She reports that she does not use drugs.  No Known Allergies  Family History  Problem Relation Age of Onset   Hypertension Mother    Hypertension Father    Diabetes Sister    Breast cancer Neg Hx     Prior to Admission medications   Medication Sig Start Date End Date Taking? Authorizing Provider  amLODipine (NORVASC) 2.5 MG tablet Take 2.5 mg by mouth daily. 10/04/21   [provider]  aspirin EC 81 MG tablet Take 1 tablet (81 mg total) by mouth daily. Swallow whole. 09/03/21   Charise Killian, MD  Calcium Carbonate-Vitamin D 600-200 MG-UNIT TABS Take 1 tablet by mouth daily. 09/12/21   Angiulli, Mcarthur Rossetti, PA-C  docusate sodium (COLACE) 100 MG capsule Take 100 mg by mouth daily.    [provider]  halobetasol  (ULTRAVATE) 0.05 % ointment Apply topically 2 (two) times daily. 08/14/21   [provider]  iron polysaccharides (FERREX 150) 150 MG capsule Take 1 capsule by mouth daily. 07/10/21   [provider]  meclizine (ANTIVERT) 25 MG tablet Take 1 tablet (25 mg total) by mouth 3 (three) times daily as needed for dizziness. Patient not taking: Reported on 04/21/2022 11/09/21   Dionne Bucy, MD  metoprolol succinate (TOPROL-XL) 50 MG 24 hr tablet Take 1 tablet (50 mg total) by mouth daily. 09/12/21   Angiulli, Mcarthur Rossetti, PA-C  Multiple Vitamins-Minerals (WOMENS MULTIVITAMIN + COLLAGEN PO) Take 1 tablet by mouth daily.    [provider]  rosuvastatin (CRESTOR) 40 MG tablet Take 1 tablet (40 mg total) by mouth daily. 09/12/21 04/21/22  Angiulli, Mcarthur Rossetti, PA-C  SYNTHROID 100 MCG tablet Take 1 tablet (100 mcg total) by mouth daily. 09/12/21   Angiulli, Mcarthur Rossetti, PA-C  tiZANidine (ZANAFLEX) 2 MG tablet Take 2 mg by mouth 3 (three) times daily. Patient not taking: Reported on 04/21/2022 10/03/21   [provider]    Physical Exam: Vitals:   09/08/22 2245 09/08/22 2300 09/09/22 0039 09/09/22 0041  BP: (!) 159/81 (!) 159/84 (!) 159/72   Pulse: 70 66  79  Resp: (!) 21 18  19   Temp:      TempSrc:      SpO2:      Weight:      Height:       Physical Exam Vitals and nursing note reviewed.  Constitutional:      General: She is not in acute distress. HENT:     Head: Normocephalic and atraumatic.  Cardiovascular:     Rate and Rhythm: Normal rate and regular rhythm.     Heart sounds: Normal heart sounds.  Pulmonary:     Effort: Tachypnea present.     Breath sounds: Normal breath sounds.  Abdominal:     Palpations: Abdomen is soft.     Tenderness: There is no abdominal tenderness.  Skin:    Comments: Large patches of hypopigmentation on trunk face and extremities related to discoid lupus  Neurological:     Coordination: Finger-Nose-Finger Test abnormal.     Comments:  Left lower extremity weakness from prior stroke     Labs on Admission: I have personally reviewed following labs and imaging studies  CBC: Recent Labs  Lab 09/08/22 2102  WBC 8.1  HGB 10.1*  HCT 32.7*  MCV 77.9*  PLT 269   Basic Metabolic Panel: Recent Labs  Lab 09/08/22 2102  NA 138  K 4.2  CL 104  CO2 25  GLUCOSE 138*  BUN 14  CREATININE 0.85  CALCIUM 9.1   GFR: Estimated Creatinine Clearance: 59.9 mL/min (by C-G formula based on SCr of 0.85 mg/dL). Liver Function Tests: No results for input(s): "AST", "ALT", "ALKPHOS", "BILITOT", "PROT", "ALBUMIN" in the last 168 hours. No results for input(s): "LIPASE", "AMYLASE" in the last 168 hours. No results for input(s): "AMMONIA" in the last 168 hours. Coagulation Profile: No results for input(s): "INR", "PROTIME" in the last 168 hours. Cardiac Enzymes: No results  for input(s): "CKTOTAL", "CKMB", "CKMBINDEX", "TROPONINI" in the last 168 hours. BNP (last 3 results) No results for input(s): "PROBNP" in the last 8760 hours. HbA1C: No results for input(s): "HGBA1C" in the last 72 hours. CBG: No results for input(s): "GLUCAP" in the last 168 hours. Lipid Profile: No results for input(s): "CHOL", "HDL", "LDLCALC", "TRIG", "CHOLHDL", "LDLDIRECT" in the last 72 hours. Thyroid Function Tests: Recent Labs    09/08/22 2102  TSH 0.448  FREET4 1.24*   Anemia Panel: No results for input(s): "VITAMINB12", "FOLATE", "FERRITIN", "TIBC", "IRON", "RETICCTPCT" in the last 72 hours. Urine analysis:    Component Value Date/Time   COLORURINE YELLOW (A) 08/28/2021 2119   APPEARANCEUR CLEAR (A) 08/28/2021 2119   APPEARANCEUR Hazy 02/10/2012 0812   LABSPEC 1.012 08/28/2021 2119   LABSPEC 1.015 02/10/2012 0812   PHURINE 7.0 08/28/2021 2119   GLUCOSEU NEGATIVE 08/28/2021 2119   GLUCOSEU Negative 02/10/2012 0812   HGBUR NEGATIVE 08/28/2021 2119   BILIRUBINUR NEGATIVE 08/28/2021 2119   BILIRUBINUR Negative 02/10/2012 0812    KETONESUR NEGATIVE 08/28/2021 2119   PROTEINUR NEGATIVE 08/28/2021 2119   NITRITE NEGATIVE 08/28/2021 2119   LEUKOCYTESUR TRACE (A) 08/28/2021 2119   LEUKOCYTESUR Negative 02/10/2012 0812    Radiological Exams on Admission: CT Angio Head Neck W WO CM  Result Date: 09/09/2022 CLINICAL DATA:  Central vertigo, emesis EXAM: CT ANGIOGRAPHY HEAD AND NECK WITH AND WITHOUT CONTRAST TECHNIQUE: Multidetector CT imaging of the head and neck was performed using the standard protocol during bolus administration of intravenous contrast. Multiplanar CT image reconstructions and MIPs were obtained to evaluate the vascular anatomy. Carotid stenosis measurements (when applicable) are obtained utilizing NASCET criteria, using the distal internal carotid diameter as the denominator. RADIATION DOSE REDUCTION: This exam was performed according to the departmental dose-optimization program which includes automated exposure control, adjustment of the mA and/or kV according to patient size and/or use of iterative reconstruction technique. CONTRAST:  75mL OMNIPAQUE IOHEXOL 350 MG/ML SOLN COMPARISON:  11/09/2021 CT head, 08/28/2021 CTA head and neck, 09/09/2022 MRI head FINDINGS: CT HEAD FINDINGS Brain: Hypodensity in the superior right cerebellum and vermis, consistent with the acute right SCA territory infarct seen on the same-day MRI. No evidence of acute hemorrhage, mass, mass effect, or midline shift. No hydrocephalus or extra-axial fluid collection. Sequela of remote right basal ganglia infarct. Vascular: No hyperdense vessel. Skull: Negative for fracture or focal lesion. Sinuses/Orbits: No acute finding. Other: The mastoid air cells are well aerated. CTA NECK FINDINGS Aortic arch: Standard branching. Imaged portion shows no evidence of aneurysm or dissection. No significant stenosis of the major arch vessel origins. Right carotid system: No evidence of dissection, occlusion, or hemodynamically significant stenosis (greater  than 50%). Left carotid system: No evidence of dissection, occlusion, or hemodynamically significant stenosis (greater than 50%). Vertebral arteries: No evidence of dissection, occlusion, or hemodynamically significant stenosis (greater than 50%). Skeleton: No acute osseous abnormality. Degenerative changes in the cervical spine. Other neck: Negative. Upper chest: No focal pulmonary opacity or pleural effusion. Review of the MIP images confirms the above findings CTA HEAD FINDINGS Evaluation is somewhat limited by bolus timing and motion. This particularly affects the distal anterior circulation. Anterior circulation: Both internal carotid arteries are patent to the termini, without significant stenosis. A1 segments patent. Normal anterior communicating artery. Anterior cerebral arteries are patent through the A2 segments, with more distal segments limited by motion. No M1 stenosis or occlusion. MCA branches perfused through the M2 segments, with more distal segments limited by motion. Posterior  circulation: Vertebral arteries patent to the vertebrobasilar junction without significant stenosis. Posterior inferior cerebellar arteries patent proximally. Basilar patent to its distal aspect without significant stenosis. Superior cerebellar arteries patent proximally, although the right proximal SCA is decreased in caliber compared to the prior exam (series 10, image 126) Patent P1 segments. Moderate to severe stenosis in the proximal right P3 (series 10, image 114 and series 11, image 24). PCAs perfused to their distal aspects without significant stenosis. The bilateral posterior communicating arteries are not visualized. Venous sinuses: As permitted by contrast timing and motion, patent. Anatomic variants: None significant. Review of the MIP images confirms the above findings IMPRESSION: 1. Hypodensity in the superior right cerebellum and vermis, consistent with the acute right SCA territory infarct seen on the  same-day MRI. 2. Decreased caliber of proximal right SCA compared to 08/28/2021, although the vessel does appear patent proximally. 3. No intracranial large vessel occlusion. 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 4. No hemodynamically significant stenosis in the neck. These results were called by telephone at the time of interpretation on 09/09/2022 at 1:06 am to provider [facility] , who verbally acknowledged these results. Electronically Signed   By: Wiliam Ke M.D.   On: 09/09/2022 01:17   MR BRAIN WO CONTRAST  Result Date: 09/09/2022 CLINICAL DATA:  Dizziness, stroke suspected, vomiting EXAM: MRI HEAD WITHOUT CONTRAST TECHNIQUE: Multiplanar, multiecho pulse sequences of the brain and surrounding structures were obtained without intravenous contrast. COMPARISON:  08/29/2021 MRI head, correlation is also made with 11/09/2021 CT head FINDINGS: Brain: Restricted diffusion with ADC correlate in the right superior cerebellum and vermis, which correlates the right superior cerebellar artery territory. This area is associated with increased T2 hyperintense signal, likely cytotoxic edema. No evidence of hemorrhagic transformation. No acute hemorrhage, mass, mass effect, or midline shift. No hydrocephalus or extra-axial collection. Partial empty sella. Normal craniocervical junction. No hemosiderin deposition to suggest remote hemorrhage. Remote infarct in the right basal ganglia. Somewhat disproportionate volume loss in the posterior left frontal lobe (series 15, image 39). Scattered T2 hyperintense signal in the periventricular white matter, likely the sequela of chronic small vessel ischemic disease. Vascular: Normal arterial flow voids. Skull and upper cervical spine: Normal marrow signal. Sinuses/Orbits: Clear paranasal sinuses. No acute finding in the orbits. Other: The mastoid air cells are well aerated. IMPRESSION: Acute infarct in the right superior cerebellum and vermis, which correlates  with the right superior cerebellar artery territory. No evidence of hemorrhagic transformation. These results were called by telephone at the time of interpretation on 09/09/2022 at 1:06 am to provider [facility] , who verbally acknowledged these results. Electronically Signed   By: Wiliam Ke M.D.   On: 09/09/2022 01:07   DG Chest 2 View  Result Date: 09/08/2022 CLINICAL DATA:  Dizziness EXAM: CHEST - 2 VIEW COMPARISON:  08/28/2021 FINDINGS: Cardiomegaly. Mediastinal contours within normal limits. Linear left lower lung opacities, favor atelectasis. Right lung clear. No effusions or edema. No acute bony abnormality. IMPRESSION: Cardiomegaly. Linear left lower lung opacities, favor atelectasis. Electronically Signed   By: Charlett Nose M.D.   On: 09/08/2022 22:22     Data Reviewed: Relevant notes from primary care and specialist visits, past discharge summaries as available in EHR, including Care Everywhere. Prior diagnostic testing as pertinent to current admission diagnoses Updated medications and problem lists for reconciliation ED course, including vitals, labs, imaging, treatment and response to treatment Triage notes, nursing and pharmacy notes and ED provider's notes Notable results as  noted in HPI   Assessment and Plan: * Cerebellar stroke, acute (HCC) History of right basal ganglia stroke with residual left hemiparesis Patient presents with nausea vomiting and unsteady gait, diagnosis made outside tPA window Permissive hypertension for first 24-48 hrs post stroke onset: Prn Labetalol IV or Vasotec IV If BP greater than 220/120  Statins for LDL goal less than 70 ASA 81mg  daily, Plavix 75mg  daily x 3 weeks then monotherapy thereafter Telemetry,  Avoid dextrose containing fluids, Maintain euglycemia, euthermia Neuro checks q4 hrs x 24 hrs and then per shift Head of bed 30 degrees Physical therapy/Occupational therapy/Speech therapy if failed dysphagia screen Neurology consult to  follow   Hypothyroidism Continue levothyroxine  Discoid lupus No acute issues  HTN (hypertension) Hold antihypertensives to allow for permissive hypertension  History of abdominal aortic aneurysm (AAA) repair No acute issues suspected  Chronic anemia At baseline     DVT prophylaxis: Lovenox  Consults: Neurology, Dr. Iver Nestle  Advance Care Planning:   Code Status: Prior   Family Communication: Husband at bedside  Disposition Plan: Back to previous home environment  Severity of Illness: The appropriate patient status for this patient is INPATIENT. Inpatient status is judged to be reasonable and necessary in order to provide the required intensity of service to ensure the patient's safety. The patient's presenting symptoms, physical exam findings, and initial radiographic and laboratory data in the context of their chronic comorbidities is felt to place them at high risk for further clinical deterioration. Furthermore, it is not anticipated that the patient will be medically stable for discharge from the hospital within 2 midnights of admission.   * I certify that at the point of admission it is my clinical judgment that the patient will require inpatient hospital care spanning beyond 2 midnights from the point of admission due to high intensity of service, high risk for further deterioration and high frequency of surveillance required.*  Author: Andris Baumann, MD 09/09/2022 1:39 AM  For on call review [email or web address].

## 2022-09-09 NOTE — ED Notes (Signed)
Pt sleeping at this time. No clinical signs of distress. Spouse at bedside.

## 2022-09-09 NOTE — Assessment & Plan Note (Signed)
On Chantix

## 2022-09-10 ENCOUNTER — Inpatient Hospital Stay (HOSPITAL_COMMUNITY)
Admit: 2022-09-10 | Discharge: 2022-09-10 | Disposition: A | Discharge status: Home / Self Care | Payer: Medicare HMO | Attending: Neurology | Admitting: Neurology

## 2022-09-10 ENCOUNTER — Inpatient Hospital Stay: Admit: 2022-09-10 | Payer: Medicare HMO

## 2022-09-10 DIAGNOSIS — I639 Cerebral infarction, unspecified: Secondary | ICD-10-CM | POA: Diagnosis not present

## 2022-09-10 DIAGNOSIS — I6389 Other cerebral infarction: Secondary | ICD-10-CM

## 2022-09-10 LAB — CBC
HCT: 30.3 % — ABNORMAL LOW (ref 36.0–46.0)
Hemoglobin: 9.3 g/dL — ABNORMAL LOW (ref 12.0–15.0)
MCH: 24 pg — ABNORMAL LOW (ref 26.0–34.0)
MCHC: 30.7 g/dL (ref 30.0–36.0)
MCV: 78.1 fL — ABNORMAL LOW (ref 80.0–100.0)
Platelets: 199 10*3/uL (ref 150–400)
RBC: 3.88 MIL/uL (ref 3.87–5.11)
RDW: 17.2 % — ABNORMAL HIGH (ref 11.5–15.5)
WBC: 4 10*3/uL (ref 4.0–10.5)
nRBC: 0 % (ref 0.0–0.2)

## 2022-09-10 LAB — ECHOCARDIOGRAM COMPLETE
AR max vel: 2.21 cm2
AV Area VTI: 2.6 cm2
AV Area mean vel: 2.19 cm2
AV Mean grad: 5.5 mmHg
AV Peak grad: 9.9 mmHg
Ao pk vel: 1.58 m/s
Area-P 1/2: 2.59 cm2
Height: 66 in
MV VTI: 2.79 cm2
S' Lateral: 2.5 cm
Weight: 2720 [oz_av]

## 2022-09-10 LAB — BASIC METABOLIC PANEL
Anion gap: 8 (ref 5–15)
BUN: 10 mg/dL (ref 8–23)
CO2: 24 mmol/L (ref 22–32)
Calcium: 8.4 mg/dL — ABNORMAL LOW (ref 8.9–10.3)
Chloride: 106 mmol/L (ref 98–111)
Creatinine, Ser: 0.82 mg/dL (ref 0.44–1.00)
GFR, Estimated: >60 mL/min (ref >60)
Glucose, Bld: 85 mg/dL (ref 70–99)
Potassium: 3.4 mmol/L — ABNORMAL LOW (ref 3.5–5.1)
Sodium: 138 mmol/L (ref 135–145)

## 2022-09-10 MED ORDER — POTASSIUM CHLORIDE CRYS ER 20 MEQ PO TBCR
20.0000 meq | EXTENDED_RELEASE_TABLET | Freq: Once | ORAL | Status: AC
  Administered 2022-09-10: 20 meq via ORAL
  Filled 2022-09-10: qty 1

## 2022-09-10 NOTE — Progress Notes (Addendum)
Inpatient Rehab Admissions Coordinator:    I spoke with pt.'s husband to discuss potential CIR admit. He states that Pt. Was recently on CIR and did well and that he and patient are in agreement that if she needs rehab, they want CIR. I wills send case to insurance and pursue for admit.   Addendum: I spoke with pt. And confirmed Pt. Is interested in CIR admit.   Megan Salon, MS, CCC-SLP Rehab Admissions Coordinator  [phone] (celll) [phone] (office)

## 2022-09-10 NOTE — Progress Notes (Signed)
[facility]  - [place] at [facility]   PATIENT NAME: Susan Farrell    MR#:  [id number]  DATE OF BIRTH:  1945-05-15  SUBJECTIVE:   No family at bedside. Patient denies any focal weakness. Has some dizziness but improved since yesterday.   VITALS:  Blood pressure (!) 131/91, pulse 72, temperature 99.1 F (37.3 C), resp. rate 18, height 5\' 6"  (1.676 m), weight 77.1 kg, SpO2 99%.  PHYSICAL EXAMINATION:   GENERAL:  77 y.o.-year-old patient with no acute distress.  LUNGS: Normal breath sounds bilaterally, no wheezing CARDIOVASCULAR: S1, S2 normal. No murmur   ABDOMEN: Soft, nontender, nondistended. Bowel sounds present.  EXTREMITIES: No  edema b/l.    NEUROLOGIC: nonfocal  patient is alert and awake SKIN: No obvious rash, lesion, or ulcer.   LABORATORY PANEL:  CBC Recent Labs  Lab 09/10/22 0508  WBC 4.0  HGB 9.3*  HCT 30.3*  PLT 199    Chemistries  Recent Labs  Lab 09/10/22 0508  NA 138  K 3.4*  CL 106  CO2 24  GLUCOSE 85  BUN 10  CREATININE 0.82  CALCIUM 8.4*   Cardiac Enzymes No results for input(s): "TROPONINI" in the last 168 hours. RADIOLOGY:  ECHOCARDIOGRAM COMPLETE  Result Date: 09/10/2022    ECHOCARDIOGRAM REPORT   Patient Name:   Susan Farrell Date of Exam: 09/10/2022 Medical Rec #:  [id number]        Height:       66.0 in Accession #:    [id number]       Weight:       170.0 lb Date of Birth:  09-16-1945        BSA:          1.866 m Patient Age:    77 years         BP:           146/63 mmHg Patient Gender: F                HR:           67 bpm. Exam Location:  [facility] Procedure: 2D Echo, Cardiac Doppler, Color Doppler and Saline Contrast Bubble            Study Indications:     Stroke I63.9  History:         Patient has prior history of Echocardiogram examinations, most                  recent 08/29/2021. Stroke; Risk Factors:Hypertension.  Sonographer:     Cristela Blue Referring Phys:  [id number] Gordy Councilman Diagnosing Phys: Julien Nordmann MD  IMPRESSIONS  1. Left ventricular ejection fraction, by estimation, is 60 to 65%. The left ventricle has normal function. The left ventricle has no regional wall motion abnormalities. There is moderate left ventricular hypertrophy. Left ventricular diastolic parameters are consistent with Grade I diastolic dysfunction (impaired relaxation).  2. Right ventricular systolic function is normal. The right ventricular size is normal. There is normal pulmonary artery systolic pressure. The estimated right ventricular systolic pressure is 20.4 mmHg.  3. The mitral valve is normal in structure. Mild mitral valve regurgitation. No evidence of mitral stenosis.  4. The aortic valve is normal in structure. There is mild calcification of the aortic valve. Aortic valve regurgitation is not visualized. Aortic valve sclerosis is present, with no evidence of aortic valve stenosis.  5. The inferior vena cava is normal in size with greater than 50% respiratory  variability, suggesting right atrial pressure of 3 mmHg.  6. Agitated saline contrast bubble study was negative, with no evidence of any interatrial shunt. FINDINGS  Left Ventricle: Left ventricular ejection fraction, by estimation, is 60 to 65%. The left ventricle has normal function. The left ventricle has no regional wall motion abnormalities. The left ventricular internal cavity size was normal in size. There is  moderate left ventricular hypertrophy. Left ventricular diastolic parameters are consistent with Grade I diastolic dysfunction (impaired relaxation). Right Ventricle: The right ventricular size is normal. No increase in right ventricular wall thickness. Right ventricular systolic function is normal. There is normal pulmonary artery systolic pressure. The tricuspid regurgitant velocity is 1.96 m/s, and  with an assumed right atrial pressure of 5 mmHg, the estimated right ventricular systolic pressure is 20.4 mmHg. Left Atrium: Left atrial size was normal in size. Right  Atrium: Right atrial size was normal in size. Pericardium: There is no evidence of pericardial effusion. Mitral Valve: The mitral valve is normal in structure. There is mild calcification of the mitral valve leaflet(s). Mild mitral valve regurgitation. No evidence of mitral valve stenosis. MV peak gradient, 6.2 mmHg. The mean mitral valve gradient is 2.0 mmHg. Tricuspid Valve: The tricuspid valve is normal in structure. Tricuspid valve regurgitation is mild . No evidence of tricuspid stenosis. Aortic Valve: The aortic valve is normal in structure. There is mild calcification of the aortic valve. Aortic valve regurgitation is not visualized. Aortic valve sclerosis is present, with no evidence of aortic valve stenosis. Aortic valve mean gradient  measures 5.5 mmHg. Aortic valve peak gradient measures 9.9 mmHg. Aortic valve area, by VTI measures 2.60 cm. Pulmonic Valve: The pulmonic valve was normal in structure. Pulmonic valve regurgitation is not visualized. No evidence of pulmonic stenosis. Aorta: The aortic root is normal in size and structure. Venous: The inferior vena cava is normal in size with greater than 50% respiratory variability, suggesting right atrial pressure of 3 mmHg. IAS/Shunts: No atrial level shunt detected by color flow Doppler. Agitated saline contrast was given intravenously to evaluate for intracardiac shunting. Agitated saline contrast bubble study was negative, with no evidence of any interatrial shunt. There  is no evidence of a patent foramen ovale. There is no evidence of an atrial septal defect.  LEFT VENTRICLE PLAX 2D LVIDd:         3.90 cm   Diastology LVIDs:         2.50 cm   LV e' medial:    5.98 cm/s LV PW:         1.30 cm   LV E/e' medial:  14.4 LV IVS:        1.70 cm   LV e' lateral:   8.92 cm/s LVOT diam:     2.00 cm   LV E/e' lateral: 9.6 LV SV:         82 LV SV Index:   44 LVOT Area:     3.14 cm  RIGHT VENTRICLE RV Basal diam:  3.70 cm RV Mid diam:    2.40 cm RV S prime:      15.80 cm/s TAPSE (M-mode): 1.8 cm LEFT ATRIUM             Index        RIGHT ATRIUM           Index LA diam:        3.00 cm 1.61 cm/m   RA Area:     19.30 cm LA Vol (A2C):  33.9 ml 18.16 ml/m  RA Volume:   55.20 ml  29.58 ml/m LA Vol (A4C):   53.8 ml 28.83 ml/m LA Biplane Vol: 44.2 ml 23.68 ml/m  AORTIC VALVE AV Area (Vmax):    2.21 cm AV Area (Vmean):   2.19 cm AV Area (VTI):     2.60 cm AV Vmax:           157.50 cm/s AV Vmean:          108.650 cm/s AV VTI:            0.317 m AV Peak Grad:      9.9 mmHg AV Mean Grad:      5.5 mmHg LVOT Vmax:         111.00 cm/s LVOT Vmean:        75.600 cm/s LVOT VTI:          0.262 m LVOT/AV VTI ratio: 0.83  AORTA Ao Root diam: 3.10 cm MITRAL VALVE                TRICUSPID VALVE MV Area (PHT): 2.59 cm     TR Peak grad:   15.4 mmHg MV Area VTI:   2.79 cm     TR Vmax:        196.00 cm/s MV Peak grad:  6.2 mmHg MV Mean grad:  2.0 mmHg     SHUNTS MV Vmax:       1.25 m/s     Systemic VTI:  0.26 m MV Vmean:      66.5 cm/s    Systemic Diam: 2.00 cm MV Decel Time: 293 msec MV E velocity: 85.90 cm/s MV A velocity: 116.00 cm/s MV E/A ratio:  0.74 Julien Nordmann MD Electronically signed by Julien Nordmann MD Signature Date/Time: 09/10/2022/12:01:02 PM    Final    CT Angio Head Neck W WO CM  Result Date: 09/09/2022 CLINICAL DATA:  Central vertigo, emesis EXAM: CT ANGIOGRAPHY HEAD AND NECK WITH AND WITHOUT CONTRAST TECHNIQUE: Multidetector CT imaging of the head and neck was performed using the standard protocol during bolus administration of intravenous contrast. Multiplanar CT image reconstructions and MIPs were obtained to evaluate the vascular anatomy. Carotid stenosis measurements (when applicable) are obtained utilizing NASCET criteria, using the distal internal carotid diameter as the denominator. RADIATION DOSE REDUCTION: This exam was performed according to the departmental dose-optimization program which includes automated exposure control, adjustment of the mA and/or  kV according to patient size and/or use of iterative reconstruction technique. CONTRAST:  75mL OMNIPAQUE IOHEXOL 350 MG/ML SOLN COMPARISON:  11/09/2021 CT head, 08/28/2021 CTA head and neck, 09/09/2022 MRI head FINDINGS: CT HEAD FINDINGS Brain: Hypodensity in the superior right cerebellum and vermis, consistent with the acute right SCA territory infarct seen on the same-day MRI. No evidence of acute hemorrhage, mass, mass effect, or midline shift. No hydrocephalus or extra-axial fluid collection. Sequela of remote right basal ganglia infarct. Vascular: No hyperdense vessel. Skull: Negative for fracture or focal lesion. Sinuses/Orbits: No acute finding. Other: The mastoid air cells are well aerated. CTA NECK FINDINGS Aortic arch: Standard branching. Imaged portion shows no evidence of aneurysm or dissection. No significant stenosis of the major arch vessel origins. Right carotid system: No evidence of dissection, occlusion, or hemodynamically significant stenosis (greater than 50%). Left carotid system: No evidence of dissection, occlusion, or hemodynamically significant stenosis (greater than 50%). Vertebral arteries: No evidence of dissection, occlusion, or hemodynamically significant stenosis (greater than 50%). Skeleton: No acute osseous abnormality. Degenerative changes  in the cervical spine. Other neck: Negative. Upper chest: No focal pulmonary opacity or pleural effusion. Review of the MIP images confirms the above findings CTA HEAD FINDINGS Evaluation is somewhat limited by bolus timing and motion. This particularly affects the distal anterior circulation. Anterior circulation: Both internal carotid arteries are patent to the termini, without significant stenosis. A1 segments patent. Normal anterior communicating artery. Anterior cerebral arteries are patent through the A2 segments, with more distal segments limited by motion. No M1 stenosis or occlusion. MCA branches perfused through the M2 segments, with  more distal segments limited by motion. Posterior circulation: Vertebral arteries patent to the vertebrobasilar junction without significant stenosis. Posterior inferior cerebellar arteries patent proximally. Basilar patent to its distal aspect without significant stenosis. Superior cerebellar arteries patent proximally, although the right proximal SCA is decreased in caliber compared to the prior exam (series 10, image 126) Patent P1 segments. Moderate to severe stenosis in the proximal right P3 (series 10, image 114 and series 11, image 24). PCAs perfused to their distal aspects without significant stenosis. The bilateral posterior communicating arteries are not visualized. Venous sinuses: As permitted by contrast timing and motion, patent. Anatomic variants: None significant. Review of the MIP images confirms the above findings IMPRESSION: 1. Hypodensity in the superior right cerebellum and vermis, consistent with the acute right SCA territory infarct seen on the same-day MRI. 2. Decreased caliber of proximal right SCA compared to 08/28/2021, although the vessel does appear patent proximally. 3. No intracranial large vessel occlusion. 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 4. No hemodynamically significant stenosis in the neck. These results were called by telephone at the time of interpretation on 09/09/2022 at 1:06 am to provider [facility] , who verbally acknowledged these results. Electronically Signed   By: Wiliam Ke M.D.   On: 09/09/2022 01:17   MR BRAIN WO CONTRAST  Result Date: 09/09/2022 CLINICAL DATA:  Dizziness, stroke suspected, vomiting EXAM: MRI HEAD WITHOUT CONTRAST TECHNIQUE: Multiplanar, multiecho pulse sequences of the brain and surrounding structures were obtained without intravenous contrast. COMPARISON:  08/29/2021 MRI head, correlation is also made with 11/09/2021 CT head FINDINGS: Brain: Restricted diffusion with ADC correlate in the right superior cerebellum and  vermis, which correlates the right superior cerebellar artery territory. This area is associated with increased T2 hyperintense signal, likely cytotoxic edema. No evidence of hemorrhagic transformation. No acute hemorrhage, mass, mass effect, or midline shift. No hydrocephalus or extra-axial collection. Partial empty sella. Normal craniocervical junction. No hemosiderin deposition to suggest remote hemorrhage. Remote infarct in the right basal ganglia. Somewhat disproportionate volume loss in the posterior left frontal lobe (series 15, image 39). Scattered T2 hyperintense signal in the periventricular white matter, likely the sequela of chronic small vessel ischemic disease. Vascular: Normal arterial flow voids. Skull and upper cervical spine: Normal marrow signal. Sinuses/Orbits: Clear paranasal sinuses. No acute finding in the orbits. Other: The mastoid air cells are well aerated. IMPRESSION: Acute infarct in the right superior cerebellum and vermis, which correlates with the right superior cerebellar artery territory. No evidence of hemorrhagic transformation. These results were called by telephone at the time of interpretation on 09/09/2022 at 1:06 am to provider [facility] , who verbally acknowledged these results. Electronically Signed   By: Wiliam Ke M.D.   On: 09/09/2022 01:07   DG Chest 2 View  Result Date: 09/08/2022 CLINICAL DATA:  Dizziness EXAM: CHEST - 2 VIEW COMPARISON:  08/28/2021 FINDINGS: Cardiomegaly. Mediastinal contours within normal limits. Linear left lower lung  opacities, favor atelectasis. Right lung clear. No effusions or edema. No acute bony abnormality. IMPRESSION: Cardiomegaly. Linear left lower lung opacities, favor atelectasis. Electronically Signed   By: Charlett Nose M.D.   On: 09/08/2022 22:22    Assessment and Plan 77 y.o. 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  Onset was at 5:30 PM on 09/08/2022.  She had associated nausea and went on to have 3 episodes of nonbloody nonbilious vomiting.  Since the onset she has continued to feel dizzy and whereas previously she could stand with the assistance of her cane she is unable to maintain her balance when standing and has to remain seated   MRI showing right superior cerebellar stroke. CT angio showing possibility of a right ICA aneurysm.   Cerebellar stroke, acute (HCC) History of right basal ganglia stroke with residual left hemiparesis --Patient given 300 mg of Plavix and then will do 75 mg of Plavix after that.  -- cont aspirin. --  Patient on Crestor 40 mg daily.  LDL 52.   --PT recommending rehab (possible candidate for CIR)--evaluated by Dr Ty Hilts    Hypothyroidism --Continue levothyroxine   Thyroid nodule --Will need outpatient thyroid ultrasound   Tobacco abuse --On Chantix   Anemia of chronic disease --Hemoglobin 9.7.   Discoid lupus --No acute issues   HTN (hypertension) --Hold antihypertensives to allow for permissive hypertension   History of abdominal aortic aneurysm (AAA) repair --No acute issues suspected   Family communication :none today Consults : neurology CODE STATUS: full DVT Prophylaxis : enoxaparin Level of care: Progressive Status is: Inpatient Remains inpatient appropriate because: acute CVA    TOTAL TIME TAKING CARE OF THIS PATIENT: 35 minutes.  >50% time spent on counselling and coordination of care  Note: This dictation was prepared with Dragon dictation along with smaller phrase technology. Any transcriptional errors that result from this process are unintentional.  Enedina Finner M.D    [facility]   CC: Primary care physician; Barbette Reichmann, MD

## 2022-09-10 NOTE — Plan of Care (Signed)

## 2022-09-10 NOTE — TOC Initial Note (Signed)
Transition of Care [facility]) - Initial/Assessment Note    Patient Details  Name: Susan Farrell MRN: [id number] Date of Birth: May 27, 1945  Transition of Care [facility]) CM/SW Contact:    Truddie Hidden, RN Phone Number: 09/10/2022, 3:02 PM  Clinical Narrative:                 Patient referred to CIR per therapy.        Patient Goals and CMS Choice            Expected Discharge Plan and Services                                              Prior Living Arrangements/Services                       Activities of Daily Living   ADL Screening (condition at time of admission) Patient's cognitive ability adequate to safely complete daily activities?: Yes Is the patient deaf or have difficulty hearing?: No Does the patient have difficulty seeing, even when wearing glasses/contacts?: No Does the patient have difficulty concentrating, remembering, or making decisions?: No Patient able to express need for assistance with ADLs?: Yes Does the patient have difficulty dressing or bathing?: Yes Independently performs ADLs?: No Communication: Independent Dressing (OT): Needs assistance Is this a change from baseline?: Change from baseline, expected to last >3 days Grooming: Needs assistance Is this a change from baseline?: Change from baseline, expected to last >3 days Feeding: Independent Bathing: Needs assistance Is this a change from baseline?: Change from baseline, expected to last >3 days Toileting: Needs assistance Is this a change from baseline?: Change from baseline, expected to last >3days In/Out Bed: Needs assistance Is this a change from baseline?: Change from baseline, expected to last >3 days Walks in Home: Needs assistance Is this a change from baseline?: Change from baseline, expected to last >3 days Does the patient have difficulty walking or climbing stairs?: Yes Weakness of Legs: Left Weakness of Arms/Hands: Left  Permission Sought/Granted                   Emotional Assessment              Admission diagnosis:  Cerebellar stroke, acute (HCC) [I63.9] Cerebellar stroke [facility]) [I63.9] Patient Active Problem List   Diagnosis Date Noted   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) 09/09/2022   History of abdominal aortic aneurysm (AAA) repair 09/09/2022   HTN (hypertension) 09/09/2022   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   Anemia of chronic disease 09/09/2022   Tobacco abuse 09/09/2022   Thyroid nodule 09/09/2022   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) 09/02/2021   Hypertensive urgency 08/29/2021   Dyslipidemia 08/29/2021   Hypothyroidism 08/29/2021   Cerebellar stroke, acute (HCC) 08/28/2021   Ruptured abdominal aortic aneurysm (AAA) (HCC) 06/12/2020   Goiter 09/13/2019   Osteopenia 09/13/2019   PCP:  Barbette Reichmann, MD Pharmacy:   [facility] [phone] - Closed - [place], [place] - [address] [place] [address] Phone: [phone] Fax: [phone]  [facility] [address] ([address]), [place] - [address] [place] ([address]) [place] [address] Phone: [phone] Fax: [phone]     Social Determinants of Health (SDOH) Social History: SDOH Screenings  Food Insecurity: No Food Insecurity (09/09/2022)  Housing: Low Risk  (09/09/2022)  Transportation Needs: No Transportation Needs (09/09/2022)  Utilities: Not At Risk (09/09/2022)  Depression (PHQ2-9): Low Risk  (11/14/2021)  Financial Resource Strain: Low Risk  (08/15/2022)   Received from [facility]  Physical Activity: Insufficiently Active (05/12/2019)   Received from [facility], [facility]  Social Connections: Socially Integrated (05/12/2019)   Received from [facility], [facility]  Stress: No Stress Concern Present (04/21/2022)  Tobacco Use: High Risk  (09/09/2022)   SDOH Interventions:     Readmission Risk Interventions     No data to display

## 2022-09-10 NOTE — Progress Notes (Signed)
Physical Therapy Treatment Patient Details Name: Susan Farrell MRN: [id number] DOB: [birth year] Today's Date: 09/10/2022   History of Present Illness Susan Farrell is a 76yoF who comes to [facility] ED on 8/19 after acute onset N/V and dizziness. 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    PT Comments  Pt finishing OT session on authors arrival, she is fatigued, but open to direct transition to PT session. This session specifically focused on gait training, pt taken to hallway to allow for specific focus on straight plain mobility. Chartered loss adjuster utilized chair follow while AMB training in hallway given need for 2 hand support on patient. Pt covers 106ft, then 21ft, recovering between while sitting. 5-6 significant LOB to right side that warrant author to provide maxA stabilization while pt repositions RW and self. Dizziness and global weakness both improved from AM session. AMB distance and quality significantly improved since this morning. Will continue to follow.    If plan is discharge home, recommend the following: A lot of help with walking and/or transfers;A lot of help with bathing/dressing/bathroom;Help with stairs or ramp for entrance   Can travel by private vehicle        Equipment Recommendations  None recommended by PT    Recommendations for Other Services Rehab consult     Precautions / Restrictions Precautions Precautions: Fall Restrictions Weight Bearing Restrictions: No     Mobility  Bed Mobility Overal bed mobility: Modified Independent Bed Mobility: Supine to Sit     Supine to sit: Modified independent (Device/Increase time)     General bed mobility comments: moving better than AM session    Transfers Overall transfer level: Needs assistance Equipment used: Rolling walker (2 wheels) Transfers: Sit to/from Stand Sit to Stand: Contact  guard assist           General transfer comment: no assist needed anymore, but still labored and takes effort to maintain balance    Ambulation/Gait Ambulation/Gait assistance: Contact guard assist, Max assist, +2 safety/equipment (intermittent maxA for LOB recovery 5-6x over 2 walks, otherwise steady with RW and concerted effort) Gait Distance (Feet): 50 Feet Assistive device: Rolling walker (2 wheels) Gait Pattern/deviations: Step-to pattern       General Gait Details: AMB in hallway with chair follow   Stairs             Wheelchair Mobility     Tilt Bed    Modified Rankin (Stroke Patients Only)       Balance                                            Cognition Arousal: Alert Behavior During Therapy: WFL for tasks assessed/performed Overall Cognitive Status: Within Functional Limits for tasks assessed                                          Exercises Other Exercises Other Exercises: STS from recliner, AMB 59ft in hallway c RW, minGuard except for 3 LOB that require maxA due to impaired recovery capabilities. Other Exercises: STS from recliner, AMB 73ft in hallway c RW, minGuard except for 2 LOB that require maxA due to impaired recovery capabilities. Other Exercises: seated  recovery x 60sec between walks, still has consistent LOB to right side when RLE becomes to adducted (midline) in midstance    General Comments        Pertinent Vitals/Pain Pain Assessment Pain Assessment: No/denies pain    Home Living                          Prior Function            PT Goals (current goals can now be found in the care plan section) Acute Rehab PT Goals Patient Stated Goal: regain functional independence and activity tolerance PT Goal Formulation: With patient Time For Goal Achievement: 09/23/22 Potential to Achieve Goals: Good Progress towards PT goals: Progressing toward goals    Frequency    Min  1X/week      PT Plan      Co-evaluation              AM-PAC PT "6 Clicks" Mobility   Outcome Measure  Help needed turning from your back to your side while in a flat bed without using bedrails?: A Little Help needed moving from lying on your back to sitting on the side of a flat bed without using bedrails?: A Little Help needed moving to and from a bed to a chair (including a wheelchair)?: A Lot Help needed standing up from a chair using your arms (e.g., wheelchair or bedside chair)?: A Lot Help needed to walk in hospital room?: A Lot Help needed climbing 3-5 steps with a railing? : A Lot 6 Click Score: 14    End of Session Equipment Utilized During Treatment: Gait belt Activity Tolerance: Patient tolerated treatment well;No increased pain Patient left: in chair;with family/visitor present;with call bell/phone within reach Nurse Communication: Mobility status PT Visit Diagnosis: Difficulty in walking, not elsewhere classified (R26.2);Other abnormalities of gait and mobility (R26.89);Unsteadiness on feet (R26.81);Dizziness and giddiness (R42)     Time: 2130-8657 PT Time Calculation (min) (ACUTE ONLY): 14 min  Charges:    $Neuromuscular Re-education: 8-22 mins PT General Charges $$ ACUTE PT VISIT: 1 Visit                    3:02 PM, 09/10/22 [name], PT, DPT Physical Therapist - [facility]  [phone] (ASCOM)    Luwana Butrick C 09/10/2022, 2:59 PM

## 2022-09-10 NOTE — Consult Note (Signed)
Physical Medicine and Rehabilitation Consult Reason for Consult: Acute cerebellar CVA Referring Physician: Enedina Finner, MD   HPI: Susan Farrell is a 77 y.o. female who came to the [facility] ED on 8/19 after acute onset N/V and dizziness. MRI showed acute infarct in the right superior cerebellum and vermis. PMH includes discoid lupus, HTN, hypothyroidism, AAA s/p rupture and repair, nicotine dependence, and basal ganglia CVA in 08/2001. Physical Medicine & Rehabilitation was consulted to assess candidacy for CIR.     ROS +improved vision Past Medical History:  Diagnosis Date   Hypertension    Stroke [facility])    Past Surgical History:  Procedure Laterality Date   ABDOMINAL HYSTERECTOMY     CHOLECYSTECTOMY     ENDOVASCULAR REPAIR/STENT GRAFT N/A 06/12/2020   Procedure: ENDOVASCULAR REPAIR/STENT GRAFT;  Surgeon: Renford Dills, MD;  Location: [facility] INVASIVE CV LAB;  Service: Cardiovascular;  Laterality: N/A;   Family History  Problem Relation Age of Onset   Hypertension Mother    Hypertension Father    Diabetes Sister    Breast cancer Neg Hx    Social History:  reports that she has been smoking cigarettes. She uses smokeless tobacco. She reports that she does not currently use alcohol. She reports that she does not use drugs. Allergies: No Known Allergies Medications Prior to Admission  Medication Sig Dispense Refill   amLODipine (NORVASC) 5 MG tablet Take 5 mg by mouth daily.     aspirin EC 81 MG tablet Take 1 tablet (81 mg total) by mouth daily. Swallow whole. 30 tablet    Calcium Carbonate-Vitamin D 600-200 MG-UNIT TABS Take 1 tablet by mouth daily. 30 tablet 0   clobetasol cream (TEMOVATE) 0.05 % Apply topically 2 (two) times daily as needed.     docusate sodium (COLACE) 100 MG capsule Take 100 mg by mouth daily.     iron polysaccharides (FERREX 150) 150 MG capsule Take 1 capsule by mouth daily.     metoprolol succinate (TOPROL-XL) 50 MG 24 hr tablet Take 1 tablet (50 mg  total) by mouth daily. 30 tablet 0   Multiple Vitamins-Minerals (WOMENS MULTIVITAMIN + COLLAGEN PO) Take 1 tablet by mouth daily.     rosuvastatin (CRESTOR) 40 MG tablet Take 1 tablet (40 mg total) by mouth daily. 30 tablet 0   SYNTHROID 100 MCG tablet Take 1 tablet (100 mcg total) by mouth daily. 30 tablet 0   varenicline (CHANTIX) 1 MG tablet Take 1 mg by mouth 2 (two) times daily.      Home: Home Living Family/patient expects to be discharged to:: Inpatient rehab Living Arrangements: Spouse/significant other Available Help at Discharge: Family Type of Home: House Home Access: Stairs to enter [employer] of Steps: 2-3 Entrance Stairs-Rails: None Home Layout: One level Bathroom Shower/Tub: Health visitor: Handicapped height Home Equipment: Information systems manager - built in, Agricultural consultant (2 wheels), [employer] - quad  Functional History: Prior Function Prior Level of Function : Independent/Modified Independent, Driving Mobility Comments: Pt using cane for mobility at baseline. ADLs Comments: Pt reports (I) ADLs and most IADLs. Husband does laundry. Pt drives (husband also drives if needed). (I) medication management. Enjoys church activities. Retired from [employer] lab. Functional Status:  Mobility: Bed Mobility Overal bed mobility: Modified Independent Bed Mobility: Supine to Sit Supine to sit: Modified independent (Device/Increase time) Sit to supine: Supervision General bed mobility comments: intermittent abberant trunk control upon coming to EOB, stabilizes Transfers Overall transfer level: Needs assistance Equipment used:  Rolling walker (2 wheels) Transfers: Sit to/from Stand Sit to Stand: Min assist General transfer comment: improve from previous day, but still sturggles with postural alignment with midline and control (right sided weakness) Ambulation/Gait Ambulation/Gait assistance:  (deferred due to urinary incontinence on floor and continued lethargy.) Gait  Distance (Feet): 20 Feet Assistive device: Rolling walker (2 wheels) Gait Pattern/deviations: Step-to pattern General Gait Details: Alternating fwd, backward at bedside; severe difficulty with trunk control in midline, frequent LOB to right side, requires maxA 50% of these for steady. Poor awareness and selection of stance width.    ADL: ADL Overall ADL's : Needs assistance/impaired Eating/Feeding Details (indicate cue type and reason): anticipate supervision, bed level only Grooming Details (indicate cue type and reason): anticipate set up-MIN A, bed level only (needs back support) Upper Body Bathing Details (indicate cue type and reason): anticipate MIN A bed level Lower Body Bathing Details (indicate cue type and reason): anticipate MAX A bed level with back support Upper Body Dressing Details (indicate cue type and reason): anticipate MIN A seated (balance deficit) Lower Body Dressing Details (indicate cue type and reason): anticipate MAX A due to standing balance deficit (pt able to make nearly figure four, but not quite able to get BIL LE over opposite knee; particular difficulty with LLE given baseline weakenss and need for BIL UE to assist with moving LE, meaning UE not available to assist with sitting balance). Toilet Transfer Details (indicate cue type and reason): not tested today for safety; would require +2 assist likely for safety, as pt taking one sidestep at EOB was very off-balance and needing MOD A and RW to maintain balance, with posterior lean. Toileting - Clothing Manipulation Details (indicate cue type and reason): anticipate dependent while standing/bed level Tub/Shower Transfer Details (indicate cue type and reason): not yet safe for tub transfer given dizziness/decreased balance Functional mobility during ADLs: Moderate assistance, Rolling walker (2 wheels) (Only able to take one sidestep with RLE towards HOB; very off-balance, posterior lean.) General ADL Comments: Pt BIL  UE strength/ROM WNL; however, pt having blurring of vision and decreased sitting balance (mild) and decreased standing balance (severe), so high risk for LB and standing ADL deficits at this time. Pt feels dizzy even in supine, which worsens with sitting and worsens further with standing.  Cognition: Cognition Overall Cognitive Status: Within Functional Limits for tasks assessed Orientation Level: Oriented X4 Cognition Arousal: Lethargic, Suspect due to medications Behavior During Therapy: WFL for tasks assessed/performed Overall Cognitive Status: Within Functional Limits for tasks assessed General Comments: A+Ox4. Agreeable, quiet.  Blood pressure (!) 150/79, pulse 72, temperature 98.4 F (36.9 C), temperature source Oral, resp. rate 18, height 5\' 6"  (1.676 m), weight 77.1 kg, SpO2 100%. Physical Exam Gen: no distress, normal appearing HEENT: oral mucosa pink and moist, NCAT Cardio: Reg rate Chest: normal effort, normal rate of breathing Abd: soft, non-distended Ext: no edema Psych: pleasant, normal affect Skin: intact Neuro: Alert and oriented x3 Musculoskeletal: 5/5 strength right side, 4/5 LUE, LLE 2/5 proximally and 4/5 distally  Results for orders placed or performed during the hospital encounter of 09/08/22 (from the past 24 hour(s))  CBC     Status: Abnormal   Collection Time: 09/10/22  5:08 AM  Result Value Ref Range   WBC 4.0 4.0 - 10.5 K/uL   RBC 3.88 3.87 - 5.11 MIL/uL   Hemoglobin 9.3 (L) 12.0 - 15.0 g/dL   HCT 16.1 (L) 09.6 - 04.5 %   MCV 78.1 (L) 80.0 - 100.0  fL   MCH 24.0 (L) 26.0 - 34.0 pg   MCHC 30.7 30.0 - 36.0 g/dL   RDW 64.4 (H) 03.4 - 74.2 %   Platelets 199 150 - 400 K/uL   nRBC 0.0 0.0 - 0.2 %  Basic metabolic panel     Status: Abnormal   Collection Time: 09/10/22  5:08 AM  Result Value Ref Range   Sodium 138 135 - 145 mmol/L   Potassium 3.4 (L) 3.5 - 5.1 mmol/L   Chloride 106 98 - 111 mmol/L   CO2 24 22 - 32 mmol/L   Glucose, Bld 85 70 - 99 mg/dL    BUN 10 8 - 23 mg/dL   Creatinine, Ser 5.95 0.44 - 1.00 mg/dL   Calcium 8.4 (L) 8.9 - 10.3 mg/dL   GFR, Estimated >63 >87 mL/min   Anion gap 8 5 - 15   ECHOCARDIOGRAM COMPLETE  Result Date: 09/10/2022    ECHOCARDIOGRAM REPORT   Patient Name:   Susan Farrell Date of Exam: 09/10/2022 Medical Rec #:  [id number]        Height:       66.0 in Accession #:    [id number]       Weight:       170.0 lb Date of Birth:  [birth year]        BSA:          1.866 m Patient Age:    76 years         BP:           146/63 mmHg Patient Gender: F                HR:           67 bpm. Exam Location:  [facility] Procedure: 2D Echo, Cardiac Doppler, Color Doppler and Saline Contrast Bubble            Study Indications:     Stroke I63.9  History:         Patient has prior history of Echocardiogram examinations, most                  recent 08/29/2021. Stroke; Risk Factors:Hypertension.  Sonographer:     Cristela Blue Referring Phys:  [id number] Gordy Councilman Diagnosing Phys: Julien Nordmann MD IMPRESSIONS  1. Left ventricular ejection fraction, by estimation, is 60 to 65%. The left ventricle has normal function. The left ventricle has no regional wall motion abnormalities. There is moderate left ventricular hypertrophy. Left ventricular diastolic parameters are consistent with Grade I diastolic dysfunction (impaired relaxation).  2. Right ventricular systolic function is normal. The right ventricular size is normal. There is normal pulmonary artery systolic pressure. The estimated right ventricular systolic pressure is 20.4 mmHg.  3. The mitral valve is normal in structure. Mild mitral valve regurgitation. No evidence of mitral stenosis.  4. The aortic valve is normal in structure. There is mild calcification of the aortic valve. Aortic valve regurgitation is not visualized. Aortic valve sclerosis is present, with no evidence of aortic valve stenosis.  5. The inferior vena cava is normal in size with greater than 50% respiratory variability,  suggesting right atrial pressure of 3 mmHg.  6. Agitated saline contrast bubble study was negative, with no evidence of any interatrial shunt. FINDINGS  Left Ventricle: Left ventricular ejection fraction, by estimation, is 60 to 65%. The left ventricle has normal function. The left ventricle has no regional wall motion abnormalities. The left ventricular internal cavity size  was normal in size. There is  moderate left ventricular hypertrophy. Left ventricular diastolic parameters are consistent with Grade I diastolic dysfunction (impaired relaxation). Right Ventricle: The right ventricular size is normal. No increase in right ventricular wall thickness. Right ventricular systolic function is normal. There is normal pulmonary artery systolic pressure. The tricuspid regurgitant velocity is 1.96 m/s, and  with an assumed right atrial pressure of 5 mmHg, the estimated right ventricular systolic pressure is 20.4 mmHg. Left Atrium: Left atrial size was normal in size. Right Atrium: Right atrial size was normal in size. Pericardium: There is no evidence of pericardial effusion. Mitral Valve: The mitral valve is normal in structure. There is mild calcification of the mitral valve leaflet(s). Mild mitral valve regurgitation. No evidence of mitral valve stenosis. MV peak gradient, 6.2 mmHg. The mean mitral valve gradient is 2.0 mmHg. Tricuspid Valve: The tricuspid valve is normal in structure. Tricuspid valve regurgitation is mild . No evidence of tricuspid stenosis. Aortic Valve: The aortic valve is normal in structure. There is mild calcification of the aortic valve. Aortic valve regurgitation is not visualized. Aortic valve sclerosis is present, with no evidence of aortic valve stenosis. Aortic valve mean gradient  measures 5.5 mmHg. Aortic valve peak gradient measures 9.9 mmHg. Aortic valve area, by VTI measures 2.60 cm. Pulmonic Valve: The pulmonic valve was normal in structure. Pulmonic valve regurgitation is not  visualized. No evidence of pulmonic stenosis. Aorta: The aortic root is normal in size and structure. Venous: The inferior vena cava is normal in size with greater than 50% respiratory variability, suggesting right atrial pressure of 3 mmHg. IAS/Shunts: No atrial level shunt detected by color flow Doppler. Agitated saline contrast was given intravenously to evaluate for intracardiac shunting. Agitated saline contrast bubble study was negative, with no evidence of any interatrial shunt. There  is no evidence of a patent foramen ovale. There is no evidence of an atrial septal defect.  LEFT VENTRICLE PLAX 2D LVIDd:         3.90 cm   Diastology LVIDs:         2.50 cm   LV e' medial:    5.98 cm/s LV PW:         1.30 cm   LV E/e' medial:  14.4 LV IVS:        1.70 cm   LV e' lateral:   8.92 cm/s LVOT diam:     2.00 cm   LV E/e' lateral: 9.6 LV SV:         82 LV SV Index:   44 LVOT Area:     3.14 cm  RIGHT VENTRICLE RV Basal diam:  3.70 cm RV Mid diam:    2.40 cm RV S prime:     15.80 cm/s TAPSE (M-mode): 1.8 cm LEFT ATRIUM             Index        RIGHT ATRIUM           Index LA diam:        3.00 cm 1.61 cm/m   RA Area:     19.30 cm LA Vol (A2C):   33.9 ml 18.16 ml/m  RA Volume:   55.20 ml  29.58 ml/m LA Vol (A4C):   53.8 ml 28.83 ml/m LA Biplane Vol: 44.2 ml 23.68 ml/m  AORTIC VALVE AV Area (Vmax):    2.21 cm AV Area (Vmean):   2.19 cm AV Area (VTI):     2.60 cm AV  Vmax:           157.50 cm/s AV Vmean:          108.650 cm/s AV VTI:            0.317 m AV Peak Grad:      9.9 mmHg AV Mean Grad:      5.5 mmHg LVOT Vmax:         111.00 cm/s LVOT Vmean:        75.600 cm/s LVOT VTI:          0.262 m LVOT/AV VTI ratio: 0.83  AORTA Ao Root diam: 3.10 cm MITRAL VALVE                TRICUSPID VALVE MV Area (PHT): 2.59 cm     TR Peak grad:   15.4 mmHg MV Area VTI:   2.79 cm     TR Vmax:        196.00 cm/s MV Peak grad:  6.2 mmHg MV Mean grad:  2.0 mmHg     SHUNTS MV Vmax:       1.25 m/s     Systemic VTI:  0.26 m MV Vmean:       66.5 cm/s    Systemic Diam: 2.00 cm MV Decel Time: 293 msec MV E velocity: 85.90 cm/s MV A velocity: 116.00 cm/s MV E/A ratio:  0.74 Julien Nordmann MD Electronically signed by Julien Nordmann MD Signature Date/Time: 09/10/2022/12:01:02 PM    Final    CT Angio Head Neck W WO CM  Result Date: 09/09/2022 CLINICAL DATA:  Central vertigo, emesis EXAM: CT ANGIOGRAPHY HEAD AND NECK WITH AND WITHOUT CONTRAST TECHNIQUE: Multidetector CT imaging of the head and neck was performed using the standard protocol during bolus administration of intravenous contrast. Multiplanar CT image reconstructions and MIPs were obtained to evaluate the vascular anatomy. Carotid stenosis measurements (when applicable) are obtained utilizing NASCET criteria, using the distal internal carotid diameter as the denominator. RADIATION DOSE REDUCTION: This exam was performed according to the departmental dose-optimization program which includes automated exposure control, adjustment of the mA and/or kV according to patient size and/or use of iterative reconstruction technique. CONTRAST:  75mL OMNIPAQUE IOHEXOL 350 MG/ML SOLN COMPARISON:  11/09/2021 CT head, 08/28/2021 CTA head and neck, 09/09/2022 MRI head FINDINGS: CT HEAD FINDINGS Brain: Hypodensity in the superior right cerebellum and vermis, consistent with the acute right SCA territory infarct seen on the same-day MRI. No evidence of acute hemorrhage, mass, mass effect, or midline shift. No hydrocephalus or extra-axial fluid collection. Sequela of remote right basal ganglia infarct. Vascular: No hyperdense vessel. Skull: Negative for fracture or focal lesion. Sinuses/Orbits: No acute finding. Other: The mastoid air cells are well aerated. CTA NECK FINDINGS Aortic arch: Standard branching. Imaged portion shows no evidence of aneurysm or dissection. No significant stenosis of the major arch vessel origins. Right carotid system: No evidence of dissection, occlusion, or hemodynamically  significant stenosis (greater than 50%). Left carotid system: No evidence of dissection, occlusion, or hemodynamically significant stenosis (greater than 50%). Vertebral arteries: No evidence of dissection, occlusion, or hemodynamically significant stenosis (greater than 50%). Skeleton: No acute osseous abnormality. Degenerative changes in the cervical spine. Other neck: Negative. Upper chest: No focal pulmonary opacity or pleural effusion. Review of the MIP images confirms the above findings CTA HEAD FINDINGS Evaluation is somewhat limited by bolus timing and motion. This particularly affects the distal anterior circulation. Anterior circulation: Both internal carotid arteries are patent to the termini, without significant  stenosis. A1 segments patent. Normal anterior communicating artery. Anterior cerebral arteries are patent through the A2 segments, with more distal segments limited by motion. No M1 stenosis or occlusion. MCA branches perfused through the M2 segments, with more distal segments limited by motion. Posterior circulation: Vertebral arteries patent to the vertebrobasilar junction without significant stenosis. Posterior inferior cerebellar arteries patent proximally. Basilar patent to its distal aspect without significant stenosis. Superior cerebellar arteries patent proximally, although the right proximal SCA is decreased in caliber compared to the prior exam (series 10, image 126) Patent P1 segments. Moderate to severe stenosis in the proximal right P3 (series 10, image 114 and series 11, image 24). PCAs perfused to their distal aspects without significant stenosis. The bilateral posterior communicating arteries are not visualized. Venous sinuses: As permitted by contrast timing and motion, patent. Anatomic variants: None significant. Review of the MIP images confirms the above findings IMPRESSION: 1. Hypodensity in the superior right cerebellum and vermis, consistent with the acute right SCA  territory infarct seen on the same-day MRI. 2. Decreased caliber of proximal right SCA compared to 08/28/2021, although the vessel does appear patent proximally. 3. No intracranial large vessel occlusion. 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 4. No hemodynamically significant stenosis in the neck. These results were called by telephone at the time of interpretation on 09/09/2022 at 1:06 am to provider [facility] , who verbally acknowledged these results. Electronically Signed   By: Wiliam Ke M.D.   On: 09/09/2022 01:17   MR BRAIN WO CONTRAST  Result Date: 09/09/2022 CLINICAL DATA:  Dizziness, stroke suspected, vomiting EXAM: MRI HEAD WITHOUT CONTRAST TECHNIQUE: Multiplanar, multiecho pulse sequences of the brain and surrounding structures were obtained without intravenous contrast. COMPARISON:  08/29/2021 MRI head, correlation is also made with 11/09/2021 CT head FINDINGS: Brain: Restricted diffusion with ADC correlate in the right superior cerebellum and vermis, which correlates the right superior cerebellar artery territory. This area is associated with increased T2 hyperintense signal, likely cytotoxic edema. No evidence of hemorrhagic transformation. No acute hemorrhage, mass, mass effect, or midline shift. No hydrocephalus or extra-axial collection. Partial empty sella. Normal craniocervical junction. No hemosiderin deposition to suggest remote hemorrhage. Remote infarct in the right basal ganglia. Somewhat disproportionate volume loss in the posterior left frontal lobe (series 15, image 39). Scattered T2 hyperintense signal in the periventricular white matter, likely the sequela of chronic small vessel ischemic disease. Vascular: Normal arterial flow voids. Skull and upper cervical spine: Normal marrow signal. Sinuses/Orbits: Clear paranasal sinuses. No acute finding in the orbits. Other: The mastoid air cells are well aerated. IMPRESSION: Acute infarct in the right superior cerebellum  and vermis, which correlates with the right superior cerebellar artery territory. No evidence of hemorrhagic transformation. These results were called by telephone at the time of interpretation on 09/09/2022 at 1:06 am to provider [facility] , who verbally acknowledged these results. Electronically Signed   By: Wiliam Ke M.D.   On: 09/09/2022 01:07   DG Chest 2 View  Result Date: 09/08/2022 CLINICAL DATA:  Dizziness EXAM: CHEST - 2 VIEW COMPARISON:  08/28/2021 FINDINGS: Cardiomegaly. Mediastinal contours within normal limits. Linear left lower lung opacities, favor atelectasis. Right lung clear. No effusions or edema. No acute bony abnormality. IMPRESSION: Cardiomegaly. Linear left lower lung opacities, favor atelectasis. Electronically Signed   By: Charlett Nose M.D.   On: 09/08/2022 22:22    Assessment/Plan: Diagnosis: Acute cerebellar CVA Does the need for close, 24 hr/day medical supervision in concert with  the patient's rehab needs make it unreasonable for this patient to be served in a less intensive setting? Yes Co-Morbidities requiring supervision/potential complications:  1) Dizziness 2) visual deficits 3) nausea and vomiting 4) overweight: provide dietary education 5) hypothyroidism: continue synthroid 6) constipation: continue colace Due to bladder management, bowel management, safety, skin/wound care, disease management, medication administration, pain management, and patient education, does the patient require 24 hr/day rehab nursing? Yes Does the patient require coordinated care of a physician, rehab nurse, therapy disciplines of PT, OT to address physical and functional deficits in the context of the above medical diagnosis(es)? Yes Addressing deficits in the following areas: balance, endurance, locomotion, strength, transferring, bowel/bladder control, bathing, dressing, feeding, grooming, toileting, and psychosocial support Can the patient actively participate in an intensive  therapy program of at least 3 hrs of therapy per day at least 5 days per week? Yes The potential for patient to make measurable gains while on inpatient rehab is excellent Anticipated functional outcomes upon discharge from inpatient rehab are modified independent  with PT, modified independent with OT, modified independent with SLP. Estimated rehab length of stay to reach the above functional goals is: 10-12 days Anticipated discharge destination: Home Overall Rehab/Functional Prognosis: excellent  POST ACUTE RECOMMENDATIONS: This patient's condition is appropriate for continued rehabilitative care in the following setting: CIR Patient has agreed to participate in recommended program. Yes Note that insurance prior authorization may be required for reimbursement for recommended care.    I have personally performed a face to face diagnostic evaluation of this patient. Additionally, I have examined the patient's medical record including any pertinent labs and radiographic images. If the physician assistant has documented in this note, I have reviewed and edited or otherwise concur with the physician assistant's documentation.  Thanks,  Horton Chin, MD 09/10/2022

## 2022-09-10 NOTE — Progress Notes (Signed)
Occupational Therapy Treatment Patient Details Name: Susan Farrell MRN: [id number] DOB: 16-Oct-1945 Today's Date: 09/10/2022   History of present illness Jamilla Shingledecker is a 76yoF who comes to [facility] ED on 8/19 after acute onset N/V and dizziness. 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   OT comments  Ms. Olano was seen for OT treatment on this date. Upon arrival to room pt semi-reclined in bed, agreeable to OT Tx session. OT facilitated ADL management with education and assist as described below. See ADL section for additional details regarding occupational performance. Pt continues to be functionally limited by decreased functional use of her RUE, increased dizziness/nausea with standing/mobility, decreased balance, and decreased activity tolerance. She requires MOD A for STS and brief functional transfer in room. MIN A for UB ADL management in sitting position with intermittent support for sitting balance. Pt return verbalizes understanding of education provided t/o session. Pt is progressing toward OT goals and continues to benefit from skilled OT services to maximize return to PLOF and minimize risk of future falls, injury, caregiver burden, and readmission. Will continue to follow POC as written. Discharge recommendation remains appropriate.        If plan is discharge home, recommend the following:  Two people to help with walking and/or transfers;A lot of help with bathing/dressing/bathroom;Assistance with cooking/housework;Direct supervision/assist for medications management;Direct supervision/assist for financial management;Assist for transportation;Help with stairs or ramp for entrance   Equipment Recommendations  Other (comment) (defer to next venue of care.)    Recommendations for Other Services      Precautions / Restrictions  Precautions Precautions: Fall Restrictions Weight Bearing Restrictions: No       Mobility Bed Mobility Overal bed mobility: Modified Independent Bed Mobility: Supine to Sit (to exit toward L side of bed.)     Supine to sit: Modified independent (Device/Increase time)          Transfers Overall transfer level: Needs assistance Equipment used: Rolling walker (2 wheels) Transfers: Sit to/from Stand Sit to Stand: Mod assist           General transfer comment: Initial assist required to power to stand, able to static stand with close CGA, requires MOD A to take side steps toward EOB.     Balance Overall balance assessment: Needs assistance Sitting-balance support: Feet supported, Bilateral upper extremity supported Sitting balance-Leahy Scale: Fair     Standing balance support: Bilateral upper extremity supported, Reliant on assistive device for balance Standing balance-Leahy Scale: Poor Standing balance comment: with RW                           ADL either performed or assessed with clinical judgement   ADL Overall ADL's : Needs assistance/impaired Eating/Feeding: Sitting;Set up;Supervision/ safety   Grooming: Sitting;Minimal assistance Grooming Details (indicate cue type and reason): Min A seated EOB.                             Functional mobility during ADLs: Moderate assistance;Rolling walker (2 wheels) General ADL Comments: Able to maintain sitting balance at EOB without physical assist. Has difficulty when reaching for items with RUE, MOD A for STS from EOB at lowest position. Initial instability in standing and with forward weight shift, but pt is able to take several steps toward [facility] with MOD A  before safely lowering to sitting position. Maintains static standing balance at EOB during linen change. Pt noted to have wet bed linens from purewick malfunction. Suspect pt with decreased sensation/awareness of bowel/bladder management.     Extremity/Trunk Assessment              Vision Ability to See in Adequate Light: 1 Impaired Patient Visual Report: Blurring of vision Vision Assessment?: Yes Tracking/Visual Pursuits: Impaired - to be further tested in functional context;Other (comment)   Perception     Praxis      Cognition Arousal: Alert Behavior During Therapy: WFL for tasks assessed/performed Overall Cognitive Status: Within Functional Limits for tasks assessed                                 General Comments: A+Ox4. Agreeable, motivated to "do as much as I can".        Exercises Other Exercises Other Exercises: Pt educated on safety, falls prevention, DC recs and compensatory ADL management strategies during functional activity as described above.    Shoulder Instructions       General Comments      Pertinent Vitals/ Pain       Pain Assessment Pain Assessment: No/denies pain  Home Living                                          Prior Functioning/Environment              Frequency  Min 1X/week        Progress Toward Goals  OT Goals(current goals can now be found in the care plan section)  Progress towards OT goals: Progressing toward goals  Acute Rehab OT Goals Patient Stated Goal: To get better OT Goal Formulation: With patient Time For Goal Achievement: 09/23/22 Potential to Achieve Goals: Good  Plan      Co-evaluation                 AM-PAC OT "6 Clicks" Daily Activity     Outcome Measure   Help from another person eating meals?: A Little Help from another person taking care of personal grooming?: A Little Help from another person toileting, which includes using toliet, bedpan, or urinal?: A Lot Help from another person bathing (including washing, rinsing, drying)?: A Lot Help from another person to put on and taking off regular upper body clothing?: A Little Help from another person to put on and taking off regular lower  body clothing?: A Lot 6 Click Score: 15    End of Session Equipment Utilized During Treatment: Rolling walker (2 wheels);Gait belt  OT Visit Diagnosis: Unsteadiness on feet (R26.81);Dizziness and giddiness (R42);Other (comment)   Activity Tolerance Patient tolerated treatment well;No increased pain   Patient Left in bed (with PT in room to begin session)   Nurse Communication          Time: 9528-4132 OT Time Calculation (min): 27 min  Charges: OT General Charges $OT Visit: 1 Visit OT Treatments $Self Care/Home Management : 23-37 mins  Rockney Ghee, M.S., OTR/L 09/10/22, 4:09 PM

## 2022-09-10 NOTE — PMR Pre-admission (Signed)
PMR Admission Coordinator Pre-Admission Assessment  Patient: Susan Farrell is an 77 y.o., female MRN: [id number] DOB: 02/21/1945 Height: 5\' 6"  (167.6 cm) Weight: 77.1 kg  Insurance Information HMO:     PPO: yes     PCP:      IPA:      80/20:      OTHER:  PRIMARY: [employer]      Policy#: [id number]      Subscriber: patient CM Name: D. Steinaway      Phone#: [phone] ext [phone]     Fax#: [phone] Pre-Cert#: [id number] auth fax received 09/11/22, good for 5 days.  Once admitted update weekly to Danella Penton at fax [phone] and office # [phone] ext [phone]       Employer: Retired Benefits:  Phone #: [phone]     Name: On line at [email or web address] Eff Date: 01/20/2022- still active   Deductible: does not have one   OOP Max: $3,600 ($360 met)   CIR: $295/day co-pay with a max co-pay of $2,065/admission (7 days)   SNF: $20/day co-pay for days 1-20, $203/day co-pay for days 21-100, limited to 100 days/cal yr   Outpatient:  $25 copay/visit Home Health:  100% coverage   DME: 80% coverage; 20% co-insurance   Providers: in network   SECONDARY: [employer]   Policy#: [id number]     Phone#: [phone]   Financial Counselor:       Phone#:   The "Data Collection Information Summary" for patients in Inpatient Rehabilitation Facilities with attached "Privacy Act Statement-Health Care Records" was provided and verbally reviewed with: Patient  Emergency Contact Information Contact Information     Name Relation Home Work Mobile   Loraine Grip Spouse [phone]  [phone]      Other Contacts   None on File     Current Medical History  Patient Admitting Diagnosis: CVA    History of Present Illness: Susan Farrell is a 77 y.o. 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  Onset was at 5:30 PM on 09/08/2022.  She had associated nausea and went on to have 3 episodes of nonbloody nonbilious vomiting.  Since the onset she has continued to feel dizzy and whereas previously she could stand with the assistance of her cane she is unable to maintain her balance when standing and has to remain seated.  She was previously in her usual state of health.  She denies headache or visual disturbance.  She has no diarrhea, dysuria, abdominal pain, fever or chills.  Patient arrived to the ED at 8:55 PM however was not evaluated until later on around 11 PM when concern arose for possible CVA. In the ED, vitals within normal limits, CBC, BMP, TSH, troponin mostly unremarkable except for mild anemia with hemoglobin of 10.1,  which is about her baseline. EKG Showed sinus at 70 with T wave inversion in anterolateral leads.CTA head and neck and MRI were ordered which were consistent with stroke. MRI  showed Acute infarct in the right superior cerebellum and vermis, which correlates with the right superior cerebellar artery territory. No evidence of hemorrhagic transformation. Pt was seen by PT/OT and They recommend CIR to assist return to PLOF.     By the time  of the imaging report, patient was well out of the window for thrombolytic therapy. Complete NIHSS TOTAL: 1  Patient's medical record from [facility] has been reviewed by the rehabilitation admission coordinator and physician.  Past Medical History  Past Medical History:  Diagnosis Date   Hypertension    Stroke [facility])     Has the patient had major surgery during 100 days prior to admission? No  Family History   family history includes Diabetes in her sister; Hypertension in her father and mother.  Current Medications  Current Facility-Administered Medications:     acetaminophen (TYLENOL) tablet 650 mg, 650 mg, Oral, Q4H PRN, 650 mg at 09/09/22 0239 **OR** acetaminophen (TYLENOL) 160 MG/5ML solution 650 mg, 650 mg, Per Tube, Q4H PRN **OR** acetaminophen (TYLENOL) suppository 650 mg, 650 mg, Rectal, Q4H PRN, Andris Baumann, MD   aspirin EC tablet 81 mg, 81 mg, Oral, Daily, Renae Gloss, Richard, MD, 81 mg at 09/11/22 1059   calcium-vitamin D (OSCAL WITH D) 500-5 MG-MCG per tablet 1 tablet, 1 tablet, Oral, Daily, Wieting, Richard, MD, 1 tablet at 09/11/22 1059   [COMPLETED] clopidogrel (PLAVIX) tablet 300 mg, 300 mg, Oral, Once, 300 mg at 09/09/22 1401 **FOLLOWED BY** clopidogrel (PLAVIX) tablet 75 mg, 75 mg, Oral, Daily, Bhagat, Srishti L, MD, 75 mg at 09/11/22 1059   docusate sodium (COLACE) capsule 100 mg, 100 mg, Oral, Daily, Wieting, Richard, MD, 100 mg at 09/11/22 1059   enoxaparin (LOVENOX) injection 40 mg, 40 mg, Subcutaneous, Q24H, Lindajo Royal V, MD, 40 mg at 09/11/22 1101   iron polysaccharides (NIFEREX) capsule 150 mg, 150 mg, Oral, Daily, Wieting, Richard, MD, 150 mg at 09/11/22 1100   levothyroxine (SYNTHROID) tablet 100 mcg, 100 mcg, Oral, Q0600, Alford Highland, MD, 100 mcg at 09/11/22 1914   multivitamin with minerals tablet 1 tablet, 1 tablet, Oral, Daily, Wieting, Richard, MD, 1 tablet at 09/11/22 1059   ondansetron (ZOFRAN) injection 4 mg, 4 mg, Intravenous, Q6H PRN, Renae Gloss, Richard, MD, 4 mg at 09/09/22 1717   rosuvastatin (CRESTOR) tablet 40 mg, 40 mg, Oral, Daily, Wieting, Richard, MD, 40 mg at 09/11/22 1059   varenicline (CHANTIX) tablet 1 mg, 1 mg, Oral, BID, Wieting, Richard, MD, 1 mg at 09/11/22 1100  Patients Current Diet:  Diet Order             Diet Heart Room service appropriate? Yes; Fluid consistency: Thin  Diet effective now                   Precautions / Restrictions Precautions Precautions: Fall Restrictions Weight Bearing Restrictions: No   Has the patient had 2 or more falls or a fall with injury in the  past year? No  Prior Activity Level Community (5-7x/wk): Pt. acitve in the commuinity PTA  Prior Functional Level Self Care: Did the patient need help bathing, dressing, using the toilet or eating? Independent  Indoor Mobility: Did the patient need assistance with walking from room to room (with or without device)? Independent  Stairs: Did the patient need assistance with internal or external stairs (with or without device)? Independent  Functional Cognition: Did the patient need help planning regular tasks such as shopping or remembering to take medications? Independent  Patient Information Are you of Hispanic, Latino/a,or Spanish origin?: A. No, not of Hispanic, Latino/a, or Spanish origin What is your race?: B. Black or African American Do you need or want an interpreter to communicate with a doctor or health care staff?: 0. No  Patient's Response To:  Health Literacy and Transportation Is the patient able to respond to health literacy and transportation needs?: Yes Health Literacy - How often do you need to have someone help you when you read instructions, pamphlets, or other written material from your doctor or pharmacy?: Never In the past 12 months, has lack of transportation kept you from medical appointments or from getting medications?: No In the past 12 months, has lack of transportation kept you from meetings, work, or from getting things needed for daily living?: No  Journalist, newspaper / Equipment Home Equipment: Shower seat - built in, Agricultural consultant (2 wheels), [employer] - quad  Prior Device Use: Indicate devices/aids used by the patient prior to current illness, exacerbation or injury? None of the above  Current Functional Level Cognition  Overall Cognitive Status: Within Functional Limits for tasks assessed Orientation Level: Oriented X4 General Comments: A+Ox4. Agreeable, motivated to "do as much as I can".    Extremity Assessment (includes  Sensation/Coordination)  Upper Extremity Assessment: Overall WFL for tasks assessed, Right hand dominant  Lower Extremity Assessment: Defer to PT evaluation (Pt has baseline RLE deficits from prior CVA)    ADLs  Overall ADL's : Needs assistance/impaired Eating/Feeding: Sitting, Set up, Supervision/ safety Eating/Feeding Details (indicate cue type and reason): anticipate supervision, bed level only Grooming: Sitting, Minimal assistance Grooming Details (indicate cue type and reason): brushing, applying dentures in sitting; MIN A for bimanual tasks Upper Body Bathing Details (indicate cue type and reason): anticipate MIN A bed level Lower Body Bathing Details (indicate cue type and reason): anticipate MAX A bed level with back support Upper Body Dressing Details (indicate cue type and reason): anticipate MIN A seated (balance deficit) Lower Body Dressing Details (indicate cue type and reason): anticipate MAX A due to standing balance deficit (pt able to make nearly figure four, but not quite able to get BIL LE over opposite knee; particular difficulty with LLE given baseline weakenss and need for BIL UE to assist with moving LE, meaning UE not available to assist with sitting balance). Toilet Transfer: Minimal assistance, Rolling walker (2 wheels), Moderate assistance Toilet Transfer Details (indicate cue type and reason): short amb transfer with RW Toileting- Clothing Manipulation and Hygiene: Moderate assistance, Sit to/from stand Toileting - Clothing Manipulation Details (indicate cue type and reason): underwear Tub/Shower Transfer Details (indicate cue type and reason): not yet safe for tub transfer given dizziness/decreased balance Functional mobility during ADLs: Moderate assistance, Rolling walker (2 wheels) General ADL Comments: Able to maintain sitting balance at EOB without physical assist. Has difficulty when reaching for items with RUE, MOD A for STS from EOB at lowest position. Initial  instability in standing and with forward weight shift, but pt is able to take several steps toward [facility] with MOD A before safely lowering to sitting position. Maintains static standing balance at EOB during linen change. Pt noted to have wet bed linens from purewick malfunction. Suspect pt with decreased sensation/awareness of bowel/bladder management.    Mobility  Overal bed mobility: Modified Independent Bed Mobility: Supine to Sit, Sit to Supine Supine to sit: Modified independent (Device/Increase time) Sit to supine: Modified independent (Device/Increase time) General bed mobility comments: moving better than AM session    Transfers  Overall transfer level: Needs assistance Equipment used: Rolling walker (2 wheels) Transfers: Sit to/from Stand Sit to Stand: Supervision, Contact guard assist General transfer comment: 4x from recliner in session, 1x from EOB    Ambulation / Gait / Stairs /  Wheelchair Mobility  Ambulation/Gait Ambulation/Gait assistance: Contact guard assist, Max assist, +2 safety/equipment (intermittent maxA for LOB recovery 5-6x over 2 walks, otherwise steady with RW and concerted effort) Gait Distance (Feet): 15 Feet (12', 15', 12') Assistive device: Rolling walker (2 wheels) Gait Pattern/deviations: Step-to pattern General Gait Details: AMB in hallway with chair follow; more limited by persistent sleepy state today, but no LOB that warranted author to intervene for recovery or stabilization.    Posture / Balance Dynamic Sitting Balance Sitting balance - Comments: dynamic sitting balance tasks via ADLs Balance Overall balance assessment: Needs assistance Sitting-balance support: Feet supported, Bilateral upper extremity supported Sitting balance-Leahy Scale: Fair Sitting balance - Comments: dynamic sitting balance tasks via ADLs Standing balance support: Bilateral upper extremity supported, Reliant on assistive device for balance Standing balance-Leahy Scale:  Poor Standing balance comment: with RW    Special needs/care consideration Bladder incontinence, External Urinary Catheter, Skin: Abrasion-generalized, Ecchymosis   Previous Home Environment (from acute therapy documentation) Living Arrangements: Spouse/significant other Available Help at Discharge: Family Type of Home: House Home Layout: One level Home Access: Stairs to enter Entrance Stairs-Rails: None Entrance Stairs-Number of Steps: 2-3 Bathroom Shower/Tub: Health visitor: Handicapped height Home Care Services: No  Discharge Living Setting Plans for Discharge Living Setting: Patient's home Type of Home at Discharge: House Discharge Home Layout: One level Discharge Home Access: Stairs to enter Entrance Stairs-Rails: None Entrance Stairs-Number of Steps: 2-3 Discharge Bathroom Shower/Tub: Walk-in shower Discharge Bathroom Toilet: Handicapped height Discharge Bathroom Accessibility: Yes Does the patient have any problems obtaining your medications?: No  Social/Family/Support Systems Patient Roles: Spouse Contact Information: [phone] Anticipated Caregiver: 24/7 Ability/Limitations of Caregiver: Min A Caregiver Availability: 24/7 Discharge Plan Discussed with Primary Caregiver: Yes Is Caregiver In Agreement with Plan?: Yes Does Caregiver/Family have Issues with Lodging/Transportation while Pt is in Rehab?: No  Goals Patient/Family Goal for Rehab: PT/OT Mod I Expected length of stay: 7-10 daus Pt/Family Agrees to Admission and willing to participate: Yes Program Orientation Provided & Reviewed with Pt/Caregiver Including Roles  & Responsibilities: Yes  Decrease burden of Care through IP rehab admission: not anticipated  Possible need for SNF placement upon discharge: not anticipated  Patient Condition: I have reviewed medical records from [facility], spoken with CM, and patient and spouse. I discussed via phone for inpatient  rehabilitation assessment.  Patient will benefit from ongoing PT, OT, and SLP, can actively participate in 3 hours of therapy a day 5 days of the week, and can make measurable gains during the admission.  Patient will also benefit from the coordinated team approach during an Inpatient Acute Rehabilitation admission.  The patient will receive intensive therapy as well as Rehabilitation physician, nursing, social worker, and care management interventions.  Due to safety, skin/wound care, disease management, medication administration, pain management, and patient education the patient requires 24 hour a day rehabilitation nursing.  The patient is currently Supervision-Contact G with mobility and Min-Mod  A with basic ADLs.  Discharge setting and therapy post discharge at home with home health is anticipated.  Patient has agreed to participate in the Acute Inpatient Rehabilitation Program and will admit today.  Preadmission Screen Completed By:  Trish Mage, with updates by Wolfgang Phoenix 09/11/2022 3:56 PM ______________________________________________________________________   Discussed status with Dr. Shearon Stalls on 09/12/22  at 9:48 AM and received approval for admission today.  Admission Coordinator:  Trish Mage, RN, time 9:48 AM/Date 09/12/22    Assessment/Plan: Diagnosis: CVA of the right  SCA effecting the superior cerebellum and vermis  Does the need for close, 24 hr/day Medical supervision in concert with the patient's rehab needs make it unreasonable for this patient to be served in a less intensive setting? Yes Co-Morbidities requiring supervision/potential complications: hypertension, discoid lupus, hypothyroidism, AAA status post rupture and repair, tobacco abuse, Dizziness, visual deficits, nausea and vomiting, hypothyroidism, urinary incontinence and constipation  Due to bladder management, bowel management, safety, skin/wound care, disease management, medication administration,  pain management, and patient education, does the patient require 24 hr/day rehab nursing? Yes Does the patient require coordinated care of a physician, rehab nurse, PT, OT, and SLP to address physical and functional deficits in the context of the above medical diagnosis(es)? Yes Addressing deficits in the following areas: balance, endurance, locomotion, strength, transferring, bowel/bladder control, bathing, dressing, feeding, grooming, toileting, and cognition Can the patient actively participate in an intensive therapy program of at least 3 hrs of therapy 5 days a week? Yes The potential for patient to make measurable gains while on inpatient rehab is good Anticipated functional outcomes upon discharge from inpatient rehab: modified independent PT, modified independent OT, modified independent SLP Estimated rehab length of stay to reach the above functional goals is: 10-14 days Anticipated discharge destination: Home 10. Overall Rehab/Functional Prognosis: good   MD Signature:  Angelina Sheriff, DO 09/12/2022

## 2022-09-10 NOTE — Progress Notes (Signed)
*  PRELIMINARY RESULTS* Echocardiogram 2D Echocardiogram has been performed.  Cristela Blue 09/10/2022, 7:49 AM

## 2022-09-10 NOTE — Plan of Care (Signed)
Briefly seen, patient reports symptoms are improving, no new complaints.   Brooke Dare MD-PhD [facility] [phone] [facility] coverage for [facility] is from 8 AM to 4 AM in-house and 4 PM to 8 PM by telephone/video. 8 PM to 8 AM emergent questions or overnight urgent questions should be addressed to Teleneurology On-call or [facility] neurohospitalist; contact information can be found on AMION

## 2022-09-10 NOTE — Progress Notes (Signed)
Physical Therapy Treatment Patient Details Name: Susan Farrell MRN: [id number] DOB: 1945/12/24 Today's Date: 09/10/2022   History of Present Illness Susan Farrell is a 76yoF who comes to [facility] ED on 8/19 after acute onset N/V and dizziness. 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    PT Comments  Pt in bed on entry, more alert/awake today. Pt reports nausea getting much better, far less visual disturbance today, she says able to see TV imaging without any distortion. Pt has not resting nystagmus with fixed gaze, but has aberrant smooth pursuits to right with 3 beats. Pt struggles with postural control in sitting and standing, recurrent falling toward the right side, author facilitates opportunities to safely practice control and correction. Able to commence some step training at EOB, but finished up after so pt could visit with family that had only just arrived.    If plan is discharge home, recommend the following: A lot of help with walking and/or transfers;A lot of help with bathing/dressing/bathroom;Help with stairs or ramp for entrance   Can travel by private vehicle        Equipment Recommendations  None recommended by PT    Recommendations for Other Services Rehab consult     Precautions / Restrictions Precautions Precautions: Fall Restrictions Weight Bearing Restrictions: No     Mobility  Bed Mobility Overal bed mobility: Modified Independent Bed Mobility: Supine to Sit     Supine to sit: Modified independent (Device/Increase time)     General bed mobility comments: intermittent abberant trunk control upon coming to EOB, stabilizes    Transfers Overall transfer level: Needs assistance Equipment used: Rolling walker (2 wheels) Transfers: Sit to/from Stand Sit to Stand: Min assist           General transfer comment: improve  from previous day, but still sturggles with postural alignment with midline and control (right sided weakness)    Ambulation/Gait   Gait Distance (Feet): 20 Feet Assistive device: Rolling walker (2 wheels) Gait Pattern/deviations: Step-to pattern       General Gait Details: Alternating fwd, backward at bedside; severe difficulty with trunk control in midline, frequent LOB to right side, requires maxA 50% of these for steady. Poor awareness and selection of stance width.   Stairs             Wheelchair Mobility     Tilt Bed    Modified Rankin (Stroke Patients Only)       Balance                                            Cognition                                                Exercises      General Comments        Pertinent Vitals/Pain Pain Assessment Pain Assessment: No/denies pain    Home Living                          Prior Function            PT Goals (  current goals can now be found in the care plan section) Acute Rehab PT Goals Patient Stated Goal: regain functional independence and activity tolerance PT Goal Formulation: With patient Time For Goal Achievement: 09/23/22 Potential to Achieve Goals: Good Progress towards PT goals: Progressing toward goals    Frequency    Min 1X/week      PT Plan      Co-evaluation              AM-PAC PT "6 Clicks" Mobility   Outcome Measure  Help needed turning from your back to your side while in a flat bed without using bedrails?: A Little Help needed moving from lying on your back to sitting on the side of a flat bed without using bedrails?: A Little Help needed moving to and from a bed to a chair (including a wheelchair)?: A Lot Help needed standing up from a chair using your arms (e.g., wheelchair or bedside chair)?: A Lot Help needed to walk in hospital room?: A Lot Help needed climbing 3-5 steps with a railing? : A Lot 6 Click Score:  14    End of Session   Activity Tolerance: Patient tolerated treatment well Patient left: in chair;with family/visitor present;with call bell/phone within reach Nurse Communication: Mobility status PT Visit Diagnosis: Difficulty in walking, not elsewhere classified (R26.2);Other abnormalities of gait and mobility (R26.89);Unsteadiness on feet (R26.81);Dizziness and giddiness (R42)     Time: 2536-6440 PT Time Calculation (min) (ACUTE ONLY): 19 min  Charges:    $Neuromuscular Re-education: 8-22 mins PT General Charges $$ ACUTE PT VISIT: 1 Visit                    11:37 AM, 09/10/22 [name], PT, DPT Physical Therapist - [facility]  [phone] (ASCOM)    [name] 09/10/2022, 11:34 AM

## 2022-09-11 DIAGNOSIS — I639 Cerebral infarction, unspecified: Secondary | ICD-10-CM | POA: Diagnosis not present

## 2022-09-11 NOTE — Progress Notes (Signed)
[facility] IP rehab admissions - We have just received approval for acute inpatient rehab admission.  I will have my partner, Lauren, follow up tomorrow for bed availability and potential CIR admission.  Call for questions.  [phone]

## 2022-09-11 NOTE — Progress Notes (Signed)
[facility]  - [place] [facility]   PATIENT NAME: Susan Farrell    MR#:  [id number]  DATE OF BIRTH:  02-01-1945  SUBJECTIVE:   No family at bedside. Patient denies any focal weakness. Has some dizziness    VITALS:  Blood pressure 126/75, pulse 74, temperature 98.2 F (36.8 C), resp. rate 16, height 5\' 6"  (1.676 m), weight 77.1 kg, SpO2 100%.  PHYSICAL EXAMINATION:   GENERAL:  77 y.o.-year-old patient with no acute distress.  LUNGS: Normal breath sounds bilaterally, no wheezing CARDIOVASCULAR: S1, S2 normal. No murmur   ABDOMEN: Soft, nontender, nondistended. EXTREMITIES: No  edema b/l.    NEUROLOGIC: nonfocal  patient is alert and awake  LABORATORY PANEL:  CBC Recent Labs  Lab 09/10/22 0508  WBC 4.0  HGB 9.3*  HCT 30.3*  PLT 199    Chemistries  Recent Labs  Lab 09/10/22 0508  NA 138  K 3.4*  CL 106  CO2 24  GLUCOSE 85  BUN 10  CREATININE 0.82  CALCIUM 8.4*    RADIOLOGY:  ECHOCARDIOGRAM COMPLETE  Result Date: 09/10/2022    ECHOCARDIOGRAM REPORT   Patient Name:   Susan Farrell Date of Exam: 09/10/2022 Medical Rec #:  [id number]        Height:       66.0 in Accession #:    [id number]       Weight:       170.0 lb Date of Birth:  01/03/1946        BSA:          1.866 m Patient Age:    76 years         BP:           146/63 mmHg Patient Gender: F                HR:           67 bpm. Exam Location:  [facility] Procedure: 2D Echo, Cardiac Doppler, Color Doppler and Saline Contrast Bubble            Study Indications:     Stroke I63.9  History:         Patient has prior history of Echocardiogram examinations, most                  recent 08/29/2021. Stroke; Risk Factors:Hypertension.  Sonographer:     Cristela Blue Referring Phys:  [id number] Gordy Councilman Diagnosing Phys: Julien Nordmann MD IMPRESSIONS  1. Left ventricular ejection fraction, by estimation, is 60 to 65%. The left ventricle has normal function. The left ventricle has no regional wall motion  abnormalities. There is moderate left ventricular hypertrophy. Left ventricular diastolic parameters are consistent with Grade I diastolic dysfunction (impaired relaxation).  2. Right ventricular systolic function is normal. The right ventricular size is normal. There is normal pulmonary artery systolic pressure. The estimated right ventricular systolic pressure is 20.4 mmHg.  3. The mitral valve is normal in structure. Mild mitral valve regurgitation. No evidence of mitral stenosis.  4. The aortic valve is normal in structure. There is mild calcification of the aortic valve. Aortic valve regurgitation is not visualized. Aortic valve sclerosis is present, with no evidence of aortic valve stenosis.  5. The inferior vena cava is normal in size with greater than 50% respiratory variability, suggesting right atrial pressure of 3 mmHg.  6. Agitated saline contrast bubble study was negative, with no evidence of any interatrial shunt. FINDINGS  Left  Ventricle: Left ventricular ejection fraction, by estimation, is 60 to 65%. The left ventricle has normal function. The left ventricle has no regional wall motion abnormalities. The left ventricular internal cavity size was normal in size. There is  moderate left ventricular hypertrophy. Left ventricular diastolic parameters are consistent with Grade I diastolic dysfunction (impaired relaxation). Right Ventricle: The right ventricular size is normal. No increase in right ventricular wall thickness. Right ventricular systolic function is normal. There is normal pulmonary artery systolic pressure. The tricuspid regurgitant velocity is 1.96 m/s, and  with an assumed right atrial pressure of 5 mmHg, the estimated right ventricular systolic pressure is 20.4 mmHg. Left Atrium: Left atrial size was normal in size. Right Atrium: Right atrial size was normal in size. Pericardium: There is no evidence of pericardial effusion. Mitral Valve: The mitral valve is normal in structure. There  is mild calcification of the mitral valve leaflet(s). Mild mitral valve regurgitation. No evidence of mitral valve stenosis. MV peak gradient, 6.2 mmHg. The mean mitral valve gradient is 2.0 mmHg. Tricuspid Valve: The tricuspid valve is normal in structure. Tricuspid valve regurgitation is mild . No evidence of tricuspid stenosis. Aortic Valve: The aortic valve is normal in structure. There is mild calcification of the aortic valve. Aortic valve regurgitation is not visualized. Aortic valve sclerosis is present, with no evidence of aortic valve stenosis. Aortic valve mean gradient  measures 5.5 mmHg. Aortic valve peak gradient measures 9.9 mmHg. Aortic valve area, by VTI measures 2.60 cm. Pulmonic Valve: The pulmonic valve was normal in structure. Pulmonic valve regurgitation is not visualized. No evidence of pulmonic stenosis. Aorta: The aortic root is normal in size and structure. Venous: The inferior vena cava is normal in size with greater than 50% respiratory variability, suggesting right atrial pressure of 3 mmHg. IAS/Shunts: No atrial level shunt detected by color flow Doppler. Agitated saline contrast was given intravenously to evaluate for intracardiac shunting. Agitated saline contrast bubble study was negative, with no evidence of any interatrial shunt. There  is no evidence of a patent foramen ovale. There is no evidence of an atrial septal defect.  LEFT VENTRICLE PLAX 2D LVIDd:         3.90 cm   Diastology LVIDs:         2.50 cm   LV e' medial:    5.98 cm/s LV PW:         1.30 cm   LV E/e' medial:  14.4 LV IVS:        1.70 cm   LV e' lateral:   8.92 cm/s LVOT diam:     2.00 cm   LV E/e' lateral: 9.6 LV SV:         82 LV SV Index:   44 LVOT Area:     3.14 cm  RIGHT VENTRICLE RV Basal diam:  3.70 cm RV Mid diam:    2.40 cm RV S prime:     15.80 cm/s TAPSE (M-mode): 1.8 cm LEFT ATRIUM             Index        RIGHT ATRIUM           Index LA diam:        3.00 cm 1.61 cm/m   RA Area:     19.30 cm LA Vol  (A2C):   33.9 ml 18.16 ml/m  RA Volume:   55.20 ml  29.58 ml/m LA Vol (A4C):   53.8 ml 28.83 ml/m LA Biplane  Vol: 44.2 ml 23.68 ml/m  AORTIC VALVE AV Area (Vmax):    2.21 cm AV Area (Vmean):   2.19 cm AV Area (VTI):     2.60 cm AV Vmax:           157.50 cm/s AV Vmean:          108.650 cm/s AV VTI:            0.317 m AV Peak Grad:      9.9 mmHg AV Mean Grad:      5.5 mmHg LVOT Vmax:         111.00 cm/s LVOT Vmean:        75.600 cm/s LVOT VTI:          0.262 m LVOT/AV VTI ratio: 0.83  AORTA Ao Root diam: 3.10 cm MITRAL VALVE                TRICUSPID VALVE MV Area (PHT): 2.59 cm     TR Peak grad:   15.4 mmHg MV Area VTI:   2.79 cm     TR Vmax:        196.00 cm/s MV Peak grad:  6.2 mmHg MV Mean grad:  2.0 mmHg     SHUNTS MV Vmax:       1.25 m/s     Systemic VTI:  0.26 m MV Vmean:      66.5 cm/s    Systemic Diam: 2.00 cm MV Decel Time: 293 msec MV E velocity: 85.90 cm/s MV A velocity: 116.00 cm/s MV E/A ratio:  0.74 Julien Nordmann MD Electronically signed by Julien Nordmann MD Signature Date/Time: 09/10/2022/12:01:02 PM    Final     Assessment and Plan 77 y.o. 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  Onset was at 5:30 PM on 09/08/2022.  She had associated nausea and went on to have 3 episodes of nonbloody nonbilious vomiting.  Since the onset she has continued to feel dizzy and whereas previously she could stand with the assistance of her cane she is unable to maintain her balance when standing and has to remain seated   MRI showing right superior cerebellar stroke. CT angio showing possibility of a right ICA aneurysm.   Cerebellar stroke, acute (HCC) History of right basal ganglia stroke with residual left  hemiparesis --Patient given 300 mg of Plavix and then will do 75 mg of Plavix after that.  -- cont aspirin. --  Patient on Crestor 40 mg daily.  LDL 52.   --PT recommending rehab (possible candidate for CIR)--evaluated by Dr Ty Hilts    Hypothyroidism --Continue levothyroxine   Thyroid nodule --Will need outpatient thyroid ultrasound   Tobacco abuse --On Chantix   Anemia of chronic disease --Hemoglobin 9.7.   Discoid lupus --No acute issues   HTN (hypertension) --Hold antihypertensives to allow for permissive hypertension   History of abdominal aortic aneurysm (AAA) repair --No acute issues suspected   Family communication :none today Consults : neurology CODE STATUS: full DVT Prophylaxis : enoxaparin Level of care: Progressive Status is: Inpatient Remains inpatient appropriate because: acute CVA.  Awaiting acute inpatient rehab bed availability and transfer. Patient otherwise medically stable    TOTAL TIME TAKING CARE OF THIS PATIENT: 35 minutes.  >50%  time spent on counselling and coordination of care  Note: This dictation was prepared with Dragon dictation along with smaller phrase technology. Any transcriptional errors that result from this process are unintentional.  Enedina Finner M.D    [facility]   CC: Primary care physician; Barbette Reichmann, MD

## 2022-09-11 NOTE — Progress Notes (Signed)
Physical Therapy Treatment Patient Details Name: Susan Farrell MRN: [id number] DOB: March 05, 1945 Today's Date: 09/11/2022   History of Present Illness Susan Farrell is a 76yoF who comes to [facility] ED on 8/19 after acute onset N/V and dizziness. 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    PT Comments  Pt asleep in bed on entry, reports being extra sleepy/tired today, wants to know if any of her (dizzy) meds are contributing to this. Pt agreeable to session. No physical assist needed in session today. Equilibrium remains off, particularly with EOB sitting and any attempts to mobilize in standing. Pt is limited AMB distance today due to fatigue, but has far fewer total LOB in session that she requires physical assistance for. Pt reports to be having equal weakness in bilat legs today (old left hemi and new right symptoms) whereas she previously was c/p substantial RLE weakness issues. Will continue to follow.    If plan is discharge home, recommend the following: A lot of help with walking and/or transfers;A lot of help with bathing/dressing/bathroom;Help with stairs or ramp for entrance   Can travel by private vehicle        Equipment Recommendations  None recommended by PT    Recommendations for Other Services Rehab consult     Precautions / Restrictions Precautions Precautions: Fall Restrictions Weight Bearing Restrictions: No     Mobility  Bed Mobility Overal bed mobility: Modified Independent Bed Mobility: Supine to Sit, Sit to Supine     Supine to sit: Modified independent (Device/Increase time) Sit to supine: Modified independent (Device/Increase time)        Transfers Overall transfer level: Needs assistance Equipment used: Rolling walker (2 wheels) Transfers: Sit to/from Stand Sit to Stand: Supervision, Contact guard assist            General transfer comment: 4x from recliner in session, 1x from EOB    Ambulation/Gait   Gait Distance (Feet): 15 Feet (12', 15', 12') Assistive device: Rolling walker (2 wheels) Gait Pattern/deviations: Step-to pattern       General Gait Details: AMB in hallway with chair follow; more limited by persistent sleepy state today, but no LOB that warranted author to intervene for recovery or stabilization.   Stairs             Wheelchair Mobility     Tilt Bed    Modified Rankin (Stroke Patients Only)       Balance                                            Cognition                                                Exercises      General Comments General comments (skin integrity, edema, etc.): HR 90 bpm following mobility      Pertinent Vitals/Pain Pain Assessment Pain Assessment: No/denies pain    Home Living                          Prior Function  PT Goals (current goals can now be found in the care plan section) Acute Rehab PT Goals Patient Stated Goal: regain functional independence and activity tolerance PT Goal Formulation: With patient Time For Goal Achievement: 09/23/22 Potential to Achieve Goals: Good Progress towards PT goals: Progressing toward goals    Frequency    Min 1X/week      PT Plan      Co-evaluation              AM-PAC PT "6 Clicks" Mobility   Outcome Measure  Help needed turning from your back to your side while in a flat bed without using bedrails?: A Little Help needed moving from lying on your back to sitting on the side of a flat bed without using bedrails?: A Little Help needed moving to and from a bed to a chair (including a wheelchair)?: A Little Help needed standing up from a chair using your arms (e.g., wheelchair or bedside chair)?: A Little Help needed to walk in hospital room?: A Lot Help needed climbing 3-5 steps with a railing? : A Lot 6  Click Score: 16    End of Session Equipment Utilized During Treatment: Gait belt Activity Tolerance: Patient tolerated treatment well;No increased pain Patient left: with call bell/phone within reach;in bed;with bed alarm set   PT Visit Diagnosis: Difficulty in walking, not elsewhere classified (R26.2);Other abnormalities of gait and mobility (R26.89);Unsteadiness on feet (R26.81);Dizziness and giddiness (R42)     Time: 7616-0737 PT Time Calculation (min) (ACUTE ONLY): 28 min  Charges:    $Neuromuscular Re-education: 23-37 mins PT General Charges $$ ACUTE PT VISIT: 1 Visit                    3:51 PM, 09/11/22 [name], PT, DPT Physical Therapist - [facility]  [phone] (ASCOM)    Danford Tat C 09/11/2022, 3:47 PM

## 2022-09-11 NOTE — Progress Notes (Addendum)
Occupational Therapy Treatment Patient Details Name: Susan Farrell MRN: [id number] DOB: Jun 05, 1945 Today's Date: 09/11/2022   History of present illness Susan Farrell is a 76yoF who comes to [facility] ED on 8/19 after acute onset N/V and dizziness. 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   OT comments  Chart reviewed to date, pt greeted in bed, agreeable to OT tx session targeting improving functional mobility in prep for shower/toilet transfers via seated dynamic sitting balance ADL tasks and task oriented training. Improvements noted in seated dynamic sitting tasks with pt able to sustain and perform for approx 10 minutes before requiring a rest beak/back support. Pt provided compensatory techniques for visual changes with noted improved weight shifting in standing with RW and while seated to midline. Pt continues to require frequent vcs and MIN-MOD A for facilitation in standing, supervision for sitting. Pt is making progress towards goals, OT will continue to follow acutely.       If plan is discharge home, recommend the following:  A lot of help with bathing/dressing/bathroom;Assistance with cooking/housework;Direct supervision/assist for medications management;Direct supervision/assist for financial management;Assist for transportation;Help with stairs or ramp for entrance;A lot of help with walking and/or transfers   Equipment Recommendations  Other (comment) (per next venue of care)    Recommendations for Other Services      Precautions / Restrictions Precautions Precautions: Fall Restrictions Weight Bearing Restrictions: No       Mobility Bed Mobility Overal bed mobility: Modified Independent                  Transfers Overall transfer level: Needs assistance Equipment used: Rolling walker (2 wheels) Transfers: Sit to/from Stand Sit  to Stand: Mod assist                 Balance Overall balance assessment: Needs assistance Sitting-balance support: Feet supported, Bilateral upper extremity supported Sitting balance-Leahy Scale: Fair Sitting balance - Comments: dynamic sitting balance tasks via ADLs   Standing balance support: Bilateral upper extremity supported, Reliant on assistive device for balance Standing balance-Leahy Scale: Poor                             ADL either performed or assessed with clinical judgement   ADL Overall ADL's : Needs assistance/impaired     Grooming: Sitting;Minimal assistance Grooming Details (indicate cue type and reason): brushing, applying dentures in sitting; MIN A for bimanual tasks                 Toilet Transfer: Minimal assistance;Rolling walker (2 wheels);Moderate assistance Toilet Transfer Details (indicate cue type and reason): short amb transfer with RW Toileting- Clothing Manipulation and Hygiene: Moderate assistance;Sit to/from stand Toileting - Clothing Manipulation Details (indicate cue type and reason): underwear     Functional mobility during ADLs: Moderate assistance;Rolling walker (2 wheels)       Vision Patient Visual Report: Blurring of vision Tracking/Visual Pursuits: Impaired - to be further tested in functional context          Cognition Arousal: Alert Behavior During Therapy: [facility] for tasks assessed/performed Overall Cognitive Status: Within Functional Limits for tasks assessed  Exercises Other Exercises Other Exercises: edu re: role of OT, role of rehab, compensatory vision techniques       General Comments Vss throughout     Pertinent Vitals/ Pain       Pain Assessment Pain Assessment: No/denies pain   Frequency  Min 1X/week        Progress Toward Goals  OT Goals(current goals can now be found in the care plan section)  Progress towards OT goals:  Progressing toward goals      AM-PAC OT "6 Clicks" Daily Activity     Outcome Measure   Help from another person eating meals?: A Little Help from another person taking care of personal grooming?: A Little Help from another person toileting, which includes using toliet, bedpan, or urinal?: A Lot Help from another person bathing (including washing, rinsing, drying)?: A Lot Help from another person to put on and taking off regular upper body clothing?: A Little Help from another person to put on and taking off regular lower body clothing?: A Lot 6 Click Score: 15    End of Session Equipment Utilized During Treatment: Rolling walker (2 wheels);Gait belt  OT Visit Diagnosis: Unsteadiness on feet (R26.81);Dizziness and giddiness (R42);Other (comment)   Activity Tolerance Patient tolerated treatment well;No increased pain   Patient Left in chair;with call bell/phone within reach;with chair alarm set   Nurse Communication Mobility status        Time: [id number] OT Time Calculation (min): 25 min  Charges: OT General Charges $OT Visit: 1 Visit OT Treatments $Self Care/Home Management : 8-22 mins $Therapeutic Activity: 8-22 mins  Oleta Mouse, OTD OTR/L  09/11/22, 12:33 PM

## 2022-09-12 ENCOUNTER — Inpatient Hospital Stay (HOSPITAL_COMMUNITY)
Admission: RE | Admit: 2022-09-12 | Discharge: 2022-09-25 | DRG: 057 | Disposition: A | Discharge status: Home / Self Care | Payer: Medicare HMO | Source: Other Acute Inpatient Hospital | Attending: Physical Medicine & Rehabilitation | Admitting: Physical Medicine & Rehabilitation

## 2022-09-12 ENCOUNTER — Encounter (HOSPITAL_COMMUNITY): Payer: Self-pay | Admitting: Physical Medicine & Rehabilitation

## 2022-09-12 ENCOUNTER — Other Ambulatory Visit: Payer: Self-pay

## 2022-09-12 DIAGNOSIS — L930 Discoid lupus erythematosus: Secondary | ICD-10-CM | POA: Diagnosis not present

## 2022-09-12 DIAGNOSIS — R2689 Other abnormalities of gait and mobility: Secondary | ICD-10-CM | POA: Diagnosis present

## 2022-09-12 DIAGNOSIS — R636 Underweight: Secondary | ICD-10-CM | POA: Diagnosis present

## 2022-09-12 DIAGNOSIS — E785 Hyperlipidemia, unspecified: Secondary | ICD-10-CM | POA: Diagnosis present

## 2022-09-12 DIAGNOSIS — Z7982 Long term (current) use of aspirin: Secondary | ICD-10-CM | POA: Diagnosis not present

## 2022-09-12 DIAGNOSIS — Z79899 Other long term (current) drug therapy: Secondary | ICD-10-CM | POA: Diagnosis not present

## 2022-09-12 DIAGNOSIS — Z79890 Hormone replacement therapy (postmenopausal): Secondary | ICD-10-CM | POA: Diagnosis not present

## 2022-09-12 DIAGNOSIS — I69344 Monoplegia of lower limb following cerebral infarction affecting left non-dominant side: Principal | ICD-10-CM

## 2022-09-12 DIAGNOSIS — E039 Hypothyroidism, unspecified: Secondary | ICD-10-CM | POA: Diagnosis not present

## 2022-09-12 DIAGNOSIS — K5900 Constipation, unspecified: Secondary | ICD-10-CM | POA: Diagnosis not present

## 2022-09-12 DIAGNOSIS — Z681 Body mass index (BMI) 19 or less, adult: Secondary | ICD-10-CM

## 2022-09-12 DIAGNOSIS — I10 Essential (primary) hypertension: Secondary | ICD-10-CM | POA: Diagnosis present

## 2022-09-12 DIAGNOSIS — E559 Vitamin D deficiency, unspecified: Secondary | ICD-10-CM | POA: Diagnosis not present

## 2022-09-12 DIAGNOSIS — D509 Iron deficiency anemia, unspecified: Secondary | ICD-10-CM | POA: Diagnosis not present

## 2022-09-12 DIAGNOSIS — F17210 Nicotine dependence, cigarettes, uncomplicated: Secondary | ICD-10-CM | POA: Diagnosis not present

## 2022-09-12 DIAGNOSIS — I69393 Ataxia following cerebral infarction: Secondary | ICD-10-CM

## 2022-09-12 DIAGNOSIS — I639 Cerebral infarction, unspecified: Secondary | ICD-10-CM

## 2022-09-12 DIAGNOSIS — I63549 Cerebral infarction due to unspecified occlusion or stenosis of unspecified cerebellar artery: Secondary | ICD-10-CM

## 2022-09-12 MED ORDER — DOCUSATE SODIUM 100 MG PO CAPS
100.0000 mg | ORAL_CAPSULE | Freq: Every day | ORAL | Status: DC
  Administered 2022-09-13 – 2022-09-18 (×6): 100 mg via ORAL
  Filled 2022-09-12 (×6): qty 1

## 2022-09-12 MED ORDER — ADULT MULTIVITAMIN W/MINERALS CH: 1.0000 | ORAL_TABLET | Freq: Every day | ORAL | Status: DC

## 2022-09-12 MED ORDER — POLYSACCHARIDE IRON COMPLEX 150 MG PO CAPS
150.0000 mg | ORAL_CAPSULE | Freq: Every day | ORAL | Status: DC
  Administered 2022-09-13 – 2022-09-25 (×13): 150 mg via ORAL
  Filled 2022-09-12 (×13): qty 1

## 2022-09-12 MED ORDER — VARENICLINE TARTRATE 1 MG PO TABS
1.0000 mg | ORAL_TABLET | Freq: Two times a day (BID) | ORAL | Status: DC
  Administered 2022-09-13 – 2022-09-20 (×13): 1 mg via ORAL
  Filled 2022-09-12 (×17): qty 1

## 2022-09-12 MED ORDER — LEVOTHYROXINE SODIUM 100 MCG PO TABS
100.0000 ug | ORAL_TABLET | Freq: Every day | ORAL | Status: DC
  Administered 2022-09-13 – 2022-09-25 (×13): 100 ug via ORAL
  Filled 2022-09-12 (×13): qty 1

## 2022-09-12 MED ORDER — METOPROLOL SUCCINATE ER 50 MG PO TB24: 50.0000 mg | ORAL_TABLET | Freq: Every day | ORAL | Status: DC

## 2022-09-12 MED ORDER — OYSTER SHELL CALCIUM/D3 500-5 MG-MCG PO TABS
1.0000 | ORAL_TABLET | Freq: Every day | ORAL | Status: DC
  Filled 2022-09-12: qty 1

## 2022-09-12 MED ORDER — POLYSACCHARIDE IRON COMPLEX 150 MG PO CAPS: 150.0000 mg | ORAL_CAPSULE | Freq: Every day | ORAL | Status: DC

## 2022-09-12 MED ORDER — ROSUVASTATIN CALCIUM 20 MG PO TABS
40.0000 mg | ORAL_TABLET | Freq: Every day | ORAL | Status: DC
  Administered 2022-09-13 – 2022-09-25 (×13): 40 mg via ORAL
  Filled 2022-09-12 (×13): qty 2

## 2022-09-12 MED ORDER — OYSTER SHELL CALCIUM/D3 500-5 MG-MCG PO TABS
1.0000 | ORAL_TABLET | Freq: Every day | ORAL | Status: DC
  Administered 2022-09-13 – 2022-09-25 (×13): 1 via ORAL
  Filled 2022-09-12 (×13): qty 1

## 2022-09-12 MED ORDER — ENOXAPARIN SODIUM 40 MG/0.4ML IJ SOSY
40.0000 mg | PREFILLED_SYRINGE | INTRAMUSCULAR | Status: DC
  Administered 2022-09-13 – 2022-09-25 (×13): 40 mg via SUBCUTANEOUS
  Filled 2022-09-12 (×14): qty 0.4

## 2022-09-12 MED ORDER — CLOPIDOGREL BISULFATE 75 MG PO TABS
75.0000 mg | ORAL_TABLET | Freq: Every day | ORAL | Status: DC
  Administered 2022-09-13 – 2022-09-25 (×13): 75 mg via ORAL
  Filled 2022-09-12 (×13): qty 1

## 2022-09-12 MED ORDER — METOPROLOL SUCCINATE ER 50 MG PO TB24
50.0000 mg | ORAL_TABLET | Freq: Every day | ORAL | Status: DC
  Administered 2022-09-13 – 2022-09-25 (×13): 50 mg via ORAL
  Filled 2022-09-12 (×13): qty 1

## 2022-09-12 MED ORDER — ACETAMINOPHEN 160 MG/5ML PO SOLN: 650.0000 mg | ORAL | Status: DC | PRN

## 2022-09-12 MED ORDER — AMLODIPINE BESYLATE 2.5 MG PO TABS
2.5000 mg | ORAL_TABLET | Freq: Every day | ORAL | Status: DC
  Administered 2022-09-13 – 2022-09-25 (×13): 2.5 mg via ORAL
  Filled 2022-09-12 (×13): qty 1

## 2022-09-12 MED ORDER — ASPIRIN 81 MG PO TBEC: 81.0000 mg | DELAYED_RELEASE_TABLET | Freq: Every day | ORAL | Status: DC

## 2022-09-12 MED ORDER — DOCUSATE SODIUM 100 MG PO CAPS: 100.0000 mg | ORAL_CAPSULE | Freq: Every day | ORAL | Status: DC

## 2022-09-12 MED ORDER — ACETAMINOPHEN 325 MG PO TABS: 650.0000 mg | ORAL_TABLET | ORAL | Status: DC | PRN

## 2022-09-12 MED ORDER — ROSUVASTATIN CALCIUM 20 MG PO TABS: 40.0000 mg | ORAL_TABLET | Freq: Every day | ORAL | Status: DC

## 2022-09-12 MED ORDER — CLOPIDOGREL BISULFATE 75 MG PO TABS: 75.0000 mg | ORAL_TABLET | Freq: Every day | ORAL | 0 refills | Status: DC

## 2022-09-12 MED ORDER — ADULT MULTIVITAMIN W/MINERALS CH
1.0000 | ORAL_TABLET | Freq: Every day | ORAL | Status: DC
  Administered 2022-09-13 – 2022-09-25 (×13): 1 via ORAL
  Filled 2022-09-12 (×13): qty 1

## 2022-09-12 MED ORDER — ENOXAPARIN SODIUM 40 MG/0.4ML IJ SOSY: 40.0000 mg | PREFILLED_SYRINGE | INTRAMUSCULAR | Status: DC

## 2022-09-12 MED ORDER — ACETAMINOPHEN 650 MG RE SUPP: 650.0000 mg | RECTAL | Status: DC | PRN

## 2022-09-12 MED ORDER — ASPIRIN 81 MG PO TBEC
81.0000 mg | DELAYED_RELEASE_TABLET | Freq: Every day | ORAL | Status: DC
  Administered 2022-09-13 – 2022-09-25 (×13): 81 mg via ORAL
  Filled 2022-09-12 (×13): qty 1

## 2022-09-12 NOTE — H&P (Signed)
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  Per chart review patient lives with spouse.  1 level home 2-3 steps to entry.  Independent driving prior to admission.  Presented to [facility] 09/08/2022 with acute onset of dizziness, nausea and vomiting while eating dinner.  MRI showed acute infarct in the right superior cerebellum and vermis.  No evidence of hemorrhagic transformation.  CTA head and neck no intracranial large vessel occlusion.  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  No hemodynamically significant stenosis in the neck.  Admission chemistries unremarkable except glucose 138, hemoglobin 10.1, troponin negative, TSH 0.448, free T4 1.24, hemoglobin A1c 6.3.  Echocardiogram with ejection fraction of 60 to 65% no wall motion abnormalities grade 1 diastolic dysfunction.  Neurology follow-up placed on aspirin 81 mg daily as well as Plavix 75 mg daily x 3 months then aspirin alone.  Lovenox added for DVT prophylaxis.  Tolerating a regular consistency diet.  Therapy evaluations completed due to patient decreased functional mobility/dizziness was admitted for a comprehensive rehab program.   Review of Systems  Constitutional:  Negative for chills and fever.  HENT:  Negative for hearing loss.   Eyes:  Positive for blurred vision. Negative for double vision.  Respiratory:  Negative for cough, shortness of breath and wheezing.   Cardiovascular:  Negative for chest pain, palpitations and leg swelling.  Gastrointestinal:  Positive for nausea and vomiting.  Genitourinary:  Negative for dysuria,  flank pain and hematuria.  Musculoskeletal:  Positive for myalgias.  Skin:  Negative for rash.  Neurological:  Positive for dizziness.  All other systems reviewed and are negative.       Past Medical History:  Diagnosis Date   Hypertension     Stroke [facility])               Past Surgical History:  Procedure Laterality Date   ABDOMINAL HYSTERECTOMY       CHOLECYSTECTOMY       ENDOVASCULAR REPAIR/STENT GRAFT N/A 06/12/2020    Procedure: ENDOVASCULAR REPAIR/STENT GRAFT;  Surgeon: Renford Dills, MD;  Location: [facility] INVASIVE CV LAB;  Service: Cardiovascular;  Laterality: N/A;             Family History  Problem Relation Age of Onset   Hypertension Mother     Hypertension Father     Diabetes Sister     Breast cancer Neg Hx          Social History:  reports that she has been smoking cigarettes. She uses smokeless tobacco. She reports that she does not currently use alcohol. She reports that she does not use drugs. Allergies:  Allergies  No Known Allergies         Medications Prior to Admission  Medication Sig Dispense Refill   amLODipine (NORVASC) 5 MG tablet Take 5 mg by mouth daily.       aspirin EC 81 MG tablet Take 1 tablet (81 mg total) by mouth daily. Swallow whole. 30 tablet     Calcium Carbonate-Vitamin D 600-200 MG-UNIT TABS Take 1 tablet by mouth daily. 30 tablet 0   clobetasol  cream (TEMOVATE) 0.05 % Apply topically 2 (two) times daily as needed.       docusate sodium (COLACE) 100 MG capsule Take 100 mg by mouth daily.       iron polysaccharides (FERREX 150) 150 MG capsule Take 1 capsule by mouth daily.       metoprolol succinate (TOPROL-XL) 50 MG 24 hr tablet Take 1 tablet (50 mg total) by mouth daily. 30 tablet 0   Multiple Vitamins-Minerals (WOMENS MULTIVITAMIN + COLLAGEN PO) Take 1 tablet by mouth daily.       rosuvastatin (CRESTOR) 40 MG tablet Take 1 tablet (40 mg total) by mouth daily. 30 tablet 0   SYNTHROID 100 MCG tablet Take 1 tablet (100 mcg total)  by mouth daily. 30 tablet 0   varenicline (CHANTIX) 1 MG tablet Take 1 mg by mouth 2 (two) times daily.                  Home: Home Living Family/patient expects to be discharged to:: Inpatient rehab Living Arrangements: Spouse/significant other Available Help at Discharge: Family Type of Home: House Home Access: Stairs to enter [employer] of Steps: 2-3 Entrance Stairs-Rails: None Home Layout: One level Bathroom Shower/Tub: Health visitor: Handicapped height Home Equipment: Information systems manager - built in, Agricultural consultant (2 wheels), [employer] - quad   Functional History: Prior Function Prior Level of Function : Independent/Modified Independent, Driving Mobility Comments: Pt using cane for mobility at baseline. ADLs Comments: Pt reports (I) ADLs and most IADLs. Husband does laundry. Pt drives (husband also drives if needed). (I) medication management. Enjoys church activities. Retired from [employer] lab.   Functional Status:  Mobility: Bed Mobility Overal bed mobility: Modified Independent Bed Mobility: Supine to Sit, Sit to Supine Supine to sit: Modified independent (Device/Increase time) Sit to supine: Modified independent (Device/Increase time) General bed mobility comments: moving better than AM session Transfers Overall transfer level: Needs assistance Equipment used: Rolling walker (2 wheels) Transfers: Sit to/from Stand Sit to Stand: Supervision, Contact guard assist General transfer comment: 4x from recliner in session, 1x from EOB Ambulation/Gait Ambulation/Gait assistance: Contact guard assist, Max assist, +2 safety/equipment (intermittent maxA for LOB recovery 5-6x over 2 walks, otherwise steady with RW and concerted effort) Gait Distance (Feet): 15 Feet (12', 15', 12') Assistive device: Rolling walker (2 wheels) Gait Pattern/deviations: Step-to pattern General Gait Details: AMB in hallway with chair follow; more limited by persistent sleepy state  today, but no LOB that warranted author to intervene for recovery or stabilization.   ADL: ADL Overall ADL's : Needs assistance/impaired Eating/Feeding: Sitting, Set up, Supervision/ safety Eating/Feeding Details (indicate cue type and reason): anticipate supervision, bed level only Grooming: Sitting, Minimal assistance Grooming Details (indicate cue type and reason): brushing, applying dentures in sitting; MIN A for bimanual tasks Upper Body Bathing Details (indicate cue type and reason): anticipate MIN A bed level Lower Body Bathing Details (indicate cue type and reason): anticipate MAX A bed level with back support Upper Body Dressing Details (indicate cue type and reason): anticipate MIN A seated (balance deficit) Lower Body Dressing Details (indicate cue type and reason): anticipate MAX A due to standing balance deficit (pt able to make nearly figure four, but not quite able to get BIL LE over opposite knee; particular difficulty with LLE given baseline weakenss and need for BIL UE to assist with moving LE, meaning UE not available to assist with sitting balance). Toilet Transfer: Minimal assistance, Rolling walker (2 wheels), Moderate assistance Toilet Transfer Details (indicate  cue type and reason): short amb transfer with RW Toileting- Clothing Manipulation and Hygiene: Moderate assistance, Sit to/from stand Toileting - Clothing Manipulation Details (indicate cue type and reason): underwear Tub/Shower Transfer Details (indicate cue type and reason): not yet safe for tub transfer given dizziness/decreased balance Functional mobility during ADLs: Moderate assistance, Rolling walker (2 wheels) General ADL Comments: Able to maintain sitting balance at EOB without physical assist. Has difficulty when reaching for items with RUE, MOD A for STS from EOB at lowest position. Initial instability in standing and with forward weight shift, but pt is able to take several steps toward [facility] with MOD A  before safely lowering to sitting position. Maintains static standing balance at EOB during linen change. Pt noted to have wet bed linens from purewick malfunction. Suspect pt with decreased sensation/awareness of bowel/bladder management.   Cognition: Cognition Overall Cognitive Status: Within Functional Limits for tasks assessed Orientation Level: Oriented X4 Cognition Arousal: Alert Behavior During Therapy: WFL for tasks assessed/performed Overall Cognitive Status: Within Functional Limits for tasks assessed General Comments: A+Ox4. Agreeable, motivated to "do as much as I can".   Physical Exam: Blood pressure 125/71, pulse 84, temperature 99.6 F (37.6 C), temperature source Oral, resp. rate 18, height 5\' 6"  (1.676 m), weight 77.1 kg, SpO2 98%. Physical Exam Constitutional: No apparent distress. Appropriate appearance for age.  HENT: No JVD. Neck Supple. Trachea midline. Atraumatic, normocephalic. Eyes: PERRLA. EOMI. Visual fields grossly intact.  Cardiovascular: RRR, no murmurs/rub/gallops. No Edema. Peripheral pulses 2+  Respiratory: CTAB. No rales, rhonchi, or wheezing. On RA.  Abdomen: + bowel sounds, normoactive. No distention or tenderness.  Skin: + Multiple scaling patches from discoid lupus on chest, arms, neck, and legs MSK:      No apparent deformity.       Neurologic exam:  Cognition: AAO to person, place, time and event.  Language: Fluent, mild difficulty word finding. No dysarthria. Names 3/3 objects correctly.  Memory: Recalls 3/3 objects at 5 minutes. No apparent deficits  Insight: Good  insight into current condition.  Mood: Pleasant affect, appropriate mood.  Sensation: To light touch intact in BL UEs and LEs  Reflexes: 2+ in BL UE and LEs. Negative Hoffman's and babinski signs bilaterally.  CN: 2-12 grossly intact.  Coordination: Severe right upper extremity ataxia, mild left upper extremity ataxia Spasticity: MAS 0 in all extremities.  Strength: Right  upper extremity and right lower extremity 5 out of 5 throughout Left upper extremity 5- out of 5 throughout Left lower extremity 2- out of 5 hip flexion, 3+ out of 5 knee extension, dorsiflexion, plantarflexion Fine motor intact     Lab Results Last 48 Hours  No results found for this or any previous visit (from the past 48 hour(s)).    Imaging Results (Last 48 hours)  ECHOCARDIOGRAM COMPLETE   Result Date: 09/10/2022    ECHOCARDIOGRAM REPORT   Patient Name:   ADELISA BREACH Date of Exam: 09/10/2022 Medical Rec #:  [id number]        Height:       66.0 in Accession #:    [id number]       Weight:       170.0 lb Date of Birth:  1945-04-10        BSA:          1.866 m Patient Age:    76 years         BP:           146/63 mmHg  Patient Gender: F                HR:           67 bpm. Exam Location:  [facility] Procedure: 2D Echo, Cardiac Doppler, Color Doppler and Saline Contrast Bubble            Study Indications:     Stroke I63.9  History:         Patient has prior history of Echocardiogram examinations, most                  recent 08/29/2021. Stroke; Risk Factors:Hypertension.  Sonographer:     Cristela Blue Referring Phys:  [id number] Gordy Councilman Diagnosing Phys: Julien Nordmann MD IMPRESSIONS  1. Left ventricular ejection fraction, by estimation, is 60 to 65%. The left ventricle has normal function. The left ventricle has no regional wall motion abnormalities. There is moderate left ventricular hypertrophy. Left ventricular diastolic parameters are consistent with Grade I diastolic dysfunction (impaired relaxation).  2. Right ventricular systolic function is normal. The right ventricular size is normal. There is normal pulmonary artery systolic pressure. The estimated right ventricular systolic pressure is 20.4 mmHg.  3. The mitral valve is normal in structure. Mild mitral valve regurgitation. No evidence of mitral stenosis.  4. The aortic valve is normal in structure. There is mild calcification of the aortic  valve. Aortic valve regurgitation is not visualized. Aortic valve sclerosis is present, with no evidence of aortic valve stenosis.  5. The inferior vena cava is normal in size with greater than 50% respiratory variability, suggesting right atrial pressure of 3 mmHg.  6. Agitated saline contrast bubble study was negative, with no evidence of any interatrial shunt. FINDINGS  Left Ventricle: Left ventricular ejection fraction, by estimation, is 60 to 65%. The left ventricle has normal function. The left ventricle has no regional wall motion abnormalities. The left ventricular internal cavity size was normal in size. There is  moderate left ventricular hypertrophy. Left ventricular diastolic parameters are consistent with Grade I diastolic dysfunction (impaired relaxation). Right Ventricle: The right ventricular size is normal. No increase in right ventricular wall thickness. Right ventricular systolic function is normal. There is normal pulmonary artery systolic pressure. The tricuspid regurgitant velocity is 1.96 m/s, and  with an assumed right atrial pressure of 5 mmHg, the estimated right ventricular systolic pressure is 20.4 mmHg. Left Atrium: Left atrial size was normal in size. Right Atrium: Right atrial size was normal in size. Pericardium: There is no evidence of pericardial effusion. Mitral Valve: The mitral valve is normal in structure. There is mild calcification of the mitral valve leaflet(s). Mild mitral valve regurgitation. No evidence of mitral valve stenosis. MV peak gradient, 6.2 mmHg. The mean mitral valve gradient is 2.0 mmHg. Tricuspid Valve: The tricuspid valve is normal in structure. Tricuspid valve regurgitation is mild . No evidence of tricuspid stenosis. Aortic Valve: The aortic valve is normal in structure. There is mild calcification of the aortic valve. Aortic valve regurgitation is not visualized. Aortic valve sclerosis is present, with no evidence of aortic valve stenosis. Aortic valve  mean gradient  measures 5.5 mmHg. Aortic valve peak gradient measures 9.9 mmHg. Aortic valve area, by VTI measures 2.60 cm. Pulmonic Valve: The pulmonic valve was normal in structure. Pulmonic valve regurgitation is not visualized. No evidence of pulmonic stenosis. Aorta: The aortic root is normal in size and structure. Venous: The inferior vena cava is normal in size with greater than 50% respiratory variability,  suggesting right atrial pressure of 3 mmHg. IAS/Shunts: No atrial level shunt detected by color flow Doppler. Agitated saline contrast was given intravenously to evaluate for intracardiac shunting. Agitated saline contrast bubble study was negative, with no evidence of any interatrial shunt. There  is no evidence of a patent foramen ovale. There is no evidence of an atrial septal defect.  LEFT VENTRICLE PLAX 2D LVIDd:         3.90 cm   Diastology LVIDs:         2.50 cm   LV e' medial:    5.98 cm/s LV PW:         1.30 cm   LV E/e' medial:  14.4 LV IVS:        1.70 cm   LV e' lateral:   8.92 cm/s LVOT diam:     2.00 cm   LV E/e' lateral: 9.6 LV SV:         82 LV SV Index:   44 LVOT Area:     3.14 cm  RIGHT VENTRICLE RV Basal diam:  3.70 cm RV Mid diam:    2.40 cm RV S prime:     15.80 cm/s TAPSE (M-mode): 1.8 cm LEFT ATRIUM             Index        RIGHT ATRIUM           Index LA diam:        3.00 cm 1.61 cm/m   RA Area:     19.30 cm LA Vol (A2C):   33.9 ml 18.16 ml/m  RA Volume:   55.20 ml  29.58 ml/m LA Vol (A4C):   53.8 ml 28.83 ml/m LA Biplane Vol: 44.2 ml 23.68 ml/m  AORTIC VALVE AV Area (Vmax):    2.21 cm AV Area (Vmean):   2.19 cm AV Area (VTI):     2.60 cm AV Vmax:           157.50 cm/s AV Vmean:          108.650 cm/s AV VTI:            0.317 m AV Peak Grad:      9.9 mmHg AV Mean Grad:      5.5 mmHg LVOT Vmax:         111.00 cm/s LVOT Vmean:        75.600 cm/s LVOT VTI:          0.262 m LVOT/AV VTI ratio: 0.83  AORTA Ao Root diam: 3.10 cm MITRAL VALVE                TRICUSPID VALVE MV Area  (PHT): 2.59 cm     TR Peak grad:   15.4 mmHg MV Area VTI:   2.79 cm     TR Vmax:        196.00 cm/s MV Peak grad:  6.2 mmHg MV Mean grad:  2.0 mmHg     SHUNTS MV Vmax:       1.25 m/s     Systemic VTI:  0.26 m MV Vmean:      66.5 cm/s    Systemic Diam: 2.00 cm MV Decel Time: 293 msec MV E velocity: 85.90 cm/s MV A velocity: 116.00 cm/s MV E/A ratio:  0.74 Julien Nordmann MD Electronically signed by Julien Nordmann MD Signature Date/Time: 09/10/2022/12:01:02 PM    Final            Blood pressure 125/71, pulse  84, temperature 99.6 F (37.6 C), temperature source Oral, resp. rate 18, height 5\' 6"  (1.676 m), weight 77.1 kg, SpO2 98%.   Medical Problem List and Plan: 1. Functional deficits secondary to right SCA territory infarction with persistent dizziness as well as history of right basal ganglia infarction 2023 with residual left leg weakness             -patient may shower             -ELOS/Goals: 10 to 14 days, goals modified independent PT/OT/SLP 2.  Antithrombotics: -DVT/anticoagulation:  Pharmaceutical: Lovenox             -antiplatelet therapy: Aspirin 81 mg daily and Plavix 75 mg day x 90 days then aspirin alone 3. Pain Management: Tylenol as needed 4. Mood/Behavior/Sleep: Provide emotional support             -antipsychotic agents: N/A 5. Neuropsych/cognition: This patient is capable of making decisions on her own behalf. 6. Skin/Wound Care: Routine skin checks 7. Fluids/Electrolytes/Nutrition: Routine in and outs with follow-up chemistries 8.  Permissive hypertension.    Patient on Norvasc 2.5 mg daily, Toprol-XL 50 mg daily prior to admission and has been resumed today 9.  Hypothyroidism.  Synthroid 10.  Hyperlipidemia.  Crestor 11.  Tobacco use.  Chantix.  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  Patient use topical medications only if needed prior to admission.  Husband to bring in from home.     Mcarthur Rossetti Angiulli, PA-C 09/12/2022

## 2022-09-12 NOTE — TOC Transition Note (Signed)
Transition of Care [facility]) - CM/SW Discharge Note   Patient Details  Name: Susan Farrell MRN: [id number] Date of Birth: 05-Mar-1945  [facility]) CM/SW Contact:  Truddie Hidden, RN Phone Number: 09/12/2022, 10:14 AM   Clinical Narrative:    Patient discharging to CIR.  TOC signing off.          Patient Goals and CMS Choice      Discharge Placement                         Discharge Plan and Services Additional resources added to the After Visit Summary for                                       Social Determinants of Health (SDOH) Interventions SDOH Screenings   Food Insecurity: No Food Insecurity (09/09/2022)  Housing: Low Risk  (09/09/2022)  Transportation Needs: No Transportation Needs (09/09/2022)  Utilities: Not At Risk (09/09/2022)  Depression (PHQ2-9): Low Risk  (11/14/2021)  Financial Resource Strain: Low Risk  (08/15/2022)   [facility] from [facility]  Physical Activity: Insufficiently Active (05/12/2019)   Received [facility], [facility]  Social Connections: Socially Integrated (05/12/2019)   [facility] from [facility], [facility]  Stress: No Stress Concern Present (04/21/2022)  Tobacco Use: High Risk (09/09/2022)     Readmission Risk Interventions     No data to display

## 2022-09-12 NOTE — Discharge Instructions (Addendum)
Inpatient Rehab Discharge Instructions  Susan Farrell Discharge date and time: No discharge date for patient encounter.   Activities/Precautions/ Functional Status: Activity: activity as tolerated Diet: regular diet Wound Care: Routine skin checks Functional status:  ___ No restrictions     ___ Walk up steps independently ___ 24/7 supervision/assistance   ___ Walk up steps with assistance ___ Intermittent supervision/assistance  ___ Bathe/dress independently ___ Walk with walker     _x__ Bathe/dress with assistance ___ Walk Independently    ___ Shower independently ___ Walk with assistance    ___ Shower with assistance ___ No alcohol     ___ Return to work/school ________  Special Instructions: No driving smoking or alcohol  Plan aspirin 81 mg daily and Plavix 75 mg daily x 3 months total then aspirin alone at the discretion of neurology services.    STROKE/TIA DISCHARGE INSTRUCTIONS SMOKING Cigarette smoking nearly doubles your risk of having a stroke & is the single most alterable risk factor  If you smoke or have smoked in the last 12 months, you are advised to quit smoking for your health. Most of the excess cardiovascular risk related to smoking disappears within a year of stopping. Ask you doctor about anti-smoking medications [place] Quit Line: 1-800-QUIT NOW Free Smoking Cessation Classes [phone]  CHOLESTEROL Know your levels; limit fat & cholesterol in your diet  Lipid Panel     Component Value Date/Time   CHOL 97 09/09/2022 0202   TRIG 44 09/09/2022 0202   HDL 36 (L) 09/09/2022 0202   CHOLHDL 2.7 09/09/2022 0202   VLDL 9 09/09/2022 0202   LDLCALC 52 09/09/2022 0202     Many patients benefit from treatment even if their cholesterol is at goal. Goal: Total Cholesterol (CHOL) less than 160 Goal:  Triglycerides (TRIG) less than 150 Goal:  HDL greater than 40 Goal:  LDL (LDLCALC) less than 100   BLOOD PRESSURE American Stroke Association blood pressure target  is less that 120/80 mm/Hg  Your discharge blood pressure is:    Monitor your blood pressure Limit your salt and alcohol intake Many individuals will require more than one medication for high blood pressure  DIABETES (A1c is a blood sugar average for last 3 months) Goal HGBA1c is under 7% (HBGA1c is blood sugar average for last 3 months)  Diabetes: No known diagnosis of diabetes    Lab Results  Component Value Date   HGBA1C 6.3 (H) 09/09/2022    Your HGBA1c can be lowered with medications, healthy diet, and exercise. Check your blood sugar as directed by your physician Call your physician if you experience unexplained or low blood sugars.  PHYSICAL ACTIVITY/REHABILITATION Goal is 30 minutes at least 4 days per week  Activity: Increase activity slowly, Therapies: Physical Therapy: Home Health Return to work:  Activity decreases your risk of heart attack and stroke and makes your heart stronger.  It helps control your weight and blood pressure; helps you relax and can improve your mood. Participate in a regular exercise program. Talk with your doctor about the best form of exercise for you (dancing, walking, swimming, cycling).  DIET/WEIGHT Goal is to maintain a healthy weight  Your discharge diet is:  Diet Order     None       liquids Your height is:    Your current weight is:   Your Body Mass Index (BMI) is:    Following the type of diet specifically designed for you will help prevent another stroke. Your goal weight  range is:   Your goal Body Mass Index (BMI) is 19-24. Healthy food habits can help reduce 3 risk factors for stroke:  High cholesterol, hypertension, and excess weight.  RESOURCES Stroke/Support Group:  Call [phone]   STROKE EDUCATION PROVIDED/REVIEWED AND GIVEN TO PATIENT Stroke warning signs and symptoms How to activate emergency medical system (call 911). Medications prescribed at discharge. Need for follow-up after discharge. Personal risk factors for  stroke. Pneumonia vaccine given: No Flu vaccine given: No My questions have been answered, the writing is legible, and I understand these instructions.  I will adhere to these goals & educational materials that have been provided to me after my discharge from the hospital.      My questions have been answered and I understand these instructions. I will adhere to these goals and the provided educational materials after my discharge from the hospital.  Patient/Caregiver Signature _______________________________ Date __________  Clinician Signature _______________________________________ Date __________  Please bring this form and your medication list with you to all your follow-up doctor's appointments.

## 2022-09-12 NOTE — Discharge Summary (Signed)
Physician Discharge Summary   Patient: Susan Farrell MRN: [id number] DOB: 20-Mar-1945  Admit date:     09/08/2022  Discharge date: 09/12/22  Discharge Physician: Susan Farrell   PCP: Susan Reichmann, MD   Recommendations at discharge:    F/u PCP once you are discharged from Rehab  Discharge Diagnoses: Principal Problem:   Cerebellar stroke, acute [facility]) Active Problems:   Hemiparesis affecting left side as late effect of cerebrovascular accident [facility])   Hypothyroidism   History of abdominal aortic aneurysm (AAA) repair   HTN (hypertension)   Discoid lupus   Anemia of chronic disease   Tobacco abuse   Thyroid nodule  77 y.o. 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  Onset was at 5:30 PM on 09/08/2022.  She had associated nausea and went on to have 3 episodes of nonbloody nonbilious vomiting.  Since the onset she has continued to feel dizzy and whereas previously she could stand with the assistance of her cane she is unable to maintain her balance when standing and has to remain seated    MRI showing right superior cerebellar stroke. CT angio showing possibility of a right ICA aneurysm.    Cerebellar stroke, acute (HCC) History of right basal ganglia stroke with residual left hemiparesis --Patient given 300 mg of Plavix and then will do 75 mg of Plavix for 90 days per Neurology recommendations  -- cont aspirin qd --  Patient on Crestor 40 mg daily.  LDL 52.   --PT recommending rehab (possible candidate for CIR)--evaluated by Dr Ty Hilts --d/c to rehab today. Pt is agreeable    Hypothyroidism --Continue levothyroxine   Thyroid nodule --Will need outpatient thyroid ultrasound--defer to  PCP   Tobacco abuse --On Chantix   Anemia of chronic disease --Hemoglobin 9.7.   Discoid lupus --No acute issues   HTN (hypertension) --resumed low dose amlodipine   History of abdominal aortic aneurysm (AAA) repair --No acute issues suspected     Family communication :none today--pt tells me family is aware Consults : neurology CODE STATUS: full DVT Prophylaxis : enoxaparin       Disposition:  acute inpatient rehab Diet recommendation:  Discharge Diet Orders (From admission, onward)     Start     Ordered   09/12/22 0000  Diet - low sodium heart healthy        09/12/22 0958            DISCHARGE MEDICATION: Allergies as of 09/12/2022   No Known Allergies      Medication List     TAKE these medications    amLODipine 5 MG tablet Commonly known as: NORVASC Take 5 mg by mouth daily.   aspirin EC 81 MG tablet Take 1 tablet (81 mg total) by mouth daily. Swallow whole.   Calcium Carbonate-Vitamin D 600-200 MG-UNIT Tabs Take 1 tablet by mouth daily.   clobetasol cream 0.05 % Commonly known as: TEMOVATE Apply topically 2 (two) times daily as needed.   clopidogrel 75 MG tablet Commonly known as: PLAVIX Take 1 tablet (75 mg total) by mouth daily.   docusate sodium 100 MG capsule Commonly known as: COLACE Take 100 mg by mouth daily.   Ferrex 150 150 MG capsule Generic drug: iron polysaccharides Take  1 capsule by mouth daily.   metoprolol succinate 50 MG 24 hr tablet Commonly known as: TOPROL-XL Take 1 tablet (50 mg total) by mouth daily.   rosuvastatin 40 MG tablet Commonly known as: CRESTOR Take 1 tablet (40 mg total) by mouth daily.   Synthroid 100 MCG tablet Generic drug: levothyroxine Take 1 tablet (100 mcg total) by mouth daily.   varenicline 1 MG tablet Commonly known as: CHANTIX Take 1 mg by mouth 2 (two) times daily.   WOMENS MULTIVITAMIN + COLLAGEN PO Take 1 tablet by mouth daily.        Follow-up Information     Susan Reichmann, MD. Schedule an appointment as soon as possible for a visit in 2 week(s).   Specialty: Internal Medicine Contact information: [address] [place] [address] [phone]                Discharge Exam: Ceasar Mons Weights   09/08/22 2100  Weight: 77.1 kg   GENERAL:  77 y.o.-year-old patient with no acute distress.  LUNGS: Normal breath sounds bilaterally, no wheezing CARDIOVASCULAR: S1, S2 normal. No murmur   ABDOMEN: Soft, nontender, nondistended. EXTREMITIES: No  edema b/l.    NEUROLOGIC: nonfocal  patient is alert and awake  Condition at discharge: fair  The results of significant diagnostics from this hospitalization (including imaging, microbiology, ancillary and laboratory) are listed below for reference.   Imaging Studies: ECHOCARDIOGRAM COMPLETE  Result Date: 09/10/2022    ECHOCARDIOGRAM REPORT   Patient Name:   Susan Farrell Date of Exam: 09/10/2022 Medical Rec #:  [id number]        Height:       66.0 in Accession #:    [id number]       Weight:       170.0 lb Date of Birth:  1945/09/29        BSA:          1.866 m Patient Age:    76 years         BP:           146/63 mmHg Patient Gender: F                HR:           67 bpm. Exam Location:  [facility] Procedure: 2D Echo, Cardiac Doppler, Color Doppler and Saline Contrast Bubble            Study Indications:     Stroke I63.9  History:         Patient has prior history of Echocardiogram examinations, most                  recent 08/29/2021. Stroke; Risk Factors:Hypertension.  Sonographer:     Cristela Blue Referring Phys:  [id number] Gordy Councilman Diagnosing Phys: Julien Nordmann MD IMPRESSIONS  1. Left ventricular ejection fraction, by estimation, is 60 to 65%. The left ventricle has normal function. The left ventricle has no regional wall motion abnormalities. There is moderate left ventricular hypertrophy. Left ventricular diastolic parameters are consistent with Grade I diastolic dysfunction  (impaired relaxation).  2. Right ventricular systolic function is normal. The right ventricular size is normal. There is normal pulmonary artery systolic pressure. The estimated right ventricular systolic pressure is 20.4 mmHg.  3. The mitral valve is normal in structure. Mild mitral valve regurgitation. No evidence of mitral stenosis.  4. The aortic valve is normal in structure. There is mild calcification of  the aortic valve. Aortic valve regurgitation is not visualized. Aortic valve sclerosis is present, with no evidence of aortic valve stenosis.  5. The inferior vena cava is normal in size with greater than 50% respiratory variability, suggesting right atrial pressure of 3 mmHg.  6. Agitated saline contrast bubble study was negative, with no evidence of any interatrial shunt. FINDINGS  Left Ventricle: Left ventricular ejection fraction, by estimation, is 60 to 65%. The left ventricle has normal function. The left ventricle has no regional wall motion abnormalities. The left ventricular internal cavity size was normal in size. There is  moderate left ventricular hypertrophy. Left ventricular diastolic parameters are consistent with Grade I diastolic dysfunction (impaired relaxation). Right Ventricle: The right ventricular size is normal. No increase in right ventricular wall thickness. Right ventricular systolic function is normal. There is normal pulmonary artery systolic pressure. The tricuspid regurgitant velocity is 1.96 m/s, and  with an assumed right atrial pressure of 5 mmHg, the estimated right ventricular systolic pressure is 20.4 mmHg. Left Atrium: Left atrial size was normal in size. Right Atrium: Right atrial size was normal in size. Pericardium: There is no evidence of pericardial effusion. Mitral Valve: The mitral valve is normal in structure. There is mild calcification of the mitral valve leaflet(s). Mild mitral valve regurgitation. No evidence of mitral valve stenosis. MV peak gradient, 6.2 mmHg.  The mean mitral valve gradient is 2.0 mmHg. Tricuspid Valve: The tricuspid valve is normal in structure. Tricuspid valve regurgitation is mild . No evidence of tricuspid stenosis. Aortic Valve: The aortic valve is normal in structure. There is mild calcification of the aortic valve. Aortic valve regurgitation is not visualized. Aortic valve sclerosis is present, with no evidence of aortic valve stenosis. Aortic valve mean gradient  measures 5.5 mmHg. Aortic valve peak gradient measures 9.9 mmHg. Aortic valve area, by VTI measures 2.60 cm. Pulmonic Valve: The pulmonic valve was normal in structure. Pulmonic valve regurgitation is not visualized. No evidence of pulmonic stenosis. Aorta: The aortic root is normal in size and structure. Venous: The inferior vena cava is normal in size with greater than 50% respiratory variability, suggesting right atrial pressure of 3 mmHg. IAS/Shunts: No atrial level shunt detected by color flow Doppler. Agitated saline contrast was given intravenously to evaluate for intracardiac shunting. Agitated saline contrast bubble study was negative, with no evidence of any interatrial shunt. There  is no evidence of a patent foramen ovale. There is no evidence of an atrial septal defect.  LEFT VENTRICLE PLAX 2D LVIDd:         3.90 cm   Diastology LVIDs:         2.50 cm   LV e' medial:    5.98 cm/s LV PW:         1.30 cm   LV E/e' medial:  14.4 LV IVS:        1.70 cm   LV e' lateral:   8.92 cm/s LVOT diam:     2.00 cm   LV E/e' lateral: 9.6 LV SV:         82 LV SV Index:   44 LVOT Area:     3.14 cm  RIGHT VENTRICLE RV Basal diam:  3.70 cm RV Mid diam:    2.40 cm RV S prime:     15.80 cm/s TAPSE (M-mode): 1.8 cm LEFT ATRIUM             Index        RIGHT ATRIUM  Index LA diam:        3.00 cm 1.61 cm/m   RA Area:     19.30 cm LA Vol (A2C):   33.9 ml 18.16 ml/m  RA Volume:   55.20 ml  29.58 ml/m LA Vol (A4C):   53.8 ml 28.83 ml/m LA Biplane Vol: 44.2 ml 23.68 ml/m  AORTIC VALVE AV  Area (Vmax):    2.21 cm AV Area (Vmean):   2.19 cm AV Area (VTI):     2.60 cm AV Vmax:           157.50 cm/s AV Vmean:          108.650 cm/s AV VTI:            0.317 m AV Peak Grad:      9.9 mmHg AV Mean Grad:      5.5 mmHg LVOT Vmax:         111.00 cm/s LVOT Vmean:        75.600 cm/s LVOT VTI:          0.262 m LVOT/AV VTI ratio: 0.83  AORTA Ao Root diam: 3.10 cm MITRAL VALVE                TRICUSPID VALVE MV Area (PHT): 2.59 cm     TR Peak grad:   15.4 mmHg MV Area VTI:   2.79 cm     TR Vmax:        196.00 cm/s MV Peak grad:  6.2 mmHg MV Mean grad:  2.0 mmHg     SHUNTS MV Vmax:       1.25 m/s     Systemic VTI:  0.26 m MV Vmean:      66.5 cm/s    Systemic Diam: 2.00 cm MV Decel Time: 293 msec MV E velocity: 85.90 cm/s MV A velocity: 116.00 cm/s MV E/A ratio:  0.74 Julien Nordmann MD Electronically signed by Julien Nordmann MD Signature Date/Time: 09/10/2022/12:01:02 PM    Final    CT Angio Head Neck W WO CM  Result Date: 09/09/2022 CLINICAL DATA:  Central vertigo, emesis EXAM: CT ANGIOGRAPHY HEAD AND NECK WITH AND WITHOUT CONTRAST TECHNIQUE: Multidetector CT imaging of the head and neck was performed using the standard protocol during bolus administration of intravenous contrast. Multiplanar CT image reconstructions and MIPs were obtained to evaluate the vascular anatomy. Carotid stenosis measurements (when applicable) are obtained utilizing NASCET criteria, using the distal internal carotid diameter as the denominator. RADIATION DOSE REDUCTION: This exam was performed according to the departmental dose-optimization program which includes automated exposure control, adjustment of the mA and/or kV according to patient size and/or use of iterative reconstruction technique. CONTRAST:  75mL OMNIPAQUE IOHEXOL 350 MG/ML SOLN COMPARISON:  11/09/2021 CT head, 08/28/2021 CTA head and neck, 09/09/2022 MRI head FINDINGS: CT HEAD FINDINGS Brain: Hypodensity in the superior right cerebellum and vermis, consistent with the  acute right SCA territory infarct seen on the same-day MRI. No evidence of acute hemorrhage, mass, mass effect, or midline shift. No hydrocephalus or extra-axial fluid collection. Sequela of remote right basal ganglia infarct. Vascular: No hyperdense vessel. Skull: Negative for fracture or focal lesion. Sinuses/Orbits: No acute finding. Other: The mastoid air cells are well aerated. CTA NECK FINDINGS Aortic arch: Standard branching. Imaged portion shows no evidence of aneurysm or dissection. No significant stenosis of the major arch vessel origins. Right carotid system: No evidence of dissection, occlusion, or hemodynamically significant stenosis (greater than 50%). Left carotid system: No evidence of  dissection, occlusion, or hemodynamically significant stenosis (greater than 50%). Vertebral arteries: No evidence of dissection, occlusion, or hemodynamically significant stenosis (greater than 50%). Skeleton: No acute osseous abnormality. Degenerative changes in the cervical spine. Other neck: Negative. Upper chest: No focal pulmonary opacity or pleural effusion. Review of the MIP images confirms the above findings CTA HEAD FINDINGS Evaluation is somewhat limited by bolus timing and motion. This particularly affects the distal anterior circulation. Anterior circulation: Both internal carotid arteries are patent to the termini, without significant stenosis. A1 segments patent. Normal anterior communicating artery. Anterior cerebral arteries are patent through the A2 segments, with more distal segments limited by motion. No M1 stenosis or occlusion. MCA branches perfused through the M2 segments, with more distal segments limited by motion. Posterior circulation: Vertebral arteries patent to the vertebrobasilar junction without significant stenosis. Posterior inferior cerebellar arteries patent proximally. Basilar patent to its distal aspect without significant stenosis. Superior cerebellar arteries patent proximally,  although the right proximal SCA is decreased in caliber compared to the prior exam (series 10, image 126) Patent P1 segments. Moderate to severe stenosis in the proximal right P3 (series 10, image 114 and series 11, image 24). PCAs perfused to their distal aspects without significant stenosis. The bilateral posterior communicating arteries are not visualized. Venous sinuses: As permitted by contrast timing and motion, patent. Anatomic variants: None significant. Review of the MIP images confirms the above findings IMPRESSION: 1. Hypodensity in the superior right cerebellum and vermis, consistent with the acute right SCA territory infarct seen on the same-day MRI. 2. Decreased caliber of proximal right SCA compared to 08/28/2021, although the vessel does appear patent proximally. 3. No intracranial large vessel occlusion. 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 4. No hemodynamically significant stenosis in the neck. These results were called by telephone at the time of interpretation on 09/09/2022 at 1:06 am to provider [facility] , who verbally acknowledged these results. Electronically Signed   By: Wiliam Ke M.D.   On: 09/09/2022 01:17   MR BRAIN WO CONTRAST  Result Date: 09/09/2022 CLINICAL DATA:  Dizziness, stroke suspected, vomiting EXAM: MRI HEAD WITHOUT CONTRAST TECHNIQUE: Multiplanar, multiecho pulse sequences of the brain and surrounding structures were obtained without intravenous contrast. COMPARISON:  08/29/2021 MRI head, correlation is also made with 11/09/2021 CT head FINDINGS: Brain: Restricted diffusion with ADC correlate in the right superior cerebellum and vermis, which correlates the right superior cerebellar artery territory. This area is associated with increased T2 hyperintense signal, likely cytotoxic edema. No evidence of hemorrhagic transformation. No acute hemorrhage, mass, mass effect, or midline shift. No hydrocephalus or extra-axial collection. Partial empty sella.  Normal craniocervical junction. No hemosiderin deposition to suggest remote hemorrhage. Remote infarct in the right basal ganglia. Somewhat disproportionate volume loss in the posterior left frontal lobe (series 15, image 39). Scattered T2 hyperintense signal in the periventricular white matter, likely the sequela of chronic small vessel ischemic disease. Vascular: Normal arterial flow voids. Skull and upper cervical spine: Normal marrow signal. Sinuses/Orbits: Clear paranasal sinuses. No acute finding in the orbits. Other: The mastoid air cells are well aerated. IMPRESSION: Acute infarct in the right superior cerebellum and vermis, which correlates with the right superior cerebellar artery territory. No evidence of hemorrhagic transformation. These results were called by telephone at the time of interpretation on 09/09/2022 at 1:06 am to provider [facility] , who verbally acknowledged these results. Electronically Signed   By: Wiliam Ke M.D.   On: 09/09/2022 01:07   DG  Chest 2 View  Result Date: 09/08/2022 CLINICAL DATA:  Dizziness EXAM: CHEST - 2 VIEW COMPARISON:  08/28/2021 FINDINGS: Cardiomegaly. Mediastinal contours within normal limits. Linear left lower lung opacities, favor atelectasis. Right lung clear. No effusions or edema. No acute bony abnormality. IMPRESSION: Cardiomegaly. Linear left lower lung opacities, favor atelectasis. Electronically Signed   By: Charlett Nose M.D.   On: 09/08/2022 22:22    Microbiology: Results for orders placed or performed during the hospital encounter of 08/28/21  Resp Panel by RT-PCR (Flu A&B, Covid) Anterior Nasal Swab     Status: None   Collection Time: 08/28/21  8:18 PM   Specimen: Anterior Nasal Swab  Result Value Ref Range Status   SARS Coronavirus 2 by RT PCR NEGATIVE NEGATIVE Final    Comment: (NOTE) SARS-CoV-2 target nucleic acids are NOT DETECTED.  The SARS-CoV-2 RNA is generally detectable in upper respiratory specimens during the acute phase of  infection. The lowest concentration of SARS-CoV-2 viral copies this assay can detect is 138 copies/mL. A negative result does not preclude SARS-Cov-2 infection and should not be used as the sole basis for treatment or other patient management decisions. A negative result may occur with  improper specimen collection/handling, submission of specimen other than nasopharyngeal swab, presence of viral mutation(s) within the areas targeted by this assay, and inadequate number of viral copies(<138 copies/mL). A negative result must be combined with clinical observations, patient history, and epidemiological information. The expected result is Negative.  Fact Sheet for Patients:  BloggerCourse.com  Fact Sheet for Healthcare Providers:  SeriousBroker.it  This test is no t yet approved or cleared by the [place] FDA and  has been authorized for detection and/or diagnosis of SARS-CoV-2 by FDA under an Emergency Use Authorization (EUA). This EUA will remain  in effect (meaning this test can be used) for the duration of the COVID-19 declaration under Section 564(b)(1) of the Act, 21 U.S.C.section 360bbb-3(b)(1), unless the authorization is terminated  or revoked sooner.       Influenza A by PCR NEGATIVE NEGATIVE Final   Influenza B by PCR NEGATIVE NEGATIVE Final    Comment: (NOTE) The Xpert Xpress SARS-CoV-2/FLU/RSV plus assay is intended as an aid in the diagnosis of influenza from Nasopharyngeal swab specimens and should not be used as a sole basis for treatment. Nasal washings and aspirates are unacceptable for Xpert Xpress SARS-CoV-2/FLU/RSV testing.  Fact Sheet for Patients: BloggerCourse.com  Fact Sheet for Healthcare Providers: SeriousBroker.it  This test is not yet approved or cleared by the [place] FDA and has been authorized for detection and/or diagnosis of SARS-CoV-2  by FDA under an Emergency Use Authorization (EUA). This EUA will remain in effect (meaning this test can be used) for the duration of the COVID-19 declaration under Section 564(b)(1) of the Act, 21 U.S.C. section 360bbb-3(b)(1), unless the authorization is terminated or revoked.  Performed at [facility], [address] [place], [place] [address]     Labs: CBC: Recent Labs  Lab 09/08/22 2102 09/09/22 0202 09/10/22 0508  WBC 8.1 6.9 4.0  HGB 10.1* 9.7* 9.3*  HCT 32.7* 31.1* 30.3*  MCV 77.9* 77.0* 78.1*  PLT 269 221 199   Basic Metabolic Panel: Recent Labs  Lab 09/08/22 2102 09/09/22 0202 09/10/22 0508  NA 138  --  138  K 4.2  --  3.4*  CL 104  --  106  CO2 25  --  24  GLUCOSE 138*  --  85  BUN 14  --  10  CREATININE 0.85 0.83 0.82  CALCIUM 9.1  --  8.4*    Discharge time spent: greater than 30 minutes.  Signed: Enedina Finner, MD [facility] 09/12/2022

## 2022-09-12 NOTE — H&P (Incomplete)
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  Per chart review patient lives with spouse.  1 level home 2-3 steps to entry.  Independent driving prior to admission.  Presented to [facility] 09/08/2022 with acute onset of dizziness, nausea and vomiting while eating dinner.  MRI showed acute infarct in the right superior cerebellum and vermis.  No evidence of hemorrhagic transformation.  CTA head and neck no intracranial large vessel occlusion.  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  No hemodynamically significant stenosis in the neck.  Admission chemistries unremarkable except glucose 138, hemoglobin 10.1, troponin negative, TSH 0.448, free T4 1.24, hemoglobin A1c 6.3.  Echocardiogram with ejection fraction of 60 to 65% no wall motion abnormalities grade 1 diastolic dysfunction.  Neurology follow-up placed on aspirin 81 mg daily as well as Plavix 75 mg daily x 3 months then aspirin alone.  Lovenox added for DVT prophylaxis.  Tolerating a regular consistency diet.  Therapy evaluations completed due to patient decreased functional mobility/dizziness was admitted for a comprehensive rehab program.  Review of Systems  Constitutional:  Negative for chills and fever.  HENT:  Negative for hearing loss.   Eyes:  Positive for blurred vision. Negative for double vision.  Respiratory:  Negative for cough, shortness of breath and wheezing.   Cardiovascular:  Negative for chest pain, palpitations and leg swelling.  Gastrointestinal:  Positive for nausea and vomiting.  Genitourinary:  Negative for dysuria, flank pain  and hematuria.  Musculoskeletal:  Positive for myalgias.  Skin:  Negative for rash.  Neurological:  Positive for dizziness.  All other systems reviewed and are negative.  Past Medical History:  Diagnosis Date   Hypertension    Stroke [facility])    Past Surgical History:  Procedure Laterality Date   ABDOMINAL HYSTERECTOMY     CHOLECYSTECTOMY     ENDOVASCULAR REPAIR/STENT GRAFT N/A 06/12/2020   Procedure: ENDOVASCULAR REPAIR/STENT GRAFT;  Surgeon: Renford Dills, MD;  Location: [facility] INVASIVE CV LAB;  Service: Cardiovascular;  Laterality: N/A;   Family History  Problem Relation Age of Onset   Hypertension Mother    Hypertension Father    Diabetes Sister    Breast cancer Neg Hx    Social History:  reports that she has been smoking cigarettes. She uses smokeless tobacco. She reports that she does not currently use alcohol. She reports that she does not use drugs. Allergies: No Known Allergies Medications Prior to Admission  Medication Sig Dispense Refill   amLODipine (NORVASC) 5 MG tablet Take 5 mg by mouth daily.     aspirin EC 81 MG tablet Take 1 tablet (81 mg total) by mouth daily. Swallow whole. 30 tablet    Calcium Carbonate-Vitamin D 600-200 MG-UNIT TABS Take 1 tablet by mouth daily. 30 tablet 0   clobetasol cream (TEMOVATE) 0.05 % Apply topically 2 (two) times daily as needed.     docusate sodium (COLACE) 100 MG capsule Take 100 mg by mouth daily.     iron polysaccharides (FERREX 150) 150 MG capsule Take 1 capsule by mouth daily.     metoprolol succinate (TOPROL-XL) 50 MG 24 hr tablet Take 1 tablet (50 mg  total) by mouth daily. 30 tablet 0   Multiple Vitamins-Minerals (WOMENS MULTIVITAMIN + COLLAGEN PO) Take 1 tablet by mouth daily.     rosuvastatin (CRESTOR) 40 MG tablet Take 1 tablet (40 mg total) by mouth daily. 30 tablet 0   SYNTHROID 100 MCG tablet Take 1 tablet (100 mcg total) by mouth daily. 30 tablet 0   varenicline (CHANTIX) 1 MG tablet Take 1 mg by mouth 2 (two) times  daily.        Home: Home Living Family/patient expects to be discharged to:: Inpatient rehab Living Arrangements: Spouse/significant other Available Help at Discharge: Family Type of Home: House Home Access: Stairs to enter [employer] of Steps: 2-3 Entrance Stairs-Rails: None Home Layout: One level Bathroom Shower/Tub: Health visitor: Handicapped height Home Equipment: Information systems manager - built in, Agricultural consultant (2 wheels), [employer] - quad   Functional History: Prior Function Prior Level of Function : Independent/Modified Independent, Driving Mobility Comments: Pt using cane for mobility at baseline. ADLs Comments: Pt reports (I) ADLs and most IADLs. Husband does laundry. Pt drives (husband also drives if needed). (I) medication management. Enjoys church activities. Retired from [employer] lab.  Functional Status:  Mobility: Bed Mobility Overal bed mobility: Modified Independent Bed Mobility: Supine to Sit, Sit to Supine Supine to sit: Modified independent (Device/Increase time) Sit to supine: Modified independent (Device/Increase time) General bed mobility comments: moving better than AM session Transfers Overall transfer level: Needs assistance Equipment used: Rolling walker (2 wheels) Transfers: Sit to/from Stand Sit to Stand: Supervision, Contact guard assist General transfer comment: 4x from recliner in session, 1x from EOB Ambulation/Gait Ambulation/Gait assistance: Contact guard assist, Max assist, +2 safety/equipment (intermittent maxA for LOB recovery 5-6x over 2 walks, otherwise steady with RW and concerted effort) Gait Distance (Feet): 15 Feet (12', 15', 12') Assistive device: Rolling walker (2 wheels) Gait Pattern/deviations: Step-to pattern General Gait Details: AMB in hallway with chair follow; more limited by persistent sleepy state today, but no LOB that warranted author to intervene for recovery or stabilization.    ADL: ADL Overall ADL's  : Needs assistance/impaired Eating/Feeding: Sitting, Set up, Supervision/ safety Eating/Feeding Details (indicate cue type and reason): anticipate supervision, bed level only Grooming: Sitting, Minimal assistance Grooming Details (indicate cue type and reason): brushing, applying dentures in sitting; MIN A for bimanual tasks Upper Body Bathing Details (indicate cue type and reason): anticipate MIN A bed level Lower Body Bathing Details (indicate cue type and reason): anticipate MAX A bed level with back support Upper Body Dressing Details (indicate cue type and reason): anticipate MIN A seated (balance deficit) Lower Body Dressing Details (indicate cue type and reason): anticipate MAX A due to standing balance deficit (pt able to make nearly figure four, but not quite able to get BIL LE over opposite knee; particular difficulty with LLE given baseline weakenss and need for BIL UE to assist with moving LE, meaning UE not available to assist with sitting balance). Toilet Transfer: Minimal assistance, Rolling walker (2 wheels), Moderate assistance Toilet Transfer Details (indicate cue type and reason): short amb transfer with RW Toileting- Clothing Manipulation and Hygiene: Moderate assistance, Sit to/from stand Toileting - Clothing Manipulation Details (indicate cue type and reason): underwear Tub/Shower Transfer Details (indicate cue type and reason): not yet safe for tub transfer given dizziness/decreased balance Functional mobility during ADLs: Moderate assistance, Rolling walker (2 wheels) General ADL Comments: Able to maintain sitting balance at EOB without physical assist. Has difficulty when reaching for items with RUE, MOD A  for STS from EOB at lowest position. Initial instability in standing and with forward weight shift, but pt is able to take several steps toward [facility] with MOD A before safely lowering to sitting position. Maintains static standing balance at EOB during linen change. Pt noted  to have wet bed linens from purewick malfunction. Suspect pt with decreased sensation/awareness of bowel/bladder management.  Cognition: Cognition Overall Cognitive Status: Within Functional Limits for tasks assessed Orientation Level: Oriented X4 Cognition Arousal: Alert Behavior During Therapy: WFL for tasks assessed/performed Overall Cognitive Status: Within Functional Limits for tasks assessed General Comments: A+Ox4. Agreeable, motivated to "do as much as I can".  Physical Exam: Blood pressure 125/71, pulse 84, temperature 99.6 F (37.6 C), temperature source Oral, resp. rate 18, height 5\' 6"  (1.676 m), weight 77.1 kg, SpO2 98%. Physical Exam Neurological:     Comments: Patient is alert.  Makes eye contact with examiner.  Follows simple commands.  Oriented x 3.  Fair insight and awareness.     No results found for this or any previous visit (from the past 48 hour(s)). ECHOCARDIOGRAM COMPLETE  Result Date: 09/10/2022    ECHOCARDIOGRAM REPORT   Patient Name:   Susan Farrell Date of Exam: 09/10/2022 Medical Rec #:  [id number]        Height:       66.0 in Accession #:    [id number]       Weight:       170.0 lb Date of Birth:  [birth year]        BSA:          1.866 m Patient Age:    76 years         BP:           146/63 mmHg Patient Gender: F                HR:           67 bpm. Exam Location:  [facility] Procedure: 2D Echo, Cardiac Doppler, Color Doppler and Saline Contrast Bubble            Study Indications:     Stroke I63.9  History:         Patient has prior history of Echocardiogram examinations, most                  recent 08/29/2021. Stroke; Risk Factors:Hypertension.  Sonographer:     Cristela Blue Referring Phys:  [id number] Gordy Councilman Diagnosing Phys: Julien Nordmann MD IMPRESSIONS  1. Left ventricular ejection fraction, by estimation, is 60 to 65%. The left ventricle has normal function. The left ventricle has no regional wall motion abnormalities. There is moderate left ventricular  hypertrophy. Left ventricular diastolic parameters are consistent with Grade I diastolic dysfunction (impaired relaxation).  2. Right ventricular systolic function is normal. The right ventricular size is normal. There is normal pulmonary artery systolic pressure. The estimated right ventricular systolic pressure is 20.4 mmHg.  3. The mitral valve is normal in structure. Mild mitral valve regurgitation. No evidence of mitral stenosis.  4. The aortic valve is normal in structure. There is mild calcification of the aortic valve. Aortic valve regurgitation is not visualized. Aortic valve sclerosis is present, with no evidence of aortic valve stenosis.  5. The inferior vena cava is normal in size with greater than 50% respiratory variability, suggesting right atrial pressure of 3 mmHg.  6. Agitated saline contrast bubble study was negative, with no evidence of any interatrial shunt.  FINDINGS  Left Ventricle: Left ventricular ejection fraction, by estimation, is 60 to 65%. The left ventricle has normal function. The left ventricle has no regional wall motion abnormalities. The left ventricular internal cavity size was normal in size. There is  moderate left ventricular hypertrophy. Left ventricular diastolic parameters are consistent with Grade I diastolic dysfunction (impaired relaxation). Right Ventricle: The right ventricular size is normal. No increase in right ventricular wall thickness. Right ventricular systolic function is normal. There is normal pulmonary artery systolic pressure. The tricuspid regurgitant velocity is 1.96 m/s, and  with an assumed right atrial pressure of 5 mmHg, the estimated right ventricular systolic pressure is 20.4 mmHg. Left Atrium: Left atrial size was normal in size. Right Atrium: Right atrial size was normal in size. Pericardium: There is no evidence of pericardial effusion. Mitral Valve: The mitral valve is normal in structure. There is mild calcification of the mitral valve  leaflet(s). Mild mitral valve regurgitation. No evidence of mitral valve stenosis. MV peak gradient, 6.2 mmHg. The mean mitral valve gradient is 2.0 mmHg. Tricuspid Valve: The tricuspid valve is normal in structure. Tricuspid valve regurgitation is mild . No evidence of tricuspid stenosis. Aortic Valve: The aortic valve is normal in structure. There is mild calcification of the aortic valve. Aortic valve regurgitation is not visualized. Aortic valve sclerosis is present, with no evidence of aortic valve stenosis. Aortic valve mean gradient  measures 5.5 mmHg. Aortic valve peak gradient measures 9.9 mmHg. Aortic valve area, by VTI measures 2.60 cm. Pulmonic Valve: The pulmonic valve was normal in structure. Pulmonic valve regurgitation is not visualized. No evidence of pulmonic stenosis. Aorta: The aortic root is normal in size and structure. Venous: The inferior vena cava is normal in size with greater than 50% respiratory variability, suggesting right atrial pressure of 3 mmHg. IAS/Shunts: No atrial level shunt detected by color flow Doppler. Agitated saline contrast was given intravenously to evaluate for intracardiac shunting. Agitated saline contrast bubble study was negative, with no evidence of any interatrial shunt. There  is no evidence of a patent foramen ovale. There is no evidence of an atrial septal defect.  LEFT VENTRICLE PLAX 2D LVIDd:         3.90 cm   Diastology LVIDs:         2.50 cm   LV e' medial:    5.98 cm/s LV PW:         1.30 cm   LV E/e' medial:  14.4 LV IVS:        1.70 cm   LV e' lateral:   8.92 cm/s LVOT diam:     2.00 cm   LV E/e' lateral: 9.6 LV SV:         82 LV SV Index:   44 LVOT Area:     3.14 cm  RIGHT VENTRICLE RV Basal diam:  3.70 cm RV Mid diam:    2.40 cm RV S prime:     15.80 cm/s TAPSE (M-mode): 1.8 cm LEFT ATRIUM             Index        RIGHT ATRIUM           Index LA diam:        3.00 cm 1.61 cm/m   RA Area:     19.30 cm LA Vol (A2C):   33.9 ml 18.16 ml/m  RA Volume:    55.20 ml  29.58 ml/m LA Vol (A4C):   53.8 ml 28.83  ml/m LA Biplane Vol: 44.2 ml 23.68 ml/m  AORTIC VALVE AV Area (Vmax):    2.21 cm AV Area (Vmean):   2.19 cm AV Area (VTI):     2.60 cm AV Vmax:           157.50 cm/s AV Vmean:          108.650 cm/s AV VTI:            0.317 m AV Peak Grad:      9.9 mmHg AV Mean Grad:      5.5 mmHg LVOT Vmax:         111.00 cm/s LVOT Vmean:        75.600 cm/s LVOT VTI:          0.262 m LVOT/AV VTI ratio: 0.83  AORTA Ao Root diam: 3.10 cm MITRAL VALVE                TRICUSPID VALVE MV Area (PHT): 2.59 cm     TR Peak grad:   15.4 mmHg MV Area VTI:   2.79 cm     TR Vmax:        196.00 cm/s MV Peak grad:  6.2 mmHg MV Mean grad:  2.0 mmHg     SHUNTS MV Vmax:       1.25 m/s     Systemic VTI:  0.26 m MV Vmean:      66.5 cm/s    Systemic Diam: 2.00 cm MV Decel Time: 293 msec MV E velocity: 85.90 cm/s MV A velocity: 116.00 cm/s MV E/A ratio:  0.74 Julien Nordmann MD Electronically signed by Julien Nordmann MD Signature Date/Time: 09/10/2022/12:01:02 PM    Final       Blood pressure 125/71, pulse 84, temperature 99.6 F (37.6 C), temperature source Oral, resp. rate 18, height 5\' 6"  (1.676 m), weight 77.1 kg, SpO2 98%.  Medical Problem List and Plan: 1. Functional deficits secondary to right SCA territory infarction with persistent dizziness as well as history of right basal ganglia infarction 2023 with residual left leg weakness  -patient may *** shower  -ELOS/Goals: *** 2.  Antithrombotics: -DVT/anticoagulation:  Pharmaceutical: Lovenox  -antiplatelet therapy: Aspirin 81 mg daily and Plavix 75 mg day x 90 days then aspirin alone 3. Pain Management: Tylenol as needed 4. Mood/Behavior/Sleep: Provide emotional support  -antipsychotic agents: N/A 5. Neuropsych/cognition: This patient is capable of making decisions on her own behalf. 6. Skin/Wound Care: Routine skin checks 7. Fluids/Electrolytes/Nutrition: Routine in and outs with follow-up chemistries 8.  Permissive  hypertension.    Patient on Norvasc 2.5 mg daily, Toprol-XL 50 mg daily prior to admission and has been resumed today 9.  Hypothyroidism.  Synthroid 10.  Hyperlipidemia.  Crestor 11.  Tobacco use.  Chantix.  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  Patient use topical medications only if needed prior to admission.   Mcarthur Rossetti Tomasa Dobransky, PA-C 09/12/2022

## 2022-09-12 NOTE — Progress Notes (Signed)
Patient arrived via ems at 1330. Oriented to unit routine. Verbalize will not get out of bed without calling for staff. No s/s of resp distress. No c/o pain or discomfort noted. Incontinent of bowel and bladder. Left sided weakness.Alert and oriented x4.

## 2022-09-12 NOTE — Progress Notes (Signed)
Signed     Expand All Collapse All          Physical Medicine and Rehabilitation Consult Reason for Consult: Acute cerebellar CVA Referring Physician: Enedina Finner, MD     HPI: Susan Farrell is a 77 y.o. female who came to the [facility] ED on 8/19 after acute onset N/V and dizziness. MRI showed acute infarct in the right superior cerebellum and vermis. PMH includes discoid lupus, HTN, hypothyroidism, AAA s/p rupture and repair, nicotine dependence, and basal ganglia CVA in 08/2001. Physical Medicine & Rehabilitation was consulted to assess candidacy for CIR.       ROS +improved vision     Past Medical History:  Diagnosis Date   Hypertension     Stroke [facility])               Past Surgical History:  Procedure Laterality Date   ABDOMINAL HYSTERECTOMY       CHOLECYSTECTOMY       ENDOVASCULAR REPAIR/STENT GRAFT N/A 06/12/2020    Procedure: ENDOVASCULAR REPAIR/STENT GRAFT;  Surgeon: Renford Dills, MD;  Location: [facility] INVASIVE CV LAB;  Service: Cardiovascular;  Laterality: N/A;             Family History  Problem Relation Age of Onset   Hypertension Mother     Hypertension Father     Diabetes Sister     Breast cancer Neg Hx          Social History:  reports that she has been smoking cigarettes. She uses smokeless tobacco. She reports that she does not currently use alcohol. She reports that she does not use drugs. Allergies:  Allergies  No Known Allergies         Medications Prior to Admission  Medication Sig Dispense Refill   amLODipine (NORVASC) 5 MG tablet Take 5 mg by mouth daily.       aspirin EC 81 MG tablet Take 1 tablet (81 mg total) by mouth daily. Swallow whole. 30 tablet     Calcium Carbonate-Vitamin D 600-200 MG-UNIT TABS Take 1 tablet by mouth daily. 30 tablet 0   clobetasol cream (TEMOVATE) 0.05 % Apply topically 2 (two) times daily as needed.       docusate sodium (COLACE) 100 MG capsule Take 100 mg by mouth daily.       iron polysaccharides (FERREX 150)  150 MG capsule Take 1 capsule by mouth daily.       metoprolol succinate (TOPROL-XL) 50 MG 24 hr tablet Take 1 tablet (50 mg total) by mouth daily. 30 tablet 0   Multiple Vitamins-Minerals (WOMENS MULTIVITAMIN + COLLAGEN PO) Take 1 tablet by mouth daily.       rosuvastatin (CRESTOR) 40 MG tablet Take 1 tablet (40 mg total) by mouth daily. 30 tablet 0   SYNTHROID 100 MCG tablet Take 1 tablet (100 mcg total) by mouth daily. 30 tablet 0   varenicline (CHANTIX) 1 MG tablet Take 1 mg by mouth 2 (two) times daily.              Home: Home Living Family/patient expects to be discharged to:: Inpatient rehab Living Arrangements: Spouse/significant other Available Help at Discharge: Family Type of Home: House Home Access: Stairs to enter [employer] of Steps: 2-3 Entrance Stairs-Rails: None Home Layout: One level Bathroom Shower/Tub: Health visitor: Handicapped height Home Equipment: Information systems manager - built in, Agricultural consultant (2 wheels), [employer] - quad  Functional History: Prior Function Prior Level of Function : Independent/Modified  Independent, Driving Mobility Comments: Pt using cane for mobility at baseline. ADLs Comments: Pt reports (I) ADLs and most IADLs. Husband does laundry. Pt drives (husband also drives if needed). (I) medication management. Enjoys church activities. Retired from [employer], [place] [employer]. Functional Status:  Mobility: Bed Mobility Overal bed mobility: Modified Independent Bed Mobility: Supine to Sit Supine to sit: Modified independent (Device/Increase time) Sit to supine: Supervision General bed mobility comments: intermittent abberant trunk control upon coming to EOB, stabilizes Transfers Overall transfer level: Needs assistance Equipment used: Rolling walker (2 wheels) Transfers: Sit to/from Stand Sit to Stand: Min assist General transfer comment: improve from previous day, but still sturggles with postural alignment with midline and control (right  sided weakness) Ambulation/Gait Ambulation/Gait assistance:  (deferred due to urinary incontinence on floor and continued lethargy.) Gait Distance (Feet): 20 Feet Assistive device: Rolling walker (2 wheels) Gait Pattern/deviations: Step-to pattern General Gait Details: Alternating fwd, backward at bedside; severe difficulty with trunk control in midline, frequent LOB to right side, requires maxA 50% of these for steady. Poor awareness and selection of stance width.   ADL: ADL Overall ADL's : Needs assistance/impaired Eating/Feeding Details (indicate cue type and reason): anticipate supervision, bed level only Grooming Details (indicate cue type and reason): anticipate set up-MIN A, bed level only (needs back support) Upper Body Bathing Details (indicate cue type and reason): anticipate MIN A bed level Lower Body Bathing Details (indicate cue type and reason): anticipate MAX A bed level with back support Upper Body Dressing Details (indicate cue type and reason): anticipate MIN A seated (balance deficit) Lower Body Dressing Details (indicate cue type and reason): anticipate MAX A due to standing balance deficit (pt able to make nearly figure four, but not quite able to get BIL LE over opposite knee; particular difficulty with LLE given baseline weakenss and need for BIL UE to assist with moving LE, meaning UE not available to assist with sitting balance). Toilet Transfer Details (indicate cue type and reason): not tested today for safety; would require +2 assist likely for safety, as pt taking one sidestep at EOB was very off-balance and needing MOD A and RW to maintain balance, with posterior lean. Toileting - Clothing Manipulation Details (indicate cue type and reason): anticipate dependent while standing/bed level Tub/Shower Transfer Details (indicate cue type and reason): not yet safe for tub transfer given dizziness/decreased balance Functional mobility during ADLs: Moderate assistance,  Rolling walker (2 wheels) (Only able to take one sidestep with RLE towards HOB; very off-balance, posterior lean.) General ADL Comments: Pt BIL UE strength/ROM WNL; however, pt having blurring of vision and decreased sitting balance (mild) and decreased standing balance (severe), so high risk for LB and standing ADL deficits at this time. Pt feels dizzy even in supine, which worsens with sitting and worsens further with standing.   Cognition: Cognition Overall Cognitive Status: Within Functional Limits for tasks assessed Orientation Level: Oriented X4 Cognition Arousal: Lethargic, Suspect due to medications Behavior During Therapy: WFL for tasks assessed/performed Overall Cognitive Status: Within Functional Limits for tasks assessed General Comments: A+Ox4. Agreeable, quiet.   Blood pressure (!) 150/79, pulse 72, temperature 98.4 F (36.9 C), temperature source Oral, resp. rate 18, height 5\' 6"  (1.676 m), weight 77.1 kg, SpO2 100%. Physical Exam Gen: no distress, normal appearing HEENT: oral mucosa pink and moist, NCAT Cardio: Reg rate Chest: normal effort, normal rate of breathing Abd: soft, non-distended Ext: no edema Psych: pleasant, normal affect Skin: intact Neuro: Alert and oriented x3 Musculoskeletal: 5/5 strength right side,  4/5 LUE, LLE 2/5 proximally and 4/5 distally   Lab Results Last 24 Hours       Results for orders placed or performed during the hospital encounter of 09/08/22 (from the past 24 hour(s))  CBC     Status: Abnormal    Collection Time: 09/10/22  5:08 AM  Result Value Ref Range    WBC 4.0 4.0 - 10.5 K/uL    RBC 3.88 3.87 - 5.11 MIL/uL    Hemoglobin 9.3 (L) 12.0 - 15.0 g/dL    HCT 82.9 (L) 56.2 - 46.0 %    MCV 78.1 (L) 80.0 - 100.0 fL    MCH 24.0 (L) 26.0 - 34.0 pg    MCHC 30.7 30.0 - 36.0 g/dL    RDW 13.0 (H) 86.5 - 15.5 %    Platelets 199 150 - 400 K/uL    nRBC 0.0 0.0 - 0.2 %  Basic metabolic panel     Status: Abnormal    Collection Time:  09/10/22  5:08 AM  Result Value Ref Range    Sodium 138 135 - 145 mmol/L    Potassium 3.4 (L) 3.5 - 5.1 mmol/L    Chloride 106 98 - 111 mmol/L    CO2 24 22 - 32 mmol/L    Glucose, Bld 85 70 - 99 mg/dL    BUN 10 8 - 23 mg/dL    Creatinine, Ser 7.84 0.44 - 1.00 mg/dL    Calcium 8.4 (L) 8.9 - 10.3 mg/dL    GFR, Estimated >69 >62 mL/min    Anion gap 8 5 - 15       Imaging Results (Last 48 hours)  ECHOCARDIOGRAM COMPLETE   Result Date: 09/10/2022    ECHOCARDIOGRAM REPORT   Patient Name:   Susan Farrell Date of Exam: 09/10/2022 Medical Rec #:  [id number]        Height:       66.0 in Accession #:    [id number]       Weight:       170.0 lb Date of Birth:  [birth year]        BSA:          1.866 m Patient Age:    76 years         BP:           146/63 mmHg Patient Gender: F                HR:           67 bpm. Exam Location:  [facility] Procedure: 2D Echo, Cardiac Doppler, Color Doppler and Saline Contrast Bubble            Study Indications:     Stroke I63.9  History:         Patient has prior history of Echocardiogram examinations, most                  recent 08/29/2021. Stroke; Risk Factors:Hypertension.  Sonographer:     Cristela Blue Referring Phys:  [id number] Gordy Councilman Diagnosing Phys: Julien Nordmann MD IMPRESSIONS  1. Left ventricular ejection fraction, by estimation, is 60 to 65%. The left ventricle has normal function. The left ventricle has no regional wall motion abnormalities. There is moderate left ventricular hypertrophy. Left ventricular diastolic parameters are consistent with Grade I diastolic dysfunction (impaired relaxation).  2. Right ventricular systolic function is normal. The right ventricular size is normal. There is normal pulmonary artery systolic pressure. The estimated right  ventricular systolic pressure is 20.4 mmHg.  3. The mitral valve is normal in structure. Mild mitral valve regurgitation. No evidence of mitral stenosis.  4. The aortic valve is normal in structure. There is mild  calcification of the aortic valve. Aortic valve regurgitation is not visualized. Aortic valve sclerosis is present, with no evidence of aortic valve stenosis.  5. The inferior vena cava is normal in size with greater than 50% respiratory variability, suggesting right atrial pressure of 3 mmHg.  6. Agitated saline contrast bubble study was negative, with no evidence of any interatrial shunt. FINDINGS  Left Ventricle: Left ventricular ejection fraction, by estimation, is 60 to 65%. The left ventricle has normal function. The left ventricle has no regional wall motion abnormalities. The left ventricular internal cavity size was normal in size. There is  moderate left ventricular hypertrophy. Left ventricular diastolic parameters are consistent with Grade I diastolic dysfunction (impaired relaxation). Right Ventricle: The right ventricular size is normal. No increase in right ventricular wall thickness. Right ventricular systolic function is normal. There is normal pulmonary artery systolic pressure. The tricuspid regurgitant velocity is 1.96 m/s, and  with an assumed right atrial pressure of 5 mmHg, the estimated right ventricular systolic pressure is 20.4 mmHg. Left Atrium: Left atrial size was normal in size. Right Atrium: Right atrial size was normal in size. Pericardium: There is no evidence of pericardial effusion. Mitral Valve: The mitral valve is normal in structure. There is mild calcification of the mitral valve leaflet(s). Mild mitral valve regurgitation. No evidence of mitral valve stenosis. MV peak gradient, 6.2 mmHg. The mean mitral valve gradient is 2.0 mmHg. Tricuspid Valve: The tricuspid valve is normal in structure. Tricuspid valve regurgitation is mild . No evidence of tricuspid stenosis. Aortic Valve: The aortic valve is normal in structure. There is mild calcification of the aortic valve. Aortic valve regurgitation is not visualized. Aortic valve sclerosis is present, with no evidence of aortic  valve stenosis. Aortic valve mean gradient  measures 5.5 mmHg. Aortic valve peak gradient measures 9.9 mmHg. Aortic valve area, by VTI measures 2.60 cm. Pulmonic Valve: The pulmonic valve was normal in structure. Pulmonic valve regurgitation is not visualized. No evidence of pulmonic stenosis. Aorta: The aortic root is normal in size and structure. Venous: The inferior vena cava is normal in size with greater than 50% respiratory variability, suggesting right atrial pressure of 3 mmHg. IAS/Shunts: No atrial level shunt detected by color flow Doppler. Agitated saline contrast was given intravenously to evaluate for intracardiac shunting. Agitated saline contrast bubble study was negative, with no evidence of any interatrial shunt. There  is no evidence of a patent foramen ovale. There is no evidence of an atrial septal defect.  LEFT VENTRICLE PLAX 2D LVIDd:         3.90 cm   Diastology LVIDs:         2.50 cm   LV e' medial:    5.98 cm/s LV PW:         1.30 cm   LV E/e' medial:  14.4 LV IVS:        1.70 cm   LV e' lateral:   8.92 cm/s LVOT diam:     2.00 cm   LV E/e' lateral: 9.6 LV SV:         82 LV SV Index:   44 LVOT Area:     3.14 cm  RIGHT VENTRICLE RV Basal diam:  3.70 cm RV Mid diam:    2.40 cm  RV S prime:     15.80 cm/s TAPSE (M-mode): 1.8 cm LEFT ATRIUM             Index        RIGHT ATRIUM           Index LA diam:        3.00 cm 1.61 cm/m   RA Area:     19.30 cm LA Vol (A2C):   33.9 ml 18.16 ml/m  RA Volume:   55.20 ml  29.58 ml/m LA Vol (A4C):   53.8 ml 28.83 ml/m LA Biplane Vol: 44.2 ml 23.68 ml/m  AORTIC VALVE AV Area (Vmax):    2.21 cm AV Area (Vmean):   2.19 cm AV Area (VTI):     2.60 cm AV Vmax:           157.50 cm/s AV Vmean:          108.650 cm/s AV VTI:            0.317 m AV Peak Grad:      9.9 mmHg AV Mean Grad:      5.5 mmHg LVOT Vmax:         111.00 cm/s LVOT Vmean:        75.600 cm/s LVOT VTI:          0.262 m LVOT/AV VTI ratio: 0.83  AORTA Ao Root diam: 3.10 cm MITRAL VALVE                 TRICUSPID VALVE MV Area (PHT): 2.59 cm     TR Peak grad:   15.4 mmHg MV Area VTI:   2.79 cm     TR Vmax:        196.00 cm/s MV Peak grad:  6.2 mmHg MV Mean grad:  2.0 mmHg     SHUNTS MV Vmax:       1.25 m/s     Systemic VTI:  0.26 m MV Vmean:      66.5 cm/s    Systemic Diam: 2.00 cm MV Decel Time: 293 msec MV E velocity: 85.90 cm/s MV A velocity: 116.00 cm/s MV E/A ratio:  0.74 Julien Nordmann MD Electronically signed by Julien Nordmann MD Signature Date/Time: 09/10/2022/12:01:02 PM    Final     CT Angio Head Neck W WO CM   Result Date: 09/09/2022 CLINICAL DATA:  Central vertigo, emesis EXAM: CT ANGIOGRAPHY HEAD AND NECK WITH AND WITHOUT CONTRAST TECHNIQUE: Multidetector CT imaging of the head and neck was performed using the standard protocol during bolus administration of intravenous contrast. Multiplanar CT image reconstructions and MIPs were obtained to evaluate the vascular anatomy. Carotid stenosis measurements (when applicable) are obtained utilizing NASCET criteria, using the distal internal carotid diameter as the denominator. RADIATION DOSE REDUCTION: This exam was performed according to the departmental dose-optimization program which includes automated exposure control, adjustment of the mA and/or kV according to patient size and/or use of iterative reconstruction technique. CONTRAST:  75mL OMNIPAQUE IOHEXOL 350 MG/ML SOLN COMPARISON:  11/09/2021 CT head, 08/28/2021 CTA head and neck, 09/09/2022 MRI head FINDINGS: CT HEAD FINDINGS Brain: Hypodensity in the superior right cerebellum and vermis, consistent with the acute right SCA territory infarct seen on the same-day MRI. No evidence of acute hemorrhage, mass, mass effect, or midline shift. No hydrocephalus or extra-axial fluid collection. Sequela of remote right basal ganglia infarct. Vascular: No hyperdense vessel. Skull: Negative for fracture or focal lesion. Sinuses/Orbits: No acute finding. Other: The mastoid air cells are  well aerated. CTA  NECK FINDINGS Aortic arch: Standard branching. Imaged portion shows no evidence of aneurysm or dissection. No significant stenosis of the major arch vessel origins. Right carotid system: No evidence of dissection, occlusion, or hemodynamically significant stenosis (greater than 50%). Left carotid system: No evidence of dissection, occlusion, or hemodynamically significant stenosis (greater than 50%). Vertebral arteries: No evidence of dissection, occlusion, or hemodynamically significant stenosis (greater than 50%). Skeleton: No acute osseous abnormality. Degenerative changes in the cervical spine. Other neck: Negative. Upper chest: No focal pulmonary opacity or pleural effusion. Review of the MIP images confirms the above findings CTA HEAD FINDINGS Evaluation is somewhat limited by bolus timing and motion. This particularly affects the distal anterior circulation. Anterior circulation: Both internal carotid arteries are patent to the termini, without significant stenosis. A1 segments patent. Normal anterior communicating artery. Anterior cerebral arteries are patent through the A2 segments, with more distal segments limited by motion. No M1 stenosis or occlusion. MCA branches perfused through the M2 segments, with more distal segments limited by motion. Posterior circulation: Vertebral arteries patent to the vertebrobasilar junction without significant stenosis. Posterior inferior cerebellar arteries patent proximally. Basilar patent to its distal aspect without significant stenosis. Superior cerebellar arteries patent proximally, although the right proximal SCA is decreased in caliber compared to the prior exam (series 10, image 126) Patent P1 segments. Moderate to severe stenosis in the proximal right P3 (series 10, image 114 and series 11, image 24). PCAs perfused to their distal aspects without significant stenosis. The bilateral posterior communicating arteries are not visualized. Venous sinuses: As permitted  by contrast timing and motion, patent. Anatomic variants: None significant. Review of the MIP images confirms the above findings IMPRESSION: 1. Hypodensity in the superior right cerebellum and vermis, consistent with the acute right SCA territory infarct seen on the same-day MRI. 2. Decreased caliber of proximal right SCA compared to 08/28/2021, although the vessel does appear patent proximally. 3. No intracranial large vessel occlusion. 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 4. No hemodynamically significant stenosis in the neck. These results were called by telephone at the time of interpretation on 09/09/2022 at 1:06 am to provider [facility] , who verbally acknowledged these results. Electronically Signed   By: Wiliam Ke M.D.   On: 09/09/2022 01:17    MR BRAIN WO CONTRAST   Result Date: 09/09/2022 CLINICAL DATA:  Dizziness, stroke suspected, vomiting EXAM: MRI HEAD WITHOUT CONTRAST TECHNIQUE: Multiplanar, multiecho pulse sequences of the brain and surrounding structures were obtained without intravenous contrast. COMPARISON:  08/29/2021 MRI head, correlation is also made with 11/09/2021 CT head FINDINGS: Brain: Restricted diffusion with ADC correlate in the right superior cerebellum and vermis, which correlates the right superior cerebellar artery territory. This area is associated with increased T2 hyperintense signal, likely cytotoxic edema. No evidence of hemorrhagic transformation. No acute hemorrhage, mass, mass effect, or midline shift. No hydrocephalus or extra-axial collection. Partial empty sella. Normal craniocervical junction. No hemosiderin deposition to suggest remote hemorrhage. Remote infarct in the right basal ganglia. Somewhat disproportionate volume loss in the posterior left frontal lobe (series 15, image 39). Scattered T2 hyperintense signal in the periventricular white matter, likely the sequela of chronic small vessel ischemic disease. Vascular: Normal arterial flow  voids. Skull and upper cervical spine: Normal marrow signal. Sinuses/Orbits: Clear paranasal sinuses. No acute finding in the orbits. Other: The mastoid air cells are well aerated. IMPRESSION: Acute infarct in the right superior cerebellum and vermis, which correlates with the right superior  cerebellar artery territory. No evidence of hemorrhagic transformation. These results were called by telephone at the time of interpretation on 09/09/2022 at 1:06 am to provider [facility] , who verbally acknowledged these results. Electronically Signed   By: Wiliam Ke M.D.   On: 09/09/2022 01:07    DG Chest 2 View   Result Date: 09/08/2022 CLINICAL DATA:  Dizziness EXAM: CHEST - 2 VIEW COMPARISON:  08/28/2021 FINDINGS: Cardiomegaly. Mediastinal contours within normal limits. Linear left lower lung opacities, favor atelectasis. Right lung clear. No effusions or edema. No acute bony abnormality. IMPRESSION: Cardiomegaly. Linear left lower lung opacities, favor atelectasis. Electronically Signed   By: Charlett Nose M.D.   On: 09/08/2022 22:22       Assessment/Plan: Diagnosis: Acute cerebellar CVA Does the need for close, 24 hr/day medical supervision in concert with the patient's rehab needs make it unreasonable for this patient to be served in a less intensive setting? Yes Co-Morbidities requiring supervision/potential complications:  1) Dizziness 2) visual deficits 3) nausea and vomiting 4) overweight: provide dietary education 5) hypothyroidism: continue synthroid 6) constipation: continue colace Due to bladder management, bowel management, safety, skin/wound care, disease management, medication administration, pain management, and patient education, does the patient require 24 hr/day rehab nursing? Yes Does the patient require coordinated care of a physician, rehab nurse, therapy disciplines of PT, OT to address physical and functional deficits in the context of the above medical diagnosis(es)?  Yes Addressing deficits in the following areas: balance, endurance, locomotion, strength, transferring, bowel/bladder control, bathing, dressing, feeding, grooming, toileting, and psychosocial support Can the patient actively participate in an intensive therapy program of at least 3 hrs of therapy per day at least 5 days per week? Yes The potential for patient to make measurable gains while on inpatient rehab is excellent Anticipated functional outcomes upon discharge from inpatient rehab are modified independent  with PT, modified independent with OT, modified independent with SLP. Estimated rehab length of stay to reach the above functional goals is: 10-12 days Anticipated discharge destination: Home Overall Rehab/Functional Prognosis: excellent   POST ACUTE RECOMMENDATIONS: This patient's condition is appropriate for continued rehabilitative care in the following setting: CIR Patient has agreed to participate in recommended program. Yes Note that insurance prior authorization may be required for reimbursement for recommended care.       I have personally performed a face to face diagnostic evaluation of this patient. Additionally, I have examined the patient's medical record including any pertinent labs and radiographic images. If the physician assistant has documented in this note, I have reviewed and edited or otherwise concur with the physician assistant's documentation.   Thanks,   Horton Chin, MD 09/10/2022

## 2022-09-12 NOTE — Care Management Important Message (Signed)
Important Message  Patient Details  Name: Susan Farrell MRN: [id number] Date of Birth: 09-09-1945   Medicare Important Message Given:  Yes     Johnell Comings 09/12/2022, 11:31 AM

## 2022-09-12 NOTE — Progress Notes (Signed)
Inpatient Rehab Admissions Coordinator:  There is a bed available for pt in CIR today. Dr. Allena Katz aware and in agreement. Pt, pt's husband, NSG, and TOC made aware.   Wolfgang Phoenix, MS, CCC-SLP Admissions Coordinator [phone]

## 2022-09-12 NOTE — Consult Note (Signed)
[facility] Customer service manager [facility]) Accountable Care Organization (ACO) [facility] Liaison Note  09/12/2022  Susan Farrell 1946/01/15 [id number]  Location: [facility] RN Hospital Liaison screened the patient remotely at [facility].  Insurance: [facility]   Susan Farrell is a 77 y.o. female who is a Primary Care Patient of Hande, Roderic Palau, MD. The patient was screened for readmission hospitalization with noted low risk score for unplanned readmission risk with 1 IP in 6 months.  The patient was assessed for potential [facility]) Care Management service needs for post hospital transition for care coordination. Review of patient's electronic medical record reveals patient was admitted with Cerebella stroke.  TOC notes indicated pt awaiting CIR for ongoing therapy. Facility will provide his ongoing needs.    [facility] Care Management/Population Health does not replace or interfere with any arrangements made by the Inpatient Transition of Care team.   For questions contact:   Elliot Cousin, RN, [facility] Liaison [place]   Population Health Office Hours MTWF  8:00 am-6:00 pm Off on Thursday [phone] mobile [phone] [Office toll free line] Office Hours are M-F 8:30 - 5 pm [email or web address]

## 2022-09-12 NOTE — Progress Notes (Addendum)
Inpatient Rehabilitation Admission Medication Review by a Pharmacist  A complete drug regimen review was completed for this patient to identify any potential clinically significant medication issues.  High Risk Drug Classes Is patient taking? Indication by Medication  Antipsychotic No   Anticoagulant Yes Lovenox - VTE ppx  Antibiotic No   Opioid No   Antiplatelet Yes Aspirin, clopidogrel (3 months planned)- CVA  Hypoglycemics/insulin No   Vasoactive Medication Yes Metoprolol, amlodipine - HTN  Chemotherapy No   Other Yes Levothyroxine - low thyroid Rosuvastatin - HLD Varenicline - tobacco abuse     Type of Medication Issue Identified Description of Issue Recommendation(s)  Drug Interaction(s) (clinically significant)     Duplicate Therapy     Allergy     No Medication Administration End Date     Incorrect Dose     Additional Drug Therapy Needed     Significant med changes from prior encounter (inform family/care partners about these prior to discharge).    Other       Clinically significant medication issues were identified that warrant physician communication and completion of prescribed/recommended actions by midnight of the next day:  No  Name of provider notified for urgent issues identified:   Provider Method of Notification:     Pharmacist comments: None  Time spent performing this drug regimen review (minutes):  20 minutes  Okey Regal, PharmD

## 2022-09-12 NOTE — Progress Notes (Signed)
Physical Therapy Treatment Patient Details Name: Susan Farrell MRN: [id number] DOB: 03/22/1945 Today's Date: 09/12/2022   History of Present Illness Susan Farrell is a 76yoF who [facility] ED on 8/19 after acute onset N/V and dizziness. 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    PT Comments  Pt at [facility] on entry, getting med from RN. Pt reports dizziness still steadily improving, also not as sleepy today compared to previous day. Pt partakes in dynamic balance activity in room, continued to show improvement with righting and postural control, proprioceptive awareness. Session ended early to facilitate DC to CIR later today.     If plan is discharge home, recommend the following: A lot of help with walking and/or transfers;A lot of help with bathing/dressing/bathroom;Help with stairs or ramp for entrance   Can travel by private vehicle        Equipment Recommendations  None recommended by PT    Recommendations for Other Services Rehab consult     Precautions / Restrictions Precautions Precautions: Fall Restrictions Weight Bearing Restrictions: No     Mobility  Bed Mobility Overal bed mobility: Modified Independent Bed Mobility: Supine to Sit     Supine to sit: Modified independent (Device/Increase time)          Transfers Overall transfer level: Needs assistance Equipment used: Rolling walker (2 wheels) Transfers: Sit to/from Stand Sit to Stand: Supervision           General transfer comment: slight elevation EOB 2 sets of 2 c RW, no assit required for balance    Ambulation/Gait Ambulation/Gait assistance: Contact guard assist Gait Distance (Feet): 40 Feet Assistive device: Rolling walker (2 wheels) (or countertop) Gait Pattern/deviations: Step-to pattern       General Gait Details: *see exercises section   Stairs              Wheelchair Mobility     Tilt Bed    Modified Rankin (Stroke Patients Only)       Balance                                            Cognition Arousal: Alert Behavior During Therapy: WFL for tasks assessed/performed Overall Cognitive Status: Within Functional Limits for tasks assessed                                          Exercises Other Exercises Other Exercises: STS from EOB 2x2 c RW, supervision assistance Other Exercises: AMB forward to window countert top, then lateral side stepping at window with counter support 3x65ft bilat    General Comments        Pertinent Vitals/Pain Pain Assessment Pain Assessment: No/denies pain    Home Living                          Prior Function            PT Goals (current goals can now be found in the care plan section) Acute Rehab PT Goals Patient Stated Goal: regain functional independence and activity tolerance PT Goal Formulation: With patient Time For Goal Achievement: 09/23/22 Potential to Achieve  Goals: Good Progress towards PT goals: Progressing toward goals    Frequency           PT Plan      Co-evaluation              AM-PAC PT "6 Clicks" Mobility   Outcome Measure  Help needed turning from your back to your side while in a flat bed without using bedrails?: A Little Help needed moving from lying on your back to sitting on the side of a flat bed without using bedrails?: A Little Help needed moving to and from a bed to a chair (including a wheelchair)?: A Little Help needed standing up from a chair using your arms (e.g., wheelchair or bedside chair)?: A Little Help needed to walk in hospital room?: A Lot Help needed climbing 3-5 steps with a railing? : A Lot 6 Click Score: 16    End of Session   Activity Tolerance: Patient tolerated treatment well;No increased pain Patient left: in chair;with call bell/phone within reach;with  family/visitor present Nurse Communication: Mobility status PT Visit Diagnosis: Difficulty in walking, not elsewhere classified (R26.2);Other abnormalities of gait and mobility (R26.89);Unsteadiness on feet (R26.81);Dizziness and giddiness (R42)     Time: 8119-1478 PT Time Calculation (min) (ACUTE ONLY): 18 min  Charges:    $Neuromuscular Re-education: 8-22 mins PT General Charges $$ ACUTE PT VISIT: 1 Visit                    12:00 PM, 09/12/22 [name], PT, DPT Physical Therapist - [facility]  [phone] (ASCOM)    Oshea Percival C 09/12/2022, 11:59 AM

## 2022-09-12 NOTE — Progress Notes (Signed)
Signed     Expand All Collapse All PMR Admission Coordinator Pre-Admission Assessment   Patient: Susan Farrell is an 77 y.o., female MRN: [id number] DOB: 1945/06/24 Height: 5\' 6"  (167.6 cm) Weight: 77.1 kg   Insurance Information HMO:     PPO: yes     PCP:      IPA:      80/20:      OTHER:  PRIMARY: [employer]      Policy#: [id number]      Subscriber: patient CM Name: D. Steinaway      Phone#: [phone] ext [phone]     Fax#: [phone] Pre-Cert#: [id number] auth fax received 09/11/22, good for 5 days.  Once admitted update weekly to Danella Penton at fax [phone] and office # [phone] ext [phone]       Employer: Retired Benefits:  Phone #: [phone]     Name: On line at [email or web address] Eff Date: 01/20/2022- still active   Deductible: does not have one   OOP Max: $3,600 ($360 met)   CIR: $295/day co-pay with a max co-pay of $2,065/admission (7 days)   SNF: $20/day co-pay for days 1-20, $203/day co-pay for days 21-100, limited to 100 days/cal yr   Outpatient:  $25 copay/visit Home Health:  100% coverage   DME: 80% coverage; 20% co-insurance   Providers: in network    SECONDARY: [employer]   Policy#: [id number]     Phone#: [phone]     Financial Counselor:       Phone#:    The "Data Collection Information Summary" for patients in Inpatient Rehabilitation Facilities with attached "Privacy Act Statement-Health Care Records" was provided and verbally reviewed with: Patient   Emergency Contact Information Contact Information       Name Relation Home Work Mobile    Loraine Grip Spouse [phone]   [phone]         Other Contacts   None on File        Current Medical History  Patient Admitting Diagnosis: CVA                   History of Present Illness: Susan Farrell is a 77 y.o. 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  Onset was at 5:30 PM on 09/08/2022.  She had associated nausea and went on to have 3 episodes of nonbloody nonbilious vomiting.  Since the onset she has continued to feel dizzy and whereas previously she could stand with the assistance of her cane she is unable to maintain her balance when standing and has to remain seated.  She was previously in her usual state of health.  She denies headache or visual disturbance.  She has no diarrhea, dysuria, abdominal pain, fever or chills.  Patient arrived to the ED at 8:55 PM however was not evaluated until later on around 11 PM when concern arose for possible CVA. In the ED, vitals within normal limits, CBC, BMP, TSH, troponin mostly unremarkable except for mild anemia with hemoglobin of 10.1,  which is about her baseline. EKG Showed sinus at 70 with T wave inversion in anterolateral leads.CTA head and neck and MRI were ordered which were consistent with stroke. MRI  showed Acute infarct in  the right superior cerebellum and vermis, which correlates with the right superior cerebellar artery territory. No evidence of hemorrhagic transformation. Pt was seen by PT/OT and They recommend CIR to assist return to PLOF.      By the time of the imaging report, patient was well out of the window for thrombolytic therapy. Complete NIHSS TOTAL: 1   Patient's medical record from [facility] has been reviewed by the rehabilitation admission coordinator and physician.   Past Medical History      Past Medical History:  Diagnosis Date   Hypertension     Stroke [facility])            Has the patient had major surgery during 100 days prior to admission? No   Family History   family history includes Diabetes in her sister; Hypertension in her  father and mother.   Current Medications  Current Medications    Current Facility-Administered Medications:    acetaminophen (TYLENOL) tablet 650 mg, 650 mg, Oral, Q4H PRN, 650 mg at 09/09/22 0239 **OR** acetaminophen (TYLENOL) 160 MG/5ML solution 650 mg, 650 mg, Per Tube, Q4H PRN **OR** acetaminophen (TYLENOL) suppository 650 mg, 650 mg, Rectal, Q4H PRN, Andris Baumann, MD   aspirin EC tablet 81 mg, 81 mg, Oral, Daily, Renae Gloss, Richard, MD, 81 mg at 09/11/22 1059   calcium-vitamin D (OSCAL WITH D) 500-5 MG-MCG per tablet 1 tablet, 1 tablet, Oral, Daily, Wieting, Richard, MD, 1 tablet at 09/11/22 1059   [COMPLETED] clopidogrel (PLAVIX) tablet 300 mg, 300 mg, Oral, Once, 300 mg at 09/09/22 1401 **FOLLOWED BY** clopidogrel (PLAVIX) tablet 75 mg, 75 mg, Oral, Daily, Bhagat, Srishti L, MD, 75 mg at 09/11/22 1059   docusate sodium (COLACE) capsule 100 mg, 100 mg, Oral, Daily, Wieting, Richard, MD, 100 mg at 09/11/22 1059   enoxaparin (LOVENOX) injection 40 mg, 40 mg, Subcutaneous, Q24H, Lindajo Royal V, MD, 40 mg at 09/11/22 1101   iron polysaccharides (NIFEREX) capsule 150 mg, 150 mg, Oral, Daily, Wieting, Richard, MD, 150 mg at 09/11/22 1100   levothyroxine (SYNTHROID) tablet 100 mcg, 100 mcg, Oral, Q0600, Alford Highland, MD, 100 mcg at 09/11/22 1610   multivitamin with minerals tablet 1 tablet, 1 tablet, Oral, Daily, Wieting, Richard, MD, 1 tablet at 09/11/22 1059   ondansetron (ZOFRAN) injection 4 mg, 4 mg, Intravenous, Q6H PRN, Renae Gloss, Richard, MD, 4 mg at 09/09/22 1717   rosuvastatin (CRESTOR) tablet 40 mg, 40 mg, Oral, Daily, Wieting, Richard, MD, 40 mg at 09/11/22 1059   varenicline (CHANTIX) tablet 1 mg, 1 mg, Oral, BID, Wieting, Richard, MD, 1 mg at 09/11/22 1100     Patients Current Diet:  Diet Order                  Diet Heart Room service appropriate? Yes; Fluid consistency: Thin  Diet effective now                         Precautions /  Restrictions Precautions Precautions: Fall Restrictions Weight Bearing Restrictions: No    Has the patient had 2 or more falls or a fall with injury in the past year? No   Prior Activity Level Community (5-7x/wk): Pt. acitve in the commuinity PTA   Prior Functional Level Self Care: Did the patient need help bathing, dressing, using the toilet or eating? Independent   Indoor Mobility: Did the patient need assistance with walking from room to room (with or without device)? Independent   Stairs:  Did the patient need assistance with internal or external stairs (with or without device)? Independent   Functional Cognition: Did the patient need help planning regular tasks such as shopping or remembering to take medications? Independent   Patient Information Are you of Hispanic, Latino/a,or Spanish origin?: A. No, not of Hispanic, Latino/a, or Spanish origin What is your race?: B. Black or African American Do you need or want an interpreter to communicate with a doctor or health care staff?: 0. No   Patient's Response To:  Health Literacy and Transportation Is the patient able to respond to health literacy and transportation needs?: Yes Health Literacy - How often do you need to have someone help you when you read instructions, pamphlets, or other written material from your doctor or pharmacy?: Never In the past 12 months, has lack of transportation kept you from medical appointments or from getting medications?: No In the past 12 months, has lack of transportation kept you from meetings, work, or from getting things needed for daily living?: No   Journalist, newspaper / Equipment Home Equipment: Shower seat - built in, Agricultural consultant (2 wheels), [employer] - quad   Prior Device Use: Indicate devices/aids used by the patient prior to current illness, exacerbation or injury? None of the above   Current Functional Level Cognition   Overall Cognitive Status: Within Functional Limits for tasks  assessed Orientation Level: Oriented X4 General Comments: A+Ox4. Agreeable, motivated to "do as much as I can".    Extremity Assessment (includes Sensation/Coordination)   Upper Extremity Assessment: Overall WFL for tasks assessed, Right hand dominant  Lower Extremity Assessment: Defer to PT evaluation (Pt has baseline RLE deficits from prior CVA)     ADLs   Overall ADL's : Needs assistance/impaired Eating/Feeding: Sitting, Set up, Supervision/ safety Eating/Feeding Details (indicate cue type and reason): anticipate supervision, bed level only Grooming: Sitting, Minimal assistance Grooming Details (indicate cue type and reason): brushing, applying dentures in sitting; MIN A for bimanual tasks Upper Body Bathing Details (indicate cue type and reason): anticipate MIN A bed level Lower Body Bathing Details (indicate cue type and reason): anticipate MAX A bed level with back support Upper Body Dressing Details (indicate cue type and reason): anticipate MIN A seated (balance deficit) Lower Body Dressing Details (indicate cue type and reason): anticipate MAX A due to standing balance deficit (pt able to make nearly figure four, but not quite able to get BIL LE over opposite knee; particular difficulty with LLE given baseline weakenss and need for BIL UE to assist with moving LE, meaning UE not available to assist with sitting balance). Toilet Transfer: Minimal assistance, Rolling walker (2 wheels), Moderate assistance Toilet Transfer Details (indicate cue type and reason): short amb transfer with RW Toileting- Clothing Manipulation and Hygiene: Moderate assistance, Sit to/from stand Toileting - Clothing Manipulation Details (indicate cue type and reason): underwear Tub/Shower Transfer Details (indicate cue type and reason): not yet safe for tub transfer given dizziness/decreased balance Functional mobility during ADLs: Moderate assistance, Rolling walker (2 wheels) General ADL Comments: Able to  maintain sitting balance at EOB without physical assist. Has difficulty when reaching for items with RUE, MOD A for STS from EOB at lowest position. Initial instability in standing and with forward weight shift, but pt is able to take several steps toward [facility] with MOD A before safely lowering to sitting position. Maintains static standing balance at EOB during linen change. Pt noted to have wet bed linens from purewick malfunction. Suspect pt with decreased  sensation/awareness of bowel/bladder management.     Mobility   Overal bed mobility: Modified Independent Bed Mobility: Supine to Sit, Sit to Supine Supine to sit: Modified independent (Device/Increase time) Sit to supine: Modified independent (Device/Increase time) General bed mobility comments: moving better than AM session     Transfers   Overall transfer level: Needs assistance Equipment used: Rolling walker (2 wheels) Transfers: Sit to/from Stand Sit to Stand: Supervision, Contact guard assist General transfer comment: 4x from recliner in session, 1x from EOB     Ambulation / Gait / Stairs / Wheelchair Mobility   Ambulation/Gait Ambulation/Gait assistance: Contact guard assist, Max assist, +2 safety/equipment (intermittent maxA for LOB recovery 5-6x over 2 walks, otherwise steady with RW and concerted effort) Gait Distance (Feet): 15 Feet (12', 15', 12') Assistive device: Rolling walker (2 wheels) Gait Pattern/deviations: Step-to pattern General Gait Details: AMB in hallway with chair follow; more limited by persistent sleepy state today, but no LOB that warranted author to intervene for recovery or stabilization.     Posture / Balance Dynamic Sitting Balance Sitting balance - Comments: dynamic sitting balance tasks via ADLs Balance Overall balance assessment: Needs assistance Sitting-balance support: Feet supported, Bilateral upper extremity supported Sitting balance-Leahy Scale: Fair Sitting balance - Comments: dynamic sitting  balance tasks via ADLs Standing balance support: Bilateral upper extremity supported, Reliant on assistive device for balance Standing balance-Leahy Scale: Poor Standing balance comment: with RW     Special needs/care consideration Bladder incontinence, External Urinary Catheter, Skin: Abrasion-generalized, Ecchymosis    Previous Home Environment (from acute therapy documentation) Living Arrangements: Spouse/significant other Available Help at Discharge: Family Type of Home: House Home Layout: One level Home Access: Stairs to enter Entrance Stairs-Rails: None Entrance Stairs-Number of Steps: 2-3 Bathroom Shower/Tub: Health visitor: Handicapped height Home Care Services: No   Discharge Living Setting Plans for Discharge Living Setting: Patient's home Type of Home at Discharge: House Discharge Home Layout: One level Discharge Home Access: Stairs to enter Entrance Stairs-Rails: None Entrance Stairs-Number of Steps: 2-3 Discharge Bathroom Shower/Tub: Walk-in shower Discharge Bathroom Toilet: Handicapped height Discharge Bathroom Accessibility: Yes Does the patient have any problems obtaining your medications?: No   Social/Family/Support Systems Patient Roles: Spouse Contact Information: [phone] Anticipated Caregiver: 24/7 Ability/Limitations of Caregiver: Min A Caregiver Availability: 24/7 Discharge Plan Discussed with Primary Caregiver: Yes Is Caregiver In Agreement with Plan?: Yes Does Caregiver/Family have Issues with Lodging/Transportation while Pt is in Rehab?: No   Goals Patient/Family Goal for Rehab: PT/OT Mod I Expected length of stay: 7-10 daus Pt/Family Agrees to Admission and willing to participate: Yes Program Orientation Provided & Reviewed with Pt/Caregiver Including Roles  & Responsibilities: Yes   Decrease burden of Care through IP rehab admission: not anticipated   Possible need for SNF placement upon discharge: not anticipated    Patient Condition: I have reviewed medical records from [facility], spoken with CM, and patient and spouse. I discussed via phone for inpatient rehabilitation assessment.  Patient will benefit from ongoing PT, OT, and SLP, can actively participate in 3 hours of therapy a day 5 days of the week, and can make measurable gains during the admission.  Patient will also benefit from the coordinated team approach during an Inpatient Acute Rehabilitation admission.  The patient will receive intensive therapy as well as Rehabilitation physician, nursing, social worker, and care management interventions.  Due to safety, skin/wound care, disease management, medication administration, pain management, and patient education the patient requires 24 hour a day  rehabilitation nursing.  The patient is currently Supervision-Contact G with mobility and Min-Mod  A with basic ADLs.  Discharge setting and therapy post discharge at home with home health is anticipated.  Patient has agreed to participate in the Acute Inpatient Rehabilitation Program and will admit today.   Preadmission Screen Completed By:  Trish Mage, with updates by Wolfgang Phoenix 09/11/2022 3:56 PM ______________________________________________________________________   Discussed status with Dr. Shearon Stalls on 09/12/22  at 9:48 AM and received approval for admission today.   Admission Coordinator:  Trish Mage, RN, time 9:48 AM/Date 09/12/22     Assessment/Plan: Diagnosis: CVA of the right SCA effecting the superior cerebellum and vermis  Does the need for close, 24 hr/day Medical supervision in concert with the patient's rehab needs make it unreasonable for this patient to be served in a less intensive setting? Yes Co-Morbidities requiring supervision/potential complications: hypertension, discoid lupus, hypothyroidism, AAA status post rupture and repair, tobacco abuse, Dizziness, visual deficits, nausea and vomiting,  hypothyroidism, urinary incontinence and constipation  Due to bladder management, bowel management, safety, skin/wound care, disease management, medication administration, pain management, and patient education, does the patient require 24 hr/day rehab nursing? Yes Does the patient require coordinated care of a physician, rehab nurse, PT, OT, and SLP to address physical and functional deficits in the context of the above medical diagnosis(es)? Yes Addressing deficits in the following areas: balance, endurance, locomotion, strength, transferring, bowel/bladder control, bathing, dressing, feeding, grooming, toileting, and cognition Can the patient actively participate in an intensive therapy program of at least 3 hrs of therapy 5 days a week? Yes The potential for patient to make measurable gains while on inpatient rehab is good Anticipated functional outcomes upon discharge from inpatient rehab: modified independent PT, modified independent OT, modified independent SLP Estimated rehab length of stay to reach the above functional goals is: 10-14 days Anticipated discharge destination: Home 10. Overall Rehab/Functional Prognosis: good     MD Signature:   Angelina Sheriff, DO 09/12/2022

## 2022-09-12 NOTE — Progress Notes (Signed)
[facility] is here to pick patient up to transport to [facility]. Patient is ready for transport. She will transport out with LFA IV per request of nurse at [facility]. Patient sent all personal items home with husband. She is alert and oriented and ready for transport. All paperwork has been printed and provided to [facility] for transport.

## 2022-09-13 DIAGNOSIS — I10 Essential (primary) hypertension: Secondary | ICD-10-CM | POA: Diagnosis not present

## 2022-09-13 DIAGNOSIS — L930 Discoid lupus erythematosus: Secondary | ICD-10-CM | POA: Diagnosis not present

## 2022-09-13 DIAGNOSIS — I63549 Cerebral infarction due to unspecified occlusion or stenosis of unspecified cerebellar artery: Secondary | ICD-10-CM | POA: Diagnosis not present

## 2022-09-13 MED ORDER — CLOBETASOL PROPIONATE 0.05 % EX CREA
TOPICAL_CREAM | Freq: Two times a day (BID) | CUTANEOUS | Status: DC
  Filled 2022-09-13 (×2): qty 15

## 2022-09-13 NOTE — Evaluation (Signed)
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)   Past Medical History:  Past Medical History:  Diagnosis Date   Hypertension    Stroke [facility])    Past Surgical History:  Past Surgical History:  Procedure Laterality Date   ABDOMINAL HYSTERECTOMY     CHOLECYSTECTOMY     ENDOVASCULAR REPAIR/STENT GRAFT N/A 06/12/2020   Procedure: ENDOVASCULAR REPAIR/STENT GRAFT;  Surgeon: Susan Dills, MD;  Location: [facility] INVASIVE CV LAB;  Service: Cardiovascular;  Laterality: N/A;    Assessment & Plan Clinical Impression: Susan Farrell is a 77 year old right-handed female with history of hypertension, discoid lupus, hypothyroidism, AAA status post rupture and repair, tobacco abuse, 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 Per chart review patient lives with spouse. 1 level home 2-3 steps to entry. Independent driving prior to admission. Presented to [facility] 09/08/2022 with acute onset of dizziness, nausea and vomiting while eating dinner. MRI showed acute infarct in the right superior cerebellum and vermis. No evidence of hemorrhagic transformation. CTA head and neck no intracranial large vessel occlusion. 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 No hemodynamically significant stenosis in the neck. Admission chemistries unremarkable except glucose 138, hemoglobin 10.1, troponin negative, TSH 0.448, free T4 1.24, hemoglobin A1c 6.3.  Echocardiogram with ejection fraction of 60 to 65% no wall motion abnormalities grade 1 diastolic dysfunction. Neurology follow-up placed on aspirin 81 mg daily as well as Plavix 75 mg daily x 3 months then aspirin alone. Lovenox added for DVT prophylaxis. Tolerating a regular consistency diet. Therapy evaluations completed due to patient decreased functional mobility/dizziness was admitted for a comprehensive rehab program. Patient transferred to CIR on 09/12/2022 .    Patient currently requires min  to ModA with basic self-care skills secondary to muscle weakness and ataxia and decreased coordination.  Prior to hospitalization, patient could complete BADL related task  with .  Patient will benefit from skilled intervention to decrease level of assist with basic self-care skills and increase independence with basic self-care skills prior to discharge home with care partner.  Anticipate patient will require 24 hour supervision and  follow up to be determined at a later date .  OT - End of Session Activity Tolerance: Tolerates 30+ min activity with multiple rests Endurance Deficit: Yes Endurance Deficit Description: requires seated rest breaks and pt reports feeling fatigued OT Assessment Rehab Potential (ACUTE ONLY): Good OT Patient demonstrates impairments in the following area(s): Endurance;Balance;Other (Comment) (secondary medical dx) OT Basic ADL's Functional Problem(s): Dressing OT Additional Impairment(s): Other (comment);Fuctional Use of Upper Extremity (UB strength and functional transfers) OT Plan OT Frequency: Total of 15 hours over 7 days of combined therapies;5 out of 7 days OT Duration/Estimated Length of Stay: 12-14 days OT Treatment/Interventions: Disease mangement/prevention;Neuromuscular re-education;Self Care/advanced ADL retraining;Therapeutic Exercise;UE/LE Coordination activities;Wheelchair propulsion/positioning;Therapeutic Activities;UE/LE Strength taining/ROM OT Self  Feeding Anticipated Outcome(s): ModI OT Basic Self-Care Anticipated Outcome(s): ModI OT Toileting Anticipated Outcome(s): ModI OT Bathroom Transfers Anticipated Outcome(s): ModI OT Recommendation Recommendations for Other Services: Other (comment) (to be determined at  a later date) Patient destination: Home Follow Up Recommendations: Other (comment) (to be determined) Equipment Recommended: Rolling walker with 5" wheels   OT Evaluation Precautions/Restrictions  Precautions Precautions: Fall;Other (comment) Precaution Comments: 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 with pt's personal AFO in room Required Braces or Orthoses:  (LLE secondary to previous CVA) Restrictions Weight Bearing Restrictions: No General Chart Reviewed: Yes Family/Caregiver Present: Yes (Husband) Vital Signs Therapy Vitals Temp: 97.8 F (36.6 C) Temp Source: Oral Pulse Rate: 74 Resp: 16 BP: 123/70 Patient Position (if appropriate): Lying Oxygen Therapy SpO2: 100 % O2 Device: Room Air Patient Activity (if Appropriate): In bed Pain Pain Assessment Pain Scale: 0-10 Home Living/Prior Functioning Home Living Available Help at Discharge: Family, Available 24 hours/day Type of Home: House Home Access: Stairs to enter [employer] of Steps: 3-4 Entrance Stairs-Rails: None Home Layout: One level Bathroom Shower/Tub: Health visitor: Handicapped height Bathroom Accessibility: Yes Additional Comments: reports continued to have some L LE deficits from prior CVA requiring use of SPC for mobility and use of her L LE AFO  Lives With: Spouse IADL History Homemaking Responsibilities: Yes Meal Prep Responsibility: Secondary (husband prepares most meals, but she cooks as well) Current License: Yes (Indicated that she was still driving) Leisure and Hobbies: Enjoys going to church and bible study, enjoys shopping and vacationing Prior Function Level of Independence: Independent with gait,  Independent with transfers, Requires assistive device for independence (Used a single prong cane on occasion)  Able to Take Stairs?: Yes Driving: Yes Vocation: Retired Administrator, sports Baseline Vision/History: 0 No visual deficits Ability to See in Adequate Light: 1 Impaired Patient Visual Report: Blurring of vision (slight blurring of vision, mainly dizziness) Vision Assessment?: No apparent visual deficits Tracking/Visual Pursuits: Able to track stimulus in all quads without difficulty (converge as well) Perception  Perception: Within Functional Limits Praxis Praxis: WFL Cognition Cognition Overall Cognitive Status: Within Functional Limits for tasks assessed Arousal/Alertness: Awake/alert Memory: Appears intact Attention: Focused;Sustained;Selective Focused Attention: Appears intact Sustained Attention: Appears intact Selective Attention: Appears intact Awareness: Appears intact Problem Solving: Appears intact Safety/Judgment: Appears intact Brief Interview for Mental Status (BIMS) Repetition of Three Words (First Attempt): 3 Temporal Orientation: Year: Missed by 1 year Temporal Orientation: Month: Accurate within 5 days Temporal Orientation: Day: Correct Recall: "Sock": Yes, no cue required Recall: "Blue": Yes, no cue required Recall: "Bed": Yes, no cue required BIMS Summary Score: 14 Sensation Sensation Light Touch: Appears Intact (with vision occluded) Hot/Cold: Not tested Proprioception: Impaired Detail Proprioception Impaired Details: Impaired LLE Stereognosis: Not tested Coordination Gross Motor Movements are Fluid and Coordinated: No (challenges with the fludity of her movement) Fine Motor Movements are Fluid and Coordinated: No (some tremor present impacting the fludity of her movement.) Coordination and Movement Description: impaired due to 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 and 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 Motor  Motor Motor: Abnormal tone;Hemiplegia;Ataxia Motor - Skilled  Clinical Observations: L LE paresis with hypotonia from prior CVA, truncal ataxia with R lateral lean  Trunk/Postural Assessment  Cervical Assessment Cervical Assessment: Within Functional Limits Thoracic Assessment Thoracic Assessment: Within Functional Limits Lumbar Assessment Lumbar Assessment: Within Functional Limits Postural Control Postural Control: Deficits on evaluation Trunk Control: fair secondary to weakness Righting Reactions: delayed and inadequate Postural Limitations: decreased  Balance Balance Balance Assessed: Yes Static Sitting Balance Static Sitting - Balance Support: Feet supported Static Sitting - Level of Assistance: 5: Stand by assistance;6: Modified independent (Device/Increase time) Dynamic Sitting Balance Dynamic Sitting - Balance Support: Feet supported Dynamic  Sitting - Level of Assistance: 5: Stand by assistance (using RW and sink area for additional balance) Sitting balance - Comments: dynamic sitting balance tasks via ADLs (MinA using bed rails on occassion) Static Standing Balance Static Standing - Balance Support: During functional activity;Bilateral upper extremity supported Static Standing - Level of Assistance: 4: Min assist Dynamic Standing Balance Dynamic Standing - Balance Support: During functional activity;Bilateral upper extremity supported (using the counter, arm rest, or R/W for additional balance.) Dynamic Standing - Level of Assistance: 3: Mod assist Extremity/Trunk Assessment RUE Assessment RUE Assessment: Within Functional Limits Passive Range of Motion (PROM) Comments: WFL Active Range of Motion (AROM) Comments: WFL General Strength Comments: 3/5MMT (with rest tremors  present) LUE Assessment LUE Assessment: Within Functional Limits Passive Range of Motion (PROM) Comments: WFL Active Range of Motion (AROM) Comments: WFL General Strength Comments: 3/5MMT  Care Tool Care Tool Self Care Eating   Eating Assist Level: Set up  assist    Oral Care  Oral care, brush teeth, clean dentures activity did not occur:  (brushed dentures with s/u assist, but had no adhesive for proper placement.) Oral Care Assist Level: Set up assist    Bathing   Body parts bathed by patient: Right arm;Left arm;Chest;Abdomen;Front perineal area;Buttocks;Face;Right upper leg;Left upper leg Body parts bathed by helper: Right lower leg;Left lower leg   Assist Level: Minimal Assistance - Patient > 75%    Upper Body Dressing(including orthotics)   What is the patient wearing?: Pull over shirt   Assist Level: Set up assist    Lower Body Dressing (excluding footwear)   What is the patient wearing?: Pants;Underwear/pull up Assist for lower body dressing: Minimal Assistance - Patient > 75%    Putting on/Taking off footwear   What is the patient wearing?: Shoes;Non-skid slipper socks Assist for footwear: Maximal Assistance - Patient 25 - 49%       Care Tool Toileting Toileting activity   Assist for toileting: Moderate Assistance - Patient 50 - 74%     Care Tool Bed Mobility Roll left and right activity   Roll left and right assist level: Independent with assistive device    Sit to lying activity   Sit to lying assist level: Contact Guard/Touching assist (using the bed rails for additional balance) Sit to lying assistive device comment: HOB elevation feature as pt has this at home  Lying to sitting on side of bed activity   Lying to sitting on side of bed assist level: the ability to move from lying on the back to sitting on the side of the bed with no back support.: Supervision/Verbal cueing Lying to sitting on side of bed assist device comment: the ability to move from lying on the back to sitting on the side of the bed with no back support.: HOB elevation feature as pt has this at home   Care Tool Transfers Sit to stand transfer   Sit to stand assist level: Minimal Assistance - Patient > 75% (RW)    Chair/bed transfer    Chair/bed transfer assist level: Moderate Assistance - Patient 50 - 74% (Min-ModA)     Toilet transfer   Assist Level: Minimal Assistance - Patient > 75% (Min-Mod A using grab bars.)     Care Tool Cognition  Expression of Ideas and Wants Expression of Ideas and Wants: 4. Without difficulty (complex and basic) - expresses complex messages without difficulty and with speech that is clear and easy to understand  Understanding Verbal and Non-Verbal Content  Memory/Recall Ability Memory/Recall Ability : Current season   Refer to Care Plan for Long Term Goals  SHORT TERM GOAL WEEK 1 OT Short Term Goal 1 (Week 1): The pt will complete all functional transfer with ModI  at 95% safe OT Short Term Goal 2 (Week 1): The pt will improve UB strength to 4/5 MMT at 95% safe OT Short Term Goal 3 (Week 1): The pt will donn and doff LB garments with ModI at 95% safe OT Short Term Goal 4 (Week 1): The pt will complete sit to stands at ModI at 95% safe OT Short Term Goal 5 (Week 1): The pt will incorporate relaxation breathing during functional task performance , to reduce the incidence of tremor, for favorable outcome after with initial cue 4 of 5 attempts.  Recommendations for other services: None    Skilled Therapeutic Intervention  Patient seen this day for skilled OT evaluation to determine her needs in relation to UB/LB functional for improvements in  her functional outcome and to reduce the burden of care for others.  The pt presents with a good disposition and a willingness to return home as independent as possible. The pt presents with a history of CVA with residual RLE deficients compounded by her most recent episode impacting how she approaches her environment.  The pt presented with challenges at the time of the evaluation impacting functional transfers, LB dressing, activity tolerance, sitting balance, and functional mobility.  The pt completed a BADL related task in bathing and dressing, she  was able to complete UB bathing and dressing with s/u  to MinA secondary to IV place for threading her arm in clothing items at [employer] [employer] [facility] The pt was ModA for LB dressing secondary to challenges with donning and doffing the clothing items over her  feet, hips and bottom. The pt was Min to [facility] for coming from sit to stand. The pt was able to incorporate the arm rest of the w/c and the RW for additional balance to improve compliance during transfers, however, her static and dynamic balance presented as challenged posing a risk for falls. The pt was ModA for donning her shorts and was instructed to use opposite arm, opposite leg for > AROM during task performance to improve her success. The pt presented with fair activity tolerance requiring frequent rest breaks  for task completion. The pt presents with UB strength of 3/ 3+ and would benefit from UB  strengthening to improve her outcome as well as her work over time capacity.  In my opinion, the pt would benefit from skilled OT  12-14 weeks in duration to maximize her rehab potential in relation to the above mentioned areas of function, as well as  to improve her Ind with functional transfer and functional mobility to reduce her incidence for falls.  I recommend 12-14days of skilled OT for a more favorable outcome with recommendation for additional therapies at the time of d/c to be determined at a later date.    ADL ADL Eating: Set up (based on observation of functional status during bathing task) Where Assessed-Eating: Other (comment) Grooming: Setup Where Assessed-Grooming: Sitting at sink (at w/c LOF) Upper Body Bathing: Setup Where Assessed-Upper Body Bathing: Sitting at sink Lower Body Bathing: Minimal assistance (CGA/ MinA for functional balance while bathing perineal and bottom.) Where Assessed-Lower Body Bathing: Standing at sink Upper Body Dressing: Setup Where Assessed-Upper Body Dressing: Sitting at sink Lower Body Dressing: Minimal  assistance;Moderate assistance (secondary to challenges with manipulaiton for threading her  feet into the pants and coming from sit to stand.) Where Assessed-Lower Body Dressing: Standing at sink;Sitting at sink Toileting: Minimal assistance (challenges with sit to stand and managing clothing items during task performance.) Where Assessed-Toileting: Other (Comment) (based on task performance during bathing at sink LOF and coming from bed LOF to the w/c executing a stand pivot transfer.) Toilet Transfer: Minimal assistance;Moderate assistance Toilet Transfer Method: Stand pivot Toilet Transfer Equipment: Other (comment) Tub/Shower Transfer: Minimal assistance (based on observation of the pt transferring from bed LOF to the w/c) Tub/Shower Transfer Method: Stand pivot;Ambulating Film/video editor: Minimal assistance (with grab bars secondary to observation of functional transfers from one surface to another.) [employer] Method: Ambulating;Stand pivot Mobility  Bed Mobility Bed Mobility: Supine to Sit;Sit to Supine Supine to Sit: Supervision/Verbal cueing Sit to Supine: Supervision/Verbal cueing Transfers Sit to Stand: Minimal Assistance - Patient > 75%;Moderate Assistance - Patient 50-74% Stand to Sit: Minimal Assistance - Patient > 75%;Moderate Assistance - Patient 50-74%   Discharge Criteria: Patient will be discharged from OT if patient refuses treatment 3 consecutive times without medical reason, if treatment goals not met, if there is a change in medical status, if patient makes no progress towards goals or if patient is discharged from hospital.  The above assessment, treatment plan, treatment alternatives and goals were discussed and mutually agreed upon: by patient  Lavona Mound 09/13/2022, 3:02 PM

## 2022-09-13 NOTE — Plan of Care (Signed)
  Problem: Consults Goal: RH STROKE PATIENT EDUCATION Description: See Patient Education module for education specifics  Outcome: Progressing   Problem: RH BOWEL ELIMINATION Goal: RH STG MANAGE BOWEL WITH ASSISTANCE Description: STG Manage Bowel with toileting Assistance. Outcome: Progressing Goal: RH STG MANAGE BOWEL W/MEDICATION W/ASSISTANCE Description: STG Manage Bowel with Medication with mod I Assistance. Outcome: Progressing   Problem: RH SAFETY Goal: RH STG ADHERE TO SAFETY PRECAUTIONS W/ASSISTANCE/DEVICE Description: STG Adhere to Safety Precautions With cues Assistance/Device. Outcome: Progressing   Problem: RH PAIN MANAGEMENT Goal: RH STG PAIN MANAGED AT OR BELOW PT'S PAIN GOAL Description: < 4 with prns Outcome: Progressing   Problem: RH KNOWLEDGE DEFICIT Goal: RH STG INCREASE KNOWLEDGE OF DIABETES Description: Patient and spouse will be able to manage prediabetes with dietary modification using educational resources independently Outcome: Progressing Goal: RH STG INCREASE KNOWLEDGE OF HYPERTENSION Description: Patient and spouse will be able to manage HTN with medications and dietary modification using educational resources independently Outcome: Progressing Goal: RH STG INCREASE KNOWLEGDE OF HYPERLIPIDEMIA Description: Patient and spouse will be able to manage HLD with medications and dietary modification using educational resources independently Outcome: Progressing Goal: RH STG INCREASE KNOWLEDGE OF STROKE PROPHYLAXIS Description: Patient and spouse will be able to manage secondary risks with medications and dietary modification using educational resources independently Outcome: Progressing   Problem: Education: Goal: Knowledge of disease or condition will improve Outcome: Progressing Goal: Knowledge of secondary prevention will improve (MUST DOCUMENT ALL) Outcome: Progressing Goal: Knowledge of patient specific risk factors will improve Loraine Leriche N/A or DELETE  if not current risk factor) Outcome: Progressing   Problem: Ischemic Stroke/TIA Tissue Perfusion: Goal: Complications of ischemic stroke/TIA will be minimized Outcome: Progressing   Problem: Coping: Goal: Will verbalize positive feelings about self Outcome: Progressing Goal: Will identify appropriate support needs Outcome: Progressing   Problem: Health Behavior/Discharge Planning: Goal: Ability to manage health-related needs will improve Outcome: Progressing Goal: Goals will be collaboratively established with patient/family Outcome: Progressing   Problem: Self-Care: Goal: Ability to participate in self-care as condition permits will improve Outcome: Progressing Goal: Verbalization of feelings and concerns over difficulty with self-care will improve Outcome: Progressing Goal: Ability to communicate needs accurately will improve Outcome: Progressing   Problem: Nutrition: Goal: Risk of aspiration will decrease Outcome: Progressing Goal: Dietary intake will improve Outcome: Progressing

## 2022-09-13 NOTE — Progress Notes (Signed)
PROGRESS NOTE   Subjective/Complaints: Pt sitting EOB working on breakfast. Had a good night. No complaints. Ready for therapy today.  ROS: Patient denies fever, rash, sore throat, blurred vision, dizziness, nausea, vomiting, diarrhea, cough, shortness of breath or chest pain, joint or back/neck pain, headache, or mood change.    Objective:   No results found. No results for input(s): "WBC", "HGB", "HCT", "PLT" in the last 72 hours. No results for input(s): "NA", "K", "CL", "CO2", "GLUCOSE", "BUN", "CREATININE", "CALCIUM" in the last 72 hours.  Intake/Output Summary (Last 24 hours) at 09/13/2022 0835 Last data filed at 09/13/2022 0720 Gross per 24 hour  Intake 836 ml  Output --  Net 836 ml        Physical Exam: Vital Signs Blood pressure 136/73, pulse 80, temperature 97.6 F (36.4 C), temperature source Oral, resp. rate 18, height 5\' 6"  (1.676 m), weight 45.2 kg, SpO2 99%.  General: Alert and oriented x 3, No apparent distress HEENT: Head is normocephalic, atraumatic, PERRLA, EOMI, sclera anicteric, oral mucosa pink and moist, dentition intact, ext ear canals clear,  Neck: Supple without JVD or lymphadenopathy Heart: Reg rate and rhythm. No murmurs rubs or gallops Chest: CTA bilaterally without wheezes, rales, or rhonchi; no distress Abdomen: Soft, non-tender, non-distended, bowel sounds positive. Extremities: No clubbing, cyanosis, or edema. Pulses are 2+ Psych: Pt's affect is appropriate. Pt is cooperative Skin: multiple erythematous plaques on chest, arms, legs Neuro:  Alert and oriented x 3. Normal insight and awareness. Intact Memory. Sl word finding deficits but easily able to convey thoughts, speech was clear. Cranial nerve exam unremarkable. MMT: RUE and RLE 5/5. LUE 4+/5, LLE 2- to 3/5 prox to distal.   Musculoskeletal: Full ROM, No pain with AROM or PROM in the neck, trunk, or extremities. Posture appropriate      Assessment/Plan: 1. Functional deficits which require 3+ hours per day of interdisciplinary therapy in a comprehensive inpatient rehab setting. Physiatrist is providing close team supervision and 24 hour management of active medical problems listed below. Physiatrist and rehab team continue to assess barriers to discharge/monitor patient progress toward functional and medical goals  Care Tool:  Bathing              Bathing assist       Upper Body Dressing/Undressing Upper body dressing        Upper body assist      Lower Body Dressing/Undressing Lower body dressing            Lower body assist       Toileting Toileting    Toileting assist       Transfers Chair/bed transfer  Transfers assist           Locomotion Ambulation   Ambulation assist              Walk 10 feet activity   Assist           Walk 50 feet activity   Assist           Walk 150 feet activity   Assist           Walk 10 feet on uneven surface  activity  Assist           Wheelchair     Assist               Wheelchair 50 feet with 2 turns activity    Assist            Wheelchair 150 feet activity     Assist          Blood pressure 136/73, pulse 80, temperature 97.6 F (36.4 C), temperature source Oral, resp. rate 18, height 5\' 6"  (1.676 m), weight 45.2 kg, SpO2 99%.  Medical Problem List and Plan: 1. 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  Antithrombotics: -DVT/anticoagulation:  Pharmaceutical: Lovenox             -antiplatelet therapy: Aspirin 81 mg daily and Plavix 75 mg day x 90 days then  aspirin alone 3. Pain Management: Tylenol as needed 4. Mood/Behavior/Sleep: Provide emotional support             -antipsychotic agents: N/A 5. Neuropsych/cognition: This patient is capable of making decisions on her own behalf. 6. Skin/Wound Care: Routine skin checks 7. Fluids/Electrolytes/Nutrition: Routine in and outs with follow-up chemistries 8.  Permissive hypertension.    Patient on Norvasc 2.5 mg daily, Toprol-XL 50 mg daily prior to admission and has been resumed    8/24 bp controlled 9.  Hypothyroidism.  Synthroid 10.  Hyperlipidemia.  Crestor 11.  Tobacco use.  Chantix.  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  Patient use topical medications only if needed prior to admission.  Husband to bring in from home.   -otherwise keep open plaques clean  LOS: 1 days A FACE TO FACE EVALUATION WAS PERFORMED  Ranelle Oyster 09/13/2022, 8:35 AM

## 2022-09-13 NOTE — Plan of Care (Signed)
  Problem: RH Balance Goal: LTG Patient will maintain dynamic sitting balance (PT) Description: LTG:  Patient will maintain dynamic sitting balance with assistance during mobility activities (PT) Flowsheets (Taken 09/13/2022 1231) LTG: Pt will maintain dynamic sitting balance during mobility activities with:: Independent with assistive device  Goal: LTG Patient will maintain dynamic standing balance (PT) Description: LTG:  Patient will maintain dynamic standing balance with assistance during mobility activities (PT) Flowsheets (Taken 09/13/2022 1231) LTG: Pt will maintain dynamic standing balance during mobility activities with:: Contact Guard/Touching assist   Problem: Sit to Stand Goal: LTG:  Patient will perform sit to stand with assistance level (PT) Description: LTG:  Patient will perform sit to stand with assistance level (PT) Flowsheets (Taken 09/13/2022 1231) LTG: PT will perform sit to stand in preparation for functional mobility with assistance level: Supervision/Verbal cueing   Problem: RH Bed Mobility Goal: LTG Patient will perform bed mobility with assist (PT) Description: LTG: Patient will perform bed mobility with assistance, with/without cues (PT). Flowsheets (Taken 09/13/2022 1231) LTG: Pt will perform bed mobility with assistance level of: Independent with assistive device    Problem: RH Bed to Chair Transfers Goal: LTG Patient will perform bed/chair transfers w/assist (PT) Description: LTG: Patient will perform bed to chair transfers with assistance (PT). Flowsheets (Taken 09/13/2022 1231) LTG: Pt will perform Bed to Chair Transfers with assistance level: Supervision/Verbal cueing   Problem: RH Car Transfers Goal: LTG Patient will perform car transfers with assist (PT) Description: LTG: Patient will perform car transfers with assistance (PT). Flowsheets (Taken 09/13/2022 1231) LTG: Pt will perform car transfers with assist:: Supervision/Verbal cueing   Problem: RH  Ambulation Goal: LTG Patient will ambulate in controlled environment (PT) Description: LTG: Patient will ambulate in a controlled environment, # of feet with assistance (PT). Flowsheets (Taken 09/13/2022 1231) LTG: Pt will ambulate in controlled environ  assist needed:: Contact Guard/Touching assist LTG: Ambulation distance in controlled environment: 169ft using LRAD Goal: LTG Patient will ambulate in home environment (PT) Description: LTG: Patient will ambulate in home environment, # of feet with assistance (PT). Flowsheets (Taken 09/13/2022 1231) LTG: Pt will ambulate in home environ  assist needed:: Contact Guard/Touching assist LTG: Ambulation distance in home environment: 67ft using LRAD   Problem: RH Stairs Goal: LTG Patient will ambulate up and down stairs w/assist (PT) Description: LTG: Patient will ambulate up and down # of stairs with assistance (PT) Flowsheets (Taken 09/13/2022 1231) LTG: Pt will ambulate up/down stairs assist needed:: Contact Guard/Touching assist LTG: Pt will  ambulate up and down number of stairs: 4 steps using HRs per home set-up

## 2022-09-13 NOTE — Progress Notes (Signed)
Occupational Therapy Session Note  Patient Details  Name: Susan Farrell MRN: [id number] Date of Birth: 1945-10-04  Today's Date: 09/13/2022 OT Individual Time: 1914-7829 OT Individual Time Calculation (min): 60 min    Short Term Goals: Week 1:  OT Short Term Goal 1 (Week 1): The pt will complete all functional transfer with ModI  at 95% safe OT Short Term Goal 2 (Week 1): The pt will improve UB strength to 4/5 MMT at 95% safe OT Short Term Goal 3 (Week 1): The pt will donn and doff LB garments with ModI at 95% safe OT Short Term Goal 4 (Week 1): The pt will complete sit to stands at ModI at 95% safe OT Short Term Goal 5 (Week 1): The pt will incorporate relaxation breathing during functional task performance , to reduce the incidence of tremor, for favorable outcome after with initial cue 4 of 5 attempts.  Skilled Therapeutic Interventions/Progress Updates:  Disease mangement/prevention;Neuromuscular re-education;Self Care/advanced ADL retraining;Therapeutic Exercise;UE/LE Coordination activities;Wheelchair propulsion/positioning;Therapeutic Activities;UE/LE Strength taining/ROM   Therapy Documentation  Patient in bed resting at the time of arrival, pt in agreement with completing UB exercises to improve functional outcome. Patient transfer from bed LOF to w/c with MinA. Patient able to roll w/c from her room to the gym for > 54ft with close S. The pt completed UB exercises using a 2lb dowel for shld flexion, horizontal abduction, shld rotation and large circles 2 sets of 10 with rest breaks as needed, the pt required 1 rest break with each exercise.     The pt went on to complete a simulated task in LB dressing using medium grade theraband 3x with rest breaks as needed, the pt required 2 rest breaks. The pt was able to thread her feet through the opening  of the theraband and donn the theraband over her hips and buttocks by coming from sit to stand with MinA using the RW for additional  balance.  The pt was transported back to her room and was able to transfer from the w/c to bed LOF with Min A for coming from sit to stand and ModA for transferring to the bed using the bed rail and the arm of the w/c for additional balance. At the end of the session, the call light and bedside table were both placed with in reach and all additional needs were addressed.  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 with pt's personal AFO in room Required Braces or Orthoses:  (LLE secondary to previous CVA) Restrictions Weight Bearing Restrictions: No General: General Chart Reviewed: Yes Family/Caregiver Present: Yes (Husband) Vital Signs: Therapy Vitals Temp: 97.8 F (36.6 C) Temp Source: Oral Pulse Rate: 74 Resp: 16 BP: 123/70 Patient Position (if appropriate): Lying Oxygen Therapy SpO2: 100 % O2 Device: Room Air Patient Activity (if Appropriate): In bed Pain: Pain Assessment Pain Scale: 0-10 ADL: ADL Eating: Set up (based on observation of functional status during bathing task) Where Assessed-Eating: Other (comment) Grooming: Setup Where Assessed-Grooming: Sitting at sink (at w/c LOF) Upper Body Bathing: Setup Where Assessed-Upper Body Bathing: Sitting at sink Lower Body Bathing: Minimal assistance (CGA/ MinA for functional balance while bathing perineal and bottom.) Where Assessed-Lower Body Bathing: Standing at sink Upper Body Dressing: Setup Where Assessed-Upper Body Dressing: Sitting at sink Lower Body Dressing: Minimal assistance, Moderate assistance (secondary to challenges with manipulaiton for threading her feet into the pants and coming from sit to stand.) Where Assessed-Lower Body Dressing: Standing at  sink, Sitting at sink Toileting: Minimal assistance (challenges with sit to stand and managing clothing items during task performance.) Where Assessed-Toileting: Other (Comment) (based on task  performance during bathing at sink LOF and coming from bed LOF to the w/c executing a stand pivot transfer.) Toilet Transfer: Minimal assistance, Moderate assistance Toilet Transfer Method: Stand pivot Toilet Transfer Equipment: Other (comment) Tub/Shower Transfer: Minimal assistance (based on observation of the pt transferring from bed LOF to the w/c) Tub/Shower Transfer Method: Stand pivot, Ambulating Psychologist, counselling Transfer: Minimal assistance (with grab bars secondary to observation of functional transfers from one surface to another.) Film/video editor Method: Ambulating, Stand pivot Vision Baseline Vision/History: 0 No visual deficits Patient Visual Report: Blurring of vision (slight blurring of vision, mainly dizziness) Tracking/Visual Pursuits: Able to track stimulus in all quads without difficulty (converge as well) Perception  Perception: Within Functional Limits Praxis Praxis: [facility] Balance Balance Balance Assessed: Yes Static Sitting Balance Static Sitting - Balance Support: Feet supported Static Sitting - Level of Assistance: 5: Stand by assistance;6: Modified independent (Device/Increase time) Dynamic Sitting Balance Dynamic Sitting - Balance Support: Feet supported Dynamic Sitting - Level of Assistance: 5: Stand by assistance (using RW and sink area for additional balance) Sitting balance - Comments: dynamic sitting balance tasks via ADLs (MinA using bed rails on occassion) Static Standing Balance Static Standing - Balance Support: During functional activity;Bilateral upper extremity supported Static Standing - Level of Assistance: 4: Min assist Dynamic Standing Balance Dynamic Standing - Balance Support: During functional activity;Bilateral upper extremity supported (using the counter, arm rest, or R/W for additional balance.) Dynamic Standing - Level of Assistance: 3: Mod assist Exercises:   Other Treatments:     Therapy/Group: Individual Therapy  Lavona Mound 09/13/2022, 4:11 PM

## 2022-09-13 NOTE — Evaluation (Signed)
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 Per chart review patient lives with spouse. 1 level home 2-3 steps to entry. Independent driving prior to admission. [facility]ed to [facility]/08/2022 with acute onset of dizziness, nausea and vomiting while eating dinner. MRI showed acute infarct in the right superior cerebellum and vermis. No evidence of hemorrhagic transformation. CTA head and neck no intracranial large vessel occlusion. 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 No hemodynamically significant stenosis in the neck. Admission chemistries unremarkable except glucose 138, hemoglobin 10.1, troponin negative, TSH 0.448, free T4 1.24,  hemoglobin A1c 6.3. Echocardiogram with ejection fraction of 60 to 65% no wall motion abnormalities grade 1 diastolic dysfunction. Neurology follow-up placed on aspirin 81 mg daily as well as Plavix 75 mg daily x 3 months then aspirin alone. Lovenox added for DVT prophylaxis. Tolerating a regular consistency diet. Therapy evaluations completed due to patient decreased functional mobility/dizziness was admitted for a comprehensive rehab program. Patient transferred to CIR on 09/12/2022 .   Patient currently requires mod A with mobility secondary to muscle weakness and muscle paralysis, decreased cardiorespiratoy endurance, impaired timing and sequencing, abnormal tone, unbalanced muscle activation, ataxia, and decreased coordination, decreased midline orientation,  , and decreased standing balance, decreased postural control, hemiplegia, and decreased balance strategies.  Prior to hospitalization, patient was modified independent  with mobility and lived with Spouse in a House home.  Home access is 3-4Stairs to enter.  Patient will benefit from skilled PT intervention to maximize safe functional mobility, minimize fall risk, and decrease caregiver burden for planned discharge home with 24 hour supervision.  Anticipate patient will benefit from follow up OP at discharge.  PT - End of Session Activity Tolerance: Tolerates 30+ min activity with multiple rests Endurance Deficit: Yes Endurance Deficit Description: requires seated rest breaks and pt reports feeling fatigued PT Assessment Rehab Potential (ACUTE/IP ONLY): Good PT Barriers to Discharge: Inaccessible home environment;Home environment access/layout PT Barriers to Discharge Comments: needs HRs on stairs PT Patient demonstrates impairments in the following area(s): Balance;Safety;Sensory;Edema;Endurance;Skin Integrity;Motor;Nutrition PT Transfers Functional Problem(s): Bed Mobility;Bed to Chair;Car;Furniture PT Locomotion Functional Problem(s):  Ambulation;Stairs PT Plan PT Intensity: Minimum of 1-2 x/day ,45 to 90 minutes PT Frequency: 5 out of 7 days PT Duration Estimated Length of Stay: 2-2.5 weeks PT Treatment/Interventions: Ambulation/gait training;Community reintegration;DME/adaptive equipment instruction;Neuromuscular re-education;Psychosocial support;Stair training;UE/LE Strength  taining/ROM;Balance/vestibular training;Discharge planning;Functional electrical stimulation;Pain management;Therapeutic Activities;Skin care/wound management;UE/LE Coordination activities;Cognitive remediation/compensation;Disease management/prevention;Functional mobility training;Patient/family education;Splinting/orthotics;Therapeutic Exercise;Visual/perceptual remediation/compensation PT Transfers Anticipated Outcome(s): supervision using LRAD PT Locomotion Anticipated Outcome(s): CGA using LRAD PT Recommendation Recommendations for Other Services: Therapeutic Recreation consult Therapeutic Recreation Interventions: Outing/community reintergration;Stress management Follow Up Recommendations: Outpatient PT;24 hour supervision/assistance Patient destination: Home Equipment Recommended: To be determined   PT Evaluation Precautions/Restrictions Precautions Precautions: Fall;Other (comment) Precaution Comments: 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 with pt's personal AFO in room Required Braces or Orthoses:  (LLE secondary to previous CVA) Restrictions Weight Bearing Restrictions: No Pain Pain Assessment Pain Scale: 0-10 Pain Score: 0-No pain Pain Interference Pain Interference Pain Effect on Sleep: 0. Does not apply - I have not had any pain or hurting in the past 5 days Pain Interference with Therapy Activities: 1. Rarely or not at all Pain Interference with Day-to-Day Activities: 1. Rarely or not at all Home Living/Prior Functioning Home Living Available Help at Discharge: Family;Available 24 hours/day Type of Home: House Home Access: Stairs to  enter [employer] of Steps: 3-4 Entrance Stairs-Rails: None Home Layout: One level Bathroom Shower/Tub: Health visitor: Handicapped height Bathroom Accessibility: Yes Additional Comments: reports continued to have some L LE deficits from prior CVA requiring use of SPC for mobility and use of her L LE AFO  Lives With: Spouse Prior Function Level of Independence: Independent with gait;Independent with transfers;Requires assistive device for independence (Used a single prong cane on occasion)  Able to Take Stairs?: Yes Driving: Yes Vocation: Retired Vision/Perception  Vision - History Ability to See in Adequate Light: 1 Impaired Perception Perception: Within Functional Limits Praxis Praxis: WFL  Cognition Overall Cognitive Status: Within Functional Limits for tasks assessed Arousal/Alertness: Awake/alert Orientation Level: Oriented X4 Year: 2024 Month: August Day of Week: Correct Attention: Focused;Sustained;Selective Focused Attention: Appears intact Sustained Attention: Appears intact Selective Attention: Appears intact Memory: Appears intact Awareness: Appears intact Safety/Judgment: Appears intact Sensation Sensation Light Touch: Appears Intact Hot/Cold: Not tested Proprioception: Impaired Detail Proprioception Impaired Details: Impaired LLE Stereognosis: Not tested Coordination Gross Motor Movements are Fluid and Coordinated: No Coordination and Movement Description: impaired due to 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 and 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 Motor  Motor Motor: Abnormal tone;Hemiplegia;Ataxia Motor - Skilled Clinical Observations: L LE paresis with hypotonia from prior CVA, truncal ataxia with R lateral lean   Trunk/Postural Assessment  Cervical Assessment Cervical Assessment: Within Functional Limits Thoracic Assessment Thoracic Assessment: Within Functional Limits Lumbar Assessment Lumbar Assessment: Within Functional  Limits Postural Control Postural Control: Deficits on evaluation Righting Reactions: delayed and inadequate Postural Limitations: decreased  Balance Balance Balance Assessed: Yes Static Sitting Balance Static Sitting - Balance Support: Feet supported Static Sitting - Level of Assistance: 5: Stand by assistance;6: Modified independent (Device/Increase time) Dynamic Sitting Balance Dynamic Sitting - Balance Support: Feet supported Dynamic Sitting - Level of Assistance: 5: Stand by assistance Static Standing Balance Static Standing - Balance Support: During functional activity;Bilateral upper extremity supported Static Standing - Level of Assistance: 4: Min assist Dynamic Standing Balance Dynamic Standing - Balance Support: During functional activity;Bilateral upper extremity supported Dynamic Standing - Level of Assistance: 3: Mod assist Extremity Assessment      RLE Assessment RLE Assessment: Exceptions to [facility] Active Range of Motion (AROM) Comments: WFL/WNL General Strength Comments: assessed in sitting RLE Strength Right Hip Flexion: 4+/5 Right Knee Flexion: 4+/5 Right Knee Extension: 4+/5 Right Ankle Dorsiflexion: 4+/5 Right Ankle Plantar Flexion: 4+/5 LLE Assessment LLE Assessment: Exceptions to  WFL Passive Range of Motion (PROM) Comments: WFL/WNL General Strength Comments: assessed in sitting; pt wears L LE AFO since prior CVA; pt reports this is her baseline strength since prior CVA LLE Strength Left Hip Flexion: 2-/5 Left Knee Flexion: 2+/5 Left Knee Extension: 3+/5 Left Ankle Dorsiflexion: 1/5 Left Ankle Plantar Flexion: 1/5 LLE Tone LLE Tone: Mild;Hypotonic LLE Tone Comments: from prior CVA   Care Tool Care Tool Bed Mobility Roll left and right activity   Roll left and right assist level: Independent with assistive device    Sit to lying activity   Sit to lying assist level: Supervision/Verbal cueing Sit to lying assistive device comment: HOB elevation  feature as pt has this at home  Lying to sitting on side of bed activity   Lying to sitting on side of bed assist level: the ability to move from lying on the back to sitting on the side of the bed with no back support.: Supervision/Verbal cueing Lying to sitting on side of bed assist device comment: the ability to move from lying on the back to sitting on the side of the bed with no back support.: HOB elevation feature as pt has this at home   Care Tool Transfers Sit to stand transfer   Sit to stand assist level: Minimal Assistance - Patient > 75%    Chair/bed transfer   Chair/bed transfer assist level: Moderate Assistance - Patient 50 - 74%     Psychologist, clinical transfer assist level: Moderate Assistance - Patient 50 - 74%      Care Tool Locomotion Ambulation Ambulation activity did not occur: Safety/medical concerns (requires use of RW for safe ambulation, pt used SPC at baseline) Assist level: Moderate Assistance - Patient 50 - 74% Assistive device: Walker-rolling Max distance: 54ft  Walk 10 feet activity   Assist level: Moderate Assistance - Patient - 50 - 74% Assistive device: Walker-rolling   Walk 50 feet with 2 turns activity Walk 50 feet with 2 turns activity did not occur: Safety/medical concerns      Walk 150 feet activity Walk 150 feet activity did not occur: Safety/medical concerns      Walk 10 feet on uneven surfaces activity Walk 10 feet on uneven surfaces activity did not occur: Safety/medical concerns      Stairs Stair activity did not occur: Safety/medical concerns (requires use of HRs which pt does not have at home) Assist level: Minimal Assistance - Patient > 75% Stairs assistive device: 2 hand rails Max number of stairs: 8  Walk up/down 1 step activity   Walk up/down 1 step (curb) assist level: Minimal Assistance - Patient > 75% Walk up/down 1 step or curb assistive device: 2 hand rails  Walk up/down 4 steps activity   Walk  up/down 4 steps assist level: Minimal Assistance - Patient > 75% Walk up/down 4 steps assistive device: 2 hand rails  Walk up/down 12 steps activity Walk up/down 12 steps activity did not occur: Safety/medical concerns      Pick up small objects from floor Pick up small object from the floor (from standing position) activity did not occur:  (requires use of RW) Pick up small object from the floor assist level: Moderate Assistance - Patient 50 - 74% Pick up small object from the floor assistive device: using RW  Wheelchair Is the patient using a wheelchair?: Yes (for transport) Type of Wheelchair: Manual   Wheelchair assist level: Dependent -  Patient 0%    Wheel 50 feet with 2 turns activity   Assist Level: Dependent - Patient 0%  Wheel 150 feet activity   Assist Level: Dependent - Patient 0%    Refer to Care Plan for Long Term Goals  SHORT TERM GOAL WEEK 1 PT Short Term Goal 1 (Week 1): Pt will participate in standardized outcome measure to assess balance and falls risk PT Short Term Goal 2 (Week 1): Pt will perform sit<>stands using LRAD with min A PT Short Term Goal 3 (Week 1): Pt will perform bed<>chair transfers using LRAD with min A PT Short Term Goal 4 (Week 1): Pt will ambulate at least 186ft using LRAD with min A PT Short Term Goal 5 (Week 1): Pt will navigate 4 stairs using HRs per home set-up with min A  Recommendations for other services: Therapeutic Recreation  Stress management and Outing/community reintegration  Skilled Therapeutic Intervention Pt received supported up in bed awake and agreeable to therapy session. Evaluation completed (see details above) with patient education regarding purpose of PT evaluation, PT POC and goals, therapy schedule, weekly team meetings, and other CIR information including safety plan and fall risk safety. Pt performed the below functional mobility tasks with the specified levels of skilled cuing and assistance.   Donned pt's personal L  LE [place] Walk-on PLS AFO prior to standing/ambulating tasks. Therapist educated pt on need for a new pair of shoes and educated her on getting a 10 or 10.5 to allow room for brace (wears 10 at baseline) and need for extra wide and deep shoes to accommodate her swelling.  During gait training therapist having to stabilize RW due to truncal ataxia while facilitating continuous forward movement of AD - pt reports she feels a "pull" towards the R when standing/ambulating. Pt with decreased gait speed, poor LLE foot clearance due to 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, overall slight flexed posture in both knees throughout gait, and increased postural instability.  At end of session, pt left supported upright in bed with needs in reach and bed alarm on.  Mobility Bed Mobility Bed Mobility: Supine to Sit;Sit to Supine (with HOB elevated as pt has this feature at home; and pt using UEs to assist with L LE management) Supine to Sit: Supervision/Verbal cueing Sit to Supine: Supervision/Verbal cueing Transfers Transfers: Sit to Stand;Stand to Sit;Stand Pivot Transfers Sit to Stand: Minimal Assistance - Patient > 75%;Moderate Assistance - Patient 50-74% Stand to Sit: Minimal Assistance - Patient > 75%;Moderate Assistance - Patient 50-74% Stand Pivot Transfers: Moderate Assistance - Patient 50 - 74% Stand Pivot Transfer Details: Verbal cues for technique;Verbal cues for sequencing;Verbal cues for gait pattern;Verbal cues for safe use of DME/AE;Tactile cues for weight shifting;Tactile cues for sequencing;Verbal cues for precautions/safety;Visual cues/gestures for sequencing Transfer (Assistive device): None (with RW progresses to min A) Locomotion  Gait Ambulation: Yes Gait Assistance: Minimal Assistance - Patient > 75%;Moderate Assistance - Patient 50-74% Gait Distance (Feet): 45 Feet (x2) Assistive device: Rolling walker;Other (Comment) (wearing pt's personal L LE AFO) Gait Assistance Details: Verbal cues  for technique;Verbal cues for precautions/safety;Manual facilitation for weight shifting;Verbal cues for safe use of DME/AE;Verbal cues for gait pattern;Verbal cues for sequencing;Other (comment) Gait Assistance Details: manual facilitation for stabilizing RW due to ataxia and to provide continuous forward movement of AD; pt reports feeling strong R lateral pull when standing/ambulating Gait Gait: Yes Gait Pattern: Impaired Gait Pattern: Poor foot clearance - left;Ataxic;Decreased dorsiflexion - left;Decreased hip/knee flexion - left;Left flexed knee  in stance;Right flexed knee in stance;Decreased stride length;Decreased step length - left;Decreased step length - right;Step-through pattern Gait velocity: decreased Stairs / Additional Locomotion Stairs: Yes Stairs Assistance: Minimal Assistance - Patient > 75% Stair Management Technique: Two rails;Step to pattern;Forwards (step-to leading with R LE on ascent and L LE on descent) Number of Stairs: 8 Height of Stairs: 6 Wheelchair Mobility Wheelchair Mobility: No   Discharge Criteria: Patient will be discharged from PT if patient refuses treatment 3 consecutive times without medical reason, if treatment goals not met, if there is a change in medical status, if patient makes no progress towards goals or if patient is discharged from hospital.  The above assessment, treatment plan, treatment alternatives and goals were discussed and mutually agreed upon: by patient  Ginny Forth , PT, DPT, NCS, CSRS 09/13/2022, 12:29 PM

## 2022-09-14 DIAGNOSIS — L930 Discoid lupus erythematosus: Secondary | ICD-10-CM | POA: Diagnosis not present

## 2022-09-14 DIAGNOSIS — I63549 Cerebral infarction due to unspecified occlusion or stenosis of unspecified cerebellar artery: Secondary | ICD-10-CM | POA: Diagnosis not present

## 2022-09-14 DIAGNOSIS — I10 Essential (primary) hypertension: Secondary | ICD-10-CM | POA: Diagnosis not present

## 2022-09-14 NOTE — Plan of Care (Signed)
  Problem: RH BOWEL ELIMINATION Goal: RH STG MANAGE BOWEL WITH ASSISTANCE Description: STG Manage Bowel with toileting Assistance. Outcome: Progressing Goal: RH STG MANAGE BOWEL W/MEDICATION W/ASSISTANCE Description: STG Manage Bowel with Medication with mod I Assistance. Outcome: Progressing   Problem: RH SAFETY Goal: RH STG ADHERE TO SAFETY PRECAUTIONS W/ASSISTANCE/DEVICE Description: STG Adhere to Safety Precautions With cues Assistance/Device. Outcome: Progressing   Problem: RH PAIN MANAGEMENT Goal: RH STG PAIN MANAGED AT OR BELOW PT'S PAIN GOAL Description: < 4 with prns Outcome: Progressing   Problem: RH KNOWLEDGE DEFICIT Goal: RH STG INCREASE KNOWLEDGE OF DIABETES Description: Patient and spouse will be able to manage prediabetes with dietary modification using educational resources independently Outcome: Progressing Goal: RH STG INCREASE KNOWLEDGE OF HYPERTENSION Description: Patient and spouse will be able to manage HTN with medications and dietary modification using educational resources independently Outcome: Progressing Goal: RH STG INCREASE KNOWLEGDE OF HYPERLIPIDEMIA Description: Patient and spouse will be able to manage HLD with medications and dietary modification using educational resources independently Outcome: Progressing Goal: RH STG INCREASE KNOWLEDGE OF STROKE PROPHYLAXIS Description: Patient and spouse will be able to manage secondary risks with medications and dietary modification using educational resources independently Outcome: Progressing

## 2022-09-14 NOTE — Progress Notes (Signed)
PROGRESS NOTE   Subjective/Complaints: Pt reports a good day of therapy yesterday. No complaints this morning. Slept well  ROS: Patient denies fever, rash, sore throat, blurred vision, dizziness, nausea, vomiting, diarrhea, cough, shortness of breath or chest pain, joint or back/neck pain, headache, or mood change.    Objective:   No results found. No results for input(s): "WBC", "HGB", "HCT", "PLT" in the last 72 hours. No results for input(s): "NA", "K", "CL", "CO2", "GLUCOSE", "BUN", "CREATININE", "CALCIUM" in the last 72 hours.  Intake/Output Summary (Last 24 hours) at 09/14/2022 0751 Last data filed at 09/14/2022 0747 Gross per 24 hour  Intake 820 ml  Output --  Net 820 ml        Physical Exam: Vital Signs Blood pressure 129/75, pulse 64, temperature 99 F (37.2 C), temperature source Oral, resp. rate 18, height 5\' 6"  (1.676 m), weight 45.2 kg, SpO2 100%.  Constitutional: No distress . Vital signs reviewed. HEENT: NCAT, EOMI, oral membranes moist Neck: supple Cardiovascular: RRR without murmur. No JVD    Respiratory/Chest: CTA Bilaterally without wheezes or rales. Normal effort    GI/Abdomen: BS +, non-tender, non-distended Ext: no clubbing, cyanosis, or edema Psych: pleasant and cooperative  Skin: multiple erythematous plaques on chest, arms, legs Neuro:  Alert and oriented x 3. Normal insight and awareness. Intact Memory. Sl word finding deficits but easily able to communicate, speech was clear. Cranial nerve exam unremarkable. MMT: RUE and RLE 5/5. LUE 4+/5, LLE 2- to 3/5 prox to distal.   Musculoskeletal: Full ROM, No pain with AROM or PROM in the neck, trunk, or extremities. Posture appropriate     Assessment/Plan: 1. Functional deficits which require 3+ hours per day of interdisciplinary therapy in a comprehensive inpatient rehab setting. Physiatrist is providing close team supervision and 24 hour  management of active medical problems listed below. Physiatrist and rehab team continue to assess barriers to discharge/monitor patient progress toward functional and medical goals  Care Tool:  Bathing    Body parts bathed by patient: Right arm, Left arm, Chest, Abdomen, Front perineal area, Buttocks, Face, Right upper leg, Left upper leg   Body parts bathed by helper: Right lower leg, Left lower leg     Bathing assist Assist Level: Minimal Assistance - Patient > 75%     Upper Body Dressing/Undressing Upper body dressing   What is the patient wearing?: Pull over shirt    Upper body assist Assist Level: Set up assist    Lower Body Dressing/Undressing Lower body dressing      What is the patient wearing?: Pants, Underwear/pull up     Lower body assist Assist for lower body dressing: Minimal Assistance - Patient > 75%     Toileting Toileting    Toileting assist Assist for toileting: Moderate Assistance - Patient 50 - 74%     Transfers Chair/bed transfer  Transfers assist     Chair/bed transfer assist level: Moderate Assistance - Patient 50 - 74% (Min-ModA)     Locomotion Ambulation   Ambulation assist   Ambulation activity did not occur: Safety/medical concerns (requires use of RW for safe ambulation, pt used SPC at baseline)  Assist level: Moderate Assistance -  Patient 50 - 74% Assistive device: Walker-rolling Max distance: 51ft   Walk 10 feet activity   Assist     Assist level: Moderate Assistance - Patient - 50 - 74% Assistive device: Walker-rolling   Walk 50 feet activity   Assist Walk 50 feet with 2 turns activity did not occur: Safety/medical concerns         Walk 150 feet activity   Assist Walk 150 feet activity did not occur: Safety/medical concerns         Walk 10 feet on uneven surface  activity   Assist Walk 10 feet on uneven surfaces activity did not occur: Safety/medical concerns          Wheelchair     Assist Is the patient using a wheelchair?: Yes Type of Wheelchair: Manual    Wheelchair assist level: Minimal Assistance - Patient > 75%, Moderate Assistance - Patient 50 - 74% Max wheelchair distance: 38ft with Min/ModA for maintaining safety    Wheelchair 50 feet with 2 turns activity    Assist        Assist Level: Dependent - Patient 0%   Wheelchair 150 feet activity     Assist      Assist Level: Dependent - Patient 0%   Blood pressure 129/75, pulse 64, temperature 99 F (37.2 C), temperature source Oral, resp. rate 18, height 5\' 6"  (1.676 m), weight 45.2 kg, SpO2 100%.  Medical Problem List and Plan: 1. 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  Antithrombotics: -DVT/anticoagulation:  Pharmaceutical: Lovenox             -antiplatelet therapy: Aspirin 81 mg daily and Plavix 75 mg day x 90 days then aspirin alone 3. Pain Management: Tylenol as needed 4. Mood/Behavior/Sleep: Provide emotional support             -antipsychotic agents: N/A 5. Neuropsych/cognition: This patient is capable of making decisions on her own behalf. 6. Skin/Wound Care: Routine skin checks 7. Fluids/Electrolytes/Nutrition: Routine in and outs with follow-up chemistries 8.  Permissive hypertension.    Patient on Norvasc 2.5 mg daily, Toprol-XL 50 mg daily prior to admission and has been resumed    8/25 bp controlled 9.  Hypothyroidism.  Synthroid 10.  Hyperlipidemia.  Crestor 11.  Tobacco use.  Chantix.  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  Patient use topical medications only if needed prior to admission.  Husband to bring in from home.   -otherwise keep  plaques clean  LOS: 2 days A FACE TO FACE EVALUATION WAS PERFORMED  Susan Farrell 09/14/2022, 7:51 AM

## 2022-09-15 DIAGNOSIS — I63549 Cerebral infarction due to unspecified occlusion or stenosis of unspecified cerebellar artery: Secondary | ICD-10-CM | POA: Diagnosis not present

## 2022-09-15 LAB — COMPREHENSIVE METABOLIC PANEL
ALT: 15 U/L (ref 0–44)
AST: 26 U/L (ref 15–41)
Albumin: 3 g/dL — ABNORMAL LOW (ref 3.5–5.0)
Alkaline Phosphatase: 59 U/L (ref 38–126)
Anion gap: 8 (ref 5–15)
BUN: 9 mg/dL (ref 8–23)
CO2: 24 mmol/L (ref 22–32)
Calcium: 9 mg/dL (ref 8.9–10.3)
Chloride: 105 mmol/L (ref 98–111)
Creatinine, Ser: 0.76 mg/dL (ref 0.44–1.00)
GFR, Estimated: >60 mL/min (ref >60)
Glucose, Bld: 118 mg/dL — ABNORMAL HIGH (ref 70–99)
Potassium: 4.3 mmol/L (ref 3.5–5.1)
Sodium: 137 mmol/L (ref 135–145)
Total Bilirubin: 0.5 mg/dL (ref 0.3–1.2)
Total Protein: 6.9 g/dL (ref 6.5–8.1)

## 2022-09-15 LAB — CBC WITH DIFFERENTIAL/PLATELET
Abs Immature Granulocytes: 0.01 10*3/uL (ref 0.00–0.07)
Basophils Absolute: 0 10*3/uL (ref 0.0–0.1)
Basophils Relative: 1 %
Eosinophils Absolute: 0.1 10*3/uL (ref 0.0–0.5)
Eosinophils Relative: 3 %
HCT: 31.5 % — ABNORMAL LOW (ref 36.0–46.0)
Hemoglobin: 9.9 g/dL — ABNORMAL LOW (ref 12.0–15.0)
Immature Granulocytes: 0 %
Lymphocytes Relative: 42 %
Lymphs Abs: 1.3 10*3/uL (ref 0.7–4.0)
MCH: 23.7 pg — ABNORMAL LOW (ref 26.0–34.0)
MCHC: 31.4 g/dL (ref 30.0–36.0)
MCV: 75.5 fL — ABNORMAL LOW (ref 80.0–100.0)
Monocytes Absolute: 0.3 10*3/uL (ref 0.1–1.0)
Monocytes Relative: 8 %
Neutro Abs: 1.4 10*3/uL — ABNORMAL LOW (ref 1.7–7.7)
Neutrophils Relative %: 46 %
Platelets: 197 10*3/uL (ref 150–400)
RBC: 4.17 MIL/uL (ref 3.87–5.11)
RDW: 17.3 % — ABNORMAL HIGH (ref 11.5–15.5)
WBC: 3.1 10*3/uL — ABNORMAL LOW (ref 4.0–10.5)
nRBC: 0 % (ref 0.0–0.2)

## 2022-09-15 LAB — VITAMIN D 25 HYDROXY (VIT D DEFICIENCY, FRACTURES): Vit D, 25-Hydroxy: 58.52 ng/mL (ref 30–100)

## 2022-09-15 NOTE — Progress Notes (Signed)
PROGRESS NOTE   Subjective/Complaints: No new complaints this morning Working with Amil Amen OT Labs reviewed and are stable Chart reviewed and no issues overnight  ROS: Patient denies fever, rash, sore throat, blurred vision, dizziness, nausea, vomiting, diarrhea, cough, shortness of breath or chest pain, joint or back/neck pain, headache, or mood change.    Objective:   No results found. Recent Labs    09/15/22 0706  WBC 3.1*  HGB 9.9*  HCT 31.5*  PLT 197   Recent Labs    09/15/22 0706  NA 137  K 4.3  CL 105  CO2 24  GLUCOSE 118*  BUN 9  CREATININE 0.76  CALCIUM 9.0    Intake/Output Summary (Last 24 hours) at 09/15/2022 1610 Last data filed at 09/15/2022 0730 Gross per 24 hour  Intake 600 ml  Output --  Net 600 ml        Physical Exam: Vital Signs Blood pressure 127/64, pulse 64, temperature 98 F (36.7 C), temperature source Oral, resp. rate 18, height 5\' 6"  (1.676 m), weight 45.2 kg, SpO2 100%.  Gen: no distress, normal appearing HEENT: oral mucosa pink and moist, NCAT Cardio: Reg rate Chest: normal effort, normal rate of breathing Abd: soft, non-distended Ext: no edema Psych: pleasant, normal affect  Skin: multiple erythematous plaques on chest, arms, legs Neuro:  Alert and oriented x 3. Normal insight and awareness. Intact Memory. Sl word finding deficits but easily able to communicate, speech was clear. Cranial nerve exam unremarkable. MMT: RUE and RLE 5/5. LUE 4+/5, LLE 2- to 3/5 prox to distal.   Musculoskeletal: Full ROM, No pain with AROM or PROM in the neck, trunk, or extremities. Posture appropriate     Assessment/Plan: 1. Functional deficits which require 3+ hours per day of interdisciplinary therapy in a comprehensive inpatient rehab setting. Physiatrist is providing close team supervision and 24 hour management of active medical problems listed below. Physiatrist and rehab team  continue to assess barriers to discharge/monitor patient progress toward functional and medical goals  Care Tool:  Bathing    Body parts bathed by patient: Right arm, Left arm, Chest, Abdomen, Front perineal area, Buttocks, Face, Right upper leg, Left upper leg   Body parts bathed by helper: Right lower leg, Left lower leg     Bathing assist Assist Level: Minimal Assistance - Patient > 75%     Upper Body Dressing/Undressing Upper body dressing   What is the patient wearing?: Pull over shirt    Upper body assist Assist Level: Set up assist    Lower Body Dressing/Undressing Lower body dressing      What is the patient wearing?: Pants, Underwear/pull up     Lower body assist Assist for lower body dressing: Minimal Assistance - Patient > 75%     Toileting Toileting    Toileting assist Assist for toileting: Moderate Assistance - Patient 50 - 74%     Transfers Chair/bed transfer  Transfers assist     Chair/bed transfer assist level: Moderate Assistance - Patient 50 - 74% (Min-ModA)     Locomotion Ambulation   Ambulation assist   Ambulation activity did not occur: Safety/medical concerns (requires use of RW for safe  ambulation, pt used SPC at baseline)  Assist level: Moderate Assistance - Patient 50 - 74% Assistive device: Walker-rolling Max distance: 31ft   Walk 10 feet activity   Assist     Assist level: Moderate Assistance - Patient - 50 - 74% Assistive device: Walker-rolling   Walk 50 feet activity   Assist Walk 50 feet with 2 turns activity did not occur: Safety/medical concerns         Walk 150 feet activity   Assist Walk 150 feet activity did not occur: Safety/medical concerns         Walk 10 feet on uneven surface  activity   Assist Walk 10 feet on uneven surfaces activity did not occur: Safety/medical concerns         Wheelchair     Assist Is the patient using a wheelchair?: Yes Type of Wheelchair: Manual     Wheelchair assist level: Minimal Assistance - Patient > 75%, Moderate Assistance - Patient 50 - 74% Max wheelchair distance: 22ft with Min/ModA for maintaining safety    Wheelchair 50 feet with 2 turns activity    Assist        Assist Level: Dependent - Patient 0%   Wheelchair 150 feet activity     Assist      Assist Level: Dependent - Patient 0%   Blood pressure 127/64, pulse 64, temperature 98 F (36.7 C), temperature source Oral, resp. rate 18, height 5\' 6"  (1.676 m), weight 45.2 kg, SpO2 100%.  Medical Problem List and Plan: 1. 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  Antithrombotics: -DVT/anticoagulation:  Pharmaceutical: Lovenox             -antiplatelet therapy: Aspirin 81 mg daily and Plavix 75 mg day x 90 days then aspirin alone 3. Pain Management: Tylenol as needed 4. Mood/Behavior/Sleep: Provide emotional support             -antipsychotic agents: N/A 5. Neuropsych/cognition: This patient is capable of making decisions on her own behalf. 6. Skin/Wound Care: Routine skin checks 7. Fluids/Electrolytes/Nutrition: Routine in and outs with follow-up chemistries 8.  Permissive hypertension.    Patient on Norvasc 2.5 mg daily, Toprol-XL 50 mg daily prior to admission and has been resumed    9.  Hypothyroidism.  Continue Synthroid 10.  Hyperlipidemia.  Continue Crestor 11.  Tobacco use.  Chantix.  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  Patient use topical medications only if needed prior to admission.  Husband to bring in from home.   -otherwise keep plaques clean  13. Microcytic Anemia: Hgb reviewed and is stable, monitor weekly. Iron panel from April  reviewed and is within normal limits  14. Screening for vitamin D deficiency: add on vitamin D level today  15. Underweight: BMI reviewed and is 16.08- continue to monitor and provide dietary education  LOS: 3 days A FACE TO FACE EVALUATION WAS PERFORMED  Lequan Dobratz P Hyacinth Marcelli 09/15/2022, 9:22 AM

## 2022-09-15 NOTE — Progress Notes (Signed)
Occupational Therapy Session Note  Patient Details  Name: Susan Farrell MRN: [id number] Date of Birth: 1945/11/20  Today's Date: 09/15/2022 OT Individual Time: 1324-4010 OT Individual Time Calculation (min): 75 min    Short Term Goals: Week 1:  OT Short Term Goal 1 (Week 1): The pt will complete all functional transfer with ModI  at 95% safe OT Short Term Goal 2 (Week 1): The pt will improve UB strength to 4/5 MMT at 95% safe OT Short Term Goal 3 (Week 1): The pt will donn and doff LB garments with ModI at 95% safe OT Short Term Goal 4 (Week 1): The pt will complete sit to stands at ModI at 95% safe OT Short Term Goal 5 (Week 1): The pt will incorporate relaxation breathing during functional task performance , to reduce the incidence of tremor, for favorable outcome after with initial cue 4 of 5 attempts.  Skilled Therapeutic Interventions/Progress Updates:    Pt received in bed ready for therapy.  Focus of therapy session on postural control. Pt shared that PTA she was only using the cane and was mod I with all self care.  She said her main deficit is a right lean in standing and walking.  Pt sat to EOB independently and then stood from low bed using B hands to push up from bed with light CGA. Stood at [employer] with light CGA as she self corrected her stance when her feet were too close together.   She worked on standing and alternating overhead arm reaches with only a slight R lean.  Used RW to ambulate to toilet with CGA and only minimal R lean.  Pt then toileted with CGA and stepped to shower to shower on bench.  Pt showered in sitting with supervision and then close supervision in standing as pt was holding onto grab bar.  She dried off and then needed CGA as she sat to cross legs to don non slip socks as she did lean to side when leaning over. Pt then ambulated to bed with RW using walker well keeping body close to walker, Dressed EOB with no A for UB and CGA to min A with dynamic standing  when pulling pants over hips and then donning regular socks with shoes and afo sitting cross legged.   Pt then stood up from EOB and ambulated to sink to complete oral care.  Pt tolerated standing for 3-4 minutes to complete task but felt it was very fatiguing. She did need CGA/min A with balance in standing and cues to keep feet separated for improved balance control.    Pt sat in recliner and worked on dynamic sit balance with use of a 2 lb dowel bar for "kayaking" rows, forward rows and reaching bar to feet.  Pt resting in recliner with all needs met. Alarm set and call light in reach.        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 with pt's personal AFO in room Required Braces or Orthoses:  (LLE secondary to previous CVA) Restrictions Weight Bearing Restrictions: No   Pain: Pain Assessment Pain Scale: 0-10 Pain Score: 0-No pain ADL: ADL Eating: Independent Where Assessed-Eating: Other (comment) Grooming: Contact guard Where Assessed-Grooming: Standing at sink Upper Body Bathing: Setup Where Assessed-Upper Body Bathing: Shower Lower Body Bathing: Supervision/safety Where Assessed-Lower Body Bathing: Shower Upper Body Dressing: Independent Where Assessed-Upper Body Dressing: Edge of bed Lower Body Dressing: Contact guard, Minimal assistance  Where Assessed-Lower Body Dressing: Edge of bed, Other (Comment) (standing EOB) Toileting: Contact guard Where Assessed-Toileting: Teacher, adult education: Furniture conservator/restorer Method: Proofreader: Engineer, technical sales: Minimal assistance (based on observation of the pt transferring from bed LOF to the w/c) Tub/Shower Transfer Method: Stand pivot, Event organiser: Administrator, arts Method: Ambulating, Stand pivot [employer]: Grab bars, Transfer tub  bench    Therapy/Group: Individual Therapy  Domanick Cuccia 09/15/2022, 11:05 AM

## 2022-09-15 NOTE — Progress Notes (Signed)
Inpatient Rehabilitation  Patient information reviewed and entered into eRehab system by Melissa M. Bowie, M.A., CCC/SLP, PPS Coordinator.  Information including medical coding, functional ability and quality indicators will be reviewed and updated through discharge.    

## 2022-09-15 NOTE — Progress Notes (Signed)
Physical Therapy Session Note  Patient Details  Name: Susan Farrell MRN: [id number] Date of Birth: 03-09-1945  Today's Date: 09/15/2022 PT Individual Time: 1033-1115 PT Individual Time Calculation (min): 42 min   Short Term Goals: Week 1:  PT Short Term Goal 1 (Week 1): Pt will participate in standardized outcome measure to assess balance and falls risk PT Short Term Goal 2 (Week 1): Pt will perform sit<>stands using LRAD with min A PT Short Term Goal 3 (Week 1): Pt will perform bed<>chair transfers using LRAD with min A PT Short Term Goal 4 (Week 1): Pt will ambulate at least 179ft using LRAD with min A PT Short Term Goal 5 (Week 1): Pt will navigate 4 stairs using HRs per home set-up with min A  Skilled Therapeutic Interventions/Progress Updates:  Patient seated upright in recliner with BLE elevated on entrance to room. Patient alert and agreeable to PT session.   Patient with no pain complaint at start of session.  Therapeutic Activity: Transfers: Pt performed sit<>stand and stand pivot transfers throughout session with slight ataxia to RLE especially when standing. Provided verbal cues for sequencing step but pt able to complete with CGA, intermittent MinA.  Gait Training:  Pt ambulated 37' x2 using RW with overall CGA/ MinA. Demonstrated good ability to place BLE, however, pt's largest disability demonstrated during stance phase on RLE during stabilization. Also has residual decreased step height and initiation of toe off with LLE from previous stroke. Provided vc/ tc throughout for continuous mgmt of RW as pt initially performs step-to gait pattern d/t lack of confidence in RLE. Pt continues to relate that when she stands up, her body tells her to lean to the R side. Not demonstrated in ambulation as pt is able to maintain midline.   Neuromuscular Re-ed: NMR facilitated during session with focus on standing balance and motor control of RLE. Pt guided in toe touches with RLE to  color dots on floor. Without UE support, pt requires ModA to maintain balance and perform. With RUE support, pt completes with heavy lean and CGA. Difficulty in stance noted with toe touches performed with LLE. Pt performs rise to stand and minisquats with good stability and CGA/ MinA overall for balance.   5xSTS initiated and pt completes in 43.33sec. (A score of 15 seconds or greater indicates patient is at an increased risk for falls. Education provided to patient on interpretation of balance score)   NMR performed for improvements in motor control and coordination, balance, sequencing, judgement, and self confidence/ efficacy in performing all aspects of mobility at highest level of independence.   Pt fatigued at end of session and requests to rest until next session while in bed. Is able to return to supine with CGA overall.   Patient supine in bed at end of session with brakes locked, bed alarm set, and all needs within reach.   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   Therapy/Group: Individual Therapy  [name] PT, DPT, CSRS 09/15/2022, 12:32 PM

## 2022-09-15 NOTE — Progress Notes (Signed)
Occupational Therapy Session Note  Patient Details  Name: Susan Farrell MRN: [id number] Date of Birth: 03/01/1945  Today's Date: 09/15/2022 OT Individual Time: 4098-1191 OT Individual Time Calculation (min): 71 min    Short Term Goals: Week 1:  OT Short Term Goal 1 (Week 1): The pt will complete all functional transfer with ModI  at 95% safe OT Short Term Goal 2 (Week 1): The pt will improve UB strength to 4/5 MMT at 95% safe OT Short Term Goal 3 (Week 1): The pt will donn and doff LB garments with ModI at 95% safe OT Short Term Goal 4 (Week 1): The pt will complete sit to stands at ModI at 95% safe OT Short Term Goal 5 (Week 1): The pt will incorporate relaxation breathing during functional task performance , to reduce the incidence of tremor, for favorable outcome after with initial cue 4 of 5 attempts.  Skilled Therapeutic Interventions/Progress Updates:  Pt greeted supine in bed, pt agreeable to OT intervention. Pt completed supine>sit with CGA. Pt donned shoes/AFO from EOB with CGA, pt does present with R lean in sitting but no true LOB. Pt completed stand pivot to w/c with RW and CGA. Total A transport to gym for time mgmt. Pt completed various therapeutic activities focused on dynamic standing balance, core strength, and BUE FMC to facilitate improved independence with ADL participation.   - pt completed seated "wood chops" with 3.3 lb weighted ball to facilitate improved core/UB strength with supervision, MIN cues for technique -pt completed lateral lean to R and L side with pt rolling ball to R and L side of mat to facilitate improved core strength, pt completed task with CGA with no LOB -pt able to stand with no UE support with pt passing weighted ball behind back to simulate LB ADLs and challenge core strength, pt completed task with CGA wit no LOB -pt completed modified sit ups with pt sitting back on wedge and returning to midline x10 reps, pt needed up to mIN A d/t fatigue.  Emphasis on stabilizing core during eccentric contractions.  -graded task up and had pt complete crunches  x10 reps with pt holding weighted ball and then pressing ball forward to work on static strength -pt completed standing dynamic balance task with pt reaching out of BOS to retrieve cones and transition cones across midline with no UE support, graded task up and had pt step onto step and then reach dynamically to balance dots on top of cones, noted impaired motor planning/ataxia in RUE when completing [facility] task.  - pt completed seated ball tosses to trampoline with an emphasis on maintaining core stability when catching ball  - graded task up and had pt complete sit up on wedge and then toss ball to trampoline to improve core strength/stability.   Education provided on various compensatory methods to assist with ataxia in RUE such as supporting elbow on table and applying weight to extremity, instructed pt on seated [facility] task with these methods applied with small difference noted.   9 Hole Peg Test is used to measure finger dexterity in pts with various neurological diagnoses. - Instructions The pt was instructed to pick up the pegs one at a time, using their dominant hand first and put them into the holes in any order until the holes were all filled. The pt then removed the pegs one at a time and returned them to the container. Both hands were tested separately.  - Results The pt completed the test  in 39 seconds ( LUE) RUE- 1 min 7 secs. Scores are based on the time taken to complete the activity. The timer started the moment the pt touched the first peg until the moment the last peg hit the container.  - Norms for healthy females ages 36-70+ 13-55 R 17.38 L 18.92 56-60 R 17.86 L 19.48 61-65 R 18.99 L 20.33 66-70 R 19.90 L 21.44 71+ R 22.49 L 24.11   Pt completed functional mobility back towards room ~ 20 ft with Rw and CGA with close chair follow. Ended session with pt supine in  bed with bed alarm activated and all needs within reach.   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

## 2022-09-15 NOTE — Progress Notes (Addendum)
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 She uses smokeless tobacco. She reports that she does not currently use alcohol. She reports that she does not use drugs.  Family / Support Systems Marital Status: Married Patient Roles: Spouse, Parent Spouse/Significant Other: Windy Fast [phone] Other Supports: Friends and church members Anticipated Caregiver: Windy Fast Ability/Limitations of Caregiver: can assist did last year when was here after CVA Caregiver Availability: 24/7 Family Dynamics: Close with sisters and husband who are all involved and supportive. Pt feels she has good supports via family and friends  Social History Preferred language: English Religion: Pentecostal Cultural Background: No issues Education: HS Health Literacy - How often do you need to have someone help you when you read instructions, pamphlets, or other written material from your doctor or pharmacy?: Never Writes: Yes Employment Status: Retired Marine scientist Issues: No issues Guardian/Conservator: None-according to MD pt is capable of making her own decisions while here   Abuse/Neglect Abuse/Neglect Assessment Can Be Completed: Yes Physical Abuse: Denies Verbal Abuse:  Denies Sexual Abuse: Denies Exploitation of patient/patient's resources: Denies Self-Neglect: Denies  Patient response to: Social Isolation - How often do you feel lonely or isolated from those around you?: Never  Emotional Status Pt's affect, behavior and adjustment status: Pt hopes to get back to mod/i level where she was prior to this stroke. She recovered well before and is hopeful she will do well again. Somewhat discouraged by having to come back here and strat all over. Recent Psychosocial Issues: Other health issues-past CVA 08/2021 Psychiatric History: No issues is optimistic regarding recovery and hopeful she will do well here. Substance Abuse History: Tobacco aware of the risks and the need to quit.  Patient / Family Perceptions, Expectations & Goals Pt/Family understanding of illness & functional limitations: Pt can explain her stroke and deficits she has made progress and hopeful this will continue. She does talk with the MD and feels has a good understanding of her plan moving forward. Premorbid pt/family roles/activities: wife, sister, retiree, freind, church member Anticipated changes in roles/activities/participation: resume Pt/family expectations/goals: Pt states: " I hope to get back to where I was after I recovered from my last stroke."  [employer]: None Premorbid Home Care/DME Agencies: Other (Comment) (rw, cane, 3 in1 tub seat and OP) Transportation available at discharge: husband, pt was driving also Is the patient able to respond to transportation needs?: Yes In the past 12 months, has lack of transportation kept you from medical appointments or from getting medications?: No In the past 12 months, has lack of transportation kept you from meetings, work, or from getting things needed for daily living?: No  Discharge Planning Living Arrangements: Spouse/significant other Support Systems: Spouse/significant other, Other relatives,  Church/faith community Type of Residence: Private residence Insurance Resources: [employer] (specify) (  [place] Medicare) Financial Resources: Social Security, Family Support Financial Screen Referred: No Living Expenses: Own Money Management: Patient, Spouse Does the patient have any problems obtaining your medications?: No Home Management: both Patient/Family Preliminary Plans: Return home with husband assisting if needed. He has last year after first stroke. Pt hopeful she will do well and recover this time also. Aware of rehab process due to was here last year. Care Coordinator Anticipated Follow Up Needs: HH/OP  Clinical Impression Pleasant female who is motivated and aware of the rehab process. She is hopeful she will get back to mod/I level. Her husband assisted last year and can again if needed. Made aware her primary social worker will return on Thursday and will take over then.   Lucy Chris 09/15/2022, 9:46 AM

## 2022-09-15 NOTE — Progress Notes (Signed)
Patient ID: Susan Farrell, female   DOB: 1945-06-04, 77 y.o.   MRN: [id number] Met with the patient to review current situation, rehab process, team conference and plan of care approximately 1 year to the date from previous CVA. Patient confirmed doing well overall until she was not. Reviewed risk factor management including lupus, smoking cessation, HTN, HLD on Crestor (LDL 52/Trig 44) and A1C 6.3. Reviewed DAPT x 3 weeks then ASA solo and medications with dietary modification recommendations. Continue to follow along to address educational needs to facilitate preparation for discharge. Pamelia Hoit

## 2022-09-15 NOTE — IPOC Note (Signed)
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)     Functional Problem List: Nursing Bowel, Pain, Endurance, Medication Management, Safety  PT Balance, Safety, Sensory, Edema, Endurance, Skin Integrity, Motor, Nutrition  OT Endurance, Balance, Other (Comment) (secondary medical dx)  SLP    TR         Basic ADL's: OT Dressing     Advanced  ADL's: OT       Transfers: PT Bed Mobility, Bed to Chair, Car, Furniture  OT       Locomotion: PT Ambulation, Stairs     Additional Impairments: OT Other (comment), Fuctional Use of Upper Extremity (UB strength and functional transfers)  SLP        TR      Anticipated Outcomes Item Anticipated Outcome  Self Feeding ModI  Swallowing      Basic self-care  ModI  Toileting  ModI   Bathroom Transfers ModI  Bowel/Bladder  manage bowel w mod I assist  Transfers  supervision using LRAD  Locomotion  CGA using LRAD  Communication     Cognition     Pain  < 4 with prns  Safety/Judgment  manage w cues   Therapy Plan: PT Intensity: Minimum of 1-2 x/day ,45 to 90 minutes PT Frequency: 5 out of 7 days PT Duration Estimated Length of Stay: 2-2.5 weeks OT Intensity: Minimum of 1-2 x/day, 45 to 90 minutes OT Frequency: Total of 15 hours over 7 days of combined therapies, 5 out of 7 days OT Duration/Estimated Length of Stay: 12-14 days     Team Interventions: Nursing Interventions Patient/Family Education, Bowel Management, Pain Management, Disease Management/Prevention, Medication Management, Discharge Planning  PT interventions Ambulation/gait training, Community reintegration, DME/adaptive equipment instruction, Neuromuscular re-education, Psychosocial support,  Stair training, UE/LE Strength taining/ROM, Warden/ranger, Discharge planning, Functional electrical stimulation, Pain management, Therapeutic Activities, Skin care/wound management, UE/LE Coordination activities, Cognitive remediation/compensation, Disease management/prevention, Functional mobility training, Patient/family education, Splinting/orthotics, Therapeutic Exercise, Visual/perceptual remediation/compensation  OT Interventions Disease mangement/prevention, Neuromuscular re-education, Self Care/advanced ADL retraining, Therapeutic Exercise, UE/LE Coordination activities, Wheelchair propulsion/positioning, Therapeutic Activities, UE/LE Strength taining/ROM  SLP Interventions    TR Interventions    SW/CM Interventions Discharge Planning, Psychosocial Support, Patient/Family Education   Barriers to Discharge MD  Medical stability  Nursing Decreased caregiver support, Home environment access/layout 1 level 2/3 ste no rail w spouse  PT Inaccessible home environment, Home environment access/layout needs HRs on stairs  OT      SLP      SW       Team Discharge Planning: Destination: PT-Home ,OT- Home , SLP-  Projected Follow-up: PT-Outpatient PT, 24 hour supervision/assistance, OT-  Other (comment) (to be determined), SLP-  Projected Equipment Needs: PT-To be determined, OT- Rolling walker with 5" wheels, SLP-  Equipment Details: PT- , OT-  Patient/family involved in discharge planning: PT- Patient,  OT-Patient, SLP-   MD ELOS: 10-14 days Medical Rehab Prognosis:  Excellent Assessment: The patient has been admitted for CIR therapies with the diagnosis of right SCA territory infarction. The team will be addressing functional mobility, strength, stamina, balance, safety, adaptive techniques and equipment, self-care, bowel and bladder mgt, patient and caregiver education. Goals have been set at modI. Anticipated discharge destination is home.        See Team  Conference  Notes for weekly updates to the plan of care

## 2022-09-15 NOTE — Progress Notes (Addendum)
Inpatient Rehabilitation Center Individual Statement of Services  Patient Name:  Susan Farrell  Date:  09/15/2022  Welcome to the Inpatient Rehabilitation Center.  Our goal is to provide you with an individualized program based on your diagnosis and situation, designed to meet your specific needs.  With this comprehensive rehabilitation program, you will be expected to participate in at least 3 hours of rehabilitation therapies Monday-Friday, with modified therapy programming on the weekends.  Your rehabilitation program will include the following services:  Physical Therapy (PT), Occupational Therapy (OT), Speech Therapy (ST), 24 hour per day rehabilitation nursing, Therapeutic Recreaction (TR), Care Coordinator, Rehabilitation Medicine, Nutrition Services, and Pharmacy Services  Weekly team conferences will be held on Wednesday to discuss your progress.  Your Inpatient Rehabilitation Care Coordinator will talk with you frequently to get your input and to update you on team discussions.  Team conferences with you and your family in attendance may also be held.  Expected length of stay: 12-14 days  Overall anticipated outcome: CGA level  Depending on your progress and recovery, your program may change. Your Inpatient Rehabilitation Care Coordinator will coordinate services and will keep you informed of any changes. Your Inpatient Rehabilitation Care Coordinator's name and contact numbers are listed  below.  The following services may also be recommended but are not provided by the Inpatient Rehabilitation Center:  Driving Evaluations Home Health Rehabiltiation Services Outpatient Rehabilitation Services    Arrangements will be made to provide these services after discharge if needed.  Arrangements include referral to agencies that provide these services.  Your insurance has been verified to be:  [employer] Your primary doctor is:  Office manager  Pertinent information will be  shared with your doctor and your insurance company.  Inpatient Rehabilitation Care Coordinator:  Dossie Der, [place] [phone] or Luna Glasgow  Information discussed with and copy given to patient by: Lucy Chris, 09/15/2022, 9:48 AM

## 2022-09-16 DIAGNOSIS — L930 Discoid lupus erythematosus: Secondary | ICD-10-CM | POA: Diagnosis not present

## 2022-09-16 DIAGNOSIS — I10 Essential (primary) hypertension: Secondary | ICD-10-CM | POA: Diagnosis not present

## 2022-09-16 DIAGNOSIS — I63549 Cerebral infarction due to unspecified occlusion or stenosis of unspecified cerebellar artery: Secondary | ICD-10-CM | POA: Diagnosis not present

## 2022-09-16 MED ORDER — ENSURE MAX PROTEIN PO LIQD
11.0000 [oz_av] | Freq: Every day | ORAL | Status: DC
  Administered 2022-09-16 – 2022-09-25 (×10): 11 [oz_av] via ORAL

## 2022-09-16 NOTE — Progress Notes (Signed)
PROGRESS NOTE   Subjective/Complaints: Pt at EOB. No new complaints. Moving well with therapy. Appetite ok. Anxious to get home to husband  ROS: Patient denies fever, rash, sore throat, blurred vision, dizziness, nausea, vomiting, diarrhea, cough, shortness of breath or chest pain, joint or back/neck pain, headache, or mood change.    Objective:   No results found. Recent Labs    09/15/22 0706  WBC 3.1*  HGB 9.9*  HCT 31.5*  PLT 197   Recent Labs    09/15/22 0706  NA 137  K 4.3  CL 105  CO2 24  GLUCOSE 118*  BUN 9  CREATININE 0.76  CALCIUM 9.0    Intake/Output Summary (Last 24 hours) at 09/16/2022 0856 Last data filed at 09/15/2022 1745 Gross per 24 hour  Intake 480 ml  Output --  Net 480 ml        Physical Exam: Vital Signs Blood pressure 130/66, pulse 64, temperature 98.6 F (37 C), resp. rate 17, height 5\' 6"  (1.676 m), weight 45.2 kg, SpO2 100%.  Constitutional: No distress . Vital signs reviewed. HEENT: NCAT, EOMI, oral membranes moist Neck: supple Cardiovascular: RRR without murmur. No JVD    Respiratory/Chest: CTA Bilaterally without wheezes or rales. Normal effort    GI/Abdomen: BS +, non-tender, non-distended Ext: no clubbing, cyanosis, or edema Psych: pleasant and cooperative, reserved Skin: multiple plaques at various stages on chest, arms, legs Neuro:  Alert and oriented x 3. Normal insight and awareness. Intact Memory. Sl word finding deficits but easily able to communicate, speech was clear. Cranial nerve exam unremarkable. MMT: RUE and RLE 5/5. LUE 4+/5, LLE 3- to 3+/5 prox to distal.  Good sitting balance Musculoskeletal: Full ROM, No pain with AROM or PROM in the neck, trunk, or extremities. Posture appropriate     Assessment/Plan: 1. Functional deficits which require 3+ hours per day of interdisciplinary therapy in a comprehensive inpatient rehab setting. Physiatrist is providing  close team supervision and 24 hour management of active medical problems listed below. Physiatrist and rehab team continue to assess barriers to discharge/monitor patient progress toward functional and medical goals  Care Tool:  Bathing    Body parts bathed by patient: Right arm, Left arm, Chest, Abdomen, Front perineal area, Buttocks, Face, Right upper leg, Left upper leg   Body parts bathed by helper: Right lower leg, Left lower leg     Bathing assist Assist Level: Minimal Assistance - Patient > 75%     Upper Body Dressing/Undressing Upper body dressing   What is the patient wearing?: Pull over shirt    Upper body assist Assist Level: Set up assist    Lower Body Dressing/Undressing Lower body dressing      What is the patient wearing?: Pants, Underwear/pull up     Lower body assist Assist for lower body dressing: Minimal Assistance - Patient > 75%     Toileting Toileting    Toileting assist Assist for toileting: Moderate Assistance - Patient 50 - 74%     Transfers Chair/bed transfer  Transfers assist     Chair/bed transfer assist level: Minimal Assistance - Patient > 75%     Locomotion Ambulation   Ambulation  assist   Ambulation activity did not occur: Safety/medical concerns (requires use of RW for safe ambulation, pt used SPC at baseline)  Assist level: Moderate Assistance - Patient 50 - 74% Assistive device: Walker-rolling Max distance: 66ft   Walk 10 feet activity   Assist     Assist level: Moderate Assistance - Patient - 50 - 74% Assistive device: Walker-rolling   Walk 50 feet activity   Assist Walk 50 feet with 2 turns activity did not occur: Safety/medical concerns         Walk 150 feet activity   Assist Walk 150 feet activity did not occur: Safety/medical concerns         Walk 10 feet on uneven surface  activity   Assist Walk 10 feet on uneven surfaces activity did not occur: Safety/medical concerns          Wheelchair     Assist Is the patient using a wheelchair?: Yes Type of Wheelchair: Manual    Wheelchair assist level: Minimal Assistance - Patient > 75%, Moderate Assistance - Patient 50 - 74% Max wheelchair distance: 61ft with Min/ModA for maintaining safety    Wheelchair 50 feet with 2 turns activity    Assist        Assist Level: Dependent - Patient 0%   Wheelchair 150 feet activity     Assist      Assist Level: Dependent - Patient 0%   Blood pressure 130/66, pulse 64, temperature 98.6 F (37 C), resp. rate 17, height 5\' 6"  (1.676 m), weight 45.2 kg, SpO2 100%.  Medical Problem List and Plan: 1. 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  Antithrombotics: -DVT/anticoagulation:  Pharmaceutical: Lovenox             -antiplatelet therapy: Aspirin 81 mg daily and Plavix 75 mg day x 90 days then aspirin alone 3. Pain Management: Tylenol as needed 4. Mood/Behavior/Sleep: Provide emotional support             -antipsychotic agents: N/A 5. Neuropsych/cognition: This patient is capable of making decisions on her own behalf. 6. Skin/Wound Care: Routine skin checks 7. Fluids/Electrolytes/Nutrition:  recent labs reviewed and stable 8.  Permissive hypertension.    Patient on Norvasc 2.5 mg daily, Toprol-XL 50 mg daily prior to admission and has been resumed    8/27 bp well controlled 9.  Hypothyroidism.  Continue Synthroid 10.  Hyperlipidemia.  Continue Crestor 11.  Tobacco use.  Chantix.  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  Patient use topical medications only if needed prior to admission.  Clobetasol cream bid currently   -otherwise keep affected areas  clean  13. Microcytic Anemia: Hgb reviewed and is stable, monitor weekly. Iron panel from April reviewed and is within normal limits  14. Screening for vitamin D deficiency: add on vitamin D level today  15. Underweight: BMI reviewed and is 16.08- continue to monitor and provide dietary education  -pt is eating 70-90% of meals.  -offer protein shakes to boost albumin LOS: 4 days A FACE TO FACE EVALUATION WAS PERFORMED  Ranelle Oyster 09/16/2022, 8:56 AM

## 2022-09-16 NOTE — Progress Notes (Signed)
Occupational Therapy Session Note  Patient Details  Name: KEISHONA MUNRO MRN: [id number] Date of Birth: 11/06/1945  Today's Date: 09/16/2022 OT Individual Time: 1405-1505 OT Individual Time Calculation (min): 60 min    Short Term Goals: Week 1:  OT Short Term Goal 1 (Week 1): The pt will complete all functional transfer with ModI  at 95% safe OT Short Term Goal 2 (Week 1): The pt will improve UB strength to 4/5 MMT at 95% safe OT Short Term Goal 3 (Week 1): The pt will donn and doff LB garments with ModI at 95% safe OT Short Term Goal 4 (Week 1): The pt will complete sit to stands at ModI at 95% safe OT Short Term Goal 5 (Week 1): The pt will incorporate relaxation breathing during functional task performance , to reduce the incidence of tremor, for favorable outcome after with initial cue 4 of 5 attempts.  Skilled Therapeutic Interventions/Progress Updates:    Pt greeted semi-reclined in bed awake and agreeable to OT treatment session. Pt completed bed mobility with supervision, then shoes and L AFO donned with min A. Stand-pivot to wc with RW and CGA. PT brought to therapy gym and ambulated short distance in gym with RW and min A. Addressed standing balance/endurance standing, then incorporated pinch strengthening with graded clothes pins placed around waist and reaching outside base of support to place on basketball net. Pt with decreased distal control of R UE when reaching to place clothes pins on basketball net. Pt reported need to go to the bathroom. She ambulated to bathroom w/ RW and min A with occasional L knee hyperextension. Pt with successful BM and voided bladder. Pt needed min A for balance while completing peri-care and clothing management. Pt ambulated back to therapy mat with min A. Addressed R hand FMC with graded small peg board task. Pt often dropping pegs, but able to complete task. OT issued red theraputty and added beads for fine motor challenge. Pt returned to room and  pivoted back to bed with RW and min A. Pt left seated EOB with alarm on, family present and needs met.   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 with pt's personal AFO in room Required Braces or Orthoses:  (LLE secondary to previous CVA) Restrictions Weight Bearing Restrictions: No Pain:  Pt denies pain   Therapy/Group: Individual Therapy  Mal Amabile 09/16/2022, 2:47 PM

## 2022-09-16 NOTE — Progress Notes (Signed)
Occupational Therapy Session Note  Patient Details  Name: Susan Farrell MRN: [id number] Date of Birth: 31-Oct-1945  Today's Date: 09/16/2022 OT Individual Time: 7829-5621 OT Individual Time Calculation (min): 45 min    Short Term Goals: Week 1:  OT Short Term Goal 1 (Week 1): The pt will complete all functional transfer with ModI  at 95% safe OT Short Term Goal 2 (Week 1): The pt will improve UB strength to 4/5 MMT at 95% safe OT Short Term Goal 3 (Week 1): The pt will donn and doff LB garments with ModI at 95% safe OT Short Term Goal 4 (Week 1): The pt will complete sit to stands at ModI at 95% safe OT Short Term Goal 5 (Week 1): The pt will incorporate relaxation breathing during functional task performance , to reduce the incidence of tremor, for favorable outcome after with initial cue 4 of 5 attempts.  Skilled Therapeutic Interventions/Progress Updates:    Pt received in bed ready for therapy.  Focus of therapy session on dynamic balance skills with standing tasks.     Pt stood from EOB to RW with supervision but relying on support of bed with legs leaning against bed. Declined need to urinate.  Ambulated with CGA with RW to shower bench.  In shower pt bathed with Supervision and one cue for safe foot placement with sit to stand as she pushed her L foot too far forward causing a posterior lean when rising to stand. She was using grab bar for stability.  Pt then ambulated to EOB to dress with supervision only.  After she had finished donning pants (in standing she had more of an anterior/posterior hip sway), she sat down and suddenly got nauseated and vomited.  Pt's RN informed.  Pt stated she felt the timing of the medications affected her.  Brought oral care supplies to pt at EOB so she could cleanse her mouth.  She finished brushing teeth.  Instead of donning socks and shoes, I suggested she lay back and rest as she had over an hour until her next therapy session.     Pt resting in  bed with all needs met. Alarm set and call light in reach.    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 with pt's personal AFO in room Required Braces or Orthoses:  (LLE secondary to previous CVA) Restrictions Weight Bearing Restrictions: No    Vital Signs: Therapy Vitals Temp: 98.6 F (37 C) Pulse Rate: 64 Resp: 17 BP: 130/66 Patient Position (if appropriate): Lying Oxygen Therapy SpO2: 100 % O2 Device: Room Air Pain:  No c/o pain  ADL: ADL Eating: Independent Where Assessed-Eating: Other (comment) Grooming: Contact guard Where Assessed-Grooming: Standing at sink Upper Body Bathing: Setup Where Assessed-Upper Body Bathing: Shower Lower Body Bathing: Supervision/safety Where Assessed-Lower Body Bathing: Shower Upper Body Dressing: Independent Where Assessed-Upper Body Dressing: Edge of bed Lower Body Dressing: Contact guard, Minimal assistance Where Assessed-Lower Body Dressing: Edge of bed, Other (Comment) (standing EOB) Toileting: Contact guard Where Assessed-Toileting: Teacher, adult education: Furniture conservator/restorer Method: Proofreader: Engineer, technical sales: Minimal assistance (based on observation of the pt transferring from bed LOF to the w/c) Tub/Shower Transfer Method: Stand pivot, Event organiser: Administrator, arts Method: Ambulating, Stand pivot [employer]: Grab bars, Transfer tub bench   Therapy/Group: Individual Therapy  Eugine Bubb 09/16/2022, 8:45 AM

## 2022-09-16 NOTE — Progress Notes (Signed)
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

## 2022-09-16 NOTE — Progress Notes (Signed)
Physical Therapy Session Note  Patient Details  Name: Susan Farrell MRN: [id number] Date of Birth: [birth year]  Today's Date: 09/16/2022 PT Individual Time: 1305-1400 PT Individual Time Calculation (min): 55 min   Short Term Goals: Week 1:  PT Short Term Goal 1 (Week 1): Pt will participate in standardized outcome measure to assess balance and falls risk PT Short Term Goal 2 (Week 1): Pt will perform sit<>stands using LRAD with min A PT Short Term Goal 3 (Week 1): Pt will perform bed<>chair transfers using LRAD with min A PT Short Term Goal 4 (Week 1): Pt will ambulate at least 121ft using LRAD with min A PT Short Term Goal 5 (Week 1): Pt will navigate 4 stairs using HRs per home set-up with min A  Skilled Therapeutic Interventions/Progress Updates:     Pt received supine in bed and agrees to therapy. No complaint of pain. Supine to sit with bed features. Pt performs sit to stand and stand step transfer to [facility] with CGA/minA and cues for sequencing and positioning. WC transport to gym. Stand step to mat with RW and CGA and cues for body mechanics. Pt then completes Berg balance test. Pt receives score of 22/56 on Berg balance test, indicating she is at high risk for falls. PT provides interpretation of results. Following rest break, pt ambulates x100' with RW and CGA, with cues for upright gaze to imrpove posture and balance, safe RW management and sequencing for transfer back to mat. Following rest break, pt ambulates additional x75' with RW and same cues and assistance. WC transport back to room. CGA for stand step transfer back to bed with RW. Left seated at EOB with alarm intact and all needs within reach.   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

## 2022-09-17 DIAGNOSIS — I63549 Cerebral infarction due to unspecified occlusion or stenosis of unspecified cerebellar artery: Secondary | ICD-10-CM | POA: Diagnosis not present

## 2022-09-17 NOTE — Patient Care Conference (Signed)
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 Claudette Laws Social Worker Present: Cecile Sheerer, LCSWA Nurse Present: Chana Bode, RN PT Present: Ralph Leyden, PT OT Present: Primitivo Gauze, OT SLP Present: Feliberto Gottron, SLP     Current Status/Progress Goal Weekly Team Focus  Bowel/Bladder   pt continent to bowel and bladder, some episodes of  incontinence LBM 8/27   pt to regain continence   mininize incontient episodes, time toileting    Swallow/Nutrition/ Hydration               ADL's   CGA with ADL transfers and LB dressing and toileting for balance.  close S in standing in shower.   Mod I with basic ADLs   dynamic sit and stand balance and core stability to increase safety with self care tasks, pt education    Mobility   Bed mobility = supervision/ Mod I, Transfers = CGA for balance, Ambulation = CGA/ MinA d/t ataxic RLE during stance phase, stairs = CGA/ MinA   supervision/ CGA - may need upgrading  Barriers: ataxia with RLE WB /// Work On: R sided NMR, standing balance, strengthening including core, family education    Building services engineer Observations               Pain   pt denies pain   patient to remain pain free   assess every shift and  PRn    Skin   skin intact   skin to remain intact  assess every shift and PRN      Discharge Planning:  Pt will d/c to home with support from her husband, family memebrs, and church members. SW will confirm there are no barriers to discharge.   Team Discussion: Patient post cerebellar infarct with hemiplegia, ataxic gait, right side truncal coordination deficits.  Patient on target to meet rehab goals: yes, currently needs supervision for ADLs and CGA for ambulation. Goals for discharge set for supervision - mod I overall.  *See Care Plan and progress notes for long and short-term goals.   Revisions to Treatment Plan:  N/a   Teaching Needs: Safety, medications, transfers, toileting, etc.   Current Barriers to Discharge: None identified  Possible Resolutions to Barriers: OP follow up services     Medical Summary Current Status: RIght hemiataxia, constipated,  Barriers to Discharge: Medical stability  Barriers to Discharge Comments: CHronic LLE weakness from prior CVA Possible Resolutions to Barriers/Weekly Focus: cont CIR, support pt emotionally   Continued Need for Acute Rehabilitation Level of Care: The patient requires daily medical management by a physician with specialized training in physical medicine and rehabilitation for the following reasons: Direction of a multidisciplinary physical rehabilitation program to maximize functional independence : Yes Medical  management of patient stability for increased activity during participation in an intensive rehabilitation regime.: Yes Analysis of laboratory values and/or radiology reports with any subsequent need for medication adjustment and/or medical intervention. : Yes   I attest that I was present, lead the team conference, and concur with the assessment and plan of the team.   Pamelia Hoit 09/17/2022, 4:28 PM

## 2022-09-17 NOTE — Progress Notes (Signed)
Patient ID: Susan Farrell, female   DOB: Aug 19, 1945, 77 y.o.   MRN: [id number]  This SW covering for primary SW [employer].  SW left detailed message for pt husband informing on updates from team conference, and d/c date 9/5. SW discussed outpatient therapies recommended, and DME: RW and [facility] and SW wanting to confirm if she  has already.  *SW received returned phone call from pt husband to discuss above. Husband is here in the room.   SW met with pt and pt husband in room to inform on above. Pt and husband do not think family edu is needed since they have been here before. Outpatient referral should be sent to [facility] DME-RW and [facility] already has items. They are aware primary SW will follow-up with any future updates.    Cecile Sheerer, MSW, LCSWA Office: [phone] Cell: [phone] Fax: [phone]

## 2022-09-17 NOTE — Progress Notes (Signed)
Occupational Therapy Session Note  Patient Details  Name: Susan Farrell MRN: [id number] Date of Birth: [birth year]  Today's Date: 09/17/2022 OT Individual Time: 1305-1400 OT Individual Time Calculation (min): 55 min    Short Term Goals: Week 1:  OT Short Term Goal 1 (Week 1): The pt will complete all functional transfer with ModI  at 95% safe OT Short Term Goal 2 (Week 1): The pt will improve UB strength to 4/5 MMT at 95% safe OT Short Term Goal 3 (Week 1): The pt will donn and doff LB garments with ModI at 95% safe OT Short Term Goal 4 (Week 1): The pt will complete sit to stands at ModI at 95% safe OT Short Term Goal 5 (Week 1): The pt will incorporate relaxation breathing during functional task performance , to reduce the incidence of tremor, for favorable outcome after with initial cue 4 of 5 attempts.  Skilled Therapeutic Interventions/Progress Updates:  Pt greeted supine in bed, pt agreeable to OT Intervention, pt completed supine>sit with supervision with use of bed features. Pt donned shoes/AFO from EOB with total A for time mgmt. Pt completed stand pivot to w/c with Rw and CGA. Total A transport to gym for time mgmt.   Pt completed various therapeutic activities focused on functional reaching, dynamic standing balance and RUE motor planning: -Reaching dynamically to retrieve horseshoes to simulate reaching into cabinets with RUE  with unilateral support and CGA - removing squigz from mirror with RUE to challenge grip strength and RUE motor planning - functional mobility in hallway with pt instructed to reach dynamically to collect horseshoes from both side of hallway with Rw and CGA  Orthotist present in gym with pt asking orthotist to adjust AFO, therefore focused remainder of session on seated tasks d/t lack of AFO for functional ambulation  Pt completed seated FMC task where pt instructed to recreate leggo structure from visual aid provided. Pt having increased difficulty with  this task needing max step by step cues to recreate leggo structure accurately. Pt reports increased "difficulty knowing where things go." Added weight to RUE ( 2.5 lbs) to decrease ataxia in RUE, slight improvement noted.   Pt transported back to room with total A with pt completing stand pivot back to bed with RW and CGA. Ended session with pt supine in bed with bed alarm activated and all needs within reach.   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

## 2022-09-17 NOTE — Progress Notes (Signed)
PROGRESS NOTE   Subjective/Complaints: Pt at EOB. No new complaints. Moving well with therapy. Appetite ok. Anxious to get home to husband  ROS: Patient denies fever, rash, sore throat, blurred vision, dizziness, nausea, vomiting, diarrhea, cough, shortness of breath or chest pain, joint or back/neck pain, headache, or mood change.    Objective:   No results found. Recent Labs    09/15/22 0706  WBC 3.1*  HGB 9.9*  HCT 31.5*  PLT 197   Recent Labs    09/15/22 0706  NA 137  K 4.3  CL 105  CO2 24  GLUCOSE 118*  BUN 9  CREATININE 0.76  CALCIUM 9.0    Intake/Output Summary (Last 24 hours) at 09/17/2022 4401 Last data filed at 09/16/2022 1700 Gross per 24 hour  Intake 472 ml  Output --  Net 472 ml        Physical Exam: Vital Signs Blood pressure 128/76, pulse 68, temperature 98.8 F (37.1 C), temperature source Oral, resp. rate 16, height 5\' 6"  (1.676 m), weight 45.2 kg, SpO2 100%.   General: No acute distress Mood and affect are appropriate Heart: Regular rate and rhythm no rubs murmurs or extra sounds Lungs: Clear to auscultation, breathing unlabored, no rales or wheezes Abdomen: Positive bowel sounds, soft nontender to palpation, nondistended Extremities: No clubbing, cyanosis, or edema Skin: No evidence of breakdown, no evidence of rash  Cerebellar exam + dymetria Right FNF and Heel shin, normal left FNF, Left  H->shin limited by weakness  Musculoskeletal: Full range of motion in all 4 extremities. No joint swelling  Neuro:  Alert and oriented x 3. Normal insight and awareness. Intact Memory. Sl word finding deficits but easily able to communicate, speech was clear. Cranial nerve exam unremarkable. MMT: RUE and RLE 5/5. LUE 4+/5, LLE 3- to 3+/5 prox to distal.  Good sitting balance Musculoskeletal: Full ROM, No pain with AROM or PROM in the neck, trunk, or extremities. Posture appropriate      Assessment/Plan: 1. Functional deficits which require 3+ hours per day of interdisciplinary therapy in a comprehensive inpatient rehab setting. Physiatrist is providing close team supervision and 24 hour management of active medical problems listed below. Physiatrist and rehab team continue to assess barriers to discharge/monitor patient progress toward functional and medical goals  Care Tool:  Bathing    Body parts bathed by patient: Right arm, Left arm, Chest, Abdomen, Front perineal area, Buttocks, Face, Right upper leg, Left upper leg   Body parts bathed by helper: Right lower leg, Left lower leg     Bathing assist Assist Level: Minimal Assistance - Patient > 75%     Upper Body Dressing/Undressing Upper body dressing   What is the patient wearing?: Pull over shirt    Upper body assist Assist Level: Set up assist    Lower Body Dressing/Undressing Lower body dressing      What is the patient wearing?: Pants, Underwear/pull up     Lower body assist Assist for lower body dressing: Minimal Assistance - Patient > 75%     Toileting Toileting    Toileting assist Assist for toileting: Moderate Assistance - Patient 50 - 74%  Transfers Chair/bed transfer  Transfers assist     Chair/bed transfer assist level: Minimal Assistance - Patient > 75%     Locomotion Ambulation   Ambulation assist   Ambulation activity did not occur: Safety/medical concerns (requires use of RW for safe ambulation, pt used SPC at baseline)  Assist level: Moderate Assistance - Patient 50 - 74% Assistive device: Walker-rolling Max distance: 39ft   Walk 10 feet activity   Assist     Assist level: Moderate Assistance - Patient - 50 - 74% Assistive device: Walker-rolling   Walk 50 feet activity   Assist Walk 50 feet with 2 turns activity did not occur: Safety/medical concerns         Walk 150 feet activity   Assist Walk 150 feet activity did not occur: Safety/medical  concerns         Walk 10 feet on uneven surface  activity   Assist Walk 10 feet on uneven surfaces activity did not occur: Safety/medical concerns         Wheelchair     Assist Is the patient using a wheelchair?: Yes Type of Wheelchair: Manual    Wheelchair assist level: Minimal Assistance - Patient > 75%, Moderate Assistance - Patient 50 - 74% Max wheelchair distance: 58ft with Min/ModA for maintaining safety    Wheelchair 50 feet with 2 turns activity    Assist        Assist Level: Dependent - Patient 0%   Wheelchair 150 feet activity     Assist      Assist Level: Dependent - Patient 0%   Blood pressure 128/76, pulse 68, temperature 98.8 F (37.1 C), temperature source Oral, resp. rate 16, height 5\' 6"  (1.676 m), weight 45.2 kg, SpO2 100%.  Medical Problem List and Plan: 1. 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  Antithrombotics: -DVT/anticoagulation:  Pharmaceutical: Lovenox             -antiplatelet therapy: Aspirin 81 mg daily and Plavix 75 mg day x 90 days then aspirin alone 3. Pain Management: Tylenol as needed 4. Mood/Behavior/Sleep: Provide emotional support             -antipsychotic agents: N/A 5. Neuropsych/cognition: This patient is capable of making decisions on her own behalf. 6. Skin/Wound Care: Routine skin checks 7. Fluids/Electrolytes/Nutrition:  recent labs reviewed and stable 8.  Permissive hypertension.    Patient on Norvasc 2.5 mg daily, Toprol-XL 50 mg daily prior to admission and has been resumed    8/27 bp well controlled 9.  Hypothyroidism.  Continue Synthroid 10.  Hyperlipidemia.  Continue Crestor 11.  Tobacco use.  Chantix.   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  Patient use topical medications only if needed prior to admission.  Clobetasol cream bid currently   -otherwise keep affected areas clean  13. Microcytic Anemia: Hgb reviewed and is stable, monitor weekly. Iron panel from April reviewed and is within normal limits  14. Screening for vitamin D deficiency: add on vitamin D level today  15. Underweight: BMI reviewed and is 16.08- continue to monitor and provide dietary education  -  pt is eating 70-90% of meals.  -offer protein shakes to boost albumin LOS: 5 days A FACE TO FACE EVALUATION WAS PERFORMED  Erick Colace 09/17/2022, 8:08 AM

## 2022-09-17 NOTE — Progress Notes (Signed)
Physical Therapy Session Note  Patient Details  Name: Susan Farrell MRN: [id number] Date of Birth: 01/27/1945  Today's Date: 09/17/2022 PT Individual Time: 0865-7846; 1105 - 1200 PT Individual Time Calculation (min): 40 min; 55 min   Short Term Goals: Week 1:  PT Short Term Goal 1 (Week 1): Pt will participate in standardized outcome measure to assess balance and falls risk PT Short Term Goal 2 (Week 1): Pt will perform sit<>stands using LRAD with min A PT Short Term Goal 3 (Week 1): Pt will perform bed<>chair transfers using LRAD with min A PT Short Term Goal 4 (Week 1): Pt will ambulate at least 149ft using LRAD with min A PT Short Term Goal 5 (Week 1): Pt will navigate 4 stairs using HRs per home set-up with min A  SESSION 1  Skilled Therapeutic Interventions/Progress Updates: Patient supine in bed on entrance to room. Patient alert and agreeable to PT session.   Patient reported no pain this morning and endorses getting adequate sleep over the night. Pt donned L AFO and B personal shoes with supervision on EOB and demos dynamic sitting balance with anterior weight shifting with supervision for safety.   Therapeutic Activity: Bed Mobility: Pt performed supine<>sit on EOB with supervision for safety (HOB elevated. No VC required.  Transfers: Pt performed sit<>stand transfers throughout session with supervision and to RW. With VC to reach back to surface to control descent, and to push with one UE vs B UE on WC.  Gait Training:  Pt ambulated from room to ortho gym using RW with CGA at first for safety, then close supervision. Pt presented with decreased cadence, step clearance and length bilaterally with decreased stance time on R LE (pt reported feeling like she is being pulled to the R). Pt also with decreased L hip flexion  Neuromuscular Re-ed: NMR facilitated during session with focus on B LE WB for proprioceptive feedback and neuromuscular control. - Step to 2" step with L LE x  7 (pt instructed to perform 10 but fatigued after 7 reps). Pt with VC to laterally lean accordingly to advance L LE, and to step laterally to get adjusted in front of step. Pt performed with min/modA and with R HHA. Pt then performed x 9 taps with the R LE and required closer to modA (R HHA - pt reported L LE feels weaker than R). Pt demos L lean to advance R LE onto step, and maintains R LE in knee flexion with pulling R hip further back into extension behind L LE. Pt then performed another round with tapping with R LE, this time with cues to return R LE to neutral BOS (external target being tape on floor) and to activate R LE quadriceps when stepping back into stance (performed x 5 before requiring a rest break).  NMR performed for improvements in motor control and coordination, balance, sequencing, judgement, and self confidence/ efficacy in performing all aspects of mobility at highest level of independence.   Patient sitting EOB at end of session with brakes locked, bed alarm set, and all needs within reach.  SESSION 2 Skilled Therapeutic Interventions/Progress Updates: Patient supine in bed on entrance to room. Patient alert and agreeable to PT session.   Patient reported no change in status since previous session. Pt donned L AFO and B personal shoes on EOB and demos dynamic sitting balance with anterior weight shifting with light minA to fit L LE into shoe due to back of shoe folding into heel, otherwise  supervision.  Therapeutic Activity: Bed Mobility: Pt performed supine to sit on EOB with supervision. No VC required.  Transfers: Pt performed sit<>stand transfers throughout session with supervision. Provided VC for pt to reach back to surface to control descent.  Gait Training:  Pt ambulated 32' x 1 and 55' x 1 using RW with CGA/supervision for safety (and WC follow). Pt cued to increase hip flexion on L LE per decreased presentation. Pt noted to have good carryover following NMRE  interventions, and reported feeling it was a little easer to bring L LE through swing phase (still decreased in clearance, but less moments of foot catching floor, and increase in L step length).  Neuromuscular Re-ed: NMR facilitated during session with focus on B LE WB for proprioceptive feedback and neuromuscular control for gait carryover.  - Stepping to 6" step with R LE with 5lb ankle weight (pt use of R HR) and CGA for assistance. Pt presented with L knee flexion in stance with cues to activate quadriceps when stepping (pt noted to have decreased stability in maintaining extension). Pt performed another round till fatigue with B UE on HR with L LE in full extension. Pt educated on reasoning with increasing quadriceps activation during stance phase. - Stepping to 3" step with L LE with 5lb ankle weight on L LE and PTA using yellow theraband around distal thigh pulling in to extension. Pt with B UE on B HR, and supervision at first for safety. Pt noted to have decreased stability with R LE shaking a few times and required CGA. Pt performed until fatigue and cued to drive distal thigh through theraband. Pt performed another round until fatigue with same cues and CGA.  NMR performed for improvements in motor control and coordination, balance, sequencing, judgement, and self confidence/ efficacy in performing all aspects of mobility at highest level of independence.   Patient sitting in [facility] with NT present changing linen on bed at end of session with brakes locked, and all needs within reach.       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

## 2022-09-17 NOTE — Plan of Care (Signed)
  Problem: Consults Goal: RH STROKE PATIENT EDUCATION Description: See Patient Education module for education specifics  Outcome: Progressing   Problem: RH BOWEL ELIMINATION Goal: RH STG MANAGE BOWEL WITH ASSISTANCE Description: STG Manage Bowel with toileting Assistance. Outcome: Progressing Goal: RH STG MANAGE BOWEL W/MEDICATION W/ASSISTANCE Description: STG Manage Bowel with Medication with mod I Assistance. Outcome: Progressing   Problem: RH SAFETY Goal: RH STG ADHERE TO SAFETY PRECAUTIONS W/ASSISTANCE/DEVICE Description: STG Adhere to Safety Precautions With cues Assistance/Device. Outcome: Progressing   Problem: RH PAIN MANAGEMENT Goal: RH STG PAIN MANAGED AT OR BELOW PT'S PAIN GOAL Description: < 4 with prns Outcome: Progressing   Problem: RH KNOWLEDGE DEFICIT Goal: RH STG INCREASE KNOWLEDGE OF DIABETES Description: Patient and spouse will be able to manage prediabetes with dietary modification using educational resources independently Outcome: Progressing Goal: RH STG INCREASE KNOWLEDGE OF HYPERTENSION Description: Patient and spouse will be able to manage HTN with medications and dietary modification using educational resources independently Outcome: Progressing Goal: RH STG INCREASE KNOWLEGDE OF HYPERLIPIDEMIA Description: Patient and spouse will be able to manage HLD with medications and dietary modification using educational resources independently Outcome: Progressing Goal: RH STG INCREASE KNOWLEDGE OF STROKE PROPHYLAXIS Description: Patient and spouse will be able to manage secondary risks with medications and dietary modification using educational resources independently Outcome: Progressing   Problem: Education: Goal: Knowledge of disease or condition will improve Outcome: Progressing Goal: Knowledge of secondary prevention will improve (MUST DOCUMENT ALL) Outcome: Progressing Goal: Knowledge of patient specific risk factors will improve Loraine Leriche N/A or DELETE  if not current risk factor) Outcome: Progressing   Problem: Ischemic Stroke/TIA Tissue Perfusion: Goal: Complications of ischemic stroke/TIA will be minimized Outcome: Progressing   Problem: Coping: Goal: Will verbalize positive feelings about self Outcome: Progressing Goal: Will identify appropriate support needs Outcome: Progressing   Problem: Health Behavior/Discharge Planning: Goal: Ability to manage health-related needs will improve Outcome: Progressing Goal: Goals will be collaboratively established with patient/family Outcome: Progressing   Problem: Self-Care: Goal: Ability to participate in self-care as condition permits will improve Outcome: Progressing Goal: Verbalization of feelings and concerns over difficulty with self-care will improve Outcome: Progressing Goal: Ability to communicate needs accurately will improve Outcome: Progressing   Problem: Nutrition: Goal: Risk of aspiration will decrease Outcome: Progressing Goal: Dietary intake will improve Outcome: Progressing

## 2022-09-17 NOTE — Progress Notes (Signed)
Occupational Therapy Session Note  Patient Details  Name: Susan Farrell MRN: [id number] Date of Birth: January 26, 1945  Today's Date: 09/17/2022 OT Individual Time: 0918-1000 OT Individual Time Calculation (min): 42 min    Short Term Goals: Week 1:  OT Short Term Goal 1 (Week 1): The pt will complete all functional transfer with ModI  at 95% safe OT Short Term Goal 2 (Week 1): The pt will improve UB strength to 4/5 MMT at 95% safe OT Short Term Goal 3 (Week 1): The pt will donn and doff LB garments with ModI at 95% safe OT Short Term Goal 4 (Week 1): The pt will complete sit to stands at ModI at 95% safe OT Short Term Goal 5 (Week 1): The pt will incorporate relaxation breathing during functional task performance , to reduce the incidence of tremor, for favorable outcome after with initial cue 4 of 5 attempts.  Skilled Therapeutic Interventions/Progress Updates:    Pt received sitting EOB ready for therapy.  Focus of therapy session on RUE coordination with core strength.    Pt requested to work in room as she had just completed working on walking with PT.   Pt sat EOB to engage in several activities to challenge RUE coordination, core strength and dynamic sit balance.   With lightweight ball: -small tosses upward and catching. Pt able to complete successfully if tossing to shoulder or chin height but if she tossed overhead unable to coordinate R hand with L to catch ball -D1 and D2 cross chops with a focus on R arm lengthening and torso extension -torso twists with ball  Hula hoop; -R arm coordination with "driving" motion -twisting hoop around body -lifting hoop overhead and stretching arms  2 lb dowel bar -"kayaking" -"rowing"  Pt able to repeat each exercise 15 x for 3 sets.   Good participation. Pt resting EOB  with all needs met. Alarm set and call light in reach.        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

## 2022-09-18 DIAGNOSIS — I63549 Cerebral infarction due to unspecified occlusion or stenosis of unspecified cerebellar artery: Secondary | ICD-10-CM | POA: Diagnosis not present

## 2022-09-18 NOTE — Progress Notes (Signed)
The patient had once episode of clear emesis, pt denies nausea after vomiting. States she feels it was due to taking her chantix on an empty stomach states this has happen in the past.

## 2022-09-18 NOTE — Progress Notes (Signed)
Patient ID: Susan Farrell, female   DOB: [birth year], 77 y.o.   MRN: [id number]  This SW covering for primary SW [employer].  Outpatient PT/OT referral faxed to [facility] ([phone]-f:[phone]).  Cecile Sheerer, MSW, LCSWA Office: [phone] Cell: [phone] Fax: [phone]

## 2022-09-18 NOTE — Progress Notes (Signed)
Occupational Therapy Session Note  Patient Details  Name: Susan Farrell MRN: [id number] Date of Birth: October 24, 1945  Today's Date: 09/18/2022 OT Individual Time: 1355-1505 OT Individual Time Calculation (min): 70 min    Short Term Goals: Week 1:  OT Short Term Goal 1 (Week 1): The pt will complete all functional transfer with ModI  at 95% safe OT Short Term Goal 2 (Week 1): The pt will improve UB strength to 4/5 MMT at 95% safe OT Short Term Goal 3 (Week 1): The pt will donn and doff LB garments with ModI at 95% safe OT Short Term Goal 4 (Week 1): The pt will complete sit to stands at ModI at 95% safe OT Short Term Goal 5 (Week 1): The pt will incorporate relaxation breathing during functional task performance , to reduce the incidence of tremor, for favorable outcome after with initial cue 4 of 5 attempts.  Skilled Therapeutic Interventions/Progress Updates:   Pt greeted supine in bed with pt agreeable to OT intervention. Supine>sit with supervision. Pt able to don shoes/AFO with MIN A d/t time mgmt. Pt completed stand pivot to w/c with RW and CGA. Total A transport to gym for time mgmt. Session focused on various therapeutic activities focused on dynamic standing balance, LLE NMR, LB strength/endurance:  -Side stepping on aerobics step to R<>L to transport horseshoes from L side to R to challenge dynamic balance and practice side steps to simulate kitchen mobility in tight space. Pt completed task with BUE support with CGA.  -Reciprocal stepping onto 3 inch step with BUE support with CGA, noted ataxia in LLE.  -Standing leg raises laterally with BUE support to R and L side to challenge LB strength/endurance and provide NMR to LLE, pt completed task with CGA, decreased AROM noted in LLE vs RLE -standing donkey kicks and standing marches with an emphasis on LB strength and LLE NMR, pt with decreased AROM in LLE   Supine therex to increase LB strength/endurance for higher level functional  mobility tasks: -straight leg raises for LLE with pt needing MAX A to elevate x10 reps -x10 glute bridges with pt completing static hold for 10 secs during last rep -x10 glute bridges while holding compliant ball at inner thighs to activate inner thigh strength -x10 straight leg raises with RLE -x10 hip knee flexion/extension on LLE with pt needing MODA to complete task.  -x10 glute bridges with yoga block positioned at inner thighs with pt reaching OH as hips lifted, integrated breath into movement to allow for activation of the parasympathetic nervous system.   Functional mobility obstacle course with an emphasis on short turns to simulate home level mobility with Rw, pt instructed to ambulate in between 2 tables ot transport horseshoes from R to L side. Pt needed seated rest breaks after 3 horseshoes d/t fatigue. Pt completed task with CGA.  Pt completed functional ambulation in ADL apt to transfer from couch to recliner with RW and CGA.   Ended session with pt supine in bed with bed alarm activated and all needs within reach.  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

## 2022-09-18 NOTE — Evaluation (Signed)
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 Per chart review patient lives with spouse. 1 level home 2-3 steps to entry. Independent driving prior to admission. Presented [facility] with acute onset of dizziness, nausea and vomiting while eating dinner. MRI showed acute infarct in the right superior cerebellum and vermis. No evidence of hemorrhagic transformation. CTA head and neck no intracranial large vessel occlusion. 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 No hemodynamically significant stenosis in the neck. Admission chemistries unremarkable except glucose 138, hemoglobin 10.1, troponin negative, TSH 0.448, free T4 1.24, hemoglobin A1c 6.3. Echocardiogram with ejection fraction of 60 to 65% no wall motion abnormalities grade 1 diastolic dysfunction. Neurology follow-up placed on aspirin 81 mg daily as  well as Plavix 75 mg daily x 3 months then aspirin alone. Lovenox added for DVT prophylaxis. Tolerating a regular consistency diet. Therapy evaluations completed due to patient decreased functional mobility/dizziness was admitted for a comprehensive rehab program. Patient transferred to CIR on 09/12/2022.    Pt presents with decreased activity tolerance, decreased functional mobility, decreased balance, decreased coordination, feelings of stress Limiting pt's independence with leisure/community pursuits.  Met with pt today to discuss TR services including leisure education, activity analysis/modifications and stress management.  Also discussed the importance of social, emotional, spiritual health in addition to physical health and their effects on overall health and wellness.  Pt stated understanding.  Plan  Min 1 session >20 minutes during LOS Recommendations for other services: None   Discharge Criteria: Patient will be discharged from TR if patient refuses treatment 3 consecutive times without medical reason.  If treatment goals not met, if there is a change in medical status, if patient makes no progress towards goals or if patient is discharged from hospital.  The above assessment, treatment plan, treatment alternatives and goals were discussed and mutually agreed upon: by patient  Jameek Bruntz 09/18/2022, 9:51 AM

## 2022-09-18 NOTE — Progress Notes (Signed)
PROGRESS NOTE   Subjective/Complaints:   ROS: Patient denies fever, rash, sore throat, blurred vision, dizziness, nausea, vomiting, diarrhea, cough, shortness of breath or chest pain, joint or back/neck pain, headache, or mood change.    Objective:   No results found. No results for input(s): "WBC", "HGB", "HCT", "PLT" in the last 72 hours.  No results for input(s): "NA", "K", "CL", "CO2", "GLUCOSE", "BUN", "CREATININE", "CALCIUM" in the last 72 hours.   Intake/Output Summary (Last 24 hours) at 09/18/2022 0728 Last data filed at 09/17/2022 1700 Gross per 24 hour  Intake 138 ml  Output --  Net 138 ml        Physical Exam: Vital Signs Blood pressure 134/61, pulse 69, temperature 98.8 F (37.1 C), resp. rate 18, height 5\' 6"  (1.676 m), weight 45.2 kg, SpO2 100%.   General: No acute distress Mood and affect are appropriate Heart: Regular rate and rhythm no rubs murmurs or extra sounds Lungs: Clear to auscultation, breathing unlabored, no rales or wheezes Abdomen: Positive bowel sounds, soft nontender to palpation, nondistended Extremities: No clubbing, cyanosis, or edema Skin: No evidence of breakdown, no evidence of rash  Cerebellar exam + dysmetria Right FNF and Heel shin, normal left FNF, Left  H->shin limited by weakness  Musculoskeletal: Full range of motion in all 4 extremities. No joint swelling  Neuro:  Alert and oriented x 3. Normal insight and awareness. Intact Memory. Sl word finding deficits but easily able to communicate, speech was clear. Cranial nerve exam unremarkable. MMT: RUE and RLE 5/5. LUE 4+/5, LLE 3- to 3+/5 prox to distal.  Good sitting balance Musculoskeletal: Full ROM, No pain with AROM or PROM in the neck, trunk, or extremities.   Assessment/Plan: 1. Functional deficits which require 3+ hours per day of interdisciplinary therapy in a comprehensive inpatient rehab setting. Physiatrist is  providing close team supervision and 24 hour management of active medical problems listed below. Physiatrist and rehab team continue to assess barriers to discharge/monitor patient progress toward functional and medical goals  Care Tool:  Bathing    Body parts bathed by patient: Right arm, Left arm, Chest, Abdomen, Front perineal area, Buttocks, Face, Right upper leg, Left upper leg   Body parts bathed by helper: Right lower leg, Left lower leg     Bathing assist Assist Level: Minimal Assistance - Patient > 75%     Upper Body Dressing/Undressing Upper body dressing   What is the patient wearing?: Pull over shirt    Upper body assist Assist Level: Set up assist    Lower Body Dressing/Undressing Lower body dressing      What is the patient wearing?: Pants, Underwear/pull up     Lower body assist Assist for lower body dressing: Minimal Assistance - Patient > 75%     Toileting Toileting    Toileting assist Assist for toileting: Moderate Assistance - Patient 50 - 74%     Transfers Chair/bed transfer  Transfers assist     Chair/bed transfer assist level: Minimal Assistance - Patient > 75%     Locomotion Ambulation   Ambulation assist   Ambulation activity did not occur: Safety/medical concerns (requires use of RW for safe ambulation,  pt used SPC at baseline)  Assist level: Moderate Assistance - Patient 50 - 74% Assistive device: Walker-rolling Max distance: 58ft   Walk 10 feet activity   Assist     Assist level: Moderate Assistance - Patient - 50 - 74% Assistive device: Walker-rolling   Walk 50 feet activity   Assist Walk 50 feet with 2 turns activity did not occur: Safety/medical concerns         Walk 150 feet activity   Assist Walk 150 feet activity did not occur: Safety/medical concerns         Walk 10 feet on uneven surface  activity   Assist Walk 10 feet on uneven surfaces activity did not occur: Safety/medical concerns          Wheelchair     Assist Is the patient using a wheelchair?: Yes Type of Wheelchair: Manual    Wheelchair assist level: Minimal Assistance - Patient > 75%, Moderate Assistance - Patient 50 - 74% Max wheelchair distance: 9ft with Min/ModA for maintaining safety    Wheelchair 50 feet with 2 turns activity    Assist        Assist Level: Dependent - Patient 0%   Wheelchair 150 feet activity     Assist      Assist Level: Dependent - Patient 0%   Blood pressure 134/61, pulse 69, temperature 98.8 F (37.1 C), resp. rate 18, height 5\' 6"  (1.676 m), weight 45.2 kg, SpO2 100%.  Medical Problem List and Plan: 1. 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  Antithrombotics: -DVT/anticoagulation:  Pharmaceutical: Lovenox             -antiplatelet therapy: Aspirin 81 mg daily and Plavix 75 mg day x 90 days then aspirin alone 3. Pain Management: Tylenol as needed 4. Mood/Behavior/Sleep: Provide emotional support             -antipsychotic agents: N/A 5. Neuropsych/cognition: This patient is capable of making decisions on her own behalf. 6. Skin/Wound Care: Routine skin checks 7. Fluids/Electrolytes/Nutrition:  recent labs reviewed and stable 8.  Permissive hypertension.    Patient on Norvasc 2.5 mg daily, Toprol-XL 50 mg daily prior to admission and has been resumed    8/27 bp well controlled 9.  Hypothyroidism.  Continue Synthroid 10.  Hyperlipidemia.  Continue Crestor 11.  Tobacco use.  Chantix.  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  Patient use topical medications only if needed prior to admission.  Clobetasol cream bid currently   -otherwise keep affected areas clean  13.  Microcytic Anemia: Hgb reviewed and is stable, monitor weekly. Iron panel from April reviewed and is within normal limits  14. Screening for vitamin D deficiency: add on vitamin D level today  15. Underweight: BMI reviewed and is 16.08- continue to monitor and provide dietary education  -pt is eating 70-90% of meals.  -offer protein shakes to boost albumin LOS: 6 days A FACE TO FACE EVALUATION WAS PERFORMED  Erick Colace 09/18/2022, 7:28 AM

## 2022-09-18 NOTE — Progress Notes (Signed)
Physical Therapy Session Note  Patient Details  Name: Susan Farrell MRN: [id number] Date of Birth: Mar 27, 1945  Today's Date: 09/18/2022 PT Individual Time: 1110-1128 PT Individual Time Calculation (min): 18 min  Today's Date: 09/18/2022 PT Missed Time: 42 Minutes Missed Time Reason: Patient ill (Comment) (Pt had emetic event shortly after therapy initiated)  Short Term Goals: Week 1:  PT Short Term Goal 1 (Week 1): Pt will participate in standardized outcome measure to assess balance and falls risk PT Short Term Goal 2 (Week 1): Pt will perform sit<>stands using LRAD with min A PT Short Term Goal 3 (Week 1): Pt will perform bed<>chair transfers using LRAD with min A PT Short Term Goal 4 (Week 1): Pt will ambulate at least 163ft using LRAD with min A PT Short Term Goal 5 (Week 1): Pt will navigate 4 stairs using HRs per home set-up with min A  Pt was seen R sidelying in bed on entrance to room and sat to EOB with supervision for safety. Pt reported not having any pain or complications at beginning of session. Pt donned B personal shoes and L AFO with supervision and demos anterior weight shift to do so with no forward LOB. Pt then ambulated from EOB to nsg station (about 50') with close supervision and VC for pt to increase L hip flexion to increase L LE clearance per toe drag presentation. Pt requested to sit in [facility] that PTA followed with and requested an emetic bag a few seconds later. PTA returned quickly with bag and pt vomited in bag for several minutes. Pt returned dependently in room still vomiting and was provided with wet/dry wash cloths and cold sprite. RN arrived and offered medication, but pt denied wanting it (pt also reported that she feels like it is the medication she is being given that started it as she also had an emesis event the previous night). Pt ambulated a few steps with RHHA and VC to avoid reaching excessively to [facility] rail as it was unsafe (pt agreed). PT deferred for pt  safety (RN to reach out to PA). Pt denied any other symptoms such as stomach pain, chills, cramps, but still felt nauseas. Pt left sitting EOB with bed alarm set, all needs in reach, and brakes locked. Will re-attempt as schedule and pt availability permits.       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

## 2022-09-18 NOTE — Progress Notes (Signed)
Occupational Therapy Session Note  Patient Details  Name: Susan Farrell MRN: [id number] Date of Birth: 25-Jun-1945  Today's Date: 09/18/2022 OT Individual Time: 0830-0930 OT Individual Time Calculation (min): 60 min    Short Term Goals: Week 1:  OT Short Term Goal 1 (Week 1): The pt will complete all functional transfer with ModI  at 95% safe OT Short Term Goal 2 (Week 1): The pt will improve UB strength to 4/5 MMT at 95% safe OT Short Term Goal 3 (Week 1): The pt will donn and doff LB garments with ModI at 95% safe OT Short Term Goal 4 (Week 1): The pt will complete sit to stands at ModI at 95% safe OT Short Term Goal 5 (Week 1): The pt will incorporate relaxation breathing during functional task performance , to reduce the incidence of tremor, for favorable outcome after with initial cue 4 of 5 attempts.  Skilled Therapeutic Interventions/Progress Updates:    Pt received in bed ready for therapy.  Focus of therapy session on dynamic balance and RUE coordination. Pt requested to shower.  She ambulated to shower with RW with CGA.  Once in shower bathed with supervision,  returned to bed to dress. Able to dress with CGA when standing pulling pants over hips.  She needs to work on her response time if her balance shifts too far out of her base of support.  She continues to need cues to ensure her balance is stable prior to pulling pants over hips.    Pt taken to gym via w/c to work on R hand [facility]. Box and block test R hand 26 blocks in 60 seconds.  Pt able to prehend fingers to pick up blocks and move them but at a slow pace.   Push pin puzzle with no difficulty using R hand.   R shoulder stability and coordination exercises with 1 lb ball   Recommended she work on these same ex in room with just the weight of her arm.  Pt resting in bed with all needs met. Alarm set and call light in reach.        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 with pt's personal AFO in room Required Braces or Orthoses:  (LLE secondary to previous CVA) Restrictions Weight Bearing Restrictions: No   Pain: Pain Assessment Pain Score: 0-No pain ADL: ADL Eating: Independent Where Assessed-Eating: Other (comment) Grooming: Supervision/safety Where Assessed-Grooming: Standing at sink Upper Body Bathing: Setup Where Assessed-Upper Body Bathing: Shower Lower Body Bathing: Supervision/safety Where Assessed-Lower Body Bathing: Shower Upper Body Dressing: Independent Where Assessed-Upper Body Dressing: Edge of bed Lower Body Dressing: Contact guard Where Assessed-Lower Body Dressing: Edge of bed, Other (Comment) (standing EOB) Toileting: Contact guard Where Assessed-Toileting: Teacher, adult education: Furniture conservator/restorer Method: Proofreader: Engineer, technical sales: Minimal assistance (based on observation of the pt transferring from bed LOF to the w/c) Tub/Shower Transfer Method: Stand pivot, Event organiser: Administrator, arts Method: Ambulating, Stand pivot [employer]: Grab bars, Transfer tub bench   Therapy/Group: Individual Therapy  Coumba Kellison 09/18/2022, 12:32 PM

## 2022-09-19 DIAGNOSIS — I63549 Cerebral infarction due to unspecified occlusion or stenosis of unspecified cerebellar artery: Secondary | ICD-10-CM | POA: Diagnosis not present

## 2022-09-19 MED ORDER — DOCUSATE SODIUM 100 MG PO CAPS
100.0000 mg | ORAL_CAPSULE | Freq: Two times a day (BID) | ORAL | Status: DC
  Administered 2022-09-19 – 2022-09-20 (×2): 100 mg via ORAL
  Filled 2022-09-19 (×3): qty 1

## 2022-09-19 NOTE — Progress Notes (Signed)
Occupational Therapy Weekly Progress Note  Patient Details  Name: EMOGENE SCHMUHL MRN: [id number] Date of Birth: January 30, 1945  Beginning of progress report period: September 13, 2022 End of progress report period: September 19, 2022  Today's Date: 09/19/2022 OT Individual Time: 4098-1191 OT Individual Time Calculation (min): 60 min    Patient has met 1 of 5 short term goals.  Pt is progressing with all goals but is not yet mod I with STGs that were set.   Patient continues to demonstrate the following deficits: unbalanced muscle activation, ataxia, and decreased coordination and decreased sitting balance, decreased standing balance, decreased postural control, hemiplegia, and decreased balance strategies and therefore will continue to benefit from skilled OT intervention to enhance overall performance with BADL.  Patient progressing toward long term goals..  Continue plan of care.  LTGs of shower transfer adjusted from Mod I to supervision due to balance safety concerns.   OT Short Term Goals Week 1:  OT Short Term Goal 1 (Week 1): The pt will complete all functional transfer with ModI  at 95% safe OT Short Term Goal 1 - Progress (Week 1): Progressing toward goal OT Short Term Goal 2 (Week 1): The pt will improve UB strength to 4/5 MMT at 95% safe OT Short Term Goal 2 - Progress (Week 1): Progressing toward goal OT Short Term Goal 3 (Week 1): The pt will donn and doff LB garments with ModI at 95% safe OT Short Term Goal 3 - Progress (Week 1): Progressing toward goal OT Short Term Goal 4 (Week 1): The pt will complete sit to stands at ModI at 95% safe OT Short Term Goal 4 - Progress (Week 1): Progressing toward goal OT Short Term Goal 5 (Week 1): The pt will incorporate relaxation breathing during functional task performance , to reduce the incidence of tremor, for favorable outcome after with initial cue 4 of 5 attempts. OT Short Term Goal 5 - Progress (Week 1): Met Week 2:  OT Short Term Goal  1 (Week 2): STGs = LTGs  Skilled Therapeutic Interventions/Progress Updates:    Pt received in bed ready for therapy.  Focus of therapy session on safe balance and fall precaution strategies as she completes ADLs. Pt completed shower and dressing - see ADL documentation below.   Pt did need 1 cue in shower for L foot placement to ensure safe sit to stand. She continues to need CGA in standing and ambulation activities.   Discussed need for pt to work on her reaction time when she does loose her balance. When her balance shifts she tries to recover by grabbing the walker but she continues to lean and falls and then needs CGA to recover. Discussed and practiced alternative strategy of reaching back to sit right away.   She stated she likes to get dressed in recliner at home. Pt ambulated to ADL apt to practice several sit to stand from recliner to RW and then practice with reaching to knees to simulate getting dressed.  When pt "lost" her balance which is usually to the left or posterior she was able to successfully reach back and sit safely with therapist closely guarding her. Pt practiced this technique 5x.  She agreed this was a safer strategy and allowed for a safe recovery if she should loose her balance while dressing. In the shower or at the toilet she has a grab bar she can use.  Pt transferred to w/c and returned to room. NT in room changing her bed  linens. Pt sitting in chair to wait to sit EOB.        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 with pt's personal AFO in room Required Braces or Orthoses:  (LLE secondary to previous CVA) Restrictions Weight Bearing Restrictions: No Pain: Pain Assessment Pain Scale: 0-10 Pain Score: 0-No pain ADL: ADL Eating: Independent Where Assessed-Eating: Edge of bed Grooming: Supervision/safety Where Assessed-Grooming: Standing at sink Upper Body Bathing:  Setup Where Assessed-Upper Body Bathing: Shower Lower Body Bathing: Supervision/safety Where Assessed-Lower Body Bathing: Shower Upper Body Dressing: Independent Where Assessed-Upper Body Dressing: Edge of bed Lower Body Dressing: Contact guard Where Assessed-Lower Body Dressing: Edge of bed, Other (Comment) (standing EOB) Toileting: Contact guard Where Assessed-Toileting: Teacher, adult education: Furniture conservator/restorer Method: Proofreader: Engineer, technical sales: Minimal assistance (based on observation of the pt transferring from bed LOF to the w/c) Tub/Shower Transfer Method: Stand pivot, Event organiser: Administrator, arts Method: Ambulating, Stand pivot [employer]: Grab bars, Transfer tub bench     Therapy/Group: Individual Therapy  Davaughn Hillyard 09/19/2022, 12:26 PM

## 2022-09-19 NOTE — Discharge Summary (Signed)
Physician Discharge Summary  Patient ID: Susan Farrell MRN: [id number] DOB/AGE: Apr 07, 1945 77 y.o.  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 Exam Location:  [facility] Procedure: 2D Echo, Cardiac Doppler, Color Doppler and Saline Contrast Bubble            Study Indications:     Stroke I63.9  History:         Patient has prior history of Echocardiogram examinations, most                  recent 08/29/2021. Stroke; Risk Factors:Hypertension.  Sonographer:     Cristela Blue Referring Phys:  [id number] Gordy Councilman Diagnosing Phys: Julien Nordmann MD IMPRESSIONS  1. Left ventricular ejection fraction, by estimation, is 60 to 65%. The left ventricle has normal function. The left ventricle has no regional wall motion abnormalities. There is moderate left ventricular hypertrophy. Left ventricular diastolic parameters are consistent with Grade I diastolic dysfunction (impaired relaxation).  2. Right ventricular systolic function is normal. The right ventricular size is normal. There is normal pulmonary artery systolic pressure. The estimated right ventricular systolic pressure is 20.4 mmHg.  3. The mitral valve is normal in structure. Mild mitral valve regurgitation. No evidence of mitral stenosis.  4.  The aortic valve is normal in structure. There is mild calcification of the aortic valve. Aortic valve regurgitation is not visualized. Aortic valve sclerosis is present, with no evidence of aortic valve stenosis.  5. The inferior vena cava is normal in size with greater than 50% respiratory variability, suggesting right atrial pressure of 3 mmHg.  6. Agitated saline contrast bubble study was negative, with no evidence of any interatrial shunt. FINDINGS  Left Ventricle: Left ventricular ejection fraction, by estimation, is 60 to 65%. The left ventricle has normal function. The left ventricle has no regional wall motion abnormalities. The left ventricular internal cavity size was normal in size. There is  moderate left ventricular hypertrophy. Left ventricular diastolic parameters are consistent with Grade I diastolic dysfunction (impaired relaxation). Right Ventricle: The right ventricular size is normal. No increase in right ventricular wall thickness. Right ventricular systolic function is normal. There is normal pulmonary artery systolic pressure. The tricuspid regurgitant velocity is 1.96 m/s, and  with an assumed right atrial pressure of 5 mmHg, the estimated right ventricular systolic pressure is 20.4 mmHg. Left Atrium: Left atrial size was normal in size. Right Atrium: Right atrial  size was normal in size. Pericardium: There is no evidence of pericardial effusion. Mitral Valve: The mitral valve is normal in structure. There is mild calcification of the mitral valve leaflet(s). Mild mitral valve regurgitation. No evidence of mitral valve stenosis. MV peak gradient, 6.2 mmHg. The mean mitral valve gradient is 2.0 mmHg. Tricuspid Valve: The tricuspid valve is normal in structure. Tricuspid valve regurgitation is mild . No evidence of tricuspid stenosis. Aortic Valve: The aortic valve is normal in structure. There is mild calcification of the aortic valve. Aortic valve regurgitation is not visualized. Aortic  valve sclerosis is present, with no evidence of aortic valve stenosis. Aortic valve mean gradient  measures 5.5 mmHg. Aortic valve peak gradient measures 9.9 mmHg. Aortic valve area, by VTI measures 2.60 cm. Pulmonic Valve: The pulmonic valve was normal in structure. Pulmonic valve regurgitation is not visualized. No evidence of pulmonic stenosis. Aorta: The aortic root is normal in size and structure. Venous: The inferior vena cava is normal in size with greater than 50% respiratory variability, suggesting right atrial pressure of 3 mmHg. IAS/Shunts: No atrial level shunt detected by color flow Doppler. Agitated saline contrast was given intravenously to evaluate for intracardiac shunting. Agitated saline contrast bubble study was negative, with no evidence of any interatrial shunt. There  is no evidence of a patent foramen ovale. There is no evidence of an atrial septal defect.  LEFT VENTRICLE PLAX 2D LVIDd:         3.90 cm   Diastology LVIDs:         2.50 cm   LV e' medial:    5.98 cm/s LV PW:         1.30 cm   LV E/e' medial:  14.4 LV IVS:        1.70 cm   LV e' lateral:   8.92 cm/s LVOT diam:     2.00 cm   LV E/e' lateral: 9.6 LV SV:         82 LV SV Index:   44 LVOT Area:     3.14 cm  RIGHT VENTRICLE RV Basal diam:  3.70 cm RV Mid diam:    2.40 cm RV S prime:     15.80 cm/s TAPSE (M-mode): 1.8 cm LEFT ATRIUM             Index        RIGHT ATRIUM           Index LA diam:        3.00 cm 1.61 cm/m   RA Area:     19.30 cm LA Vol (A2C):   33.9 ml 18.16 ml/m  RA Volume:   55.20 ml  29.58 ml/m LA Vol (A4C):   53.8 ml 28.83 ml/m LA Biplane Vol: 44.2 ml 23.68 ml/m  AORTIC VALVE AV Area (Vmax):    2.21 cm AV Area (Vmean):   2.19 cm AV Area (VTI):     2.60 cm AV Vmax:           157.50 cm/s AV Vmean:          108.650 cm/s AV VTI:            0.317 m AV Peak Grad:      9.9 mmHg AV Mean Grad:      5.5 mmHg LVOT Vmax:         111.00 cm/s LVOT Vmean:        75.600 cm/s LVOT VTI:  0.262 m LVOT/AV VTI ratio:  0.83  AORTA Ao Root diam: 3.10 cm MITRAL VALVE                TRICUSPID VALVE MV Area (PHT): 2.59 cm     TR Peak grad:   15.4 mmHg MV Area VTI:   2.79 cm     TR Vmax:        196.00 cm/s MV Peak grad:  6.2 mmHg MV Mean grad:  2.0 mmHg     SHUNTS MV Vmax:       1.25 m/s     Systemic VTI:  0.26 m MV Vmean:      66.5 cm/s    Systemic Diam: 2.00 cm MV Decel Time: 293 msec MV E velocity: 85.90 cm/s MV A velocity: 116.00 cm/s MV E/A ratio:  0.74 Julien Nordmann MD Electronically signed by Julien Nordmann MD Signature Date/Time: 09/10/2022/12:01:02 PM    Final    CT Angio Head Neck W WO CM  Result Date: 09/09/2022 CLINICAL DATA:  Central vertigo, emesis EXAM: CT ANGIOGRAPHY HEAD AND NECK WITH AND WITHOUT CONTRAST TECHNIQUE: Multidetector CT imaging of the head and neck was performed using the standard protocol during bolus administration of intravenous contrast. Multiplanar CT image reconstructions and MIPs were obtained to evaluate the vascular anatomy. Carotid stenosis measurements (when applicable) are obtained utilizing NASCET criteria, using the distal internal carotid diameter as the denominator. RADIATION DOSE REDUCTION: This exam was performed according to the departmental dose-optimization program which includes automated exposure control, adjustment of the mA and/or kV according to patient size and/or use of iterative reconstruction technique. CONTRAST:  75mL OMNIPAQUE IOHEXOL 350 MG/ML SOLN COMPARISON:  11/09/2021 CT head, 08/28/2021 CTA head and neck, 09/09/2022 MRI head FINDINGS: CT HEAD FINDINGS Brain: Hypodensity in the superior right cerebellum and vermis, consistent with the acute right SCA territory infarct seen on the same-day MRI. No evidence of acute hemorrhage, mass, mass effect, or midline shift. No hydrocephalus or extra-axial fluid collection. Sequela of remote right basal ganglia infarct. Vascular: No hyperdense vessel. Skull: Negative for fracture or focal lesion. Sinuses/Orbits: No acute  finding. Other: The mastoid air cells are well aerated. CTA NECK FINDINGS Aortic arch: Standard branching. Imaged portion shows no evidence of aneurysm or dissection. No significant stenosis of the major arch vessel origins. Right carotid system: No evidence of dissection, occlusion, or hemodynamically significant stenosis (greater than 50%). Left carotid system: No evidence of dissection, occlusion, or hemodynamically significant stenosis (greater than 50%). Vertebral arteries: No evidence of dissection, occlusion, or hemodynamically significant stenosis (greater than 50%). Skeleton: No acute osseous abnormality. Degenerative changes in the cervical spine. Other neck: Negative. Upper chest: No focal pulmonary opacity or pleural effusion. Review of the MIP images confirms the above findings CTA HEAD FINDINGS Evaluation is somewhat limited by bolus timing and motion. This particularly affects the distal anterior circulation. Anterior circulation: Both internal carotid arteries are patent to the termini, without significant stenosis. A1 segments patent. Normal anterior communicating artery. Anterior cerebral arteries are patent through the A2 segments, with more distal segments limited by motion. No M1 stenosis or occlusion. MCA branches perfused through the M2 segments, with more distal segments limited by motion. Posterior circulation: Vertebral arteries patent to the vertebrobasilar junction without significant stenosis. Posterior inferior cerebellar arteries patent proximally. Basilar patent to its distal aspect without significant stenosis. Superior cerebellar arteries patent proximally, although the right proximal SCA is decreased in caliber compared to the prior exam (series 10, image 126) Patent P1  segments. Moderate to severe stenosis in the proximal right P3 (series 10, image 114 and series 11, image 24). PCAs perfused to their distal aspects without significant stenosis. The bilateral posterior  communicating arteries are not visualized. Venous sinuses: As permitted by contrast timing and motion, patent. Anatomic variants: None significant. Review of the MIP images confirms the above findings IMPRESSION: 1. Hypodensity in the superior right cerebellum and vermis, consistent with the acute right SCA territory infarct seen on the same-day MRI. 2. Decreased caliber of proximal right SCA compared to 08/28/2021, although the vessel does appear patent proximally. 3. No intracranial large vessel occlusion. 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 4. No hemodynamically significant stenosis in the neck. These results were called by telephone at the time of interpretation on 09/09/2022 at 1:06 am to provider [facility] , who verbally acknowledged these results. Electronically Signed   By: Wiliam Ke M.D.   On: 09/09/2022 01:17   MR BRAIN WO CONTRAST  Result Date: 09/09/2022 CLINICAL DATA:  Dizziness, stroke suspected, vomiting EXAM: MRI HEAD WITHOUT CONTRAST TECHNIQUE: Multiplanar, multiecho pulse sequences of the brain and surrounding structures were obtained without intravenous contrast. COMPARISON:  08/29/2021 MRI head, correlation is also made with 11/09/2021 CT head FINDINGS: Brain: Restricted diffusion with ADC correlate in the right superior cerebellum and vermis, which correlates the right superior cerebellar artery territory. This area is associated with increased T2 hyperintense signal, likely cytotoxic edema. No evidence of hemorrhagic transformation. No acute hemorrhage, mass, mass effect, or midline shift. No hydrocephalus or extra-axial collection. Partial empty sella. Normal craniocervical junction. No hemosiderin deposition to suggest remote hemorrhage. Remote infarct in the right basal ganglia. Somewhat disproportionate volume loss in the posterior left frontal lobe (series 15, image 39). Scattered T2 hyperintense signal in the periventricular white matter, likely the sequela of  chronic small vessel ischemic disease. Vascular: Normal arterial flow voids. Skull and upper cervical spine: Normal marrow signal. Sinuses/Orbits: Clear paranasal sinuses. No acute finding in the orbits. Other: The mastoid air cells are well aerated. IMPRESSION: Acute infarct in the right superior cerebellum and vermis, which correlates with the right superior cerebellar artery territory. No evidence of hemorrhagic transformation. These results were called by telephone at the time of interpretation on 09/09/2022 at 1:06 am to provider [facility] , who verbally acknowledged these results. Electronically Signed   By: Wiliam Ke M.D.   On: 09/09/2022 01:07   DG Chest 2 View  Result Date: 09/08/2022 CLINICAL DATA:  Dizziness EXAM: CHEST - 2 VIEW COMPARISON:  08/28/2021 FINDINGS: Cardiomegaly. Mediastinal contours within normal limits. Linear left lower lung opacities, favor atelectasis. Right lung clear. No effusions or edema. No acute bony abnormality. IMPRESSION: Cardiomegaly. Linear left lower lung opacities, favor atelectasis. Electronically Signed   By: Charlett Nose M.D.   On: 09/08/2022 22:22    Labs:  Basic Metabolic Panel: Recent Labs  Lab 09/15/22 0706  NA 137  K 4.3  CL 105  CO2 24  GLUCOSE 118*  BUN 9  CREATININE 0.76  CALCIUM 9.0    CBC: Recent Labs  Lab 09/15/22 0706  WBC 3.1*  NEUTROABS 1.4*  HGB 9.9*  HCT 31.5*  MCV 75.5*  PLT 197    CBG: No results for input(s): "GLUCAP" in the last 168 hours.  Family history.  Mother with hypertension as well as father.  Sister with diabetes.  Denies any breast cancer colon cancer or rectal cancer  Brief HPI:   Susan Farrell is a  77 y.o. right-handed female with history of hypertension discoid lupus hypothyroidism, AAA status post rupture and repair, tobacco use right basal ganglia infarction with residual mild left side weakness receiving CIR 09/02/2021 - 09/13/2021 discharged home ambulating contact-guard assist.  Per chart  review lives with spouse.  Independent prior to admission and driving.  Presented to [facility] 09/08/2022 with acute onset of dizziness nausea vomiting while eating dinner.  MRI showed acute infarct in the right superior cerebellum and vermis.  No evidence of hemorrhagic transformation.  CTA head and neck no intracranial large vessel occlusion.  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  No hemodynamically significant stenosis in the neck.  Admission chemistries unremarkable except hemoglobin 10.1 troponin negative hemoglobin A1c 6.3.  Echocardiogram with ejection fraction of 60 to 65% no wall motion abnormalities grade 1 diastolic dysfunction.  Neurology follow-up with aspirin 81 mg daily and Plavix 75 mg daily x 3 months then aspirin alone.  Lovenox added for DVT prophylaxis.  Therapy evaluations completed due to patient decreased functional mobility was admitted for a comprehensive rehab program.   Hospital Course: Susan Farrell was admitted to rehab 09/12/2022 for inpatient therapies to consist of PT, ST and OT at least three hours five days a week. Past admission physiatrist, therapy team and rehab RN have worked together to provide customized collaborative inpatient rehab.  Pertaining to patient's right SCA territory infarction with right hemiataxia as well as history of right basal ganglia infarction 2023 with residual left-sided weakness.  She would remain on low-dose aspirin 81 mg daily and Plavix 75 mg daily x 90 days then aspirin alone with follow-up per neurology services.  She continued on Lovenox during her hospital stay for DVT prophylaxis no bleeding episodes.  Permissive hypertension she was on her Norvasc and will continue to follow as well as Toprol XL.  Synthroid ongoing for hypothyroidism.  Crestor for hyperlipidemia.  History of microcytic anemia and currently on Niferex.  Discoid lupus no acute issues patient uses topical medications only if needed prior to admission and follow-up  outpatient.  History of tobacco use Chantix as directed providing counsel regarding cessation of nicotine products.   Blood pressures were monitored on TID basis and controlled   Rehab course: During patient's stay in rehab weekly team conferences were held to monitor patient's progress, set goals and discuss barriers to discharge. At admission, patient required contact-guard sit to stand ambulating max assist 15 feet rolling walker  Physical exam.  Blood pressure 125/71 pulse 84 temperature 99 respirations 18 oxygen saturation is 98% room air Constitutional.  No acute distress HEENT Head.  Normocephalic and atraumatic Eyes.  Pupils round and reactive to light no discharge without nystagmus Neck.  Supple nontender no JVD without thyromegaly Cardiac regular rate and rhythm without any extra sounds or murmur heard Abdomen.  Soft nontender positive bowel sounds without rebound Respiratory effort normal no respiratory distress without wheeze Skin.  Multiple scaling patches from discoid lupus on chest arms neck legs Neurologic.  Alert and oriented Cranial nerves II through XII intact Right upper extremity right lower extremity 5/5 throughout Left upper extremity 5 -/5 throughout Left lower extremity 2- out of 5 hip flexion 3+ out of 5 knee extension dorsi plantarflexion  He/She  has had improvement in activity tolerance, balance, postural control as well as ability to compensate for deficits. He/She has had improvement in functional use RUE/LUE  and RLE/LLE as well as improvement in awareness.  Sitting edge of bed supervision.  Patient  donned bilateral personal shoes and left AFO with supervision.  Ambulates 50 feet close supervision verbal cues.  She ambulates to the shower rolling walker contact-guard.  Once in shower bathe with supervision.  Able to dress with contact-guard with standing pulling pants over hips.  Full family teaching completed plan discharge to home       Disposition:  Discharge to home    Diet: Regular  Special Instructions: No driving smoking or alcohol  Plan aspirin 81 mg daily and Plavix 75 mg daily x 3 months total then aspirin alone at the discretion of neurology services  Medications at discharge 1.  Tylenol as needed 2.  Norvasc 2.5 mg p.o. daily 3.  Aspirin 81 mg p.o. daily 4.  Os-Cal 1 tablet daily 5.  Temovate cream 0.05% twice daily to affected area 6.  Plavix 75 mg p.o. daily 7.  Colace 100 mg p.o. daily 8.  Niferex 150 mg p.o. daily 9.  Synthroid 100 mcg p.o. daily 10.  Toprol-XL 50 mg p.o. daily 11.  Multivitamin daily 12.  Crestor 40 mg p.o. daily 13.  Chantix 1 mg p.o. twice daily  30-35 minutes were spent completing discharge summary and discharge planning  Discharge Instructions     Ambulatory referral to Neurology   Complete by: As directed    An appointment is requested in approximately: 4 weeks right MCA infarction with history of right basal ganglia infarction   Ambulatory referral to Occupational Therapy   Complete by: As directed    Eval and treat   Ambulatory referral to Physical Medicine Rehab   Complete by: As directed    Moderate complexity follow-up 1 to 2 weeks right SCA territory infarction   Ambulatory referral to Physical Therapy   Complete by: As directed    Eval and treat        Follow-up Information     Elijah Birk C, DO Follow up.   Specialty: Physical Medicine and Rehabilitation Why: Office to call for appointment Contact information: [address] [place] [address] [phone]                 Signed: Charlton Amor 09/19/2022, 7:16 AM

## 2022-09-19 NOTE — Progress Notes (Addendum)
Physical Therapy Session Note  Patient Details  Name: Susan Farrell MRN: [id number] Date of Birth: [birth year]  Today's Date: 09/19/2022 PT Individual Time: 1035-1130; 1422 - 1502 PT Individual Time Calculation (min): 55 min; 40 min  Short Term Goals: Week 1:  PT Short Term Goal 1 (Week 1): Pt will participate in standardized outcome measure to assess balance and falls risk PT Short Term Goal 2 (Week 1): Pt will perform sit<>stands using LRAD with min A PT Short Term Goal 3 (Week 1): Pt will perform bed<>chair transfers using LRAD with min A PT Short Term Goal 4 (Week 1): Pt will ambulate at least 1108ft using LRAD with min A PT Short Term Goal 5 (Week 1): Pt will navigate 4 stairs using HRs per home set-up with min A  SESSION 1 Skilled Therapeutic Interventions/Progress Updates: Patient supine in bed on entrance to room. Patient alert and agreeable to PT session.   Patient reported no pain or nausea this morning, and reported no emetic event since PT session yesterday (pt reported that nsg provides pt with something to eat when providing medication).  Therapeutic Activity: Bed Mobility: Pt performed supine<>sit on EOB with supervision/modI. No VC required.  Transfers: Pt performed sit<>stand transfers throughout session with RW with supervision for safety. Pt initiated reach back to surface for controlled descent. Pt stand pivot without AD from [facility] to nustep with CGA and cues to avoid excessively leaning forward to reach for arm rest.   Gait Training:  Pt ambulated roughly 9' using RW with close supervision for safety. Pt cued to increase hip flexion on L LE in order to increase step clearance vs catching to on ground or slightly circumducting.   Neuromuscular Re-ed: NMR facilitated during session with focus on proprioceptive feedback on B LE's and coordination. - NuStep for 10 minutes on lvl 5 with cues to maintain steps per minute at 30. Pt required multiple short rest breaks. Pt  then performed 2 x 10 bilaterally with lvl set to 10. Pt with no use of B UE's throughout entire nustep intervention. Pt performed 389 steps. - Pt standing with R HHA and cues to step to called out dots on floor with R LE. Pt performed until fatigue and required min/light modA to maintain dynamic standing balance. Pt with VC to step on dot with toe of foot for added challenge to balance. Pt also cued to maintain L knee in extension vs slight flexion to increase quadriceps strength and control in stance.   NMR performed for improvements in motor control and coordination, balance, sequencing, judgement, and self confidence/ efficacy in performing all aspects of mobility at highest level of independence.   Patient sitting EOB at end of session with brakes locked, bed alarm set, and all needs within reach.  SESSION 2 Skilled Therapeutic Interventions/Progress Updates: Patient supine in bed on entrance to room. Patient alert and agreeable to PT session.   Patient reported no change in status since previous session. Pt also reported not eating lunch and that she just didn't want to eat.   Therapeutic Activity: Bed Mobility: Pt performed supine<>sit on EOB with supervision/modI. No VC required.  Transfers: Pt performed sit<>stand transfers throughout session with CGA/supervision for safety. Provided VC for pt to reach back to surface to control descent.  Neuromuscular Re-ed: NMR facilitated during session with focus on dynamic balance, stepping strategies, and musculature coordination. Pt with max cues throughout to avoid reaching for RW or movable table for stability. Pt educated on reasoning behind  interventions as it translates into pt's personal goal of walking again with less assistance.  - Pt standing with R HHA and cues to step to called out dots on floor with R LE. Pt performed until fatigue and required min/light modA to maintain dynamic standing balance. Pt with VC to maintain neutral BOS by  standing on X target tape on floor, and then returning to same spot after tapping dot. Pt also cued to maintain L knee in extension vs slight flexion to increase quadriceps strength and control in stance (pt noted to have decrease in quad stability when stepping with contralateral extremity as the stance LE would shake). Pt then performed same task with stepping with L LE, this time with closer to modA, and greater decrease in quad stability with max cues to maintain in extension while stepping and once returned to BOS. Pt noted to have genu valgus during (PTA used yellow theraband at the end of this intervention with pt in standing. Pt cued to remain in neutral BOS while opening knees. Pt noted to only be able to move R LE into external rotation, but little movement available in L LE. - Bean bag toss to cones on floor. Pt instructed to stand on targeted X on floor for neutral BOS, reach with R UE past midline to table on L for the bags, and to step with R LE to toss bean bag with R UE, and then to step back to neutral BOS. Trial of stepping over bar on floor to increase step clearance proved to be difficult (especially picking extremity up to pull back to neutral BOS), so pt only to step anteriorly, focusing on the balance and coordination aspect of this intervention. Pt with modA throughout (PTA on L for HHA). Pt cued to move R UE in a count of 3 prior to tossing bag in order to focus on coordinating required speed and direction to knock down cones.   NMR performed for improvements in motor control and coordination, balance, sequencing, judgement, and self confidence/ efficacy in performing all aspects of mobility at highest level of independence.   Patient transported back to room in [facility] and reported needing to use the restroom at end of session and was left on toilet in room with NT present.       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

## 2022-09-19 NOTE — Plan of Care (Signed)
  Problem: Consults Goal: RH STROKE PATIENT EDUCATION Description: See Patient Education module for education specifics  Outcome: Progressing   Problem: RH BOWEL ELIMINATION Goal: RH STG MANAGE BOWEL WITH ASSISTANCE Description: STG Manage Bowel with toileting Assistance. Outcome: Progressing Goal: RH STG MANAGE BOWEL W/MEDICATION W/ASSISTANCE Description: STG Manage Bowel with Medication with mod I Assistance. Outcome: Progressing   Problem: RH SAFETY Goal: RH STG ADHERE TO SAFETY PRECAUTIONS W/ASSISTANCE/DEVICE Description: STG Adhere to Safety Precautions With cues Assistance/Device. Outcome: Progressing   Problem: RH PAIN MANAGEMENT Goal: RH STG PAIN MANAGED AT OR BELOW PT'S PAIN GOAL Description: < 4 with prns Outcome: Progressing   Problem: RH KNOWLEDGE DEFICIT Goal: RH STG INCREASE KNOWLEDGE OF DIABETES Description: Patient and spouse will be able to manage prediabetes with dietary modification using educational resources independently Outcome: Progressing Goal: RH STG INCREASE KNOWLEDGE OF HYPERTENSION Description: Patient and spouse will be able to manage HTN with medications and dietary modification using educational resources independently Outcome: Progressing Goal: RH STG INCREASE KNOWLEGDE OF HYPERLIPIDEMIA Description: Patient and spouse will be able to manage HLD with medications and dietary modification using educational resources independently Outcome: Progressing Goal: RH STG INCREASE KNOWLEDGE OF STROKE PROPHYLAXIS Description: Patient and spouse will be able to manage secondary risks with medications and dietary modification using educational resources independently Outcome: Progressing   Problem: Education: Goal: Knowledge of disease or condition will improve Outcome: Progressing Goal: Knowledge of secondary prevention will improve (MUST DOCUMENT ALL) Outcome: Progressing Goal: Knowledge of patient specific risk factors will improve Loraine Leriche N/A or DELETE  if not current risk factor) Outcome: Progressing   Problem: Ischemic Stroke/TIA Tissue Perfusion: Goal: Complications of ischemic stroke/TIA will be minimized Outcome: Progressing   Problem: Coping: Goal: Will verbalize positive feelings about self Outcome: Progressing Goal: Will identify appropriate support needs Outcome: Progressing   Problem: Health Behavior/Discharge Planning: Goal: Ability to manage health-related needs will improve Outcome: Progressing Goal: Goals will be collaboratively established with patient/family Outcome: Progressing   Problem: Self-Care: Goal: Ability to participate in self-care as condition permits will improve Outcome: Progressing Goal: Verbalization of feelings and concerns over difficulty with self-care will improve Outcome: Progressing Goal: Ability to communicate needs accurately will improve Outcome: Progressing   Problem: Nutrition: Goal: Risk of aspiration will decrease Outcome: Progressing Goal: Dietary intake will improve Outcome: Progressing

## 2022-09-19 NOTE — Plan of Care (Signed)
  Problem: RH Tub/Shower Transfers Goal: LTG Patient will perform tub/shower transfers w/assist (OT) Description: LTG: Patient will perform tub/shower transfers with assist, with/without cues using equipment (OT) Flowsheets (Taken 09/19/2022 0846) LTG: Pt will perform tub/shower stall transfers with assistance level of: (LTG adjusted as pt will need Supervision for shower stall transfers.) Supervision/Verbal cueing Note: LTG adjusted as pt will need Supervision for shower stall transfers.

## 2022-09-19 NOTE — Progress Notes (Signed)
PROGRESS NOTE   Subjective/Complaints:  Having occ vomiting  , no abd discomfort  ROS: Patient denies CP, SOB, abd pain    Objective:   No results found. No results for input(s): "WBC", "HGB", "HCT", "PLT" in the last 72 hours.  No results for input(s): "NA", "K", "CL", "CO2", "GLUCOSE", "BUN", "CREATININE", "CALCIUM" in the last 72 hours.   Intake/Output Summary (Last 24 hours) at 09/19/2022 0735 Last data filed at 09/18/2022 1700 Gross per 24 hour  Intake 100 ml  Output --  Net 100 ml        Physical Exam: Vital Signs Blood pressure 131/62, pulse 72, temperature 98.3 F (36.8 C), resp. rate 18, height 5\' 6"  (1.676 m), weight 45.2 kg, SpO2 98%.   General: No acute distress Mood and affect are appropriate Heart: Regular rate and rhythm no rubs murmurs or extra sounds Lungs: Clear to auscultation, breathing unlabored, no rales or wheezes Abdomen: Positive bowel sounds, soft nontender to palpation, nondistended Extremities: No clubbing, cyanosis, or edema Skin: No evidence of breakdown, no evidence of rash  Cerebellar exam + dysmetria Right FNF and Heel shin, normal left FNF, Left  H->shin limited by weakness  Musculoskeletal: Full range of motion in all 4 extremities. No joint swelling  Neuro:  Alert and oriented x 3. Normal insight and awareness. Intact Memory. Sl word finding deficits but easily able to communicate, speech was clear. Cranial nerve exam unremarkable. MMT: RUE and RLE 5/5. LUE 4+/5, LLE 3- to 3+/5 prox to distal.  Good sitting balance Musculoskeletal: Full ROM, No pain with AROM or PROM in the neck, trunk, or extremities.   Assessment/Plan: 1. Functional deficits which require 3+ hours per day of interdisciplinary therapy in a comprehensive inpatient rehab setting. Physiatrist is providing close team supervision and 24 hour management of active medical problems listed below. Physiatrist and rehab  team continue to assess barriers to discharge/monitor patient progress toward functional and medical goals  Care Tool:  Bathing    Body parts bathed by patient: Right arm, Left arm, Chest, Abdomen, Front perineal area, Buttocks, Face, Right upper leg, Left upper leg   Body parts bathed by helper: Right lower leg, Left lower leg     Bathing assist Assist Level: Minimal Assistance - Patient > 75%     Upper Body Dressing/Undressing Upper body dressing   What is the patient wearing?: Pull over shirt    Upper body assist Assist Level: Set up assist    Lower Body Dressing/Undressing Lower body dressing      What is the patient wearing?: Pants, Underwear/pull up     Lower body assist Assist for lower body dressing: Minimal Assistance - Patient > 75%     Toileting Toileting    Toileting assist Assist for toileting: Moderate Assistance - Patient 50 - 74%     Transfers Chair/bed transfer  Transfers assist     Chair/bed transfer assist level: Minimal Assistance - Patient > 75%     Locomotion Ambulation   Ambulation assist   Ambulation activity did not occur: Safety/medical concerns (requires use of RW for safe ambulation, pt used SPC at baseline)  Assist level: Moderate Assistance - Patient 50 -  74% Assistive device: Walker-rolling Max distance: 46ft   Walk 10 feet activity   Assist     Assist level: Moderate Assistance - Patient - 50 - 74% Assistive device: Walker-rolling   Walk 50 feet activity   Assist Walk 50 feet with 2 turns activity did not occur: Safety/medical concerns         Walk 150 feet activity   Assist Walk 150 feet activity did not occur: Safety/medical concerns         Walk 10 feet on uneven surface  activity   Assist Walk 10 feet on uneven surfaces activity did not occur: Safety/medical concerns         Wheelchair     Assist Is the patient using a wheelchair?: Yes Type of Wheelchair: Manual    Wheelchair  assist level: Minimal Assistance - Patient > 75%, Moderate Assistance - Patient 50 - 74% Max wheelchair distance: 45ft with Min/ModA for maintaining safety    Wheelchair 50 feet with 2 turns activity    Assist        Assist Level: Dependent - Patient 0%   Wheelchair 150 feet activity     Assist      Assist Level: Dependent - Patient 0%   Blood pressure 131/62, pulse 72, temperature 98.3 F (36.8 C), resp. rate 18, height 5\' 6"  (1.676 m), weight 45.2 kg, SpO2 98%.  Medical Problem List and Plan: 1. 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  Antithrombotics: -DVT/anticoagulation:  Pharmaceutical: Lovenox             -antiplatelet therapy: Aspirin 81 mg daily and Plavix 75 mg day x 90 days then aspirin alone 3. Pain Management: Tylenol as needed 4. Mood/Behavior/Sleep: Provide emotional support             -antipsychotic agents: N/A 5. Neuropsych/cognition: This patient is capable of making decisions on her own behalf. 6. Skin/Wound Care: Routine skin checks 7. Fluids/Electrolytes/Nutrition:  recent labs reviewed and stable 8.  Permissive hypertension.    Patient on Norvasc 2.5 mg daily, Toprol-XL 50 mg daily prior to admission and has been resumed    8/27 bp well controlled 9.  Hypothyroidism.  Continue Synthroid 10.  Hyperlipidemia.  Continue Crestor 11.  Tobacco use.  Chantix.  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  Patient use topical medications only if needed prior to admission.  Clobetasol cream bid currently   -otherwise keep affected areas clean  13. Microcytic Anemia: Hgb reviewed and is stable, monitor weekly. Iron panel from April reviewed and is within normal  limits  14. Screening for vitamin D deficiency: add on vitamin D level today  15. Underweight: BMI reviewed and is 16.08- continue to monitor and provide dietary education  -pt is eating 70-90% of meals.  -offer protein shakes to boost albumin  16.  COnstipation , hard stools due to Fe supp increased colace to BID LOS: 7 days A FACE TO FACE EVALUATION WAS PERFORMED  Erick Colace 09/19/2022, 7:35 AM

## 2022-09-19 NOTE — Progress Notes (Signed)
Occupational Therapy Session Note  Patient Details  Name: Susan Farrell MRN: [id number] Date of Birth: [birth year]  Today's Date: 09/19/2022 OT Individual Time: 1504-1530 OT Individual Time Calculation (min): 26 min    Short Term Goals:  Week 2:  OT Short Term Goal 1 (Week 2): STGs = LTGs  Skilled Therapeutic Interventions/Progress Updates:  Pt greeted seated EOB, pt agreeable to OT intervention. Squat pivot to w/c with supervision. Total A transport into gym for time mgmt. Session focused on various therapeutic activities focused on reactive balance and LLE NMR. Pt able to stand in front of rebounder trampoline to toss basketball and catch with an emphasis on challenging reactive balance. Pt instructed to sit back on mat when pt began to lose her balance. Pt completed task with CGA with good adherence to compensatory method for maintaining her balance. Graded task up and had pt sit>stand from mat while holding ball, toss ball, catch and then bounce ball to further challenge her reactive balance. No true LOB noted with pt using her compensatory methods very well.   Pt instructed on targeted stepping task with pt able to step over cone with RLE to target with an emphasis on LLE NMR. Pt completed task with BUE support and CGA.   Ended session with pt supine in bed with bed alarm activated and all needs within reach.   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

## 2022-09-19 NOTE — Progress Notes (Signed)
Patient ID: Susan Farrell, female   DOB: 04/13/1945, 77 y.o.   MRN: [id number]  No DME/ FU recommendations currently.  Sw will continue to FU.

## 2022-09-20 DIAGNOSIS — I63549 Cerebral infarction due to unspecified occlusion or stenosis of unspecified cerebellar artery: Secondary | ICD-10-CM | POA: Diagnosis not present

## 2022-09-20 MED ORDER — POLYETHYLENE GLYCOL 3350 17 G PO PACK: 17.0000 g | PACK | Freq: Every day | ORAL | Status: DC

## 2022-09-20 MED ORDER — SENNOSIDES-DOCUSATE SODIUM 8.6-50 MG PO TABS
1.0000 | ORAL_TABLET | Freq: Every day | ORAL | Status: DC
Start: 2022-09-20 — End: 2022-09-20

## 2022-09-20 MED ORDER — DOCUSATE SODIUM 100 MG PO CAPS
100.0000 mg | ORAL_CAPSULE | Freq: Two times a day (BID) | ORAL | Status: DC
  Administered 2022-09-20 – 2022-09-25 (×10): 100 mg via ORAL
  Filled 2022-09-20 (×10): qty 1

## 2022-09-20 MED ORDER — GLYCERIN (LAXATIVE) 2 G RE SUPP
1.0000 | RECTAL | Status: DC | PRN
  Filled 2022-09-20: qty 1

## 2022-09-20 MED ORDER — VARENICLINE TARTRATE 0.5 MG PO TABS
0.5000 mg | ORAL_TABLET | Freq: Two times a day (BID) | ORAL | Status: DC
  Filled 2022-09-20 (×2): qty 1

## 2022-09-20 NOTE — Progress Notes (Signed)
Physical Therapy Weekly Progress Note  Patient Details  Name: Susan Farrell MRN: [id number] Date of Birth: 09-27-1945  Beginning of progress report period: September 13, 2022 End of progress report period: September 20, 2022  Patient has met 3 of 5 short term goals.  Pt has made great functional progress towards LTG's by demonstrating decreased assistance required during transfers as she has met STG goal of performing sit to stands with supervision, and transfers via RW with supervision/verbal cuing. Pt also has performed outcome measure of BERG (22/56) and 5 x sit to stand (43.33 seconds). Pt also is making progress towards goal of ambulating 100' with RW but is currently limited due to fatigue. Pt also has initiated stair training, but has not entirely met goal of ascending/descending 4 stairs per household set up.   Patient continues to demonstrate the following deficits muscle weakness, decreased coordination, and decreased standing balance and decreased balance strategies and therefore will continue to benefit from skilled PT intervention to increase functional independence with mobility.  Patient {LTG progression:3041653}.  {plan of WUJW:1191478}  PT Short Term Goals Week 1:  PT Short Term Goal 1 (Week 1): Pt will participate in standardized outcome measure to assess balance and falls risk PT Short Term Goal 1 - Progress (Week 1): Met PT Short Term Goal 2 (Week 1): Pt will perform sit<>stands using LRAD with min A PT Short Term Goal 2 - Progress (Week 1): Met PT Short Term Goal 3 (Week 1): Pt will perform bed<>chair transfers using LRAD with min A PT Short Term Goal 3 - Progress (Week 1): Met PT Short Term Goal 4 (Week 1): Pt will ambulate at least 110ft using LRAD with min A PT Short Term Goal 4 - Progress (Week 1): Progressing toward goal PT Short Term Goal 5 (Week 1): Pt will navigate 4 stairs using HRs per home set-up with min A PT Short Term Goal 5 - Progress (Week 1): Progressing  toward goal      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

## 2022-09-20 NOTE — Progress Notes (Addendum)
PROGRESS NOTE   Subjective/Complaints:  No events overnight. Patient complaining of ongoing severe fatigue, starting this hospitalization.  Does not have any associations with certain medications, time of day, stimuli.  Has never had fatigue like this before.    She endorses sleeping well overall, although she does state that she cannot tolerate nighttime Chantix because it makes her nauseous.  Notable Chantix was started this hospitalization, patient denies any nicotine cravings.  She is agreeable to reducing the dose and monitoring for now, may need to DC if symptoms do not resolve.    ROS: Patient denies CP, SOB, abd pain.  Positive per HPI above for nausea and fatigue.   Objective:   No results found. No results for input(s): "WBC", "HGB", "HCT", "PLT" in the last 72 hours.  No results for input(s): "NA", "K", "CL", "CO2", "GLUCOSE", "BUN", "CREATININE", "CALCIUM" in the last 72 hours.   Intake/Output Summary (Last 24 hours) at 09/20/2022 1433 Last data filed at 09/19/2022 1544 Gross per 24 hour  Intake 240 ml  Output --  Net 240 ml        Physical Exam: Vital Signs Blood pressure 129/68, pulse 67, temperature 97.8 F (36.6 C), resp. rate 16, height 5\' 6"  (1.676 m), weight 73.8 kg, SpO2 100%.   General: No acute distress.  In bed. Mood and affect are appropriate Heart: Regular rate and rhythm no rubs murmurs or extra sounds Lungs: Clear to auscultation, breathing unlabored, no rales or wheezes Abdomen: Positive bowel sounds, soft nontender to palpation, nondistended Extremities: No clubbing, cyanosis, or edema Skin: No evidence of breakdown, no evidence of rash   Musculoskeletal: Full range of motion in all 4 extremities. No joint swelling  Neuro:  Alert and oriented x 3. Normal insight and awareness. Intact Memory. Sl word finding deficits but easily able to communicate, speech was clear. Cranial nerve exam  unremarkable.  MMT: RUE and RLE 5/5. LUE 4+/5, LLE 3- to 3+/5 prox to distal.  -Moving all 4 limbs in bed and antigravity Right upper and lower extremity ataxia on finger-nose, heel-to-shin Musculoskeletal: Full ROM, No pain with AROM or PROM in the neck, trunk, or extremities.   Assessment/Plan: 1. Functional deficits which require 3+ hours per day of interdisciplinary therapy in a comprehensive inpatient rehab setting. Physiatrist is providing close team supervision and 24 hour management of active medical problems listed below. Physiatrist and rehab team continue to assess barriers to discharge/monitor patient progress toward functional and medical goals  Care Tool:  Bathing    Body parts bathed by patient: Right arm, Left arm, Chest, Abdomen, Front perineal area, Buttocks, Face, Right upper leg, Left upper leg, Right lower leg, Left lower leg   Body parts bathed by helper: Right lower leg, Left lower leg     Bathing assist Assist Level: Supervision/Verbal cueing     Upper Body Dressing/Undressing Upper body dressing   What is the patient wearing?: Pull over shirt    Upper body assist Assist Level: Independent    Lower Body Dressing/Undressing Lower body dressing      What is the patient wearing?: Pants, Underwear/pull up     Lower body assist Assist for lower body  dressing: Contact Guard/Touching assist     Toileting Toileting    Toileting assist Assist for toileting: Contact Guard/Touching assist     Transfers Chair/bed transfer  Transfers assist     Chair/bed transfer assist level: Contact Guard/Touching assist     Locomotion Ambulation   Ambulation assist   Ambulation activity did not occur: Safety/medical concerns (requires use of RW for safe ambulation, pt used SPC at baseline)  Assist level: Moderate Assistance - Patient 50 - 74% Assistive device: Walker-rolling Max distance: 21ft   Walk 10 feet activity   Assist     Assist level:  Moderate Assistance - Patient - 50 - 74% Assistive device: Walker-rolling   Walk 50 feet activity   Assist Walk 50 feet with 2 turns activity did not occur: Safety/medical concerns         Walk 150 feet activity   Assist Walk 150 feet activity did not occur: Safety/medical concerns         Walk 10 feet on uneven surface  activity   Assist Walk 10 feet on uneven surfaces activity did not occur: Safety/medical concerns         Wheelchair     Assist Is the patient using a wheelchair?: Yes Type of Wheelchair: Manual    Wheelchair assist level: Minimal Assistance - Patient > 75%, Moderate Assistance - Patient 50 - 74% Max wheelchair distance: 3ft with Min/ModA for maintaining safety    Wheelchair 50 feet with 2 turns activity    Assist        Assist Level: Dependent - Patient 0%   Wheelchair 150 feet activity     Assist      Assist Level: Dependent - Patient 0%   Blood pressure 129/68, pulse 67, temperature 97.8 F (36.6 C), resp. rate 16, height 5\' 6"  (1.676 m), weight 73.8 kg, SpO2 100%.  Medical Problem List and Plan: 1. 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  Antithrombotics: -DVT/anticoagulation:  Pharmaceutical: Lovenox             -antiplatelet therapy: Aspirin 81 mg daily and Plavix 75 mg day x 90 days then aspirin alone 3. Pain Management: Tylenol as needed 4. Mood/Behavior/Sleep: Provide emotional support             -antipsychotic agents: N/A 5. Neuropsych/cognition: This patient is capable of making decisions on her own behalf. 6. Skin/Wound Care: Routine skin checks 7. Fluids/Electrolytes/Nutrition:  recent labs reviewed and stable 8.  Permissive  hypertension.    Patient on Norvasc 2.5 mg daily, Toprol-XL 50 mg daily prior to admission and has been resumed    8/27 bp well controlled    09/20/2022    1:21 PM 09/20/2022    8:46 AM 09/20/2022    4:02 AM  Vitals with BMI  Systolic 129 128 086  Diastolic 68 74 68  Pulse 67 68 67    9.  Hypothyroidism.  Continue Synthroid 10.  Hyperlipidemia.  Continue Crestor 11.  Tobacco use.  Chantix.  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  Patient use topical medications only if needed prior to admission.  Clobetasol cream bid currently   -otherwise keep affected areas clean  13. Microcytic Anemia: Hgb reviewed and is stable, monitor weekly. Iron panel from April reviewed and is within normal limits  14. Screening for vitamin D deficiency: add on vitamin D level today  15. Underweight: BMI reviewed and is 16.08- continue to monitor and provide dietary education  -pt is eating 70-90% of meals.  -offer protein shakes to boost albumin  16.  COnstipation , hard stools due to Fe supp increased colace to BID  -8-30 large soft BM 17.  Nausea.  Reducing Chantix from 1 mg to 0.5 mg twice daily.  LOS: 8 days A FACE TO FACE EVALUATION WAS PERFORMED  Angelina Sheriff 09/20/2022, 2:33 PM

## 2022-09-20 NOTE — Plan of Care (Signed)
  Problem: Consults Goal: RH STROKE PATIENT EDUCATION Description: See Patient Education module for education specifics  Outcome: Progressing   Problem: RH BOWEL ELIMINATION Goal: RH STG MANAGE BOWEL WITH ASSISTANCE Description: STG Manage Bowel with toileting Assistance. Outcome: Progressing Goal: RH STG MANAGE BOWEL W/MEDICATION W/ASSISTANCE Description: STG Manage Bowel with Medication with mod I Assistance. Outcome: Progressing   Problem: RH SAFETY Goal: RH STG ADHERE TO SAFETY PRECAUTIONS W/ASSISTANCE/DEVICE Description: STG Adhere to Safety Precautions With cues Assistance/Device. Outcome: Progressing   Problem: RH PAIN MANAGEMENT Goal: RH STG PAIN MANAGED AT OR BELOW PT'S PAIN GOAL Description: < 4 with prns Outcome: Progressing   Problem: RH KNOWLEDGE DEFICIT Goal: RH STG INCREASE KNOWLEDGE OF DIABETES Description: Patient and spouse will be able to manage prediabetes with dietary modification using educational resources independently Outcome: Progressing Goal: RH STG INCREASE KNOWLEDGE OF HYPERTENSION Description: Patient and spouse will be able to manage HTN with medications and dietary modification using educational resources independently Outcome: Progressing Goal: RH STG INCREASE KNOWLEGDE OF HYPERLIPIDEMIA Description: Patient and spouse will be able to manage HLD with medications and dietary modification using educational resources independently Outcome: Progressing Goal: RH STG INCREASE KNOWLEDGE OF STROKE PROPHYLAXIS Description: Patient and spouse will be able to manage secondary risks with medications and dietary modification using educational resources independently Outcome: Progressing   Problem: Education: Goal: Knowledge of disease or condition will improve Outcome: Progressing Goal: Knowledge of secondary prevention will improve (MUST DOCUMENT ALL) Outcome: Progressing Goal: Knowledge of patient specific risk factors will improve Loraine Leriche N/A or DELETE  if not current risk factor) Outcome: Progressing   Problem: Ischemic Stroke/TIA Tissue Perfusion: Goal: Complications of ischemic stroke/TIA will be minimized Outcome: Progressing   Problem: Coping: Goal: Will verbalize positive feelings about self Outcome: Progressing Goal: Will identify appropriate support needs Outcome: Progressing   Problem: Health Behavior/Discharge Planning: Goal: Ability to manage health-related needs will improve Outcome: Progressing Goal: Goals will be collaboratively established with patient/family Outcome: Progressing   Problem: Self-Care: Goal: Ability to participate in self-care as condition permits will improve Outcome: Progressing Goal: Verbalization of feelings and concerns over difficulty with self-care will improve Outcome: Progressing Goal: Ability to communicate needs accurately will improve Outcome: Progressing   Problem: Nutrition: Goal: Risk of aspiration will decrease Outcome: Progressing Goal: Dietary intake will improve Outcome: Progressing

## 2022-09-21 DIAGNOSIS — I63549 Cerebral infarction due to unspecified occlusion or stenosis of unspecified cerebellar artery: Secondary | ICD-10-CM | POA: Diagnosis not present

## 2022-09-21 NOTE — Progress Notes (Signed)
PROGRESS NOTE   Subjective/Complaints:  No events overnight.  No acute complaints.  Dose reduced Chantix yesterday, patient refused it overnight due to nausea so discontinued this a.m.  Patient states that her nausea is substantially improved and her fatigue is somewhat better off of the medication.  She has no current nicotine cravings.  Vital stable.   ROS: Patient denies CP, SOB, abd pain.  Positive per HPI above for nausea and fatigue.   Objective:   No results found. No results for input(s): "WBC", "HGB", "HCT", "PLT" in the last 72 hours.  No results for input(s): "NA", "K", "CL", "CO2", "GLUCOSE", "BUN", "CREATININE", "CALCIUM" in the last 72 hours.   Intake/Output Summary (Last 24 hours) at 09/21/2022 0856 Last data filed at 09/20/2022 1700 Gross per 24 hour  Intake 236 ml  Output --  Net 236 ml        Physical Exam: Vital Signs Blood pressure 102/69, pulse 74, temperature 98.8 F (37.1 C), temperature source Oral, resp. rate 18, height 5\' 6"  (1.676 m), weight 73.8 kg, SpO2 100%.   General: No acute distress.  In bed.  Family at bedside. Mood and affect are appropriate Heart: Regular rate and rhythm no rubs murmurs or extra sounds Lungs: Clear to auscultation, breathing unlabored, no rales or wheezes Abdomen: Positive bowel sounds, soft nontender to palpation, nondistended Extremities: No clubbing, cyanosis, or edema Skin: No evidence of breakdown, no evidence of rash. + Discoid lupus plaques throughout, stable   Musculoskeletal: Full range of motion in all 4 extremities. No joint swelling  Neuro:  Alert and oriented x 3. Normal insight and awareness. Intact Memory. Sl word finding deficits but easily able to communicate, speech was clear. Cranial nerve exam unremarkable.  MMT: RUE and RLE 5/5. LUE 4+/5, LLE 3- to 3+/5 prox to distal.  -Moving all 4 limbs in bed and antigravity Right upper and lower  extremity ataxia on finger-nose, heel-to-shin Musculoskeletal: Full ROM, No pain with AROM or PROM in the neck, trunk, or extremities.   Assessment/Plan: 1. Functional deficits which require 3+ hours per day of interdisciplinary therapy in a comprehensive inpatient rehab setting. Physiatrist is providing close team supervision and 24 hour management of active medical problems listed below. Physiatrist and rehab team continue to assess barriers to discharge/monitor patient progress toward functional and medical goals  Care Tool:  Bathing    Body parts bathed by patient: Right arm, Left arm, Chest, Abdomen, Front perineal area, Buttocks, Face, Right upper leg, Left upper leg, Right lower leg, Left lower leg   Body parts bathed by helper: Right lower leg, Left lower leg     Bathing assist Assist Level: Supervision/Verbal cueing     Upper Body Dressing/Undressing Upper body dressing   What is the patient wearing?: Pull over shirt    Upper body assist Assist Level: Independent    Lower Body Dressing/Undressing Lower body dressing      What is the patient wearing?: Pants, Underwear/pull up     Lower body assist Assist for lower body dressing: Contact Guard/Touching assist     Toileting Toileting    Toileting assist Assist for toileting: Contact Guard/Touching assist  Transfers Chair/bed transfer  Transfers assist     Chair/bed transfer assist level: Supervision/Verbal cueing     Locomotion Ambulation   Ambulation assist   Ambulation activity did not occur: Safety/medical concerns (requires use of RW for safe ambulation, pt used SPC at baseline)  Assist level: Contact Guard/Touching assist Assistive device: Walker-rolling Max distance: 60   Walk 10 feet activity   Assist     Assist level: Contact Guard/Touching assist Assistive device: Walker-rolling   Walk 50 feet activity   Assist Walk 50 feet with 2 turns activity did not occur:  Safety/medical concerns  Assist level: Contact Guard/Touching assist Assistive device: Walker-rolling    Walk 150 feet activity   Assist Walk 150 feet activity did not occur: Safety/medical concerns         Walk 10 feet on uneven surface  activity   Assist Walk 10 feet on uneven surfaces activity did not occur: Safety/medical concerns         Wheelchair     Assist Is the patient using a wheelchair?: Yes Type of Wheelchair: Manual    Wheelchair assist level: Minimal Assistance - Patient > 75%, Moderate Assistance - Patient 50 - 74% Max wheelchair distance: 17ft with Min/ModA for maintaining safety    Wheelchair 50 feet with 2 turns activity    Assist        Assist Level: Dependent - Patient 0%   Wheelchair 150 feet activity     Assist      Assist Level: Dependent - Patient 0%   Blood pressure 102/69, pulse 74, temperature 98.8 F (37.1 C), temperature source Oral, resp. rate 18, height 5\' 6"  (1.676 m), weight 73.8 kg, SpO2 100%.  Medical Problem List and Plan: 1. 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  Antithrombotics: -DVT/anticoagulation:  Pharmaceutical: Lovenox             -antiplatelet therapy: Aspirin 81 mg daily and Plavix 75 mg day x 90 days then aspirin alone 3. Pain Management: Tylenol as needed 4. Mood/Behavior/Sleep: Provide emotional support             -antipsychotic agents: N/A 5. Neuropsych/cognition: This patient is capable of making decisions on her own behalf. 6. Skin/Wound Care: Routine skin checks 7. Fluids/Electrolytes/Nutrition:  recent labs reviewed and stable 8.  Permissive hypertension.    Patient on Norvasc 2.5 mg daily, Toprol-XL 50 mg  daily prior to admission and has been resumed    8/27 bp well controlled -may be too tightly controlled, with patient complaining of fatigue; would consider dose reducing if this does not improve    09/21/2022    7:55 PM 09/21/2022    2:21 PM 09/21/2022    3:58 AM  Vitals with BMI  Systolic 110 127 161  Diastolic 58 64 69  Pulse 69 65 74    9.  Hypothyroidism.  Continue Synthroid 10.  Hyperlipidemia.  Continue Crestor 11.  Tobacco use.  Chantix.  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  Patient use topical medications only if needed prior to admission.  Clobetasol cream bid currently   -otherwise keep affected  areas clean  13. Microcytic Anemia: Hgb reviewed and is stable, monitor weekly. Iron panel from April reviewed and is within normal limits  14. Screening for vitamin D deficiency: add on vitamin D level today  15. Underweight: BMI reviewed and is 16.08- continue to monitor and provide dietary education  -pt is eating 70-90% of meals.  -offer protein shakes to boost albumin  16.  COnstipation , hard stools due to Fe supp increased colace to BID  -8-30 large soft BM  Last BM 8-31  17.  Nausea.  Reducing Chantix from 1 mg to 0.5 mg twice daily.  -9-1: Discontinued Chantix, as patient is refusing.  States improvement of symptoms.  Monitor.  LOS: 9 days A FACE TO FACE EVALUATION WAS PERFORMED  Susan Farrell 09/21/2022, 8:56 AM

## 2022-09-21 NOTE — Progress Notes (Signed)
Patient refuse Chantix medication states she had a conversation with her DR.and prefers not to take the medication because of "how it makes her feel."Was encourage to communicate further with MD. Medication held

## 2022-09-22 DIAGNOSIS — I63549 Cerebral infarction due to unspecified occlusion or stenosis of unspecified cerebellar artery: Secondary | ICD-10-CM | POA: Diagnosis not present

## 2022-09-22 NOTE — Progress Notes (Signed)
Physical Therapy Session Note  Patient Details  Name: Susan Farrell MRN: [id number] Date of Birth: 03-09-1945  Today's Date: 09/22/2022 PT Individual Time: 1345-1458 PT Individual Time Calculation (min): 73 min   Short Term Goals: Week 1:  PT Short Term Goal 1 (Week 1): Pt will participate in standardized outcome measure to assess balance and falls risk PT Short Term Goal 1 - Progress (Week 1): Met PT Short Term Goal 2 (Week 1): Pt will perform sit<>stands using LRAD with min A PT Short Term Goal 2 - Progress (Week 1): Met PT Short Term Goal 3 (Week 1): Pt will perform bed<>chair transfers using LRAD with min A PT Short Term Goal 3 - Progress (Week 1): Met PT Short Term Goal 4 (Week 1): Pt will ambulate at least 113ft using LRAD with min A PT Short Term Goal 4 - Progress (Week 1): Progressing toward goal PT Short Term Goal 5 (Week 1): Pt will navigate 4 stairs using HRs per home set-up with min A PT Short Term Goal 5 - Progress (Week 1): Progressing toward goal Week 2:  PT Short Term Goal 1 (Week 2): Pt will ambulate at least 95ft using LRAD with min A. PT Short Term Goal 2 (Week 2): Pt will navigate 4 stairs using HRs per home set-up with min A.  Skilled Therapeutic Interventions/Progress Updates:  Patient seated on EOB on entrance to room. Pt in process of donning shoes. Patient alert and agreeable to PT session.   Patient with no pain complaint at start of session.  Acquired SPC in order to utilize during session. No hurrycane present as this is what pt was using pta at home.   Therapeutic Activity: Transfers: Pt performed sit<>stand and stand pivot transfers throughout session with close supervision/ SBA. Provided verbal cues during session for controlled rise as well as descent with UE support on armrest of seat.  Gait Training:  Pt guided in stair training with verbal instructions provided prior to performance. Pt is able to complete four 6" steps using BHR with CGA/ MinA to  ascend leading with RLE and CGA to descend leading with LLE. VC provided at initiation of each direction for leading LE and need for controlled ascent/ descent with RLE.  Following stairs, pt relates need to toilet and taken back to room via w/c and into bathroom. STS and SPVT performed with CGA using safety rail. Pt initiates doffing of pants then requires ModA to complete. Noted incontinence of bowel. Pt able to initiate self care, provided with new underwear, pad, and shorts. Completes pericare with provision of washcloths. CGA for balance in donning underwear and shorts. Returned to therapy gym via w/c.   Pt ambulated 65' x1 using no AD to increase focus on RLE muscle fiber recruitment to maintain stable knee and for LLE coordination and balance with focus on hip musculature and maintaining stable knee. Pt with 2 noted UB sways outside of BOS and requiring MinA to stop LOB and allowing pt to correct to midline. Return trip using SPC in RUE. Pt's RUE with significant decrease in coordination and maintaining still UE for proper support. Height adjusted on cane for next session.   Neuromuscular Re-ed: NMR facilitated during session with focus on standing balance and RLE coordination. Demonstrated to pt simple exercises she can perform at home. Using corner space in home, guided pt through SLS and then addition of hip adduction, extension. Then minisquats with midline orientation and bias toward RLE. Will provide pt with therex to  perform at home with focus on coordinating movements.   NMR performed for improvements in motor control and coordination, balance, sequencing, judgement, and self confidence/ efficacy in performing all aspects of mobility at highest level of independence.   Patient seated on EOB at end of session with brakes locked, bed alarm set, and all needs within reach.   Therapy Documentation Precautions:  Precautions Precautions: Fall, Other (comment) Precaution Comments: L LE  paresis from prior CVA with pt's personal AFO in room Required Braces or Orthoses:  (LLE secondary to previous CVA) Restrictions Weight Bearing Restrictions: No  Pain: Pain Assessment Pain Scale: 0-10 Pain Score: 0-No pain this session.   Therapy/Group: Individual Therapy  [name] PT, DPT, CSRS 09/22/2022, 12:54 PM

## 2022-09-22 NOTE — Progress Notes (Signed)
Occupational Therapy Session Note  Patient Details  Name: MARGUERIETE BOOTEN MRN: [id number] Date of Birth: 04-Nov-1945  Today's Date: 09/22/2022 OT Individual Time: 1136-1208 OT Individual Time Calculation (min): 32 min    Short Term Goals: Week 1:  OT Short Term Goal 1 (Week 1): The pt will complete all functional transfer with ModI  at 95% safe OT Short Term Goal 1 - Progress (Week 1): Progressing toward goal OT Short Term Goal 2 (Week 1): The pt will improve UB strength to 4/5 MMT at 95% safe OT Short Term Goal 2 - Progress (Week 1): Progressing toward goal OT Short Term Goal 3 (Week 1): The pt will donn and doff LB garments with ModI at 95% safe OT Short Term Goal 3 - Progress (Week 1): Progressing toward goal OT Short Term Goal 4 (Week 1): The pt will complete sit to stands at ModI at 95% safe OT Short Term Goal 4 - Progress (Week 1): Progressing toward goal OT Short Term Goal 5 (Week 1): The pt will incorporate relaxation breathing during functional task performance , to reduce the incidence of tremor, for favorable outcome after with initial cue 4 of 5 attempts. OT Short Term Goal 5 - Progress (Week 1): Met Week 2:  OT Short Term Goal 1 (Week 2): STGs = LTGs  Skilled Therapeutic Interventions/Progress Updates:    Pt received EOB ready for therapy. Pt seen this session to focus on R side coordination and dynamic balance.  Pt worked on [employer] of a series of exercises that she could also do at home with her spouse by her side for balance support if needed. Pt completed 12 reps of each exercise: -seated dynamic reach to R holding light weight object and pelvic shift to R -seated reach to R holding dowel bar on bed in vertical position and pushing bar out and in trying to keep bar from wiggling (at home she can use her cane) -seated over head arm reaches with 3 lb dowel bar. - sit to stand with no UE support and close guarding -stand to partial squat with hands on back of chair   -standing and lunging to R and back to neutral with hand on back of chair  Pt participated well and is very motivated to go home.  Resting EOB with all needs met.   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 with pt's personal AFO in room Required Braces or Orthoses:  (LLE secondary to previous CVA) Restrictions Weight Bearing Restrictions: No   Pain: Pain Assessment Pain Scale: 0-10 Pain Score: 0-No pain ADL: ADL Eating: Independent Where Assessed-Eating: Edge of bed Grooming: Supervision/safety Where Assessed-Grooming: Standing at sink Upper Body Bathing: Setup Where Assessed-Upper Body Bathing: Shower Lower Body Bathing: Supervision/safety Where Assessed-Lower Body Bathing: Shower Upper Body Dressing: Independent Where Assessed-Upper Body Dressing: Edge of bed Lower Body Dressing: Contact guard Where Assessed-Lower Body Dressing: Edge of bed, Other (Comment) (standing EOB) Toileting: Contact guard Where Assessed-Toileting: Teacher, adult education: Furniture conservator/restorer Method: Proofreader: Engineer, technical sales: Minimal assistance (based on observation of the pt transferring from bed LOF to the w/c) Tub/Shower Transfer Method: Stand pivot, Event organiser: Administrator, arts Method: Ambulating, Stand pivot [employer]: Grab bars, Transfer tub bench      Therapy/Group: Individual Therapy  Juana Montini 09/22/2022, 12:11 PM

## 2022-09-22 NOTE — Plan of Care (Signed)
  Problem: RH BOWEL ELIMINATION Goal: RH STG MANAGE BOWEL WITH ASSISTANCE Description: STG Manage Bowel with toileting Assistance. Outcome: Progressing Goal: RH STG MANAGE BOWEL W/MEDICATION W/ASSISTANCE Description: STG Manage Bowel with Medication with mod I Assistance. Outcome: Progressing   Problem: RH SAFETY Goal: RH STG ADHERE TO SAFETY PRECAUTIONS W/ASSISTANCE/DEVICE Description: STG Adhere to Safety Precautions With cues Assistance/Device. Outcome: Progressing   Problem: RH PAIN MANAGEMENT Goal: RH STG PAIN MANAGED AT OR BELOW PT'S PAIN GOAL Description: < 4 with prns Outcome: Progressing   Problem: RH KNOWLEDGE DEFICIT Goal: RH STG INCREASE KNOWLEDGE OF DIABETES Description: Patient and spouse will be able to manage prediabetes with dietary modification using educational resources independently Outcome: Progressing Goal: RH STG INCREASE KNOWLEDGE OF HYPERTENSION Description: Patient and spouse will be able to manage HTN with medications and dietary modification using educational resources independently Outcome: Progressing Goal: RH STG INCREASE KNOWLEGDE OF HYPERLIPIDEMIA Description: Patient and spouse will be able to manage HLD with medications and dietary modification using educational resources independently Outcome: Progressing Goal: RH STG INCREASE KNOWLEDGE OF STROKE PROPHYLAXIS Description: Patient and spouse will be able to manage secondary risks with medications and dietary modification using educational resources independently Outcome: Progressing

## 2022-09-22 NOTE — Progress Notes (Signed)
PROGRESS NOTE   Subjective/Complaints:  Off Chantix due to c/o nausea .  SOme improvement with nausea.  No pain c/os .  Pt asking about handicap sticker   Vital stable.   ROS: Patient denies CP, SOB, abd pain.    Objective:   No results found. No results for input(s): "WBC", "HGB", "HCT", "PLT" in the last 72 hours.  No results for input(s): "NA", "K", "CL", "CO2", "GLUCOSE", "BUN", "CREATININE", "CALCIUM" in the last 72 hours.   Intake/Output Summary (Last 24 hours) at 09/22/2022 0848 Last data filed at 09/22/2022 0734 Gross per 24 hour  Intake 558 ml  Output --  Net 558 ml        Physical Exam: Vital Signs Blood pressure 123/69, pulse 65, temperature 98.7 F (37.1 C), temperature source Oral, resp. rate 18, height 5\' 6"  (1.676 m), weight 73.8 kg, SpO2 100%.   General: No acute distress.  In bed.  Family at bedside. Mood and affect are appropriate Heart: Regular rate and rhythm no rubs murmurs or extra sounds Lungs: Clear to auscultation, breathing unlabored, no rales or wheezes Abdomen: Positive bowel sounds, soft nontender to palpation, nondistended Extremities: No clubbing, cyanosis, or edema Skin: No evidence of breakdown, no evidence of rash. + Discoid lupus plaques throughout, stable   Musculoskeletal: Full range of motion in all 4 extremities. No joint swelling  Neuro:  Alert and oriented x 3. Normal insight and awareness. Intact Memory. Sl word finding deficits but easily able to communicate, speech was clear. Cranial nerve exam unremarkable.  MMT: RUE and RLE 5/5. LUE 4+/5, LLE 3- to 3+/5 prox to distal.  -Right upper and lower extremity moderate ataxia on finger-nose, heel-to-shin Musculoskeletal: Full ROM, No pain with AROM or PROM in the neck, trunk, or extremities.   Assessment/Plan: 1. Functional deficits which require 3+ hours per day of interdisciplinary therapy in a comprehensive inpatient  rehab setting. Physiatrist is providing close team supervision and 24 hour management of active medical problems listed below. Physiatrist and rehab team continue to assess barriers to discharge/monitor patient progress toward functional and medical goals  Care Tool:  Bathing    Body parts bathed by patient: Right arm, Left arm, Chest, Abdomen, Front perineal area, Buttocks, Face, Right upper leg, Left upper leg, Right lower leg, Left lower leg   Body parts bathed by helper: Right lower leg, Left lower leg     Bathing assist Assist Level: Supervision/Verbal cueing     Upper Body Dressing/Undressing Upper body dressing   What is the patient wearing?: Pull over shirt    Upper body assist Assist Level: Independent    Lower Body Dressing/Undressing Lower body dressing      What is the patient wearing?: Pants, Underwear/pull up     Lower body assist Assist for lower body dressing: Contact Guard/Touching assist     Toileting Toileting    Toileting assist Assist for toileting: Contact Guard/Touching assist     Transfers Chair/bed transfer  Transfers assist     Chair/bed transfer assist level: Supervision/Verbal cueing     Locomotion Ambulation   Ambulation assist   Ambulation activity did not occur: Safety/medical concerns (requires use of RW for  safe ambulation, pt used SPC at baseline)  Assist level: Contact Guard/Touching assist Assistive device: Walker-rolling Max distance: 60   Walk 10 feet activity   Assist     Assist level: Contact Guard/Touching assist Assistive device: Walker-rolling   Walk 50 feet activity   Assist Walk 50 feet with 2 turns activity did not occur: Safety/medical concerns  Assist level: Contact Guard/Touching assist Assistive device: Walker-rolling    Walk 150 feet activity   Assist Walk 150 feet activity did not occur: Safety/medical concerns         Walk 10 feet on uneven surface  activity   Assist Walk 10  feet on uneven surfaces activity did not occur: Safety/medical concerns         Wheelchair     Assist Is the patient using a wheelchair?: Yes Type of Wheelchair: Manual    Wheelchair assist level: Minimal Assistance - Patient > 75%, Moderate Assistance - Patient 50 - 74% Max wheelchair distance: 68ft with Min/ModA for maintaining safety    Wheelchair 50 feet with 2 turns activity    Assist        Assist Level: Dependent - Patient 0%   Wheelchair 150 feet activity     Assist      Assist Level: Dependent - Patient 0%   Blood pressure 123/69, pulse 65, temperature 98.7 F (37.1 C), temperature source Oral, resp. rate 18, height 5\' 6"  (1.676 m), weight 73.8 kg, SpO2 100%.  Medical Problem List and Plan: 1. 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  Antithrombotics: -DVT/anticoagulation:  Pharmaceutical: Lovenox             -antiplatelet therapy: Aspirin 81 mg daily and Plavix 75 mg day x 90 days then aspirin alone 3. Pain Management: Tylenol as needed 4. Mood/Behavior/Sleep: Provide emotional support             -antipsychotic agents: N/A 5. Neuropsych/cognition: This patient is capable of making decisions on her own behalf. 6. Skin/Wound Care: Routine skin checks 7. Fluids/Electrolytes/Nutrition:  recent labs reviewed and stable 8.  Permissive hypertension.    Patient on Norvasc 2.5 mg daily, Toprol-XL 50 mg daily prior to admission and has been resumed    8/27 bp well controlled -may be too tightly controlled, with patient complaining of fatigue; would consider dose reducing if this does not improve    09/22/2022    4:47 AM 09/21/2022    7:55 PM 09/21/2022    2:21 PM  Vitals with BMI   Systolic 123 110 329  Diastolic 69 58 64  Pulse 65 69 65    9.  Hypothyroidism.  Continue Synthroid 10.  Hyperlipidemia.  Continue Crestor 11.  Tobacco use.  Chantix.  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  Patient use topical medications only if needed prior to admission.  Clobetasol cream bid currently   -otherwise keep affected areas clean  13. Microcytic Anemia: Hgb reviewed and is stable, monitor weekly. Iron panel from April reviewed and is within normal limits  14. Screening for vitamin D deficiency: add on vitamin D level today  15. Underweight: BMI  reviewed and is 16.08- continue to monitor and provide dietary education  -pt is eating 70-90% of meals.  -offer protein shakes to boost albumin  16.  COnstipation , hard stools due to Fe supp increased colace to BID  LBM 9/1  17.  Nausea.  Reducing Chantix from 1 mg to 0.5 mg twice daily.  Discontinued Chantix, last dose was 8/31 LOS: 10 days A FACE TO FACE EVALUATION WAS PERFORMED  Erick Colace 09/22/2022, 8:48 AM

## 2022-09-22 NOTE — Progress Notes (Signed)
Patient ID: Susan Farrell, female   DOB: 1945/11/19, 77 y.o.   MRN: [id number]  SW met with patient. Patient requesting a handicapped placement card application. No additional questions or concerns.

## 2022-09-22 NOTE — Progress Notes (Signed)
Occupational Therapy Session Note  Patient Details  Name: Susan Farrell MRN: [id number] Date of Birth: [birth year]  Today's Date: 09/22/2022 OT Individual Time: 3474-2595 session 1 OT Individual Time Calculation (min): 71 min  Session 2: 6387-5643  Short Term Goals: Week 2:  OT Short Term Goal 1 (Week 2): STGs = LTGs  Skilled Therapeutic Interventions/Progress Updates:  Session 1: pt greeted supine in bed, pt agreeable to OT intervention. Pt completed supine>sit MODI. Pt donned shoes from EOB with set- up assist. Pt completed stand pivot to w/c with no AD and supervision. Total A transport to gym for time mgmt. Pt completed various therapeutic activities focused LLE NMR, standing balance and increasing overall strength/endurance.   Pt instructed to step over obstacle to R and L side with BUE support in order to transport clothespin from one side of the table to the other. Pt with increased difficulty elevating LLE over obstacle using her L hip ABD to compensate for lack of knee flexion. Pt completed task with overall CGA Worked on Targeted steps with an emphasis on LLE NMR with pt instructed to step to targets on the floor to reach dynamically to basketball goal to remove clothes pins. Pt completed task with CGA, MIN cues needed to over exaggerate weight bearing into affect LLE.   Pt completed Functional mobility task  with pt instructed to transport items with an emphasis on short tight turns to simulate home level mobility  and challenge dynamic balance. Pt completed task with CGA.   Pt transported outside with total A to improve mood, overall affect and increase pt buy in. Pt completed functional ambulation outside with Rw across uneven surfaces and around obstacles to simulate community level mobility as pt reports she does enjoy being in the community. Pt asking about driving, recommended she ask MD. Problem solved through community level tasks such as grocery shopping. Pt reports she does  use motorized cart at store and she has her husband bring in the groceries.   Transported pt inside with total A, ended session with pt seated EOB with all needs within reach.     Session 2: pt greeted seated EOB, pt agreeable to OT intervention. Pt donned shoes/AFO from EOB with set- up assist. Stand pivot to w/c with no AD and supervision. Total A transport to gym for time mgmt. Pt completed simulated IADL task with RW to simulate picking up heavier items from below knee level to simulate reaching for items in the grocery store. Pt able to ambulate around the gym with Rw and CGA to collect items as heavy as 4 lbs from floor level with CGA.   Pt reports she feels like her RLE is doing more work than it should, education provided on RLE over compensating for impaired strength/ROM in LLE. Discussed exercises for LLE at home with pt education provided on standing exercises that pt could complete at kitchen counter as pt continues to compensate for lack of L hip/knee flexion, exercises included standing marches with LLE and hip ABD/ADD with glider under LLE.   Pt transported back to room with total A, stand pivot back to EOB with supervision. Ended session with pt seated EOB with all needs within reach.   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

## 2022-09-23 DIAGNOSIS — I63549 Cerebral infarction due to unspecified occlusion or stenosis of unspecified cerebellar artery: Secondary | ICD-10-CM | POA: Diagnosis not present

## 2022-09-23 MED ORDER — FLUTICASONE PROPIONATE 50 MCG/ACT NA SUSP
1.0000 | Freq: Every day | NASAL | Status: DC
  Administered 2022-09-23 – 2022-09-24 (×2): 1 via NASAL
  Filled 2022-09-23: qty 16

## 2022-09-23 NOTE — Progress Notes (Signed)
Physical Therapy Session Note  Patient Details  Name: Susan Farrell MRN: [id number] Date of Birth: 1945/08/30  Today's Date: 09/23/2022 PT Individual Time: 8413-2440 PT Individual Time Calculation (min): 74 min   Short Term Goals: Week 2:  PT Short Term Goal 1 (Week 2): Pt will ambulate at least 23ft using LRAD with min A. PT Short Term Goal 2 (Week 2): Pt will navigate 4 stairs using HRs per home set-up with min A.  Skilled Therapeutic Interventions/Progress Updates:    Pt received supine in bed awake and eager to participate in therapy session. Supine>sitting R EOB, HOB elevated maximally and using bedrail, mod-I. Sitting EOB donned L LE [place] Walk-on PLS AFO and shoes set-up assist. Provided pt with CPO's information if pt needs additional support with AFO after D/C.   Sit<>stands using RW throughout session at close supervision.  Gait training ~127ft using RW with close SBA for safety to main therapy gym. Pt demonstrating the following gait deviations with therapist providing the described cuing and facilitation for improvement:  - reciprocal stepping pattern - slight L LE external rotation during swing advancement with mild circumduction compensation due to decreased L hip/knee flexion muscle activation - a few instances of L knee snapping into hyperextension - L knee "wabble" during mid-stance phase   Gait training ~86ft using wide based quad cane in R UE with light min assist for balance. Pt demonstrating the following gait deviations with therapist providing the described cuing and facilitation for improvement:  - similar gait deviations as above but with increase in balance instability - noticed R lateral trunk lean over onto quad-cane due to pt feeling insecure in L stance phase stability  Stair navigation training ascending/descending 4 steps (6" height) using quad cane in R UE and L UE support on each HR with CGA and 1x light min assist for balance - performed step-to  pattern leading with R LE on ascent and L LE on descent. Reports her back entrance has 4 STE without HRs, but that she plans to use front entrance with only 1 step-up.   Step navigation training via stepping on/off 1, 5" step x2 reps using quad cane in R UE with light min A for balance leading with R LE on ascent and L LE on descent  Gait training 166ft, no UE support, with consistent min assist for balance. Pt demonstrating the following gait deviations with therapist providing the described cuing and facilitation for improvement:  - lack of L LE foot clearance during swing, with consistent toe drag - improvement in trunk in midline compared to when using quad-cane, but overall increased postural instability - slow gait speed - short step lengths bilaterally - a few instance of L knee snapping back into extension  Applied L LE functional electrical stimulation (FES) to hamstring muscles during gait training using Empi e-stim device set on NMES L with trigger switch to time activation of stimulation during swing of gait - intensity 40 with palpable and visible muscle contraction - after completion of e-stim, pt denies any negative side effects with skin intact and no adverse side effects noted.  Gait training additional 14ft, no UE support, with min assist for balance while utilizing above e-stim for improved swing phase foot clearance with no significant change noted. Progressed to  backwards gait training ~58ft, no UE support, with heavier min/light mod A for balance due to strong posterior lean and pt with increased fear of falling with this task - continues to have impaired L  swing and stance phase control due to hemiparesis.   Pt reporting fatigue. Gait training ~152ft back to room using quad cane in R UE with light min A for balance - with fatigue pt has worsening L foot drag during swing therefore performing vaulting on R side for compensation.   At end of session, pt left seated on EOB with  needs in reach and bed alarm on.   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   Therapy/Group: Individual Therapy  Ginny Forth , PT, DPT, NCS, CSRS 09/23/2022, 3:17 PM

## 2022-09-23 NOTE — Progress Notes (Signed)
Occupational Therapy Session Note  Patient Details  Name: Susan Farrell MRN: [id number] Date of Birth: 10-24-1945  Today's Date: 09/23/2022 OT Individual Time: 1036-1140 OT Individual Time Calculation (min): 64 min    Short Term Goals: Week 2:  OT Short Term Goal 1 (Week 2): STGs = LTGs  Skilled Therapeutic Interventions/Progress Updates:    Pt received sitting EOB ready for therapy.  Focus of therapy session on RUE coordination.  Nursing asked if pt could be Mod Ind in the room as she got up earlier this am and took herself to the bathroom. Pt is able to turn off the bed alarm herself.  Pt reported she did call for assist but she needed to  urinate urgently so got up by herself.  Will discuss with her PT to determine if she can be mod I in the room. Pt practiced bed to wc transfers 2x with RW with no cues or A needed with no LOB.   Pt then taken to day room to work on R [facility] with Pill Box test. Pt performed test correctly but it took her 10 minutes vs the 5 min the test allows for due to slower R hand FMC.  She was able to pick up the small pills and open pill boxes without A.  To replace the pills in the bottles had her do the task with tweezers.   For her R arm, took pt outside to sit in the sun as the weather was extremely pleasant.  Had pt practice exercises she can do at home using a 2 lb hand weight. She said her spouse has one.   Pt completed each exercise for shoulder and back stability and strength using L arm first for 10-12 reps and then in R arm.  Her R arm is actually stronger but has a tremor as she compensates for her incoordination. She also worked on torso twists, cross chops using weight in B hands.  Pt returned to room.   Pt resting on bed with all needs met.call light in reach.        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 with pt's personal AFO in room Required Braces or  Orthoses:  (LLE secondary to previous CVA) Restrictions Weight Bearing Restrictions: No    Pain: Pain Assessment Pain Scale: 0-10 Pain Score: 0-No pain ADL: ADL Eating: Independent Where Assessed-Eating: Edge of bed Grooming: Supervision/safety Where Assessed-Grooming: Standing at sink Upper Body Bathing: Setup Where Assessed-Upper Body Bathing: Shower Lower Body Bathing: Supervision/safety Where Assessed-Lower Body Bathing: Shower Upper Body Dressing: Independent Where Assessed-Upper Body Dressing: Edge of bed Lower Body Dressing: Contact guard Where Assessed-Lower Body Dressing: Edge of bed, Other (Comment) (standing EOB) Toileting: Contact guard Where Assessed-Toileting: Teacher, adult education: Furniture conservator/restorer Method: Proofreader: Acupuncturist: Administrator, arts Method: Ambulating, Stand pivot [employer]: Grab bars, Transfer tub bench   Therapy/Group: Individual Therapy  Jaylin Roundy 09/23/2022, 11:56 AM

## 2022-09-23 NOTE — Progress Notes (Signed)
Physical Therapy Session Note  Patient Details  Name: Susan Farrell MRN: [id number] Date of Birth: 10/12/1945  Today's Date: 09/23/2022 PT Individual Time: 0910-1005 PT Individual Time Calculation (min): 55 min   Short Term Goals: Week 2:  PT Short Term Goal 1 (Week 2): Pt will ambulate at least 29ft using LRAD with min A. PT Short Term Goal 2 (Week 2): Pt will navigate 4 stairs using HRs per home set-up with min A.  Skilled Therapeutic Interventions/Progress Updates: Patient supine in bed on entrance to room. Patient alert and agreeable to PT session.   Patient reported no pain at beginning of PT session.  Therapeutic Activity: Bed Mobility: Pt performed supine<>sit on EOB with modI. No VC required this session Transfers: Pt performed sit<>stand transfers throughout session with modI. No VC required this session.  Gait Training:  Pt ambulated 150' using RW with supervision, and then CGA towards end for safety (intermittently, but mostly supervision as pt started to fatigue some). Pt presented with decreased stance time on L LE, decreased cadence and decreased step clearance bilaterally; although, pt is noted to have increased L hip flexion vs the short days following inpatient evaluation (pt also reported feeling more confident when walking and pulling L LE through swing phase - pt also reported a decrease in feelings of pulling to the R as previously felt by pt when she was admitted to inpatient rehab).   Stair Navigation:  - Ascending 4 (6") steps with use of RHR, and with CGA. Pt with step to pattern leading with R LE x 2 rounds. Pt presents with L hip hike to elevate L LE (pt reports using this compensation since previous stroke a year ago).  - Descending 4 (6") steps with use of RHR, and with CGA. Pt with step to pattern leading with L LE x 2 rounds  Neuromuscular Re-ed: NMR facilitated during session with focus on R LE WB for proprioceptive feedback and coordination. - 50'  ambulation with 5lb ankle weight on R LE and R HHA (min/heavy minA). Pt cued to increase step length on R LE per presentation of decreased stance time on L LE. Pt required 2 short standing rest breaks per reports of fatigue. Pt with increased cues at end to perform same cues on R LE, and to increase step clearance on L as it started to drag on floor. - Seated hip flexion + abduction + adduction over 2 cups until fatigue for 2 rounds with rest break between rounds (with 5lb ankle weight on R LE). On 2nd round, pt performed until fatigue and had trouble avoiding knocking off top cup, at that point the top cup removed and pt to perform same sequence until fatigue.   NMR performed for improvements in motor control and coordination, balance, sequencing, judgement, and self confidence/ efficacy in performing all aspects of mobility at highest level of independence.   Patient sitting EOB at end of session with brakes locked, bed alarm set, and all needs within reach.      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

## 2022-09-23 NOTE — Progress Notes (Signed)
PROGRESS NOTE   Subjective/Complaints:  No issues overnite, no pains  Vital stable.   ROS: Patient denies CP, SOB, abd pain.    Objective:   No results found. No results for input(s): "WBC", "HGB", "HCT", "PLT" in the last 72 hours.  No results for input(s): "NA", "K", "CL", "CO2", "GLUCOSE", "BUN", "CREATININE", "CALCIUM" in the last 72 hours.   Intake/Output Summary (Last 24 hours) at 09/23/2022 0719 Last data filed at 09/22/2022 2000 Gross per 24 hour  Intake 716 ml  Output --  Net 716 ml        Physical Exam: Vital Signs Blood pressure 126/66, pulse 64, temperature 98.4 F (36.9 C), resp. rate 16, height 5\' 6"  (1.676 m), weight 73.8 kg, SpO2 98%.   General: No acute distress.  In bed.  Family at bedside. Mood and affect are appropriate Heart: Regular rate and rhythm no rubs murmurs or extra sounds Lungs: Clear to auscultation, breathing unlabored, no rales or wheezes Abdomen: Positive bowel sounds, soft nontender to palpation, nondistended Extremities: No clubbing, cyanosis, or edema Skin: No evidence of breakdown, no evidence of rash. + Discoid lupus plaques throughout, stable   Musculoskeletal: Full range of motion in all 4 extremities. No joint swelling  Neuro:  Alert and oriented x 3. Normal insight and awareness.  speech was clear. Cranial nerve exam unremarkable.  MMT: RUE and RLE 5/5. LUE 4+/5, LLE 3- to 3+/5 prox to distal.  -Right upper and lower extremity moderate ataxia on finger-nose, heel-to-shin Musculoskeletal: Full ROM, No pain with AROM or PROM in the neck, trunk, or extremities.   Assessment/Plan: 1. Functional deficits which require 3+ hours per day of interdisciplinary therapy in a comprehensive inpatient rehab setting. Physiatrist is providing close team supervision and 24 hour management of active medical problems listed below. Physiatrist and rehab team continue to assess barriers to  discharge/monitor patient progress toward functional and medical goals  Care Tool:  Bathing    Body parts bathed by patient: Right arm, Left arm, Chest, Abdomen, Front perineal area, Buttocks, Face, Right upper leg, Left upper leg, Right lower leg, Left lower leg   Body parts bathed by helper: Right lower leg, Left lower leg     Bathing assist Assist Level: Supervision/Verbal cueing     Upper Body Dressing/Undressing Upper body dressing   What is the patient wearing?: Pull over shirt    Upper body assist Assist Level: Independent    Lower Body Dressing/Undressing Lower body dressing      What is the patient wearing?: Pants, Underwear/pull up     Lower body assist Assist for lower body dressing: Contact Guard/Touching assist     Toileting Toileting    Toileting assist Assist for toileting: Supervision/Verbal cueing     Transfers Chair/bed transfer  Transfers assist     Chair/bed transfer assist level: Supervision/Verbal cueing     Locomotion Ambulation   Ambulation assist   Ambulation activity did not occur: Safety/medical concerns (requires use of RW for safe ambulation, pt used SPC at baseline)  Assist level: Contact Guard/Touching assist Assistive device: Walker-rolling Max distance: 60   Walk 10 feet activity   Assist     Assist  level: Contact Guard/Touching assist Assistive device: Walker-rolling   Walk 50 feet activity   Assist Walk 50 feet with 2 turns activity did not occur: Safety/medical concerns  Assist level: Contact Guard/Touching assist Assistive device: Walker-rolling    Walk 150 feet activity   Assist Walk 150 feet activity did not occur: Safety/medical concerns         Walk 10 feet on uneven surface  activity   Assist Walk 10 feet on uneven surfaces activity did not occur: Safety/medical concerns         Wheelchair     Assist Is the patient using a wheelchair?: Yes Type of Wheelchair: Manual     Wheelchair assist level: Minimal Assistance - Patient > 75%, Moderate Assistance - Patient 50 - 74% Max wheelchair distance: 92ft with Min/ModA for maintaining safety    Wheelchair 50 feet with 2 turns activity    Assist        Assist Level: Dependent - Patient 0%   Wheelchair 150 feet activity     Assist      Assist Level: Dependent - Patient 0%   Blood pressure 126/66, pulse 64, temperature 98.4 F (36.9 C), resp. rate 16, height 5\' 6"  (1.676 m), weight 73.8 kg, SpO2 98%.  Medical Problem List and Plan: 1. 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  Antithrombotics: -DVT/anticoagulation:  Pharmaceutical: Lovenox             -antiplatelet therapy: Aspirin 81 mg daily and Plavix 75 mg day x 90 days then aspirin alone 3. Pain Management: Tylenol as needed 4. Mood/Behavior/Sleep: Provide emotional support             -antipsychotic agents: N/A 5. Neuropsych/cognition: This patient is capable of making decisions on her own behalf. 6. Skin/Wound Care: Routine skin checks 7. Fluids/Electrolytes/Nutrition:  recent labs reviewed and stable 8.  Permissive hypertension.    Patient on Norvasc 2.5 mg daily, Toprol-XL 50 mg daily prior to admission and has been resumed    8/27 bp well controlled -may be too tightly controlled, with patient complaining of fatigue; would consider dose reducing if this does not improve    09/23/2022    5:07 AM 09/22/2022    3:09 PM 09/22/2022    4:47 AM  Vitals with BMI  Systolic 126 115 696  Diastolic 66 66 69  Pulse 64 64 65    9.  Hypothyroidism.  Continue Synthroid 10.  Hyperlipidemia.  Continue Crestor 11.  Tobacco use.  Chantix.  Provide  counseling 12.  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  Patient use topical medications only if needed prior to admission.  Clobetasol cream bid currently   -otherwise keep affected areas clean  13. Microcytic Anemia: Hgb reviewed and is stable, monitor weekly. Iron panel from April reviewed and is within normal limits  14. Screening for vitamin D deficiency: add on vitamin D level today  15. Underweight: BMI reviewed and is 16.08- continue to monitor and provide dietary education  -pt is eating 70-90% of meals.  -offer protein shakes to boost albumin  16.  COnstipation , hard  stools due to Fe supp increased colace to BID  LBM 9/1  17.  Nausea.  Improved .  Discontinued Chantix, last dose was 8/31 LOS: 11 days A FACE TO FACE EVALUATION WAS PERFORMED  Erick Colace 09/23/2022, 7:19 AM

## 2022-09-23 NOTE — Progress Notes (Signed)
   09/23/22 1425  Spiritual Encounters  Type of Visit Initial  Care provided to: Patient  Referral source Patient request  Reason for visit Routine spiritual support  OnCall Visit No  Spiritual Framework  Presenting Themes Values and beliefs  Values/beliefs patient is of the [place] faith, patient believes in prayer  Patient Stress Factors Not reviewed  Family Stress Factors Not reviewed  Interventions  Spiritual Care Interventions Made Established relationship of care and support;Compassionate presence;Reflective listening;Explored values/beliefs/practices/strengths;Prayer;Encouragement  Intervention Outcomes  Outcomes Connection to spiritual care;Awareness around self/spiritual resourses;Awareness of support  Spiritual Care Plan  Spiritual Care Issues Still Outstanding No further spiritual care needs at this time (see row info)   Patient stated she requested a chaplain to pray with her. Patient asked chaplain to pray for her right leg and side. Chaplain prayed with the patient.   Arlyce Dice, Chaplain Resident [phone]

## 2022-09-24 DIAGNOSIS — I63549 Cerebral infarction due to unspecified occlusion or stenosis of unspecified cerebellar artery: Secondary | ICD-10-CM | POA: Diagnosis not present

## 2022-09-24 NOTE — Progress Notes (Signed)
Occupational Therapy Session Note  Patient Details  Name: Susan Farrell MRN: [id number] Date of Birth: 18-Apr-1945  Today's Date: 09/24/2022 OT Individual Time: 1434-1530 OT Individual Time Calculation (min): 56 min    Short Term Goals: Week 2:  OT Short Term Goal 1 (Week 2): STGs = LTGs  Skilled Therapeutic Interventions/Progress Updates:  Pt greeted seated EOB with recreational therapist present during session. Pt completed stand pivot transfer from EOB>w/c with RW with distant supervision. Total A transport to gym for time mgmt. Focused on fall recovery. Education provided on general fall recovery method at home. Pt able to transition from sitting EOM>floor by slowly lowering down onto floor. Pt then able to boost self back up onto mat using BUEs but needed MIN A +2 to fully boost hips back on to mat. Pt unable to complete quadraped method d/t impaired active movement in LLE. Education provided on being able to direct husband on how to assist pt if a fall were to occur at home as well as being aware of the local fire dept number to use as needed if a fall were to occur.   Pt transported to atrium with pt able to ambulate around gift shop to simulate community level mobility with RW MODI, asked pt to look for 3 specifics items with pt only able to recall 2/3 items. Pt also completed functional mobility in atrium to booths to simulate mobility in a restaurant. Education provided on recommendation of going to a familiar restaurant first before trying new places. Pt also completed functional mobility in "waiting room" to simulate moving around in a doctors office setting, pt completed all functional mobility with Rw MODI.   Pt transported back to room with total A, pt completed ambulaotry toilet transfer with RW MODI, pt with continent urine void able to complete 3/3 toileting tasks MODI. Pt did have mild LOB when reaching to pick up clothes in bathroom needing MINA.   Ended session with pt seated  EOB with all needs within reach.  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

## 2022-09-24 NOTE — Progress Notes (Signed)
Physical Therapy Discharge Summary  Patient Details  Name: Susan Farrell MRN: [id number] Date of Birth: 06/25/1945  Date of Discharge from PT service:September 24, 2022  Today's Date: 09/24/2022 PT Individual Time: 0803-0900 PT Individual Time Calculation (min): 57 min    Patient has met {NUMBERS 0-12:18577} of {NUMBERS 0-12:18577} long term goals due to improved balance, increased strength, functional use of  right upper extremity and right lower extremity, and improved coordination.  Patient to discharge at an ambulatory level Supervision.   Patient's care partner is independent to provide the necessary physical assistance at discharge.  Reasons goals not met: ***  Recommendation:  Patient will benefit from ongoing skilled PT services in outpatient setting to continue to advance safe functional mobility, address ongoing impairments in strength, coordination, balance, activity tolerance, cognition, safety awareness, and to minimize fall risk.  Equipment: No equipment provided  Reasons for discharge: treatment goals met and discharge from hospital  Patient/family agrees with progress made and goals achieved: Yes  PT Discharge Precautions/Restrictions   Vital Signs Therapy Vitals Temp: 98.2 F (36.8 C) Pulse Rate: 68 Resp: 16 BP: 109/64 Patient Position (if appropriate): Lying Oxygen Therapy SpO2: 100 % O2 Device: Room Air Pain   Pain Interference   Vision/Perception     Manufacturing engineer     Trunk/Postural Assessment     Balance Balance Balance Assessed: Yes Standardized Balance Assessment Standardized Balance Assessment: Berg Balance Test;Timed Up and Go Test Berg Balance Test Sit to Stand: Able to stand  independently using hands Standing Unsupported: Able to stand 2 minutes with supervision Sitting with Back Unsupported but Feet Supported on Floor or Stool: Able to sit safely and securely 2 minutes Stand to  Sit: Controls descent by using hands Transfers: Able to transfer safely, definite need of hands Standing Unsupported with Eyes Closed: Able to stand 10 seconds with supervision Standing Ubsupported with Feet Together: Needs help to attain position but able to stand for 30 seconds with feet together From Standing, Reach Forward with Outstretched Arm: Can reach confidently >25 cm (10") From Standing Position, Pick up Object from Floor: Able to pick up shoe, needs supervision From Standing Position, Turn to Look Behind Over each Shoulder: Needs supervision when turning Turn 360 Degrees: Needs close supervision or verbal cueing Standing Unsupported, Alternately Place Feet on Step/Stool: Needs assistance to keep from falling or unable to try Standing Unsupported, One Foot in Front: Able to take small step independently and hold 30 seconds Standing on One Leg: Unable to try or needs assist to prevent fall Total Score: 31 Timed Up and Go Test TUG: Normal TUG Extremity Assessment            [name] 09/24/2022, 8:57 AM

## 2022-09-24 NOTE — Progress Notes (Signed)
Patient ID: Susan Farrell, female   DOB: [birth year], 77 y.o.   MRN: [id number]  OP referral faxed to OP at [facility].

## 2022-09-24 NOTE — Progress Notes (Signed)
Patient ID: Susan Farrell, female   DOB: [birth year], 77 y.o.   MRN: [id number]  Team Conference Report to Patient/Family  Team Conference discussion was reviewed with the patient and caregiver, including goals, any changes in plan of care and target discharge date.  Patient and caregiver express understanding and are in agreement.  The patient has a target discharge date of 09/25/22.  Sw met with patient and provided team conference updates. Patient met all goals and ready for d/c tomorrow. No additional questions or concerns.   Susan Farrell 09/24/2022, 1:12 PM

## 2022-09-24 NOTE — Progress Notes (Signed)
Patient ID: Susan Farrell, female   DOB: 10-Mar-1945, 77 y.o.   MRN: [id number]   Patient ready for d/c in AM. Handicapped placement card application provided. No additional questions or concerns currently.

## 2022-09-24 NOTE — Progress Notes (Signed)
Inpatient Rehabilitation Care Coordinator Discharge Note   Patient Details  Name: Susan Farrell MRN: [id number] Date of Birth: 17-Jun-1945   Discharge location: Home  Length of Stay: 13 Days  Discharge activity level: Supervision  Home/community participation: Spouse  Patient response NW:GNFAOZ Literacy - How often do you need to have someone help you when you read instructions, pamphlets, or other written material from your doctor or pharmacy?: Rarely  Patient response HY:QMVHQI Isolation - How often do you feel lonely or isolated from those around you?: Never  Services provided included: MD, RD, PT, OT, RN, SLP, CM, TR, Pharmacy, Neuropsych, SW  Financial Services:  Field seismologist Utilized: Private Insurance [employer]  Choices offered to/list presented to: Patient and Spouse  Follow-up services arranged:  Outpatient, DME    Outpatient Servicies: [place] PT OT DME : none    Patient response to transportation need: Is the patient able to respond to transportation needs?: Yes In the past 12 months, has lack of transportation kept you from medical appointments or from getting medications?: No In the past 12 months, has lack of transportation kept you from meetings, work, or from getting things needed for daily living?: No   Patient/Family verbalized understanding of follow-up arrangements:  Yes  Individual responsible for coordination of the follow-up plan: self or spouse, [phone]  Confirmed correct DME delivered: Andria Rhein 09/24/2022    Comments (or additional information):  Summary of Stay    Date/Time Discharge Planning CSW  09/23/22 1320 Patient discharging home on Thursday with support from husband, family and church members. Spouse declined family education. Patient has all DME. No barriers. CJB  09/16/22 1502 Pt will d/c to home with support from her husband, family memebrs, and church members. SW will confirm there are no  barriers to discharge. AAC       Andria Rhein

## 2022-09-24 NOTE — Progress Notes (Signed)
Occupational Therapy Session Note  Patient Details  Name: Susan Farrell MRN: [id number] Date of Birth: Sep 04, 1945  Today's Date: 09/24/2022 OT Individual Time: 1105-1205 OT Individual Time Calculation (min): 60 min    Short Term Goals: Week 2:  OT Short Term Goal 1 (Week 2): STGs = LTGs  Skilled Therapeutic Interventions/Progress Updates:  Pt received on bed ready for therapy.  Focus of therapy session on safe mobility and dynamic balance. Pt requested to shower.  Pt was able to get up and use RW to ambulate to dresser to gather clothing and then to shower bench. Pt transferred into shower with supervision. Once in shower she doffed clothing. She had one near LOB posteriorly but used grab bar to catch herself.  Reviewed with pt why she lost balance, she did had her feet too far forward and she had her hips behind her feet as she tried to pull her pants down. Reemphasized the importance of correct placement and positioning to ensure she is stable before she releases both hands to do a task.    Pt bathed with mod I and stood a second time during the shower to stand safely with good positioning. Ambulated to EOB to dress with mod I and then to sink to complete oral care.    Pt taken to gym to repeat box and block test.  28 blocks in 60 seconds with R hand vs 26 a week ago, so not much change.  Worked on Chiropractor.  Barely legible due to incoordination but with 1 lb wrist cuff it was legible.  Recommended pt get a light wrist weight and practice writing and prewriting skills.   Wrote out a simple HEP as pt will be doing outpt therapy.   HEP focused on [facility] with writing, R arm coordination with arm exercises, sitting balance.  Also wrote out a reminder sheet for safe sit to stand and standing strategies.  Reviewed with pt safe shower transfers at home to step in backwards (as she practiced this am) and stepping over the ledge backwards leading with her R foot and then stepping out of shower  leading with her R foot.   Pt taken back to room and demonstrated safe sit to stand from wc with RW to ambulate 5 feet to bed and sit to bed with Mod I.  Pt in room with all needs met.          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 with pt's personal AFO in room Required Braces or Orthoses:  (LLE secondary to previous CVA) Restrictions Weight Bearing Restrictions: No     Pain: Pain Assessment Pain Scale: 0-10 Pain Score: 0-No pain ADL: ADL Eating: Independent Where Assessed-Eating: Edge of bed Grooming: Independent Where Assessed-Grooming: Standing at sink Upper Body Bathing: Independent Where Assessed-Upper Body Bathing: Shower Lower Body Bathing: Modified independent Where Assessed-Lower Body Bathing: Shower Upper Body Dressing: Independent Where Assessed-Upper Body Dressing: Edge of bed Lower Body Dressing: Modified independent Where Assessed-Lower Body Dressing: Edge of bed, Other (Comment) (standing EOB) Toileting: Modified independent Where Assessed-Toileting: Teacher, adult education: Modified Community education officer Method: Proofreader: Engineer, technical sales: Other (comment) (n/a pt does not use a tub at home) Tub/Shower Transfer Method: Stand pivot, Event organiser: Close supervision Film/video editor Method: Ambulating, Stand pivot [employer]: Shower seat with back     Therapy/Group: Individual Therapy  Atthew Coutant 09/24/2022, 12:14 PM

## 2022-09-24 NOTE — Progress Notes (Signed)
PROGRESS NOTE   Subjective/Complaints: . No issues overnite , pt feels ready for d/c in am   ROS: Patient denies CP, SOB, abd pain.    Objective:   No results found. No results for input(s): "WBC", "HGB", "HCT", "PLT" in the last 72 hours.  No results for input(s): "NA", "K", "CL", "CO2", "GLUCOSE", "BUN", "CREATININE", "CALCIUM" in the last 72 hours.   Intake/Output Summary (Last 24 hours) at 09/24/2022 0757 Last data filed at 09/23/2022 2200 Gross per 24 hour  Intake 460 ml  Output --  Net 460 ml        Physical Exam: Vital Signs Blood pressure 109/64, pulse 68, temperature 98.2 F (36.8 C), resp. rate 16, height 5\' 6"  (1.676 m), weight 73.8 kg, SpO2 100%.   General: No acute distress.  In bed.  Family at bedside. Mood and affect are appropriate Heart: Regular rate and rhythm no rubs murmurs or extra sounds Lungs: Clear to auscultation, breathing unlabored, no rales or wheezes Abdomen: Positive bowel sounds, soft nontender to palpation, nondistended Extremities: No clubbing, cyanosis, or edema Skin: No evidence of breakdown, no evidence of rash. + Discoid lupus plaques throughout, stable   Musculoskeletal: Full range of motion in all 4 extremities. No joint swelling  Neuro:  Alert and oriented x 3. Normal insight and awareness.  speech was clear. Cranial nerve exam unremarkable.  MMT: RUE and RLE 5/5. LUE 4+/5, LLE 3- to 3+/5 prox to distal.  -Right upper and lower extremity moderate ataxia on finger-nose, heel-to-shin Musculoskeletal: Full ROM, No pain with AROM or PROM in the neck, trunk, or extremities.   Assessment/Plan: 1. Functional deficits which require 3+ hours per day of interdisciplinary therapy in a comprehensive inpatient rehab setting. Physiatrist is providing close team supervision and 24 hour management of active medical problems listed below. Physiatrist and rehab team continue to assess  barriers to discharge/monitor patient progress toward functional and medical goals  Care Tool:  Bathing    Body parts bathed by patient: Right arm, Left arm, Chest, Abdomen, Front perineal area, Buttocks, Face, Right upper leg, Left upper leg, Right lower leg, Left lower leg   Body parts bathed by helper: Right lower leg, Left lower leg     Bathing assist Assist Level: Supervision/Verbal cueing     Upper Body Dressing/Undressing Upper body dressing   What is the patient wearing?: Pull over shirt    Upper body assist Assist Level: Independent    Lower Body Dressing/Undressing Lower body dressing      What is the patient wearing?: Pants, Underwear/pull up     Lower body assist Assist for lower body dressing: Contact Guard/Touching assist     Toileting Toileting    Toileting assist Assist for toileting: Supervision/Verbal cueing     Transfers Chair/bed transfer  Transfers assist     Chair/bed transfer assist level: Supervision/Verbal cueing     Locomotion Ambulation   Ambulation assist   Ambulation activity did not occur: Safety/medical concerns (requires use of RW for safe ambulation, pt used SPC at baseline)  Assist level: Contact Guard/Touching assist Assistive device: Walker-rolling Max distance: 60   Walk 10 feet activity   Assist  Assist level: Contact Guard/Touching assist Assistive device: Walker-rolling   Walk 50 feet activity   Assist Walk 50 feet with 2 turns activity did not occur: Safety/medical concerns  Assist level: Contact Guard/Touching assist Assistive device: Walker-rolling    Walk 150 feet activity   Assist Walk 150 feet activity did not occur: Safety/medical concerns         Walk 10 feet on uneven surface  activity   Assist Walk 10 feet on uneven surfaces activity did not occur: Safety/medical concerns         Wheelchair     Assist Is the patient using a wheelchair?: Yes Type of Wheelchair:  Manual    Wheelchair assist level: Minimal Assistance - Patient > 75%, Moderate Assistance - Patient 50 - 74% Max wheelchair distance: 75ft with Min/ModA for maintaining safety    Wheelchair 50 feet with 2 turns activity    Assist        Assist Level: Dependent - Patient 0%   Wheelchair 150 feet activity     Assist      Assist Level: Dependent - Patient 0%   Blood pressure 109/64, pulse 68, temperature 98.2 F (36.8 C), resp. rate 16, height 5\' 6"  (1.676 m), weight 73.8 kg, SpO2 100%.  Medical Problem List and Plan: 1. 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 Formulized individual treatment plan based on medical history, underlying problem and comorbidities.  2.  Antithrombotics: -DVT/anticoagulation:  Pharmaceutical: Lovenox             -antiplatelet therapy: Aspirin 81 mg daily and Plavix 75 mg day x 90 days then aspirin alone 3. Pain Management: Tylenol as needed 4. Mood/Behavior/Sleep: Provide emotional support             -antipsychotic agents: N/A 5. Neuropsych/cognition: This patient is capable of making decisions on her own behalf. 6. Skin/Wound Care: Routine skin checks 7. Fluids/Electrolytes/Nutrition:  recent labs reviewed and stable 8.  HTN, amlodipine and metoprolol well controlled     09/24/2022    5:42 AM 09/23/2022    8:05 PM 09/23/2022    1:49 PM  Vitals with BMI  Systolic 109 127 161  Diastolic 64 68 65  Pulse 68 61 67    9.  Hypothyroidism.  Continue Synthroid 10.  Hyperlipidemia.  Continue Crestor 11.  Tobacco use.  Chantix.   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  Patient use topical medications only if needed prior to admission.  Clobetasol cream bid currently   -otherwise keep affected areas clean  13. Microcytic Anemia: Hgb reviewed and is stable, monitor weekly. Iron panel from April reviewed and is within normal limits  14. Screening for vitamin D deficiency: add on vitamin D level today  15. Underweight: BMI reviewed and is 16.08- continue to monitor and provide dietary education  -pt is eating 70-90% of meals.  -offer protein shakes to boost albumin  16.  COnstipation , hard stools due to Fe supp increased colace to BID  LBM 9/1  17.  Nausea.  Improved .  Discontinued Chantix, last dose was 8/31 LOS: 12 days A FACE TO FACE EVALUATION WAS PERFORMED  Erick Colace 09/24/2022, 7:57 AM

## 2022-09-24 NOTE — Progress Notes (Signed)
Recreational Therapy Session Note  Patient Details  Name: Susan Farrell MRN: [id number] Date of Birth: October 23, 1945 Today's Date: 09/24/2022  Pain: no c/o Skilled Therapeutic Interventions/Progress Updates: Session focused on pt education in regards to community reintegration.  Discussed energy conservation, importance of self awareness/safety, realistic expectations.  Pt ambulated throughout the gift shop with RW with supervision.  Pt also practice furniture transfers with supervision simulating community seating.  Pt is anxious to return home.  Therapy/Group: Co-Treatment   Claud Gowan 09/24/2022, 2:14 PM

## 2022-09-24 NOTE — Progress Notes (Signed)
Physical Therapy Session Note  Patient Details  Name: Susan Farrell MRN: [id number] Date of Birth: 1945/06/06  Today's Date: 09/24/2022 PT Individual Time: 6644-0347 PT Individual Time Calculation (min): 25 min   Short Term Goals: Week 1:  PT Short Term Goal 1 (Week 1): Pt will participate in standardized outcome measure to assess balance and falls risk PT Short Term Goal 1 - Progress (Week 1): Met PT Short Term Goal 2 (Week 1): Pt will perform sit<>stands using LRAD with min A PT Short Term Goal 2 - Progress (Week 1): Met PT Short Term Goal 3 (Week 1): Pt will perform bed<>chair transfers using LRAD with min A PT Short Term Goal 3 - Progress (Week 1): Met PT Short Term Goal 4 (Week 1): Pt will ambulate at least 13ft using LRAD with min A PT Short Term Goal 4 - Progress (Week 1): Progressing toward goal PT Short Term Goal 5 (Week 1): Pt will navigate 4 stairs using HRs per home set-up with min A PT Short Term Goal 5 - Progress (Week 1): Progressing toward goal Week 2:  PT Short Term Goal 1 (Week 2): Pt will ambulate at least 86ft using LRAD with min A. PT Short Term Goal 2 (Week 2): Pt will navigate 4 stairs using HRs per home set-up with min A.  Skilled Therapeutic Interventions/Progress Updates: Patient sitting EOB on entrance to room. Pt alert and agreeable to participate in PT. Pt reported no pain during session. Pt transferred to WC<>EOB with RW taking a few steps with supervision at beginning of session, and at the end. Pt transported to outside of [facility] to ambulate on noncompliant surfaces. Pt with supervision when ambulating roughly 80' on sidewalk that has a steady incline in order to increase gait endurance and strength. Pt with initial cue to increase step clearance on L LE as the incline will require pt to do so in order to avoid catching toe of shoe on the sidewalk. Pt ambulated until fatigue and required a rest break towards the top of the incline (no SOB,  just general LE weakness). Pt also educated on continuing to use RW when d/c home vs hero cane due to decrease in balance and coordination per attending PT assessment.   Patient sitting EOB at end of session with brakes locked, bed alarm set, and all needs within reach.      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

## 2022-09-24 NOTE — Progress Notes (Signed)
Occupational Therapy Discharge Summary  Patient Details  Name: Susan Farrell MRN: [id number] Date of Birth: [birth year]  Date of Discharge from OT service:September 24, 2022     Patient has met 6 of 6 long term goals due to improved activity tolerance, improved balance, postural control, ability to compensate for deficits, functional use of  RIGHT upper and RIGHT lower extremity, and improved coordination.  Patient to discharge at overall Modified Independent level using RW at all times and close supervision with shower stall transfers.    Patient's care partner is independent to provide the necessary physical assistance at discharge.  Spouse declined to come in for education.  Pt education only. Pt provided with handwritten HEP and tip sheet for balance strategy reminders.  Reasons goals not met: n/a  Recommendation:  Patient will benefit from ongoing skilled OT services in outpatient setting to continue to advance functional skills in the area of BADL and iADL.  Equipment: No equipment provided (walker bag provided)  Reasons for discharge: treatment goals met  Patient/family agrees with progress made and goals achieved: Yes  OT Discharge Precautions/Restrictions  Restrictions Weight Bearing Restrictions: No    ADL ADL Eating: Independent Where Assessed-Eating: Edge of bed Grooming: Independent Where Assessed-Grooming: Standing at sink Upper Body Bathing: Independent Where Assessed-Upper Body Bathing: Shower Lower Body Bathing: Modified independent Where Assessed-Lower Body Bathing: Shower Upper Body Dressing: Independent Where Assessed-Upper Body Dressing: Edge of bed Lower Body Dressing: Modified independent Where Assessed-Lower Body Dressing: Edge of bed, Other (Comment) (standing EOB) Toileting: Modified independent Where Assessed-Toileting: Teacher, adult education: Engineer, agricultural Method: Proofreader: Editor, commissioning: Other (comment) (n/a pt does not use a tub at home) Tub/Shower Transfer Method: Stand pivot, Event organiser: Close supervision Film/video editor Method: Ambulating, Stand pivot Astronomer: Shower seat with back Vision Baseline Vision/History: 0 No visual deficits Patient Visual Report: No change from baseline Vision Assessment?: No apparent visual deficits Perception  Perception: Within Functional Limits Praxis Praxis: WFL Cognition Cognition Overall Cognitive Status: Within Functional Limits for tasks assessed Orientation Level: Person;Place;Situation Person: Oriented Place: Oriented Situation: Oriented Safety/Judgment: Appears intact Brief Interview for Mental Status (BIMS) Repetition of Three Words (First Attempt): 3 Temporal Orientation: Year: Correct Temporal Orientation: Month: Accurate within 5 days Temporal Orientation: Day: Correct Recall: "Sock": Yes, no cue required Recall: "Blue": Yes, no cue required Recall: "Bed": Yes, no cue required BIMS Summary Score: 15 Sensation Sensation Light Touch: Appears Intact Hot/Cold: Appears Intact Coordination Gross Motor Movements are Fluid and Coordinated: No Fine Motor Movements are Fluid and Coordinated: No Coordination and Movement Description: impaired due to 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  She needs  supervision with shower stall. Pt states her husband would provide supervision at home as she had weakness from first CVA. Trunk/Postural Assessment  Postural Control Righting Reactions: has improved from admission  and pt can prevent a fall by reaching back to sitting surface or use of a stedy surface such as a grab bar.  When using RW only and ambulating, her righting reactions continue to be delayed  Balance Balance Balance Assessed: Yes Standardized Balance Assessment Standardized Balance Assessment: Berg Balance Test;Timed Up and Go Test Berg Balance Test Sit to Stand: Able to stand  independently using hands Standing Unsupported: Able to stand 2 minutes with supervision Sitting with Back Unsupported but Feet Supported on Floor or Stool: Able to sit safely and securely 2 minutes Stand to Sit: Controls descent by using hands Transfers: Able to transfer safely, definite need of hands Standing Unsupported with Eyes Closed: Able to stand 10 seconds with supervision Standing Ubsupported with Feet Together: Needs help to attain position but able to stand for 30 seconds with feet together From Standing, Reach Forward with Outstretched Arm: Can reach confidently >25 cm (10") From Standing Position, Pick up Object from Floor: Able to pick up shoe, needs supervision From Standing Position, Turn to Look Behind Over each Shoulder: Needs supervision when turning Turn 360 Degrees: Needs close supervision or verbal cueing Standing Unsupported, Alternately Place Feet on Step/Stool: Needs assistance to keep from falling or unable to try Standing Unsupported, One Foot in Front: Able to take small step independently and hold 30 seconds Standing on One Leg: Unable to try or needs assist to prevent fall Total Score: 31 Timed Up and Go Test TUG: Normal TUG Static Sitting Balance Static Sitting - Level of Assistance: 7: Independent Dynamic Sitting Balance Dynamic Sitting - Level of Assistance: 6: Modified  independent (Device/Increase time) Static Standing Balance Static Standing - Level of Assistance: 6: Modified independent (Device/Increase time) Dynamic Standing Balance Dynamic Standing - Balance Support: Left upper extremity supported;During functional activity Dynamic Standing - Level of Assistance: 6: Modified independent (Device/Increase time) (during basic self care using RW for support) Extremity/Trunk Assessment RUE Assessment Active Range of Motion (AROM) Comments: WFL General Strength Comments: 4/5 LUE Assessment Active Range of Motion (AROM) Comments: [facility] General Strength Comments: 4- shoulder, 4 distally   Adell Panek 09/24/2022, 12:31 PM

## 2022-09-25 ENCOUNTER — Other Ambulatory Visit (HOSPITAL_COMMUNITY): Payer: Self-pay

## 2022-09-25 DIAGNOSIS — I63549 Cerebral infarction due to unspecified occlusion or stenosis of unspecified cerebellar artery: Secondary | ICD-10-CM | POA: Diagnosis not present

## 2022-09-25 MED ORDER — CLOPIDOGREL BISULFATE 75 MG PO TABS
75.0000 mg | ORAL_TABLET | Freq: Every day | ORAL | 0 refills | Status: DC
  Filled 2022-09-25: qty 30, 30d supply, fill #0

## 2022-09-25 MED ORDER — METOPROLOL SUCCINATE ER 50 MG PO TB24
50.0000 mg | ORAL_TABLET | Freq: Every day | ORAL | 0 refills | Status: AC
  Filled 2022-09-25: qty 30, 30d supply, fill #0

## 2022-09-25 MED ORDER — ACETAMINOPHEN 325 MG PO TABS: 650.0000 mg | ORAL_TABLET | ORAL | Status: AC | PRN

## 2022-09-25 MED ORDER — DOCUSATE SODIUM 100 MG PO CAPS: 100.0000 mg | ORAL_CAPSULE | Freq: Every day | ORAL | Status: AC

## 2022-09-25 MED ORDER — POLYSACCHARIDE IRON COMPLEX 150 MG PO CAPS
150.0000 mg | ORAL_CAPSULE | Freq: Every day | ORAL | 0 refills | Status: AC
  Filled 2022-09-25: qty 30, 30d supply, fill #0

## 2022-09-25 MED ORDER — ROSUVASTATIN CALCIUM 40 MG PO TABS
40.0000 mg | ORAL_TABLET | Freq: Every day | ORAL | 0 refills | Status: DC
  Filled 2022-09-25: qty 30, 30d supply, fill #0

## 2022-09-25 MED ORDER — VARENICLINE TARTRATE 1 MG PO TABS
1.0000 mg | ORAL_TABLET | Freq: Two times a day (BID) | ORAL | Status: DC
  Filled 2022-09-25: qty 1

## 2022-09-25 MED ORDER — AMLODIPINE BESYLATE 2.5 MG PO TABS
2.5000 mg | ORAL_TABLET | Freq: Every day | ORAL | 0 refills | Status: DC
  Filled 2022-09-25: qty 30, 30d supply, fill #0

## 2022-09-25 MED ORDER — VARENICLINE TARTRATE 0.5 MG PO TABS: 0.5000 mg | ORAL_TABLET | Freq: Two times a day (BID) | ORAL | 0 refills | Status: DC

## 2022-09-25 MED ORDER — VARENICLINE TARTRATE 0.5 MG PO TABS
1.0000 mg | ORAL_TABLET | Freq: Two times a day (BID) | ORAL | 0 refills | Status: DC
Start: 2022-09-25 — End: 2022-09-25
  Filled 2022-09-25: qty 60, 15d supply, fill #0

## 2022-09-25 MED ORDER — VARENICLINE TARTRATE 0.5 MG PO TABS
0.5000 mg | ORAL_TABLET | Freq: Two times a day (BID) | ORAL | 0 refills | Status: DC
Start: 2022-09-25 — End: 2022-09-25
  Filled 2022-09-25: qty 60, 30d supply, fill #0

## 2022-09-25 MED ORDER — VARENICLINE TARTRATE 0.5 MG PO TABS
0.5000 mg | ORAL_TABLET | Freq: Two times a day (BID) | ORAL | Status: DC
  Administered 2022-09-25: 0.5 mg via ORAL
  Filled 2022-09-25: qty 1

## 2022-09-25 MED ORDER — VARENICLINE TARTRATE 1 MG PO TABS: 1.0000 mg | ORAL_TABLET | Freq: Every day | ORAL | Status: DC

## 2022-09-25 MED ORDER — FLUTICASONE PROPIONATE 50 MCG/ACT NA SUSP: 1.0000 | Freq: Every day | NASAL | Status: DC

## 2022-09-25 NOTE — Patient Care Conference (Signed)
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 Claudette Laws Social Worker Present: Lavera Guise, BSW Nurse Present: Chana Bode, RN PT Present: Ralph Leyden, PT OT Present: Primitivo Gauze, OT SLP Present: Feliberto Gottron, SLP PPS Coordinator present : Edson Snowball, Park Breed, SLP     Current Status/Progress Goal Weekly Team Focus  Bowel/Bladder   Patient is continent of bowel and bladder   Remain continent of bowel and bladder   Her bathroom schedule really works for her.  When sleeping, she wakes up oin tome to go to bathroom.  Continue this schedule after discharge.    Swallow/Nutrition/ Hydration               ADL's   continues to need occasional CGA with ambulation,  Supervision with ADLS   Mod I with basic ADLs except for supervision with shower stall transfers   dynamic sit and stand balance and core stability to increase safety with self care tasks, pt education    Mobility   Bed mobility = Mod I, Transfers = sup for balance, Ambulation = supervision/ CGA d/t ataxic RLE during stance phase, stairs = CGA   supervision/ CGA  Barriers: ataxia with RLE WB /// Work On: R sided NMR, standing balance, strengthening including core and hip, family education    Communication                 Safety/Cognition/ Behavioral Observations               Pain   Uses1-10 pain scale and tylenol for pain   teach other methods of pain control such as warmth, cold etc.  Teach avoiding pain spiral.   Remind    Skin   Patient has multiple lupus related lesions on skin.   Continue to be independent with applying temovate cream  ncourrage better nutrition.  Patient doesn't like the foo dserved here.  Educate on using "ensure" and increawsing intake when home.      Discharge Planning:  Patient discharging home on Thursday with support from husband, family and church members. Spouse declined family education. Patient has all DME. No barriers.   Team Discussion: Patient with ataxia post cerebellar infarct.  Nausea improved post discontinue of Chantix. Ready for discharge.  Patient on target to meet rehab goals: yes, currently mod I for self care. Needs supervision to step into a shower. Mod I - supervision for balance and CGA for ambulation. Continue to note right UE ataxia with weight bearing.   *See Care Plan and progress notes for long and short-term goals.   Revisions to Treatment Plan:  Handicapped sticker application   Teaching Needs: Safety, medications, transfers, toileting, etc.   Current Barriers to Discharge: Decreased caregiver support  Possible Resolutions to Barriers: Family education completed OP follow up services  Medical Summary Current Status: hemiataxia persists, balance improving , good BP control  Barriers to Discharge: Other (comments)  Barriers to Discharge Comments: Chronic LLE weakness prior CVA Possible Resolutions to Barriers/Weekly Focus: complete family training , plan D/C in am   Continued Need for Acute Rehabilitation Level of Care: The patient requires daily medical management by a physician with specialized training in physical medicine and rehabilitation for the following reasons: Direction of a multidisciplinary physical  rehabilitation program to maximize functional independence : Yes Medical management of patient stability for increased activity during participation in an intensive rehabilitation regime.: Yes Analysis of laboratory values and/or radiology reports with any subsequent need for medication adjustment and/or medical intervention. : Yes   I attest that I was present, lead the team conference, and concur with the assessment and plan of the team.   Chana Bode B 09/25/2022, 1:20 PM

## 2022-09-25 NOTE — Progress Notes (Signed)
PROGRESS NOTE   Subjective/Complaints: .   ROS: Patient denies CP, SOB, abd pain.    Objective:   No results found. No results for input(s): "WBC", "HGB", "HCT", "PLT" in the last 72 hours.  No results for input(s): "NA", "K", "CL", "CO2", "GLUCOSE", "BUN", "CREATININE", "CALCIUM" in the last 72 hours.   Intake/Output Summary (Last 24 hours) at 09/25/2022 0758 Last data filed at 09/24/2022 1821 Gross per 24 hour  Intake 476 ml  Output --  Net 476 ml        Physical Exam: Vital Signs Blood pressure 133/78, pulse 74, temperature 97.9 F (36.6 C), resp. rate 16, height 5\' 6"  (1.676 m), weight 73.8 kg, SpO2 100%.   General: No acute distress.  In bed.  Family at bedside. Mood and affect are appropriate Heart: Regular rate and rhythm no rubs murmurs or extra sounds Lungs: Clear to auscultation, breathing unlabored, no rales or wheezes Abdomen: Positive bowel sounds, soft nontender to palpation, nondistended Extremities: No clubbing, cyanosis, or edema Skin: No evidence of breakdown, no evidence of rash. + Discoid lupus plaques throughout, stable   Musculoskeletal: Full range of motion in all 4 extremities. No joint swelling  Neuro:  Alert and oriented x 3. Normal insight and awareness.  speech was clear. Cranial nerve exam unremarkable.  MMT: RUE and RLE 5/5. LUE 4+/5, LLE 3- to 3+/5 prox to distal.  -Right upper and lower extremity moderate ataxia on finger-nose, heel-to-shin Musculoskeletal: Full ROM, No pain with AROM or PROM in the neck, trunk, or extremities.   Assessment/Plan: 1. Functional deficits Right cerebellar infarct Stable for D/C today F/u PCP in 3-4 weeks F/u PM&R 2 weeks See D/C summary See D/C instructions  Care Tool:  Bathing    Body parts bathed by patient: Right arm, Left arm, Chest, Abdomen, Front perineal area, Buttocks, Face, Right upper leg, Left upper leg, Right lower leg, Left lower  leg   Body parts bathed by helper: Right lower leg, Left lower leg     Bathing assist Assist Level: Independent with assistive device     Upper Body Dressing/Undressing Upper body dressing   What is the patient wearing?: Pull over shirt    Upper body assist Assist Level: Independent    Lower Body Dressing/Undressing Lower body dressing      What is the patient wearing?: Pants, Underwear/pull up     Lower body assist Assist for lower body dressing: Independent with assitive device     Toileting Toileting    Toileting assist Assist for toileting: Independent with assistive device     Transfers Chair/bed transfer  Transfers assist     Chair/bed transfer assist level: Supervision/Verbal cueing     Locomotion Ambulation   Ambulation assist   Ambulation activity did not occur: Safety/medical concerns (requires use of RW for safe ambulation, pt used SPC at baseline)  Assist level: Contact Guard/Touching assist Assistive device: Walker-rolling Max distance: 170 ft   Walk 10 feet activity   Assist     Assist level: Supervision/Verbal cueing Assistive device: Walker-rolling   Walk 50 feet activity   Assist Walk 50 feet with 2 turns activity did not occur: Safety/medical concerns  Assist level: Contact Guard/Touching assist Assistive device: Walker-rolling    Walk 150 feet activity   Assist Walk 150 feet activity did not occur: Safety/medical concerns  Assist level: Contact Guard/Touching assist Assistive device: Walker-rolling    Walk 10 feet on uneven surface  activity   Assist Walk 10 feet on uneven surfaces activity did not occur: Safety/medical concerns   Assist level: Supervision/Verbal cueing Assistive device: Walker-rolling   Wheelchair     Assist Is the patient using a wheelchair?: Yes Type of Wheelchair: Manual    Wheelchair assist level: Contact Guard/Touching assist Max wheelchair distance: 80 ft    Wheelchair 50  feet with 2 turns activity    Assist        Assist Level: Contact Guard/Touching assist   Wheelchair 150 feet activity     Assist      Assist Level: Maximal Assistance - Patient 25 - 49%   Blood pressure 133/78, pulse 74, temperature 97.9 F (36.6 C), resp. rate 16, height 5\' 6"  (1.676 m), weight 73.8 kg, SpO2 100%.  Medical Problem List and Plan: 1. 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  Antithrombotics: -DVT/anticoagulation:  Pharmaceutical: Lovenox             -antiplatelet therapy: Aspirin 81 mg daily and Plavix 75 mg day x 90 days then aspirin alone 3. Pain Management: Tylenol as needed 4. Mood/Behavior/Sleep: Provide emotional support             -antipsychotic agents: N/A 5. Neuropsych/cognition: This patient is capable of making decisions on her own behalf. 6. Skin/Wound Care: Routine skin checks 7. Fluids/Electrolytes/Nutrition:  recent labs reviewed and stable 8.  HTN, amlodipine and metoprolol well controlled     09/25/2022    4:29 AM 09/24/2022    7:38 PM 09/24/2022    2:05 PM  Vitals with BMI  Systolic 133 131 191  Diastolic 78 69 58  Pulse 74 61 58    9.  Hypothyroidism.  Continue Synthroid 10.  Hyperlipidemia.  Continue Crestor 11.  Tobacco use.  Chantix.  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  Patient use topical medications only if needed prior to admission.  Clobetasol cream bid currently   -otherwise keep affected areas clean  13. Microcytic Anemia: Hgb reviewed and is stable, monitor weekly. Iron panel from April reviewed and is within normal limits  14. Screening for vitamin D deficiency: add on vitamin D level today  15. Underweight: BMI  reviewed and is 16.08- continue to monitor and provide dietary education  -pt is eating 70-90% of meals.  -offer protein shakes to boost albumin  16.  COnstipation , hard stools due to Fe supp increased colace to BID  LBM 9/1  17.  Nausea.  Improved .  Discontinued Chantix, last dose was 9/4 LOS: 13 days A FACE TO FACE EVALUATION WAS PERFORMED  Erick Colace 09/25/2022, 7:58 AM

## 2022-09-25 NOTE — Progress Notes (Signed)
Recreational Therapy Discharge Summary Patient Details  Name: Susan Farrell MRN: [id number] Date of Birth: Dec 29, 1945 Today's Date: 09/25/2022  Comments on progress toward goals: Pt has made good progress during LOS and is excited to discharge home today at supervision ambulatory level with her husband to provide supervision.  TR sessions focused on pt education in regards to leisure eduction, activity analysis/modifications, community reintegration, energy conservation and discharge planning.  Pt is anxious to return home and return to previously enjoyed leisure activities.  Reasons for discharge: discharge from hospital  Follow-up: Outpatient  Patient/family agrees with progress made and goals achieved: Yes  Abeer Iversen 09/25/2022, 9:17 AM

## 2022-09-25 NOTE — Plan of Care (Signed)
  Problem: RH Balance Goal: LTG: Patient will maintain dynamic sitting balance (OT) Description: LTG:  Patient will maintain dynamic sitting balance with assistance during activities of daily living (OT) Outcome: Completed/Met   Problem: Sit to Stand Goal: LTG:  Patient will perform sit to stand in prep for activites of daily living with assistance level (OT) Description: LTG:  Patient will perform sit to stand in prep for activites of daily living with assistance level (OT) Outcome: Completed/Met   Problem: RH Dressing Goal: LTG Patient will perform lower body dressing w/assist (OT) Description: LTG: Patient will perform lower body dressing with assist, with/without cues in positioning using equipment (OT) Outcome: Completed/Met   Problem: RH Toilet Transfers Goal: LTG Patient will perform toilet transfers w/assist (OT) Description: LTG: Patient will perform toilet transfers with assist, with/without cues using equipment (OT) Outcome: Completed/Met   Problem: RH Tub/Shower Transfers Goal: LTG Patient will perform tub/shower transfers w/assist (OT) Description: LTG: Patient will perform tub/shower transfers with assist, with/without cues using equipment (OT) Outcome: Completed/Met   Problem: RH Balance Goal: LTG: Patient will maintain dynamic sitting balance (OT) Description: LTG:  Patient will maintain dynamic sitting balance with assistance during activities of daily living (OT) Outcome: Completed/Met Goal: LTG Patient will maintain dynamic standing with ADLs (OT) Description: LTG:  Patient will maintain dynamic standing balance with assist during activities of daily living (OT)  Outcome: Completed/Met   Problem: Sit to Stand Goal: LTG:  Patient will perform sit to stand in prep for activites of daily living with assistance level (OT) Description: LTG:  Patient will perform sit to stand in prep for activites of daily living with assistance level (OT) Outcome: Completed/Met    Problem: RH Dressing Goal: LTG Patient will perform lower body dressing w/assist (OT) Description: LTG: Patient will perform lower body dressing with assist, with/without cues in positioning using equipment (OT) Outcome: Completed/Met   Problem: RH Toileting Goal: LTG Patient will perform toileting task (3/3 steps) with assistance level (OT) Description: LTG: Patient will perform toileting task (3/3 steps) with assistance level (OT)  Outcome: Completed/Met   Problem: RH Toilet Transfers Goal: LTG Patient will perform toilet transfers w/assist (OT) Description: LTG: Patient will perform toilet transfers with assist, with/without cues using equipment (OT) Outcome: Completed/Met   Problem: RH Tub/Shower Transfers Goal: LTG Patient will perform tub/shower transfers w/assist (OT) Description: LTG: Patient will perform tub/shower transfers with assist, with/without cues using equipment (OT) Outcome: Completed/Met

## 2022-09-25 NOTE — Progress Notes (Signed)
Inpatient Rehabilitation Discharge Medication Review by a Pharmacist  A complete drug regimen review was completed for this patient to identify any potential clinically significant medication issues.  High Risk Drug Classes Is patient taking? Indication by Medication  Antipsychotic No   Anticoagulant No   Antibiotic No   Opioid No   Antiplatelet Yes Aspirin, plavix- cva ppx x 90 days (end date for plavix 12/08/2022)  Hypoglycemics/insulin No   Vasoactive Medication Yes Norvasc, Toprol- HTN  Chemotherapy No   Other Yes Crestor- HLD Synthroid- hypothyroidism Chantix- smoke cessation     Type of Medication Issue Identified Description of Issue Recommendation(s)  Drug Interaction(s) (clinically significant)     Duplicate Therapy     Allergy     No Medication Administration End Date     Incorrect Dose     Additional Drug Therapy Needed     Significant med changes from prior encounter (inform family/care partners about these prior to discharge).    Other       Clinically significant medication issues were identified that warrant physician communication and completion of prescribed/recommended actions by midnight of the next day:  No   Time spent performing this drug regimen review (minutes):  30   Judi Jaffe BS, PharmD, BCPS Clinical Pharmacist 09/25/2022 7:18 AM  Contact: [phone] after 3 PM  "Be curious, not judgmental..." -Debbora Dus

## 2022-09-25 NOTE — Plan of Care (Signed)
  Problem: RH Balance Goal: LTG Patient will maintain dynamic sitting balance (PT) Description: LTG:  Patient will maintain dynamic sitting balance with assistance during mobility activities (PT) Flowsheets (Taken 09/13/2022 1231 by Ginny Forth, PT) LTG: Pt will maintain dynamic sitting balance during mobility activities with:: Independent with assistive device  Goal: LTG Patient will maintain dynamic standing balance (PT) Description: LTG:  Patient will maintain dynamic standing balance with assistance during mobility activities (PT) Flowsheets (Taken 09/13/2022 1231 by Ginny Forth, PT) LTG: Pt will maintain dynamic standing balance during mobility activities with:: Contact Guard/Touching assist   Problem: Sit to Stand Goal: LTG:  Patient will perform sit to stand with assistance level (PT) Description: LTG:  Patient will perform sit to stand with assistance level (PT) Flowsheets (Taken 09/13/2022 1231 by Ginny Forth, PT) LTG: PT will perform sit to stand in preparation for functional mobility with assistance level: Supervision/Verbal cueing   Problem: RH Bed Mobility Goal: LTG Patient will perform bed mobility with assist (PT) Description: LTG: Patient will perform bed mobility with assistance, with/without cues (PT). Flowsheets (Taken 09/13/2022 1231 by Ginny Forth, PT) LTG: Pt will perform bed mobility with assistance level of: Independent with assistive device    Problem: RH Bed to Chair Transfers Goal: LTG Patient will perform bed/chair transfers w/assist (PT) Description: LTG: Patient will perform bed to chair transfers with assistance (PT). Flowsheets (Taken 09/13/2022 1231 by Ginny Forth, PT) LTG: Pt will perform Bed to Chair Transfers with assistance level: Supervision/Verbal cueing   Problem: RH Car Transfers Goal: LTG Patient will perform car transfers with assist (PT) Description: LTG: Patient will perform car transfers with assistance (PT). Flowsheets  (Taken 09/13/2022 1231 by Ginny Forth, PT) LTG: Pt will perform car transfers with assist:: Supervision/Verbal cueing   Problem: RH Ambulation Goal: LTG Patient will ambulate in controlled environment (PT) Description: LTG: Patient will ambulate in a controlled environment, # of feet with assistance (PT). Flowsheets (Taken 09/13/2022 1231 by Ginny Forth, PT) LTG: Pt will ambulate in controlled environ  assist needed:: Contact Guard/Touching assist LTG: Ambulation distance in controlled environment: 125ft using LRAD Goal: LTG Patient will ambulate in home environment (PT) Description: LTG: Patient will ambulate in home environment, # of feet with assistance (PT). Flowsheets (Taken 09/13/2022 1231 by Ginny Forth, PT) LTG: Pt will ambulate in home environ  assist needed:: Contact Guard/Touching assist LTG: Ambulation distance in home environment: 20ft using LRAD   Problem: RH Stairs Goal: LTG Patient will ambulate up and down stairs w/assist (PT) Description: LTG: Patient will ambulate up and down # of stairs with assistance (PT) Flowsheets (Taken 09/13/2022 1231 by Ginny Forth, PT) LTG: Pt will ambulate up/down stairs assist needed:: Contact Guard/Touching assist LTG: Pt will  ambulate up and down number of stairs: 4 steps using HRs per home set-up

## 2022-09-29 ENCOUNTER — Telehealth: Payer: Self-pay

## 2022-09-29 NOTE — Telephone Encounter (Addendum)
Transitional Care Call--who you spoke with [name]   Are you/is patient experiencing any problems since coming home? None  Are there any questions regarding any aspect of care? None. Are there any questions regarding medications administration/dosing?No  Are meds being taken as prescribed? Yes.  Patient should review meds with caller to confirm. Medications reviewed.  Have there been any falls? No Has Home Health been to the house and/or have they contacted you?Yes. If not, have you tried to contact them? No need. Can we help you contact them?  No need appointment already set up. Are bowels and bladder emptying properly? Yes. Are there any unexpected incontinence issues? None. If applicable, is patient following bowel/bladder programs? N/A Any fevers, problems with breathing, unexpected pain? No Are there any skin problems or new areas of breakdown? None Has the patient/family member arranged specialty MD follow up (ie cardiology/neurology/renal/surgical/etc)? Yes.   Can we help arrange? No need.  Does the patient need any other services or support that we can help arrange? No.  Are caregivers following through as expected in assisting the patient? Yes.         11. Has the patient quit smoking, drinking alcohol, or using drugs as recommended? Patient has been advised.    Appointment  on 10/08/2022 with Jacalyn Lefevre NP at 1:20 pm, [address] [employer] [address]

## 2022-09-29 NOTE — Telephone Encounter (Signed)
done

## 2022-10-01 ENCOUNTER — Ambulatory Visit: Payer: Medicare HMO | Attending: Physician Assistant | Admitting: Occupational Therapy

## 2022-10-01 ENCOUNTER — Ambulatory Visit: Payer: Medicare HMO

## 2022-10-01 DIAGNOSIS — I63549 Cerebral infarction due to unspecified occlusion or stenosis of unspecified cerebellar artery: Secondary | ICD-10-CM | POA: Diagnosis not present

## 2022-10-01 DIAGNOSIS — R278 Other lack of coordination: Secondary | ICD-10-CM | POA: Diagnosis not present

## 2022-10-01 DIAGNOSIS — M6281 Muscle weakness (generalized): Secondary | ICD-10-CM

## 2022-10-01 DIAGNOSIS — R2689 Other abnormalities of gait and mobility: Secondary | ICD-10-CM | POA: Insufficient documentation

## 2022-10-01 DIAGNOSIS — R262 Difficulty in walking, not elsewhere classified: Secondary | ICD-10-CM

## 2022-10-01 DIAGNOSIS — R269 Unspecified abnormalities of gait and mobility: Secondary | ICD-10-CM | POA: Diagnosis not present

## 2022-10-01 DIAGNOSIS — I639 Cerebral infarction, unspecified: Secondary | ICD-10-CM

## 2022-10-01 NOTE — Therapy (Signed)
OUTPATIENT PHYSICAL THERAPY EVALUATION    Patient Name: Susan Farrell MRN: [id number] DOB:1945-03-07, 77 y.o., female Today's Date: 10/01/2022   PCP: Barbette Reichmann, MD REFERRING PROVIDER: Charlton Amor, PA-C Purpose for referral, evaluation, treatment: Rehabilitation.   PT End of Session - 10/01/22 1038     Visit Number 1    Number of Visits 16    Date for PT Re-Evaluation 11/26/22    Authorization Type [employer]    Authorization Time Period 10/01/22-11/26/22    Progress Note Due on Visit 10    PT Start Time 1015    PT Stop Time 1055    PT Time Calculation (min) 40 min    Equipment Utilized During Treatment Gait belt    Activity Tolerance Patient tolerated treatment well;No increased pain    Behavior During Therapy [facility] for tasks assessed/performed                Past Medical History:  Diagnosis Date   Hypertension    Stroke [facility])    Past Surgical History:  Procedure Laterality Date   ABDOMINAL HYSTERECTOMY     CHOLECYSTECTOMY     ENDOVASCULAR REPAIR/STENT GRAFT N/A 06/12/2020   Procedure: ENDOVASCULAR REPAIR/STENT GRAFT;  Surgeon: Renford Dills, MD;  Location: [facility] INVASIVE CV LAB;  Service: Cardiovascular;  Laterality: N/A;     ONSET DATE: 08/28/21  REFERRING DIAG: I63.81 (ICD-10-CM) - 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)  THERAPY DIAG:  Muscle weakness (generalized)  Other lack of coordination  Cerebellar stroke, acute (HCC)  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    PERTINENT HISTORY:   Susan Farrell sustained a cerebelar CVA in August 2024, rapid progression in symptom improved over first week, DC acute to CIR, then home ot son's house for 5 days and finally back ot her house 2 days prior to this eval. Pt has been using RW at home since DC, no rollator use yet.   PAIN:  Are you having pain? No  PRECAUTIONS: Fall  WEIGHT BEARING RESTRICTIONS No  FALLS: Has patient fallen in last 6 months? No   LIVING ENVIRONMENT: Lives with: lives with their family and lives  with their partner Lives in: House/apartment Stairs: Yes: External: 1 steps; none Has following equipment at home: Walker - 2 wheeled, Walker - 4 wheeled, bed side commode, and only uses bed side commode at night   PLOF: Independent, Independent with basic ADLs, Independent with household mobility without device, Independent with community mobility without device, Independent with gait, and Leisure: going to church, going to grocery and department stores.   PATIENT GOALS Get back to normal walking and balance without any support.   OBJECTIVE:   FUNCTIONAL TESTs:  5 times sit to stand: 19 sec hands on knees   Last years 6 minute walk test: 234 feet and requires seated rest break due to lower extremity fatigue on the left lower extremity  Last years Berg Balance Scale: 36  PATIENT SURVEYS:  FOTO 42  TODAY'S TREATMENT:  -167ft AMB, RW, Left AFO -5xSTS hands on knees from lowest vestibular table  -115ft AMB, RW, Left AFO -5xSTS hands on knees from lowest vestibular table  -174ft AMB, RW, Left AFO -5xSTS hands on knees from lowest vestibular table  *STS transfers performed at supervision level    PATIENT EDUCATION: Education details: POC Person educated: Patient Education method: Explanation Education comprehension: verbalized understanding   HOME EXERCISE PROGRAM:  *still working on [employer], will reassess and update next time.     GOALS:  Goals  reviewed with patient? Yes  SHORT TERM GOALS: Target date: 10/29/22  Patient will be independent in updated home exercise program to improve strength/mobility for better functional independence with ADLs. Baseline:  eval: still working on CIR HEP  Goal status: INITIAL  LONG TERM GOALS: Target date: 11/26/22 Patient will improve FOTO score by >14 to indicate subjective reduction in difficulty performing ADL/IADL tasks/mobility.  Baseline: eval: 42 Goal status: INITIAL   2.  Patient will demonstrate 5xSTS improvement in time and use of UE support to indicate improved power, strength, motor control, and reduced pain/dizziness.  Baseline eval: 19sec Goal status: INITIAL   3.   The pt will improve to >0.56m/s to improve ability to safely participate in typical work and/or leisure  Baseline: eval: 24.83sec c RW  Goal status: INITIAL   4.  Patient will improve score objective balance assessment by > MCID to indicate reduced falls risk and improved safety at home.   Baseline: pending visit  Goal status: INITIAL    ASSESSMENT: Pt presents for evaluation post CVA, DC to home from CIR 6 days prior. Exam revealing of impairment of balance, activity tolerance, motor control, limits safety and independence in ADL and IADL. Pt not back to driving nor using baseline assistive device. PT services can address these problems and make better.      OBJECTIVE IMPAIRMENTS Abnormal gait, decreased activity tolerance, decreased balance, decreased coordination, decreased endurance, decreased mobility, difficulty walking, and decreased strength.   ACTIVITY LIMITATIONS lifting, bending, standing, squatting, stairs, dressing, and caring for others  PARTICIPATION LIMITATIONS: meal prep, cleaning, driving, shopping, yard work, and church  PERSONAL FACTORS Age are also affecting patient's functional outcome.   REHAB POTENTIAL: Good  CLINICAL DECISION MAKING: Evolving/moderate complexity  EVALUATION  COMPLEXITY: Moderate  PLAN: PT FREQUENCY: 2x/week  PT DURATION: 8 weeks  PLANNED INTERVENTIONS: Therapeutic exercises, Therapeutic activity, Neuromuscular re-education, Balance training, Gait training, Patient/Family education, Self Care, Joint mobilization, Dry Needling, and Manual therapy  PLAN FOR NEXT SESSION:   10:41 AM, 10/01/22 [name], PT, DPT Physical Therapist - [facility]  Outpatient Physical Therapy-  [employer] [phone]

## 2022-10-01 NOTE — Therapy (Signed)
OUTPATIENT OCCUPATIONAL THERAPY NEURO EVALUATION  Patient Name: Susan Farrell MRN: [id number] DOB:September 10, 1945, 77 y.o., female Today's Date: 10/01/2022  PCP: Dr. Marcello Fennel REFERRING PROVIDER: Mariam Dollar, [place]  END OF SESSION:  OT End of Session - 10/01/22 0933     Visit Number 1    Number of Visits 24    Date for OT Re-Evaluation 12/24/22    OT Start Time 0930    OT Stop Time 1015    OT Time Calculation (min) 45 min    Activity Tolerance Patient tolerated treatment well;No increased pain    Behavior During Therapy [facility] for tasks assessed/performed             Past Medical History:  Diagnosis Date   Hypertension    Stroke [facility])    Past Surgical History:  Procedure Laterality Date   ABDOMINAL HYSTERECTOMY     CHOLECYSTECTOMY     ENDOVASCULAR REPAIR/STENT GRAFT N/A 06/12/2020   Procedure: ENDOVASCULAR REPAIR/STENT GRAFT;  Surgeon: Renford Dills, MD;  Location: [facility] INVASIVE CV LAB;  Service: Cardiovascular;  Laterality: N/A;   Patient Active Problem List   Diagnosis Date Noted   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) 09/12/2022   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) 09/09/2022   History of abdominal aortic aneurysm (AAA) repair 09/09/2022   HTN (hypertension) 09/09/2022   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   Anemia of chronic disease 09/09/2022   Tobacco abuse 09/09/2022   Thyroid nodule 09/09/2022   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) 09/02/2021   Hypertensive urgency 08/29/2021   Dyslipidemia 08/29/2021   Hypothyroidism 08/29/2021   Cerebellar stroke, acute (HCC) 08/28/2021   Ruptured abdominal aortic aneurysm (AAA) (HCC) 06/12/2020   Goiter 09/13/2019   Osteopenia 09/13/2019    ONSET DATE: 09/08/2022  REFERRING DIAG: Cerebellar CVA  THERAPY DIAG:  Muscle weakness (generalized)  Other lack of coordination  Rationale for Evaluation and Treatment:  Rehabilitation  SUBJECTIVE:   SUBJECTIVE STATEMENT:  Pt. reports that she has just returned to her home, as her husband had COVID-19 when she was discharged form rehab. Pt accompanied by: self  PERTINENT HISTORY:   Pt. Is a 77 y.o. female who had a Cerbellar CVA, Occlusion of the right cerebellar artery. 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 Pt. 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 No  FALLS: Has patient fallen in last 6 months? No  LIVING ENVIRONMENT: Lives with: lives with their spouse Lives in: House/apartment, one story home Stairs: Yes: External: 1 steps; none Has following equipment at home: Counselling psychologist, Environmental consultant - 2 wheeled, Environmental consultant - 4 wheeled, and bed side commode  PLOF: Independent, driving  PATIENT GOALS: To have her right arm be more steady  OBJECTIVE:   HAND DOMINANCE: Right  ADLs: Transfers/ambulation related to ADLs: Eating: independent with the left hand, has difficulty using dominant right hand 2/2 incoordination Grooming: Independent UB Dressing: Independent, increased time LB Dressing: Independent, increased time Toileting: Independent Bathing: Supervision, uses shower seat Tub Shower transfers: Supervision   IADLs: Shopping: Family-moved back home yesterday 2/2 Husband having COVID when Pt. D/d's from inpatient rehab Light housekeeping: Independent Meal Prep: Has not had a chance to perform meal prep yet. Community mobility: Relies  on family Medication management: independent Financial management: Husband assist 2/2  difficulty with writing checks. Handwriting: 25% legible  MOBILITY STATUS: Needs Assist: uses a RW    ACTIVITY TOLERANCE: Activity tolerance: Fatigues after greater than 30 min. Of activity  FUNCTIONAL OUTCOME  MEASURES:  FOTO: 67, TR score: 79  MAM-20: Sum score: 69/80:  Items scored 2 indicated as very hard to do:  Cutting nails, cutting meat, writing 3-4 sentences legibly, picking up a 1/2 full water pitcher  Items scored: as 3 indicated as a little hard to do:  Opening a wide mouth jar, using a fork/spoon, dialing/keying in phone numbers    UPPER EXTREMITY ROM:    Active ROM Right Eval WNL Left Eval WNL  Shoulder flexion    Shoulder abduction    Shoulder adduction    Shoulder extension    Shoulder internal rotation    Shoulder external rotation    Elbow flexion    Elbow extension    Wrist flexion    Wrist extension    Wrist ulnar deviation    Wrist radial deviation    Wrist pronation    Wrist supination    (Blank rows = not tested)  UPPER EXTREMITY MMT:     MMT Right eval Left eval  Shoulder flexion 5/5 5/5  Shoulder abduction 5/5 5/5  Shoulder adduction    Shoulder extension    Shoulder internal rotation    Shoulder external rotation    Middle trapezius    Lower trapezius    Elbow flexion 5/5 5/5  Elbow extension 5/5 5/5  Wrist flexion    Wrist extension 5/5 5/5  Wrist ulnar deviation    Wrist radial deviation    Wrist pronation    Wrist supination    (Blank rows = not tested)  HAND FUNCTION: Grip strength: Right: 43 lbs; Left: 45 lbs, Lateral pinch: Right: 15 lbs, Left: 11 lbs, and 3 point pinch: Right: 9 lbs, Left: 8 lbs  COORDINATION: 9 Hole Peg test: Right: 54 sec. With multiple pegs dropped. Left: 32 sec  SENSATION: Light touch: WFL Proprioception: WFL  EDEMA: N/A  MUSCLE TONE: Intact  COGNITION: Overall cognitive status: Within functional limits for tasks assessed  VISION: Subjective report:  No vision changes Baseline vision: Wears glasses for reading only   PERCEPTION: WFL  PRAXIS: WFL   TODAY'S TREATMENT:                                                                                                                               DATE: 10/01/2022   Evaluation completed   PATIENT EDUCATION: Education details: OT POC, and goals Person educated: Patient Education method: Medical illustrator Education comprehension: verbalized understanding, returned demonstration, and needs further education  HOME EXERCISE PROGRAM:  Continue to assess needs, and provide HEPs as indicated.   GOALS: Goals reviewed with patient? Yes  SHORT TERM GOALS: Target date: 11/12/2022     Pt. Will be independent with HEPs for the RUE hand function Baseline: EVal: No  current HEPs Goal status: INITIAL   LONG TERM GOALS: Target date: 12/24/2022    Pt. Will improve MAM-20 sum score by 5 points for improved hand function Baseline: Eval: MAM-20 Sum score: 69/80 Goal status: INITIAL  2.  Pt. will increase FOTO score by 2 points to reflect Pt. perceived improvement with assessment specific ADL/IADL's.  Baseline: Eval: FOTO score: 67 Goal status: INITIAL  3.  Pt. Will be able to write 1  sentence with 75% legibility  in preparation for written correspondence Baseline: Eval: Name only 25% legibility Goal status: INITIAL  4.  Pt. Will improve right grip strength by 5# to be able to securely hold items without dropping them Baseline: Eval: Right: 43#, Left: 45# Goal status: INITIAL  5.  Pt. Will improve right hand [facility] skills by 3 sec. On the 9-hole peg test to be able to manipulate small objects Baseline: Eval: Right: 54 sec. With dropping multiple pegs, L: 32sec. Goal status: INITIAL  6.  Pt. Will be independently, and efficiently using her right hand to use utensils to cut, and eat her food Baseline:  Eval: Pt. Presents with difficulty using her right hand to use utensils. Goal status: INITIAL  ASSESSMENT:  CLINICAL IMPRESSION:  Patient is a 77 y.o. female who was seen today for occupational therapy evaluation for  a cerebellar CVA. Pt. presents with decreased dominant right hand grip strength, and impaired fine  motor coordination skills which hinders her ability to securely hold objects in the right hand, manipulate small objects, write legibly, dial cell phone numbers, and handle utensils when cutting food, and performing hand to mouth patterns. FOTO score is 67. Pt. will benefit from OT services for ADL training, A/E training, there. Ex., neuromuscular re-education, and pt. Education.  PERFORMANCE DEFICITS: in functional skills including ADLs, IADLs, coordination, dexterity, and strength.   IMPAIRMENTS: are limiting patient from ADLs, IADLs, leisure, and social participation.   CO-MORBIDITIES: may have co-morbidities  that affects occupational performance. Patient will benefit from skilled OT to address above impairments and improve overall function.  MODIFICATION OR ASSISTANCE TO COMPLETE EVALUATION: Min-Moderate modification of tasks or assist with assess necessary to complete an evaluation.  OT OCCUPATIONAL PROFILE AND HISTORY: Detailed assessment: Review of records and additional review of physical, cognitive, psychosocial history related to current functional performance.  CLINICAL DECISION MAKING: Moderate - several treatment options, min-mod task modification necessary  REHAB POTENTIAL: Good  EVALUATION COMPLEXITY: Moderate    PLAN:  OT FREQUENCY: 2x/week  OT DURATION: 12 weeks  PLANNED INTERVENTIONS: self care/ADL training, therapeutic exercise, therapeutic activity, neuromuscular re-education, manual therapy, functional mobility training, paraffin, moist heat, contrast bath, and patient/family education  RECOMMENDED OTHER SERVICES: PT  CONSULTED AND AGREED WITH PLAN OF CARE: Patient  PLAN FOR NEXT SESSION: To initiate treatment   Olegario Messier, MS, OTR/L 10/01/2022, 9:40 AM

## 2022-10-06 NOTE — Therapy (Signed)
OUTPATIENT PHYSICAL THERAPY Treatment     Patient Name: Susan Farrell MRN: [id number] DOB:[birth year], 77 y.o., female Today's Date: 10/07/2022   PCP: Barbette Reichmann, MD REFERRING PROVIDER: Charlton Amor, PA-C Purpose for referral, evaluation, treatment: Rehabilitation.   PT End of Session - 10/07/22 1015     Visit Number 2    Number of Visits 16    Date for PT Re-Evaluation 11/26/22    Authorization Type [employer]    Authorization Time Period 10/01/22-11/26/22    Progress Note Due on Visit 10    PT Start Time 1015    PT Stop Time 1058    PT Time Calculation (min) 43 min    Equipment Utilized During Treatment Gait belt    Activity Tolerance Patient tolerated treatment well;No increased pain    Behavior During Therapy [facility] for tasks assessed/performed                 Past Medical History:  Diagnosis Date   Hypertension    Stroke [facility])    Past Surgical History:  Procedure Laterality Date   ABDOMINAL HYSTERECTOMY     CHOLECYSTECTOMY     ENDOVASCULAR REPAIR/STENT GRAFT N/A 06/12/2020   Procedure: ENDOVASCULAR REPAIR/STENT GRAFT;  Surgeon: Renford Dills, MD;  Location: [facility] INVASIVE CV LAB;  Service: Cardiovascular;  Laterality: N/A;     ONSET DATE: 08/28/21  REFERRING DIAG: I63.81 (ICD-10-CM) - 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 No pain.   PERTINENT HISTORY:   Susan Farrell sustained a cerebelar CVA in  August 2024, rapid progression in symptom improved over first week, DC acute to CIR, then home ot son's house for 5 days and finally back ot her house 2 days prior to this eval. Pt has been using RW at home since DC, no rollator use yet.   PAIN:  Are you having pain? No  PRECAUTIONS: Fall  WEIGHT BEARING RESTRICTIONS No  FALLS: Has patient fallen in last 6 months? No   LIVING ENVIRONMENT: Lives with: lives with their family and lives with their partner Lives in: House/apartment Stairs: Yes: External: 1 steps; none  Has following equipment at home: Dan Humphreys - 2 wheeled, Walker - 4 wheeled, bed side commode, and only uses bed side commode at night   PLOF: Independent, Independent with basic ADLs, Independent with household mobility without device, Independent with community mobility without device, Independent with gait, and Leisure: going to church, going to grocery and department stores.   PATIENT GOALS Get back to normal walking and balance without any support.   OBJECTIVE:   FUNCTIONAL TESTs:  5 times sit to stand: 19 sec hands on knees   Last years 6 minute walk test: 234 feet and requires seated rest break due to lower extremity fatigue on the left lower extremity  Last years Berg Balance Scale: 36  PATIENT SURVEYS:  FOTO 42  TODAY'S TREATMENT: Balance test 25/56  Therex: Superset 1: 2 sets Exercise:1: Ambulate 148 ft with RW; bilateral foot drag and knee hyperextension throughout session Exercise 2: 10x STS   Superset 2: ankle weights #2 lb on RLE; no weight on LLE: 2 rounds  Exercise 1: Standing marches 10x each LE  Exercise 2: LAQ 10x each LE   Seated: Hamstring slider curls 10x; 2 sets LLE  Adductor squeeze 10x; 2 sets   Neuro Re-ed: Single limb stance with BUE support on walker 30 seconds x 2 trials    PATIENT EDUCATION: Education details: POC Person educated: Patient Education method: Explanation Education comprehension: verbalized  understanding   HOME EXERCISE PROGRAM:  *still working on [employer], will reassess and update next time.     GOALS:  Goals reviewed with patient? Yes  SHORT TERM GOALS: Target date: 10/29/22  Patient will be independent in updated home exercise program to improve strength/mobility for better functional independence with ADLs. Baseline:  eval: still working on CIR HEP  Goal status: INITIAL  LONG TERM GOALS: Target date: 11/26/22 Patient will improve FOTO score by >14 to indicate subjective reduction in difficulty performing ADL/IADL tasks/mobility.  Baseline: eval: 42 Goal status: INITIAL   2.  Patient will demonstrate 5xSTS improvement in time and use of UE support to indicate improved power, strength, motor control, and reduced pain/dizziness.  Baseline eval: 19sec Goal status: INITIAL   3.   The pt will improve to >0.55m/s to improve ability to safely participate in typical work and/or leisure  Baseline: eval: 24.83sec c RW  Goal status: INITIAL   4.  Patient will improve score objective balance assessment by > 6 points in BERG test to indicate reduced falls risk and improved safety at home.   Baseline: 9/17: 25/ 56 Goal status: INITIAL    ASSESSMENT: Patient is highly motivated session. She does fatigue with ambulation and has increased foot drag with prolonged ambulation. Sit to stands rely on R dominant side and UE support. Decreased single leg stability noted with LLE indicating area of continued focus. Patient would benefit from skilled physical therapy to increase stability and mobility for improved quality of life.    OBJECTIVE IMPAIRMENTS Abnormal gait, decreased activity tolerance, decreased balance, decreased coordination, decreased endurance, decreased mobility, difficulty walking, and decreased strength.   ACTIVITY LIMITATIONS lifting, bending, standing, squatting, stairs, dressing, and caring for others  PARTICIPATION LIMITATIONS: meal prep, cleaning,  driving, shopping, yard work, and church  PERSONAL FACTORS Age are also affecting patient's functional outcome.   REHAB POTENTIAL: Good  CLINICAL DECISION MAKING: Evolving/moderate complexity  EVALUATION COMPLEXITY: Moderate  PLAN: PT FREQUENCY: 2x/week  PT DURATION: 8 weeks  PLANNED INTERVENTIONS: Therapeutic exercises, Therapeutic activity, Neuromuscular re-education, Balance training, Gait training,  Patient/Family education, Self Care, Joint mobilization, Dry Needling, and Manual therapy  PLAN FOR NEXT SESSION:   11:00 AM, 10/07/22 Precious Bard PT  Physical Therapist - [facility] [facility]  Outpatient Physical Therapy- Main Campus [phone]

## 2022-10-07 ENCOUNTER — Ambulatory Visit: Payer: Medicare HMO | Admitting: Occupational Therapy

## 2022-10-07 ENCOUNTER — Ambulatory Visit: Payer: Medicare HMO

## 2022-10-07 DIAGNOSIS — I63549 Cerebral infarction due to unspecified occlusion or stenosis of unspecified cerebellar artery: Secondary | ICD-10-CM | POA: Diagnosis not present

## 2022-10-07 DIAGNOSIS — I639 Cerebral infarction, unspecified: Secondary | ICD-10-CM

## 2022-10-07 DIAGNOSIS — R262 Difficulty in walking, not elsewhere classified: Secondary | ICD-10-CM

## 2022-10-07 DIAGNOSIS — R278 Other lack of coordination: Secondary | ICD-10-CM | POA: Diagnosis not present

## 2022-10-07 DIAGNOSIS — R2689 Other abnormalities of gait and mobility: Secondary | ICD-10-CM | POA: Diagnosis not present

## 2022-10-07 DIAGNOSIS — M6281 Muscle weakness (generalized): Secondary | ICD-10-CM

## 2022-10-07 DIAGNOSIS — R269 Unspecified abnormalities of gait and mobility: Secondary | ICD-10-CM | POA: Diagnosis not present

## 2022-10-07 NOTE — Therapy (Signed)
OUTPATIENT OCCUPATIONAL THERAPY NEURO TREATMENT  Patient Name: Susan Farrell MRN: [id number] DOB:[birth year], 77 y.o., female Today's Date: 10/07/2022  PCP: Dr. Marcello Fennel REFERRING PROVIDER: Mariam Dollar, [place]  END OF SESSION:  OT End of Session - 10/07/22 1308     Visit Number 2    Number of Visits 24    Date for OT Re-Evaluation 12/24/22    OT Start Time 0930    OT Stop Time 1015    OT Time Calculation (min) 45 min    Activity Tolerance Patient tolerated treatment well;No increased pain    Behavior During Therapy [facility] for tasks assessed/performed             Past Medical History:  Diagnosis Date   Hypertension    Stroke [facility])    Past Surgical History:  Procedure Laterality Date   ABDOMINAL HYSTERECTOMY     CHOLECYSTECTOMY     ENDOVASCULAR REPAIR/STENT GRAFT N/A 06/12/2020   Procedure: ENDOVASCULAR REPAIR/STENT GRAFT;  Surgeon: Renford Dills, MD;  Location: [facility] INVASIVE CV LAB;  Service: Cardiovascular;  Laterality: N/A;   Patient Active Problem List   Diagnosis Date Noted   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) 09/12/2022   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) 09/09/2022   History of abdominal aortic aneurysm (AAA) repair 09/09/2022   HTN (hypertension) 09/09/2022   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   Anemia of chronic disease 09/09/2022   Tobacco abuse 09/09/2022   Thyroid nodule 09/09/2022   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) 09/02/2021   Hypertensive urgency 08/29/2021   Dyslipidemia 08/29/2021   Hypothyroidism 08/29/2021   Cerebellar stroke, acute (HCC) 08/28/2021   Ruptured abdominal aortic aneurysm (AAA) (HCC) 06/12/2020   Goiter 09/13/2019   Osteopenia 09/13/2019    ONSET DATE: 09/08/2022  REFERRING DIAG: Cerebellar CVA  THERAPY DIAG:  Muscle weakness (generalized)  Other lack of coordination  Rationale for Evaluation and Treatment:  Rehabilitation  SUBJECTIVE:   SUBJECTIVE STATEMENT:  Pt. reports that she has just returned to her home, as her husband had COVID-19 when she was discharged form rehab. Pt accompanied by: self  PERTINENT HISTORY:   Pt. Is a 77 y.o. female who had a Cerbellar CVA, Occlusion of the right cerebellar artery. 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 Pt. 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 No  FALLS: Has patient fallen in last 6 months? No  LIVING ENVIRONMENT: Lives with: lives with their spouse Lives in: House/apartment, one story home Stairs: Yes: External: 1 steps; none Has following equipment at home: Counselling psychologist, Environmental consultant - 2 wheeled, Environmental consultant - 4 wheeled, and bed side commode  PLOF: Independent, driving  PATIENT GOALS: To have her right arm be more steady  OBJECTIVE:   HAND DOMINANCE: Right  ADLs: Transfers/ambulation related to ADLs: Eating: independent with the left hand, has difficulty using dominant right hand 2/2 incoordination Grooming: Independent UB Dressing: Independent, increased time LB Dressing: Independent, increased time Toileting: Independent Bathing: Supervision, uses shower seat Tub Shower transfers: Supervision   IADLs: Shopping: Family-moved back home yesterday 2/2 Husband having COVID when Pt. D/d's from inpatient rehab Light housekeeping: Independent Meal Prep: Has not had a chance to perform meal prep yet. Community mobility: Relies  on family Medication management: independent Financial management: Husband assist 2/2  difficulty with writing checks. Handwriting: 25% legible  MOBILITY STATUS: Needs Assist: uses a RW    ACTIVITY TOLERANCE: Activity tolerance: Fatigues after greater than 30 min. Of activity  FUNCTIONAL OUTCOME  MEASURES:  FOTO: 67, TR score: 79  MAM-20: Sum score: 69/80:  Items scored 2 indicated as very hard to do:  Cutting nails, cutting meat, writing 3-4 sentences legibly, picking up a 1/2 full water pitcher  Items scored: as 3 indicated as a little hard to do:  Opening a wide mouth jar, using a fork/spoon, dialing/keying in phone numbers    UPPER EXTREMITY ROM:    Active ROM Right Eval WNL Left Eval WNL  Shoulder flexion    Shoulder abduction    Shoulder adduction    Shoulder extension    Shoulder internal rotation    Shoulder external rotation    Elbow flexion    Elbow extension    Wrist flexion    Wrist extension    Wrist ulnar deviation    Wrist radial deviation    Wrist pronation    Wrist supination    (Blank rows = not tested)  UPPER EXTREMITY MMT:     MMT Right eval Left eval  Shoulder flexion 5/5 5/5  Shoulder abduction 5/5 5/5  Shoulder adduction    Shoulder extension    Shoulder internal rotation    Shoulder external rotation    Middle trapezius    Lower trapezius    Elbow flexion 5/5 5/5  Elbow extension 5/5 5/5  Wrist flexion    Wrist extension 5/5 5/5  Wrist ulnar deviation    Wrist radial deviation    Wrist pronation    Wrist supination    (Blank rows = not tested)  HAND FUNCTION: Grip strength: Right: 43 lbs; Left: 45 lbs, Lateral pinch: Right: 15 lbs, Left: 11 lbs, and 3 point pinch: Right: 9 lbs, Left: 8 lbs  COORDINATION: 9 Hole Peg test: Right: 54 sec. With multiple pegs dropped. Left: 32 sec  SENSATION: Light touch: WFL Proprioception: WFL  EDEMA: N/A  MUSCLE TONE: Intact  COGNITION: Overall cognitive status: Within functional limits for tasks assessed  VISION: Subjective report:  No vision changes Baseline vision: Wears glasses for reading only   PERCEPTION: WFL  PRAXIS: WFL   TODAY'S TREATMENT:                                                                                                                               DATE: 10/07/2022   Therapeutic Ex:  Pt. worked on green thearputty ex. for hand strengthening. Exercises including: gross gripping, gross digit extension, thumb abduction, lateral, and 3pt. Pinch strengthening, digit adduction/abduction, and thumb opposition. Pt. was provided with a visual handout HEP through [employer]. Pt. performed right gross gripping with a gross grip strengthener. Pt. worked on sustaining grip while grasping pegs and reaching at various heights. The gripper was set to 23.4# of grip strength resistance. Pt. performed 2  sets with a 2# cuff weight, and 1 set without a weight. Pt. worked on pinch strengthening in the right hand for lateral, and 3pt. pinch using yellow, red, green, and blue resistive clips. Pt. worked on placing the clips at various vertical and horizontal angles. Tactile and verbal cues were required for eliciting the desired movement.  Neuromuscular re-education:  Pt. worked on right hand [facility] skills using the Copy Task. Pt. worked on sustaining grasp on the resistive tweezers while grasping this sticks, and moving them from a horizontal position to a vertical position to prepare for placing them into the pegboard. Pt. required verbal cues, and cues for visual demonstration for wrist position, and hand pattern when placing them into the pegboard.   PATIENT EDUCATION: Education details: Right hand strengthening Person educated: Patient Education method: Medical illustrator Education comprehension: verbalized understanding, returned demonstration, and needs further education  HOME EXERCISE PROGRAM:  Right hand strengthening with green theraputty. Continue to assess needs, and provide HEPs as indicated.    GOALS: Goals reviewed with patient? Yes  SHORT TERM GOALS: Target date: 11/12/2022     Pt. Will be independent with HEPs for the RUE hand function Baseline: EVal: No current HEPs Goal status: INITIAL   LONG TERM  GOALS: Target date: 12/24/2022    Pt. Will improve MAM-20 sum score by 5 points for improved hand function Baseline: Eval: MAM-20 Sum score: 69/80 Goal status: INITIAL  2.  Pt. will increase FOTO score by 2 points to reflect Pt. perceived improvement with assessment specific ADL/IADL's.  Baseline: Eval: FOTO score: 67 Goal status: INITIAL  3.  Pt. Will be able to write 1  sentence with 75% legibility  in preparation for written correspondence Baseline: Eval: Name only 25% legibility Goal status: INITIAL  4.  Pt. Will improve right grip strength by 5# to be able to securely hold items without dropping them Baseline: Eval: Right: 43#, Left: 45# Goal status: INITIAL  5.  Pt. Will improve right hand [facility] skills by 3 sec. On the 9-hole peg test to be able to manipulate small objects Baseline: Eval: Right: 54 sec. With dropping multiple pegs, L: 32sec. Goal status: INITIAL  6.  Pt. Will be independently, and efficiently using her right hand to use utensils to cut, and eat her food Baseline:  Eval: Pt. Presents with difficulty using her right hand to use utensils. Goal status: INITIAL  ASSESSMENT:  CLINICAL IMPRESSION:  Pt. is making progress, and is engaging her RUE, and hand during more ADL, and IADL tasks at home. Pt. continues to present with decreased motor control when reaching to place items at a precise target. Pt. tolerated the resistive theraputty exercises, grip strength, and pinch strengthening exercises well today. Pt. was able to demonstrate improved motor control when reaching with the 2# cuff weight in place. Pt. continues to benefit from OT services for ADL training, A/E training, there. Ex., neuromuscular re-education, and Pt. education in order to improve RUE functioning, and increase engagement in ADL/IADL tasks, and maximize overall independence.  PERFORMANCE DEFICITS: in functional skills including ADLs, IADLs, coordination, dexterity, and strength.   IMPAIRMENTS: are  limiting patient from ADLs, IADLs, leisure, and social participation.   CO-MORBIDITIES: may have co-morbidities  that affects occupational performance. Patient will benefit from skilled OT to address above impairments and improve overall function.  MODIFICATION OR ASSISTANCE TO COMPLETE EVALUATION: Min-Moderate modification of tasks or assist with assess necessary to complete an evaluation.  OT OCCUPATIONAL PROFILE AND HISTORY:  Detailed assessment: Review of records and additional review of physical, cognitive, psychosocial history related to current functional performance.  CLINICAL DECISION MAKING: Moderate - several treatment options, min-mod task modification necessary  REHAB POTENTIAL: Good  EVALUATION COMPLEXITY: Moderate    PLAN:  OT FREQUENCY: 2x/week  OT DURATION: 12 weeks  PLANNED INTERVENTIONS: self care/ADL training, therapeutic exercise, therapeutic activity, neuromuscular re-education, manual therapy, functional mobility training, paraffin, moist heat, contrast bath, and patient/family education  RECOMMENDED OTHER SERVICES: PT  CONSULTED AND AGREED WITH PLAN OF CARE: Patient  PLAN FOR NEXT SESSION: To initiate treatment   Olegario Messier, MS, OTR/L 10/07/2022, 1:12 PM

## 2022-10-08 ENCOUNTER — Encounter: Payer: Self-pay | Admitting: Registered Nurse

## 2022-10-08 ENCOUNTER — Telehealth: Payer: Self-pay | Admitting: Registered Nurse

## 2022-10-08 ENCOUNTER — Encounter: Payer: Medicare HMO | Attending: Registered Nurse | Admitting: Registered Nurse

## 2022-10-08 VITALS — BP 133/82 | HR 69 | Ht 66.0 in | Wt 166.0 lb

## 2022-10-08 DIAGNOSIS — I63549 Cerebral infarction due to unspecified occlusion or stenosis of unspecified cerebellar artery: Secondary | ICD-10-CM | POA: Diagnosis not present

## 2022-10-08 DIAGNOSIS — E7849 Other hyperlipidemia: Secondary | ICD-10-CM | POA: Diagnosis not present

## 2022-10-08 DIAGNOSIS — I10 Essential (primary) hypertension: Secondary | ICD-10-CM | POA: Diagnosis not present

## 2022-10-08 MED ORDER — CLOPIDOGREL BISULFATE 75 MG PO TABS: 75.0000 mg | ORAL_TABLET | Freq: Every day | ORAL | 0 refills | Status: AC

## 2022-10-08 NOTE — Patient Instructions (Addendum)
Please call your Primary Care  Doctor to see if they have a earlier appointment. Hospital Follow Up appointment.    Please Call Your Neurologist to schedule Hospital Follow Up Appointment

## 2022-10-08 NOTE — Telephone Encounter (Signed)
Return Ms Avilla call,

## 2022-10-08 NOTE — Telephone Encounter (Signed)
Patient called in and states she was not on Plavix, she checked her medication at home and doesn't see any, patient asked for a call back , she is unsure if she should start taking it

## 2022-10-08 NOTE — Progress Notes (Signed)
Subjective:    Patient ID: Susan Farrell, female    DOB: [birth year], 77 y.o.   MRN: [id number]  HPI: Susan Farrell is a 77 y.o. 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 She presented to [facility] on 09/08/2022 with acute onset of dizziness nausea and vomiting while eating dinner.   Dr Para March H&P HPI: Susan Farrell is a 77 y.o. 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  Onset was at 5:30 PM on 09/08/2022.  She had associated nausea and went on to have 3 episodes of nonbloody nonbilious vomiting.  Since the onset she has continued to feel dizzy and whereas previously she could stand with the assistance of her cane she is unable to maintain her balance when standing and has to remain seated.  She was previously in her usual state of health.  She denies headache or visual disturbance.  She has no diarrhea, dysuria, abdominal pain, fever or chills.  Patient arrived to the ED at 8:55 PM however was not evaluated until later on around 11 PM when concern arose for possible CVA. ED course and data review: Vitals within normal limits.  Labs including CBC, BMP, TSH, troponin mostly unremarkable except for mild anemia with hemoglobin of 10.1 which is about her baseline. EKG, personally viewed and interpreted showing sinus at 70 with T wave inversion in anterolateral leads CTA head and neck and MRI were ordered which were consistent with stroke as follows   MR: Brain MPRESSION: Acute infarct in the right superior cerebellum and vermis, which correlates with the right superior  cerebellar artery territory. No evidence of hemorrhagic transformation.  CTA:  IMPRESSION: 1. Hypodensity in the superior right cerebellum and vermis, consistent with the acute right SCA territory infarct seen on the same-day MRI. 2. Decreased caliber of proximal right SCA compared to 08/28/2021, although the vessel does appear patent proximally. 3. No intracranial large vessel occlusion. 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 4. No hemodynamically significant stenosis in the neck.   Neurology was consulted : ASA 81 mg daily and Plavix 75 mg daily x 3 months then aspirin alone.   Susan Farrell was admitted to inpatient rehabilitation on 09/12/2022 and discharged home on 09/25/2022. She is receiving outpatient therapy at [facility]. She denies any pain. She rates her pain 0. Also reports she has a good appetite.   Husband in room  Pain Inventory Average Pain 0 Pain Right Now 0 My pain is  No Pain  LOCATION OF PAIN  No Pain  BOWEL Number of stools per week: 6   BLADDER Pads   Mobility walk with assistance use a walker  Function not employed: date last employed .  Neuro/Psych No problems in this area  Prior Studies Any changes since last visit?  no  Physicians involved in your care Any changes since last visit?  no   Family History  Problem Relation Age of Onset   Hypertension Mother    Hypertension Father    Diabetes Sister    Breast cancer Neg Hx    Social History   Socioeconomic History   Marital status: Married  Spouse name: Not on file   Number of children: Not on file   Years of education: Not on file   Highest education level: Not on file  Occupational History   Not on file  Tobacco Use   Smoking status: Every Day    Types: Cigarettes   Smokeless tobacco: Current  Vaping Use   Vaping status: Never Used  Substance and Sexual Activity   Alcohol use: Not Currently   Drug use: Never   Sexual activity: Yes  Other Topics Concern    Not on file  Social History Narrative   Not on file   Social Determinants of Health   Financial Resource Strain: Low Risk  (08/15/2022)   Received from [facility]   Overall Financial Resource Strain (CARDIA)    Difficulty of Paying Living Expenses: Not hard at all  Food Insecurity: No Food Insecurity (09/09/2022)   Hunger Vital Sign    Worried About Running Out of Food in the Last Year: Never true    Ran Out of Food in the Last Year: Never true  Transportation Needs: No Transportation Needs (09/09/2022)   PRAPARE - Administrator, Civil Service (Medical): No    Lack of Transportation (Non-Medical): No  Physical Activity: Insufficiently Active (05/12/2019)   Received from [facility], [facility]   Exercise Vital Sign    Days of Exercise per Week: 2 days    Minutes of Exercise per Session: 30 min  Stress: No Stress Concern Present (04/21/2022)   [name] of Occupational Health - Occupational Stress Questionnaire    Feeling of Stress : Not at all  Social Connections: Socially Integrated (05/12/2019)   Received from [facility], [facility]   Social Connection and Isolation Panel [NHANES]    Frequency of Communication with Friends and Family: More than three times a week    Frequency of Social Gatherings with Friends and Family: More than three times a week    Attends Religious Services: More than 4 times per year    Active Member of Clubs or Organizations: Yes    Attends Banker Meetings: More than 4 times per year    Marital Status: Married   Past Surgical History:  Procedure Laterality Date   ABDOMINAL HYSTERECTOMY     CHOLECYSTECTOMY     ENDOVASCULAR REPAIR/STENT GRAFT N/A 06/12/2020   Procedure: ENDOVASCULAR REPAIR/STENT GRAFT;  Surgeon: Renford Dills, MD;  Location: [facility] INVASIVE CV LAB;  Service: Cardiovascular;  Laterality: N/A;   Past Medical  History:  Diagnosis Date   Hypertension    Stroke (HCC)    There were no vitals taken for this visit.  Opioid Risk Score:   Fall Risk Score:  `1  Depression screen [facility] 2/9     10/08/2022   12:44 PM 11/14/2021   10:40 AM 09/25/2021    1:04 PM 09/25/2021    1:01 PM  Depression screen PHQ 2/9  Decreased Interest 0 0 0 0  Down, Depressed, Hopeless 0 0 0 0  PHQ - 2 Score 0 0 0 0  Altered sleeping 0  0   Tired, decreased energy 2  0   Change in appetite 0  0   Feeling bad or failure about yourself  0  0   Trouble concentrating 0  0   Moving slowly or fidgety/restless 1  0   Suicidal thoughts 0  0   PHQ-9 Score 3  0   Difficult doing work/chores Somewhat difficult  Not difficult at all       Review of Systems  All other systems reviewed and are negative.     Objective:   Physical Exam Vitals and nursing note reviewed.  Constitutional:      Appearance: Normal appearance.  Cardiovascular:     Rate and Rhythm: Normal rate and regular rhythm.     Pulses: Normal pulses.     Heart sounds: Normal heart sounds.  Pulmonary:     Effort: Pulmonary effort is normal.     Breath sounds: Normal breath sounds.  Abdominal:     General: There is distension.  Musculoskeletal:     Cervical back: Normal range of motion and neck supple.     Comments: Normal Muscle Bulk and Muscle Testing Reveals:  Upper Extremities: Full ROM and Muscle Strength on Right 5/5 and Left 4/5   Lower Extremities: Right: Full ROM and Muscle Strength 5/5 Left Lower Extremity: Decreased ROM and Muscle Strength 5/5 Wearing Left AFO Arises from Table slowly using walker for support Steppage  Gait     Skin:    General: Skin is warm and dry.  Neurological:     Mental Status: She is alert and oriented to person, place, and time.  Psychiatric:        Mood and Affect: Mood normal.        Behavior: Behavior normal.         Assessment & Plan:  Cerebellar Infarction due to occlusion of superior cerebellar  artery: Continue Plavix and ASA. Continue Outpatient Therapy. Ms. Stuhl states she has a Insurance account manager with [facility], she was instructed to call to schedule an appointment. She verbalizes understanding.   Essential Hypertension: Continue current medication regimen. PCP following. Continue to Monitor.   Hyperlipidemia: Continue current medication regimen. PCP following. Continue to Monitor.   F/U with Dr Wynn Banker in 4- 6 weeks

## 2022-10-08 NOTE — Telephone Encounter (Addendum)
Return  Ms. Arman call she states she doesn't have  the Plavix. According to discharge and Medication review prescription was sent to [facility]. Plavix prescription was sent to pharmacy and Message will be sent to [facility] to inquire on the above.  Susan Farrell understanding.   [facility] called,spoke with pharmacist Will he stated the Plavix  was filled 09/12/2022 and picked up 09/12/2022. Called Ms. Chaires again and we discussed the above she said her husband found the medication.  The prescription this provider sent to pharmacist has been placed on hold after speaking with pharmacist. She verbalizes understanding.

## 2022-10-09 ENCOUNTER — Telehealth: Payer: Self-pay | Admitting: Registered Nurse

## 2022-10-09 ENCOUNTER — Ambulatory Visit: Payer: Medicare HMO

## 2022-10-09 DIAGNOSIS — M6281 Muscle weakness (generalized): Secondary | ICD-10-CM | POA: Diagnosis not present

## 2022-10-09 DIAGNOSIS — R262 Difficulty in walking, not elsewhere classified: Secondary | ICD-10-CM | POA: Diagnosis not present

## 2022-10-09 DIAGNOSIS — I63549 Cerebral infarction due to unspecified occlusion or stenosis of unspecified cerebellar artery: Secondary | ICD-10-CM | POA: Diagnosis not present

## 2022-10-09 DIAGNOSIS — R2689 Other abnormalities of gait and mobility: Secondary | ICD-10-CM | POA: Diagnosis not present

## 2022-10-09 DIAGNOSIS — I639 Cerebral infarction, unspecified: Secondary | ICD-10-CM | POA: Diagnosis not present

## 2022-10-09 DIAGNOSIS — R278 Other lack of coordination: Secondary | ICD-10-CM

## 2022-10-09 DIAGNOSIS — R269 Unspecified abnormalities of gait and mobility: Secondary | ICD-10-CM

## 2022-10-09 NOTE — Therapy (Addendum)
OUTPATIENT PHYSICAL THERAPY TREATMENT    Patient Name: Susan Farrell MRN: [id number] DOB:1945-05-01, 77 y.o., female Today's Date: 10/09/2022   PCP: Barbette Reichmann, MD REFERRING PROVIDER: Charlton Amor, PA-C Purpose for referral, evaluation, treatment: Rehabilitation.         Past Medical History:  Diagnosis Date   Hypertension    Stroke [facility])    Past Surgical History:  Procedure Laterality Date   ABDOMINAL HYSTERECTOMY     CHOLECYSTECTOMY     ENDOVASCULAR REPAIR/STENT GRAFT N/A 06/12/2020   Procedure: ENDOVASCULAR REPAIR/STENT GRAFT;  Surgeon: Renford Dills, MD;  Location: [facility] INVASIVE CV LAB;  Service: Cardiovascular;  Laterality: N/A;     ONSET DATE: 08/28/21  REFERRING DIAG: I63.81 (ICD-10-CM) - 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)  THERAPY DIAG:  No diagnosis found.  Rationale for Evaluation and Treatment Rehabilitation  SUBJECTIVE:                                                                                                                                                                                             SUBJECTIVE STATEMENT: Patient reports feeling okay today- Denies any    PERTINENT HISTORY:   [place] sustained a cerebelar CVA in August 2024, rapid progression in symptom improved over first week, DC acute to CIR, then home ot son's house for 5 days and finally back ot her house 2 days prior to this eval. Pt has been using RW at home since DC, no rollator use yet.   PAIN:  Are you having pain? No  PRECAUTIONS: Fall  WEIGHT BEARING RESTRICTIONS No  FALLS: Has patient fallen in last 6 months? No   LIVING ENVIRONMENT: Lives with: lives with their family and lives with their partner Lives in: House/apartment Stairs: Yes: External: 1 steps; none Has following equipment at home: Dan Humphreys - 2 wheeled, Environmental consultant - 4 wheeled, bed side commode, and only uses bed side commode at night   PLOF: Independent,  Independent with basic ADLs, Independent with household mobility without device, Independent with community mobility without device, Independent with gait, and Leisure: going to church, going to grocery and department stores.   PATIENT GOALS Get back to normal walking and balance without any support.   OBJECTIVE:   FUNCTIONAL TESTs:  5 times sit to stand: 19 sec hands on knees   Last years 6 minute walk test: 234 feet and requires seated rest break due to lower extremity fatigue on the left lower extremity  Last years Berg Balance Scale: 36  PATIENT SURVEYS:  FOTO 42  TODAY'S TREATMENT: Balance test 25/56  Therex:  Sit to stand x  10 reps   Superset 1: 2 sets Exercise:1: Ambulate 160 ft with RW; some Left foot drag despite wearing AFO yet did improve with VC   Exercise 2: Step ups x 10 reps  (increased UE support with left LE)   Standing Terminal Knee ext - RTB 3 sets of 10 reps  Standing heel to toe activity with RTB  2 x 10 reps (around left ankle)  Standing heel to toe activity -Swinging Left LE forward onto 1/2 foam to clear toes       PATIENT EDUCATION: Education details: POC Person educated: Patient Education method: Explanation Education comprehension: verbalized understanding   HOME EXERCISE PROGRAM:  *still working on [employer], will reassess and update next time.     GOALS:  Goals reviewed with patient? Yes  SHORT TERM GOALS: Target date: 10/29/22  Patient will be independent in updated home exercise program to improve strength/mobility for better functional independence with ADLs. Baseline:  eval: still working on CIR HEP  Goal status: INITIAL  LONG TERM GOALS: Target date: 11/26/22 Patient will improve FOTO score by >14 to indicate subjective reduction in difficulty performing ADL/IADL tasks/mobility.  Baseline: eval: 42 Goal status: INITIAL   2.  Patient will demonstrate 5xSTS improvement in time and use of UE support to indicate improved power,  strength, motor control, and reduced pain/dizziness.  Baseline eval: 19sec Goal status: INITIAL   3.   The pt will improve to >0.72m/s to improve ability to safely participate in typical work and/or leisure  Baseline: eval: 24.83sec c RW  Goal status: INITIAL   4.  Patient will improve score objective balance assessment by > 6 points in BERG test to indicate reduced falls risk and improved safety at home.   Baseline: 9/17: 25/ 56 Goal status: INITIAL    ASSESSMENT: Patient continued to present with left foot drag and some hyperextension with standing. She was able to improve her gait quality with practice and repetitive VC. She was able to terminally extend her left knee on cue with theraband yet difficulty translating to weight bearing activities.  Patient would benefit from skilled physical therapy to increase stability and mobility for improved quality of life.    OBJECTIVE IMPAIRMENTS Abnormal gait, decreased activity tolerance, decreased balance, decreased coordination, decreased endurance, decreased mobility, difficulty walking, and decreased strength.   ACTIVITY LIMITATIONS lifting, bending, standing, squatting, stairs, dressing, and caring for others  PARTICIPATION LIMITATIONS: meal prep, cleaning, driving, shopping, yard work, and church  PERSONAL FACTORS Age are also affecting patient's functional outcome.   REHAB POTENTIAL: Good  CLINICAL DECISION MAKING: Evolving/moderate complexity  EVALUATION COMPLEXITY: Moderate  PLAN: PT FREQUENCY: 2x/week  PT DURATION: 8 weeks  PLANNED INTERVENTIONS: Therapeutic exercises, Therapeutic activity, Neuromuscular re-education, Balance training, Gait training, Patient/Family education, Self Care, Joint mobilization, Dry Needling, and Manual therapy  PLAN FOR NEXT SESSION:  Continue with progressive LE strengthening, gait/balance training.     7:28 AM, 10/09/22 Lenda Kelp PT  Physical Therapist - [facility]  Outpatient Physical Therapy- Main Campus [phone]

## 2022-10-09 NOTE — Telephone Encounter (Signed)
Susan Farrell, received 90 tablets of Plavix  ,  This provider Plavix prescription was canceled at [facility] [name] was called today, she was instructed to obtain an appointment with Neurologist, she verbalizes understanding.  She was informed of the above and verbalizes understanding.

## 2022-10-11 NOTE — Therapy (Signed)
OUTPATIENT OCCUPATIONAL THERAPY NEURO TREATMENT  Patient Name: Susan Farrell MRN: [id number] DOB:Dec 30, 1945, 77 y.o., female Today's Date: 10/11/2022  PCP: Dr. Marcello Fennel REFERRING PROVIDER: Mariam Dollar, [place]  END OF SESSION:  OT End of Session - 10/11/22 2044     Visit Number 3    Number of Visits 24    Date for OT Re-Evaluation 12/24/22    Progress Note Due on Visit 10    OT Start Time 0845    OT Stop Time 0930    OT Time Calculation (min) 45 min    Activity Tolerance Patient tolerated treatment well;No increased pain    Behavior During Therapy [facility] for tasks assessed/performed             Past Medical History:  Diagnosis Date   Hypertension    Stroke [facility])    Past Surgical History:  Procedure Laterality Date   ABDOMINAL HYSTERECTOMY     CHOLECYSTECTOMY     ENDOVASCULAR REPAIR/STENT GRAFT N/A 06/12/2020   Procedure: ENDOVASCULAR REPAIR/STENT GRAFT;  Surgeon: Renford Dills, MD;  Location: [facility] INVASIVE CV LAB;  Service: Cardiovascular;  Laterality: N/A;   Patient Active Problem List   Diagnosis Date Noted   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) 09/12/2022   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) 09/09/2022   History of abdominal aortic aneurysm (AAA) repair 09/09/2022   HTN (hypertension) 09/09/2022   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   Anemia of chronic disease 09/09/2022   Tobacco abuse 09/09/2022   Thyroid nodule 09/09/2022   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) 09/02/2021   Hypertensive urgency 08/29/2021   Dyslipidemia 08/29/2021   Hypothyroidism 08/29/2021   Cerebellar stroke, acute (HCC) 08/28/2021   Ruptured abdominal aortic aneurysm (AAA) (HCC) 06/12/2020   Goiter 09/13/2019   Osteopenia 09/13/2019    ONSET DATE: 09/08/2022  REFERRING DIAG: Cerebellar CVA  THERAPY DIAG:  Muscle weakness (generalized)  Other lack of coordination  Cerebellar  stroke, acute (HCC)  Rationale for Evaluation and Treatment: Rehabilitation  SUBJECTIVE:   SUBJECTIVE STATEMENT: Pt reports frustration with her handwriting legibility since her stroke. Pt accompanied by: self  PERTINENT HISTORY:   Pt. Is a 77 y.o. female who had a Cerbellar CVA, Occlusion of the right cerebellar artery. 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 Pt. 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 No  FALLS: Has patient fallen in last 6 months? No  LIVING ENVIRONMENT: Lives with: lives with their spouse Lives in: House/apartment, one story home Stairs: Yes: External: 1 steps; none Has following equipment at home: Counselling psychologist, Environmental consultant - 2 wheeled, Environmental consultant - 4 wheeled, and bed side commode  PLOF: Independent, driving  PATIENT GOALS: To have her right arm be more steady  OBJECTIVE:   HAND DOMINANCE: Right  ADLs: Transfers/ambulation related to ADLs: Eating: independent with the left hand, has difficulty using dominant right hand 2/2 incoordination Grooming: Independent UB Dressing: Independent, increased time LB Dressing: Independent, increased time Toileting: Independent Bathing: Supervision, uses shower seat Tub Shower transfers: Supervision   IADLs: Shopping: Family-moved back home yesterday 2/2 Husband having COVID when Pt. D/d's from inpatient rehab Light housekeeping: Independent Meal Prep: Has not had a chance to perform meal prep yet. Community mobility: Relies  on family Medication management: independent Financial management: Husband  assist 2/2  difficulty with writing checks. Handwriting: 25% legible  MOBILITY STATUS: Needs Assist: uses a RW  ACTIVITY TOLERANCE: Activity tolerance: Fatigues after greater than 30 min. Of activity  FUNCTIONAL  OUTCOME MEASURES:  FOTO: 67, TR score: 79  MAM-20: Sum score: 69/80:  Items scored 2 indicated as very hard to do:  Cutting nails, cutting meat, writing 3-4 sentences legibly, picking up a 1/2 full water pitcher  Items scored: as 3 indicated as a little hard to do:  Opening a wide mouth jar, using a fork/spoon, dialing/keying in phone numbers  UPPER EXTREMITY ROM:    Active ROM Right Eval WNL Left Eval WNL  Shoulder flexion    Shoulder abduction    Shoulder adduction    Shoulder extension    Shoulder internal rotation    Shoulder external rotation    Elbow flexion    Elbow extension    Wrist flexion    Wrist extension    Wrist ulnar deviation    Wrist radial deviation    Wrist pronation    Wrist supination    (Blank rows = not tested)  UPPER EXTREMITY MMT:     MMT Right eval Left eval  Shoulder flexion 5/5 5/5  Shoulder abduction 5/5 5/5  Shoulder adduction    Shoulder extension    Shoulder internal rotation    Shoulder external rotation    Middle trapezius    Lower trapezius    Elbow flexion 5/5 5/5  Elbow extension 5/5 5/5  Wrist flexion    Wrist extension 5/5 5/5  Wrist ulnar deviation    Wrist radial deviation    Wrist pronation    Wrist supination    (Blank rows = not tested)  HAND FUNCTION: Grip strength: Right: 43 lbs; Left: 45 lbs, Lateral pinch: Right: 15 lbs, Left: 11 lbs, and 3 point pinch: Right: 9 lbs, Left: 8 lbs  COORDINATION: 9 Hole Peg test: Right: 54 sec. With multiple pegs dropped. Left: 32 sec  SENSATION: Light touch: WFL Proprioception: WFL  EDEMA: N/A  MUSCLE TONE: Intact  COGNITION: Overall cognitive status: Within functional limits for tasks assessed  VISION: Subjective report:  No vision changes Baseline vision: Wears glasses for reading only   PERCEPTION: WFL  PRAXIS: WFL  TODAY'S TREATMENT:                                                                                                                               DATE: 10/09/2022  Neuro re-ed: RUE FMC/GMC skills: Pt picked up 1/2"-1" washers from dish and placed over vertical dowels to target small item pick up and reaching toward a target.  Increased challenge by working on storage of multiple washers in hand while placing 1 at a time onto vertical dowel.  Min vc to avoid stabilizing top of dowel with fingers when placing washers to further challenge reaching accuracy.  Therapeutic Activity: Practiced accuracy with fork working to stab small pieces  of putty with fork in R hand.  Pt completed set up of small balls of putty using R hand to form small balls to simulate pieces of food.  Self Care: Focus on handwriting skills using standard pen and lined paper.  Practiced high reps of first and last name, phone number, address, short sentence writing, numbers 1-10; all of the above in reps of 5-10.    PATIENT EDUCATION: Education details: HEP progression (handwriting) Person educated: Patient Education method: Medical illustrator Education comprehension: verbalized understanding, returned demonstration, and needs further education  HOME EXERCISE PROGRAM:  Right hand strengthening with green theraputty, handwriting  GOALS: Goals reviewed with patient? Yes  SHORT TERM GOALS: Target date: 11/12/2022     Pt. Will be independent with HEPs for the RUE hand function Baseline: EVal: No current HEPs Goal status: INITIAL   LONG TERM GOALS: Target date: 12/24/2022    Pt. Will improve MAM-20 sum score by 5 points for improved hand function Baseline: Eval: MAM-20 Sum score: 69/80 Goal status: INITIAL  2.  Pt. will increase FOTO score by 2 points to reflect Pt. perceived improvement with assessment specific ADL/IADL's.  Baseline: Eval: FOTO score: 67 Goal status: INITIAL  3.  Pt. Will be able to write 1  sentence with 75% legibility  in preparation for written correspondence Baseline: Eval: Name only 25% legibility Goal status:  INITIAL  4.  Pt. Will improve right grip strength by 5# to be able to securely hold items without dropping them Baseline: Eval: Right: 43#, Left: 45# Goal status: INITIAL  5.  Pt. Will improve right hand [facility] skills by 3 sec. On the 9-hole peg test to be able to manipulate small objects Baseline: Eval: Right: 54 sec. With dropping multiple pegs, L: 32sec. Goal status: INITIAL  6.  Pt. Will be independently, and efficiently using her right hand to use utensils to cut, and eat her food Baseline:  Eval: Pt. Presents with difficulty using her right hand to use utensils. Goal status: INITIAL  ASSESSMENT:  CLINICAL IMPRESSION: Targeted RUE FMC/GMC skills with manipulation of washers and placing washers over a vertical dowel, noting mild ataxia when reaching toward a target with an outstretched arm.  Pt demonstrated good stability when holding fork in R hand, and good accuracy when stabbing pieces of putty to simulate food pick up with fork.  Pt continues to verbalize frustration with handwriting legibility, but was encouraged to observe improved legibility with high reps of name, numbers, short sentences, etc.  OT advised daily practice with high reps of above noted examples (see note).  Pt eager to add this to her HEP after seeing improvements with high reps.  Pt. continues to benefit from OT services for ADL training, A/E training, there. Ex., neuromuscular re-education, and Pt. education in order to improve RUE functioning, and increase engagement in ADL/IADL tasks, and maximize overall independence.  PERFORMANCE DEFICITS: in functional skills including ADLs, IADLs, coordination, dexterity, and strength.   IMPAIRMENTS: are limiting patient from ADLs, IADLs, leisure, and social participation.   CO-MORBIDITIES: may have co-morbidities  that affects occupational performance. Patient will benefit from skilled OT to address above impairments and improve overall function.  MODIFICATION OR ASSISTANCE  TO COMPLETE EVALUATION: Min-Moderate modification of tasks or assist with assess necessary to complete an evaluation.  OT OCCUPATIONAL PROFILE AND HISTORY: Detailed assessment: Review of records and additional review of physical, cognitive, psychosocial history related to current functional performance.  CLINICAL DECISION MAKING: Moderate - several treatment options, min-mod task  modification necessary  REHAB POTENTIAL: Good  EVALUATION COMPLEXITY: Moderate    PLAN:  OT FREQUENCY: 2x/week  OT DURATION: 12 weeks  PLANNED INTERVENTIONS: self care/ADL training, therapeutic exercise, therapeutic activity, neuromuscular re-education, manual therapy, functional mobility training, paraffin, moist heat, contrast bath, and patient/family education  RECOMMENDED OTHER SERVICES: PT  CONSULTED AND AGREED WITH PLAN OF CARE: Patient  PLAN FOR NEXT SESSION: see above  Danelle Earthly, MS, OTR/L   10/11/2022, 8:45 PM

## 2022-10-14 ENCOUNTER — Ambulatory Visit: Payer: Medicare HMO

## 2022-10-14 ENCOUNTER — Ambulatory Visit: Payer: Medicare HMO | Admitting: Occupational Therapy

## 2022-10-14 DIAGNOSIS — R269 Unspecified abnormalities of gait and mobility: Secondary | ICD-10-CM | POA: Diagnosis not present

## 2022-10-14 DIAGNOSIS — R278 Other lack of coordination: Secondary | ICD-10-CM | POA: Diagnosis not present

## 2022-10-14 DIAGNOSIS — M6281 Muscle weakness (generalized): Secondary | ICD-10-CM

## 2022-10-14 DIAGNOSIS — R2689 Other abnormalities of gait and mobility: Secondary | ICD-10-CM

## 2022-10-14 DIAGNOSIS — R262 Difficulty in walking, not elsewhere classified: Secondary | ICD-10-CM

## 2022-10-14 DIAGNOSIS — I63549 Cerebral infarction due to unspecified occlusion or stenosis of unspecified cerebellar artery: Secondary | ICD-10-CM | POA: Diagnosis not present

## 2022-10-14 DIAGNOSIS — I639 Cerebral infarction, unspecified: Secondary | ICD-10-CM

## 2022-10-14 NOTE — Therapy (Signed)
OUTPATIENT PHYSICAL THERAPY TREATMENT    Patient Name: Susan Farrell MRN: [id number] DOB:10-Aug-1945, 77 y.o., female Today's Date: 10/14/2022   PCP: Barbette Reichmann, MD REFERRING PROVIDER: Charlton Amor, PA-C Purpose for referral, evaluation, treatment: Rehabilitation.   PT End of Session - 10/14/22 0805     Visit Number 4    Number of Visits 16    Date for PT Re-Evaluation 11/26/22    Authorization Type [employer]    Authorization Time Period 10/01/22-11/26/22    Progress Note Due on Visit 10    PT Start Time 0802    Equipment Utilized During Treatment Gait belt    Activity Tolerance Patient tolerated treatment well;No increased pain    Behavior During Therapy [facility] for tasks assessed/performed                  Past Medical History:  Diagnosis Date   Hypertension    Stroke [facility])    Past Surgical History:  Procedure Laterality Date   ABDOMINAL HYSTERECTOMY     CHOLECYSTECTOMY     ENDOVASCULAR REPAIR/STENT GRAFT N/A 06/12/2020   Procedure: ENDOVASCULAR REPAIR/STENT GRAFT;  Surgeon: Renford Dills, MD;  Location: [facility] INVASIVE CV LAB;  Service: Cardiovascular;  Laterality: N/A;     ONSET DATE: 08/28/21  REFERRING DIAG: I63.81 (ICD-10-CM) - 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)  THERAPY DIAG:  Muscle weakness (generalized)  Other lack of coordination  Cerebellar stroke, acute (HCC)  Difficulty in walking, not elsewhere classified  Abnormality of gait and mobility  Other abnormalities of gait and mobility  Rationale for Evaluation and Treatment Rehabilitation  SUBJECTIVE:                                                                                                                                                                                             SUBJECTIVE STATEMENT: Patient reports feeling okay this week. Reports was able to go to church this past Sunday.    PERTINENT HISTORY:   Cianni sustained a  cerebelar CVA in August 2024, rapid progression in symptom improved over first week, DC acute to CIR, then home ot son's house for 5 days and finally back ot her house 2 days prior to this eval. Pt has been using RW at home since DC, no rollator use yet.   PAIN:  Are you having pain? No  PRECAUTIONS: Fall  WEIGHT BEARING RESTRICTIONS No  FALLS: Has patient fallen in last 6 months? No   LIVING ENVIRONMENT: Lives with: lives with their family and lives with their partner Lives in: House/apartment Stairs: Yes: External: 1 steps; none Has following  equipment at home: Dan Humphreys - 2 wheeled, Walker - 4 wheeled, bed side commode, and only uses bed side commode at night   PLOF: Independent, Independent with basic ADLs, Independent with household mobility without device, Independent with community mobility without device, Independent with gait, and Leisure: going to church, going to grocery and department stores.   PATIENT GOALS Get back to normal walking and balance without any support.   OBJECTIVE:   FUNCTIONAL TESTs:  5 times sit to stand: 19 sec hands on knees   Last years 6 minute walk test: 234 feet and requires seated rest break due to lower extremity fatigue on the left lower extremity  Last years Berg Balance Scale: 36  PATIENT SURVEYS:  FOTO 42  TODAY'S TREATMENT:   Therex: Seated hip march alt LE (no weight on left) 2 x 10 reps  Seated knee ext alt LE (no weight on left) 2 x 10 reps (patient presents with increased fatigue)   Seated assisted ham curl using sliding red disc 2 x 10 reps   Ambulate 320 ft with RW- focusing on heel strike (Minimal VC today)   Step ups x 10 reps  (increased UE support with left LE)   Standing Terminal Knee ext - GTB 3 sets of 10 reps   Standing activity- PT lightly holding left knee to watch for hyperextension as patient would lateral weight shift to left LE while dynamically tapping with right LE up/onto airex pad 2 x 10 reps  Standing  heel to toe activity -Swinging Left LE forward onto 1/2 foam to clear toes with GTB  x 10 reps (around left ankle) - Patient very fatigued switched on second set to no resistance.   Standing hip swing up/over GTB positioned on floor x 12 reps alt direction. Min difficulty at end of session.         PATIENT EDUCATION: Education details: POC Person educated: Patient Education method: Explanation Education comprehension: verbalized understanding   HOME EXERCISE PROGRAM:  *still working on [employer], will reassess and update next time.     GOALS:  Goals reviewed with patient? Yes  SHORT TERM GOALS: Target date: 10/29/22  Patient will be independent in updated home exercise program to improve strength/mobility for better functional independence with ADLs. Baseline:  eval: still working on CIR HEP  Goal status: INITIAL  LONG TERM GOALS: Target date: 11/26/22 Patient will improve FOTO score by >14 to indicate subjective reduction in difficulty performing ADL/IADL tasks/mobility.  Baseline: eval: 42 Goal status: INITIAL   2.  Patient will demonstrate 5xSTS improvement in time and use of UE support to indicate improved power, strength, motor control, and reduced pain/dizziness.  Baseline eval: 19sec Goal status: INITIAL   3.   The pt will improve to >0.14m/s to improve ability to safely participate in typical work and/or leisure  Baseline: eval: 24.83sec c RW  Goal status: INITIAL   4.  Patient will improve score objective balance assessment by > 6 points in BERG test to indicate reduced falls risk and improved safety at home.   Baseline: 9/17: 25/ 56 Goal status: INITIAL    ASSESSMENT: Patient responded well to treatment today- able to increase some resistance today and patient was challenged- yet able to complete reps. She exhibited less foot drag and able to concentrate well- increasing gait distance overall.  Patient would benefit from skilled physical therapy to  increase stability and mobility for improved quality of life.    OBJECTIVE IMPAIRMENTS Abnormal gait, decreased  activity tolerance, decreased balance, decreased coordination, decreased endurance, decreased mobility, difficulty walking, and decreased strength.   ACTIVITY LIMITATIONS lifting, bending, standing, squatting, stairs, dressing, and caring for others  PARTICIPATION LIMITATIONS: meal prep, cleaning, driving, shopping, yard work, and church  PERSONAL FACTORS Age are also affecting patient's functional outcome.   REHAB POTENTIAL: Good  CLINICAL DECISION MAKING: Evolving/moderate complexity  EVALUATION COMPLEXITY: Moderate  PLAN: PT FREQUENCY: 2x/week  PT DURATION: 8 weeks  PLANNED INTERVENTIONS: Therapeutic exercises, Therapeutic activity, Neuromuscular re-education, Balance training, Gait training, Patient/Family education, Self Care, Joint mobilization, Dry Needling, and Manual therapy  PLAN FOR NEXT SESSION:  Continue with progressive LE strengthening, gait/balance training.     8:06 AM, 10/14/22 Lenda Kelp PT  Physical Therapist - [facility]  Outpatient Physical Therapy- Main Campus [phone]

## 2022-10-15 NOTE — Therapy (Signed)
OUTPATIENT OCCUPATIONAL THERAPY NEURO TREATMENT  Patient Name: Susan Farrell MRN: [id number] DOB:1945/01/29, 77 y.o., female Today's Date: 10/15/2022  PCP: Dr. Marcello Fennel REFERRING PROVIDER: Mariam Dollar, [place]  END OF SESSION:  OT End of Session - 10/15/22 0834     Visit Number 4    Number of Visits 24    Date for OT Re-Evaluation 12/24/22    Progress Note Due on Visit 10    OT Start Time 0845    OT Stop Time 0930    OT Time Calculation (min) 45 min    Activity Tolerance Patient tolerated treatment well;No increased pain    Behavior During Therapy [facility] for tasks assessed/performed             Past Medical History:  Diagnosis Date   Hypertension    Stroke [facility])    Past Surgical History:  Procedure Laterality Date   ABDOMINAL HYSTERECTOMY     CHOLECYSTECTOMY     ENDOVASCULAR REPAIR/STENT GRAFT N/A 06/12/2020   Procedure: ENDOVASCULAR REPAIR/STENT GRAFT;  Surgeon: Renford Dills, MD;  Location: [facility] INVASIVE CV LAB;  Service: Cardiovascular;  Laterality: N/A;   Patient Active Problem List   Diagnosis Date Noted   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) 09/12/2022   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) 09/09/2022   History of abdominal aortic aneurysm (AAA) repair 09/09/2022   HTN (hypertension) 09/09/2022   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   Anemia of chronic disease 09/09/2022   Tobacco abuse 09/09/2022   Thyroid nodule 09/09/2022   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) 09/02/2021   Hypertensive urgency 08/29/2021   Dyslipidemia 08/29/2021   Hypothyroidism 08/29/2021   Cerebellar stroke, acute (HCC) 08/28/2021   Ruptured abdominal aortic aneurysm (AAA) (HCC) 06/12/2020   Goiter 09/13/2019   Osteopenia 09/13/2019    ONSET DATE: 09/08/2022  REFERRING DIAG: Cerebellar CVA  THERAPY DIAG:  Muscle weakness (generalized)  Other lack of coordination  Rationale for  Evaluation and Treatment: Rehabilitation  SUBJECTIVE:   SUBJECTIVE STATEMENT:  Pt. reports wanting to improve her handwriting legibility  Pt accompanied by: self  PERTINENT HISTORY:   Pt. is a 77 y.o. female who had a Cerbellar CVA, Occlusion of the right cerebellar artery. 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 Pt. 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 No  FALLS: Has patient fallen in last 6 months? No  LIVING ENVIRONMENT: Lives with: lives with their spouse Lives in: House/apartment, one story home Stairs: Yes: External: 1 steps; none Has following equipment at home: Counselling psychologist, Environmental consultant - 2 wheeled, Environmental consultant - 4 wheeled, and bed side commode  PLOF: Independent, driving  PATIENT GOALS: To have her right arm be more steady  OBJECTIVE:   HAND DOMINANCE: Right  ADLs: Transfers/ambulation related to ADLs: Eating: independent with the left hand, has difficulty using dominant right hand 2/2 incoordination Grooming: Independent UB Dressing: Independent, increased time LB Dressing: Independent, increased time Toileting: Independent Bathing: Supervision, uses shower seat Tub Shower transfers: Supervision   IADLs: Shopping: Family-moved back home yesterday 2/2 Husband having COVID when Pt. D/d's from inpatient rehab Light housekeeping: Independent Meal Prep: Has not had a chance to perform meal prep yet. Community mobility: Relies  on family Medication management: independent Financial management: Husband assist 2/2  difficulty with  writing checks. Handwriting: 25% legible  MOBILITY STATUS: Needs Assist: uses a RW  ACTIVITY TOLERANCE: Activity tolerance: Fatigues after greater than 30 min. Of activity  FUNCTIONAL OUTCOME MEASURES:  FOTO: 67, TR score:  79  MAM-20: Sum score: 69/80:  Items scored 2 indicated as very hard to do:  Cutting nails, cutting meat, writing 3-4 sentences legibly, picking up a 1/2 full water pitcher  Items scored: as 3 indicated as a little hard to do:  Opening a wide mouth jar, using a fork/spoon, dialing/keying in phone numbers  UPPER EXTREMITY ROM:    Active ROM Right Eval WNL Left Eval WNL  Shoulder flexion    Shoulder abduction    Shoulder adduction    Shoulder extension    Shoulder internal rotation    Shoulder external rotation    Elbow flexion    Elbow extension    Wrist flexion    Wrist extension    Wrist ulnar deviation    Wrist radial deviation    Wrist pronation    Wrist supination    (Blank rows = not tested)  UPPER EXTREMITY MMT:     MMT Right eval Left eval  Shoulder flexion 5/5 5/5  Shoulder abduction 5/5 5/5  Shoulder adduction    Shoulder extension    Shoulder internal rotation    Shoulder external rotation    Middle trapezius    Lower trapezius    Elbow flexion 5/5 5/5  Elbow extension 5/5 5/5  Wrist flexion    Wrist extension 5/5 5/5  Wrist ulnar deviation    Wrist radial deviation    Wrist pronation    Wrist supination    (Blank rows = not tested)  HAND FUNCTION: Grip strength: Right: 43 lbs; Left: 45 lbs, Lateral pinch: Right: 15 lbs, Left: 11 lbs, and 3 point pinch: Right: 9 lbs, Left: 8 lbs  COORDINATION: 9 Hole Peg test: Right: 54 sec. With multiple pegs dropped. Left: 32 sec  SENSATION: Light touch: WFL Proprioception: WFL  EDEMA: N/A  MUSCLE TONE: Intact  COGNITION: Overall cognitive status: Within functional limits for tasks assessed  VISION: Subjective report:  No vision changes Baseline vision: Wears glasses for reading only   PERCEPTION: WFL  PRAXIS: WFL  TODAY'S TREATMENT:                                                                                                                              DATE: 10/15/2022   Neuro  re-ed:  Pt. worked on right hand [facility] skills grasping 1" sticks, 1/4" collars, and 1/4" washers. Pt. worked on storing the objects in the palm, and translatory skills moving the items from the palm of the hand to the tip of the 2nd digit, and thumb. Pt. worked on removing the pegs using bilateral alternating hand patterns.   Self Care:  Pt. worked on Arts development officer using standard pen and lined paper. Pt. worked on prewriting tasks using curved, and  multidirectional block mazes 1/2" thick, and tracing curved lines. Pt. Worked on formulating lists of words, after first tracing them. Pt.  Was able to copy, and formulate 4 sentences in 4 min. &11 sec.  PATIENT EDUCATION: Education details: HEP progression (handwriting) Person educated: Patient Education method: Medical illustrator Education comprehension: verbalized understanding, returned demonstration, and needs further education  HOME EXERCISE PROGRAM:  Right hand strengthening with green theraputty, handwriting  GOALS: Goals reviewed with patient? Yes  SHORT TERM GOALS: Target date: 11/12/2022     Pt. Will be independent with HEPs for the RUE hand function Baseline: EVal: No current HEPs Goal status: INITIAL   LONG TERM GOALS: Target date: 12/24/2022    Pt. Will improve MAM-20 sum score by 5 points for improved hand function Baseline: Eval: MAM-20 Sum score: 69/80 Goal status: INITIAL  2.  Pt. will increase FOTO score by 2 points to reflect Pt. perceived improvement with assessment specific ADL/IADL's.  Baseline: Eval: FOTO score: 67 Goal status: INITIAL  3.  Pt. Will be able to write 1  sentence with 75% legibility  in preparation for written correspondence Baseline: Eval: Name only 25% legibility Goal status: INITIAL  4.  Pt. Will improve right grip strength by 5# to be able to securely hold items without dropping them Baseline: Eval: Right: 43#, Left: 45# Goal status: INITIAL  5.  Pt. Will  improve right hand [facility] skills by 3 sec. On the 9-hole peg test to be able to manipulate small objects Baseline: Eval: Right: 54 sec. With dropping multiple pegs, L: 32sec. Goal status: INITIAL  6.  Pt. Will be independently, and efficiently using her right hand to use utensils to cut, and eat her food Baseline:  Eval: Pt. Presents with difficulty using her right hand to use utensils. Goal status: INITIAL  ASSESSMENT:  CLINICAL IMPRESSION:  Pt. was able to formulate 4 sentences in 4 min. & 11 sec. with 25-50% legibility. Pt. was able to navigate through the curved, and the multidirectional block mazes without deviating over the line borders. Pt. Was able to trace, and formulate lists of words with 25% legibility. Pt. Deviated multiple times from the line when tracing curved lines. Pt. dropped multiple 1" sticks, flat washers, and collars from the tips of her fingers when attempting to perform translatory movements moving them through her hand. Pt. continues to benefit from OT services for ADL training, A/E training, there. Ex., neuromuscular re-education, and Pt. education in order to improve RUE functioning, and increase engagement in ADL/IADL tasks, and maximize overall independence.   PERFORMANCE DEFICITS: in functional skills including ADLs, IADLs, coordination, dexterity, and strength.   IMPAIRMENTS: are limiting patient from ADLs, IADLs, leisure, and social participation.   CO-MORBIDITIES: may have co-morbidities  that affects occupational performance. Patient will benefit from skilled OT to address above impairments and improve overall function.  MODIFICATION OR ASSISTANCE TO COMPLETE EVALUATION: Min-Moderate modification of tasks or assist with assess necessary to complete an evaluation.  OT OCCUPATIONAL PROFILE AND HISTORY: Detailed assessment: Review of records and additional review of physical, cognitive, psychosocial history related to current functional performance.  CLINICAL  DECISION MAKING: Moderate - several treatment options, min-mod task modification necessary  REHAB POTENTIAL: Good  EVALUATION COMPLEXITY: Moderate    PLAN:  OT FREQUENCY: 2x/week  OT DURATION: 12 weeks  PLANNED INTERVENTIONS: self care/ADL training, therapeutic exercise, therapeutic activity, neuromuscular re-education, manual therapy, functional mobility training, paraffin, moist heat, contrast bath, and patient/family education  RECOMMENDED OTHER SERVICES: PT  CONSULTED  AND AGREED WITH PLAN OF CARE: Patient  PLAN FOR NEXT SESSION: see above  Olegario Messier, MS, OTR/L   10/15/2022, 8:41 AM

## 2022-10-16 ENCOUNTER — Ambulatory Visit: Payer: Medicare HMO | Admitting: Occupational Therapy

## 2022-10-16 ENCOUNTER — Ambulatory Visit: Payer: Medicare HMO

## 2022-10-16 DIAGNOSIS — R262 Difficulty in walking, not elsewhere classified: Secondary | ICD-10-CM

## 2022-10-16 DIAGNOSIS — R278 Other lack of coordination: Secondary | ICD-10-CM

## 2022-10-16 DIAGNOSIS — R269 Unspecified abnormalities of gait and mobility: Secondary | ICD-10-CM

## 2022-10-16 DIAGNOSIS — M6281 Muscle weakness (generalized): Secondary | ICD-10-CM

## 2022-10-16 DIAGNOSIS — I639 Cerebral infarction, unspecified: Secondary | ICD-10-CM | POA: Diagnosis not present

## 2022-10-16 DIAGNOSIS — R2689 Other abnormalities of gait and mobility: Secondary | ICD-10-CM | POA: Diagnosis not present

## 2022-10-16 DIAGNOSIS — I63549 Cerebral infarction due to unspecified occlusion or stenosis of unspecified cerebellar artery: Secondary | ICD-10-CM | POA: Diagnosis not present

## 2022-10-16 NOTE — Therapy (Signed)
OUTPATIENT PHYSICAL THERAPY TREATMENT    Patient Name: Susan Farrell MRN: [id number] DOB:November 22, 1945, 77 y.o., female Today's Date: 10/16/2022   PCP: Barbette Reichmann, MD REFERRING PROVIDER: Charlton Amor, PA-C Purpose for referral, evaluation, treatment: Rehabilitation.   PT End of Session - 10/16/22 0851     Visit Number 5    Number of Visits 16    Date for PT Re-Evaluation 11/26/22    Authorization Type [employer]    Authorization Time Period 10/01/22-11/26/22    Progress Note Due on Visit 10    PT Start Time 0845    Equipment Utilized During Treatment Gait belt    Activity Tolerance Patient tolerated treatment well;No increased pain    Behavior During Therapy [facility] for tasks assessed/performed                   Past Medical History:  Diagnosis Date   Hypertension    Stroke [facility])    Past Surgical History:  Procedure Laterality Date   ABDOMINAL HYSTERECTOMY     CHOLECYSTECTOMY     ENDOVASCULAR REPAIR/STENT GRAFT N/A 06/12/2020   Procedure: ENDOVASCULAR REPAIR/STENT GRAFT;  Surgeon: Renford Dills, MD;  Location: [facility] INVASIVE CV LAB;  Service: Cardiovascular;  Laterality: N/A;     ONSET DATE: 08/28/21  REFERRING DIAG: I63.81 (ICD-10-CM) - 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)  THERAPY DIAG:  Muscle weakness (generalized)  Other lack of coordination  Cerebellar stroke, acute (HCC)  Difficulty in walking, not elsewhere classified  Abnormality of gait and mobility  Rationale for Evaluation and Treatment Rehabilitation  SUBJECTIVE:                                                                                                                                                                                             SUBJECTIVE STATEMENT: Patient reports she has been working on her walking. States no new issues.     PERTINENT HISTORY:   Pang sustained a cerebelar CVA in August 2024, rapid progression in  symptom improved over first week, DC acute to CIR, then home ot son's house for 5 days and finally back ot her house 2 days prior to this eval. Pt has been using RW at home since DC, no rollator use yet.   PAIN:  Are you having pain? No  PRECAUTIONS: Fall  WEIGHT BEARING RESTRICTIONS No  FALLS: Has patient fallen in last 6 months? No   LIVING ENVIRONMENT: Lives with: lives with their family and lives with their partner Lives in: House/apartment Stairs: Yes: External: 1 steps; none Has following equipment at home: Dan Humphreys - 2 wheeled, Environmental consultant -  4 wheeled, bed side commode, and only uses bed side commode at night   PLOF: Independent, Independent with basic ADLs, Independent with household mobility without device, Independent with community mobility without device, Independent with gait, and Leisure: going to church, going to grocery and department stores.   PATIENT GOALS Get back to normal walking and balance without any support.   OBJECTIVE:   FUNCTIONAL TESTs:  5 times sit to stand: 19 sec hands on knees   Last years 6 minute walk test: 234 feet and requires seated rest break due to lower extremity fatigue on the left lower extremity  Last years Berg Balance Scale: 36  PATIENT SURVEYS:  FOTO 42  TODAY'S TREATMENT:   Therex:  Standing LE strengthening using Matrix cable system (2.5 #) - 2 sets of 10 reps each LE with several seated rest break due to fatigue - SLR -Hip ABD -Hip ext   Seated assisted ham curl using sliding red disc 2 x 10 reps   Ambulate 1600 ft with RW- focusing on heel strike (Minimal VC today)   Demonstrated some supine/sidelye/prone therex for patient to attempt at home and printed handout- will review and perform next visit.       PATIENT EDUCATION: Education details: POC Person educated: Patient Education method: Explanation Education comprehension: verbalized understanding   HOME EXERCISE PROGRAM:  Access Code: [email or web address] URL:  [email or web address] Date: 10/16/2022 Prepared by: Maureen Ralphs  Exercises - Supine Active Straight Leg Raise  - 1 x daily - 3 x weekly - 3 sets - 10 reps - Supine Hip Abduction AROM  - 1 x daily - 3 x weekly - 3 sets - 10 reps - Sidelying Hip Abduction  - 1 x daily - 3 x weekly - 3 sets - 10 reps - Sidelying Hip Abduction  - 1 x daily - 3 x weekly - 3 sets - 10 reps - Sidelying Hip Extension in Abduction  - 1 x daily - 3 x weekly - 3 sets - 10 reps - Prone Hip Extension  - 1 x daily - 3 x weekly - 3 sets - 10 reps    GOALS:  Goals reviewed with patient? Yes  SHORT TERM GOALS: Target date: 10/29/22  Patient will be independent in updated home exercise program to improve strength/mobility for better functional independence with ADLs. Baseline:  eval: still working on CIR HEP  Goal status: INITIAL  LONG TERM GOALS: Target date: 11/26/22 Patient will improve FOTO score by >14 to indicate subjective reduction in difficulty performing ADL/IADL tasks/mobility.  Baseline: eval: 42 Goal status: INITIAL   2.  Patient will demonstrate 5xSTS improvement in time and use of UE support to indicate improved power, strength, motor control, and reduced pain/dizziness.  Baseline eval: 19sec Goal status: INITIAL   3.   The pt will improve to >0.40m/s to improve ability to safely participate in typical work and/or leisure  Baseline: eval: 24.83sec c RW  Goal status: INITIAL   4.  Patient will improve score objective balance assessment by > 6 points in BERG test to indicate reduced falls risk and improved safety at home.   Baseline: 9/17: 25/ 56 Goal status: INITIAL    ASSESSMENT: Patient presented with good motivation for today's session. Today's focus was on functional LE strengthening to assist with left LE weight bearing and improved left LE swing through. She was challenged with resistive cable exercises- mostly exhibiting general weakness and fatigue with Left  LE. She required increased VC to try  to limit hyperextension of left LE with weight bearing and tried hard to control left knee with standing and performing any dynamic Right LE activity. Improved quad control with practice.   Patient would benefit from skilled physical therapy to increase stability and mobility for improved quality of life.    OBJECTIVE IMPAIRMENTS Abnormal gait, decreased activity tolerance, decreased balance, decreased coordination, decreased endurance, decreased mobility, difficulty walking, and decreased strength.   ACTIVITY LIMITATIONS lifting, bending, standing, squatting, stairs, dressing, and caring for others  PARTICIPATION LIMITATIONS: meal prep, cleaning, driving, shopping, yard work, and church  PERSONAL FACTORS Age are also affecting patient's functional outcome.   REHAB POTENTIAL: Good  CLINICAL DECISION MAKING: Evolving/moderate complexity  EVALUATION COMPLEXITY: Moderate  PLAN: PT FREQUENCY: 2x/week  PT DURATION: 8 weeks  PLANNED INTERVENTIONS: Therapeutic exercises, Therapeutic activity, Neuromuscular re-education, Balance training, Gait training, Patient/Family education, Self Care, Joint mobilization, Dry Needling, and Manual therapy  PLAN FOR NEXT SESSION:  Continue with progressive LE strengthening (review and practice HEP provided at end of session today), gait/balance training.     9:06 AM, 10/16/22 Lenda Kelp PT  Physical Therapist - [facility]  Outpatient Physical Therapy- Main Campus [phone]

## 2022-10-16 NOTE — Therapy (Signed)
OUTPATIENT OCCUPATIONAL THERAPY NEURO TREATMENT  Patient Name: Susan Farrell MRN: [id number] DOB:November 07, 1945, 77 y.o., female Today's Date: 10/16/2022  PCP: Dr. Marcello Fennel REFERRING PROVIDER: Mariam Dollar, [place]  END OF SESSION:  OT End of Session - 10/16/22 0946     Visit Number 5    Number of Visits 24    Date for OT Re-Evaluation 12/24/22    OT Start Time 0930    OT Stop Time 1015    OT Time Calculation (min) 45 min    Activity Tolerance Patient tolerated treatment well;No increased pain    Behavior During Therapy [facility] for tasks assessed/performed             Past Medical History:  Diagnosis Date   Hypertension    Stroke [facility])    Past Surgical History:  Procedure Laterality Date   ABDOMINAL HYSTERECTOMY     CHOLECYSTECTOMY     ENDOVASCULAR REPAIR/STENT GRAFT N/A 06/12/2020   Procedure: ENDOVASCULAR REPAIR/STENT GRAFT;  Surgeon: Renford Dills, MD;  Location: [facility] INVASIVE CV LAB;  Service: Cardiovascular;  Laterality: N/A;   Patient Active Problem List   Diagnosis Date Noted   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) 09/12/2022   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) 09/09/2022   History of abdominal aortic aneurysm (AAA) repair 09/09/2022   HTN (hypertension) 09/09/2022   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   Anemia of chronic disease 09/09/2022   Tobacco abuse 09/09/2022   Thyroid nodule 09/09/2022   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) 09/02/2021   Hypertensive urgency 08/29/2021   Dyslipidemia 08/29/2021   Hypothyroidism 08/29/2021   Cerebellar stroke, acute (HCC) 08/28/2021   Ruptured abdominal aortic aneurysm (AAA) (HCC) 06/12/2020   Goiter 09/13/2019   Osteopenia 09/13/2019    ONSET DATE: 09/08/2022  REFERRING DIAG: Cerebellar CVA  THERAPY DIAG:  Muscle weakness (generalized)  Rationale for Evaluation and Treatment: Rehabilitation  SUBJECTIVE:    SUBJECTIVE STATEMENT:  Pt. reports wanting to improve her handwriting legibility to be able to fill out checks, so that her son does not have to do it for her.  Pt accompanied by: self  PERTINENT HISTORY:   Pt. is a 77 y.o. female who had a Cerbellar CVA, Occlusion of the right cerebellar artery. 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 Pt. 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 No  FALLS: Has patient fallen in last 6 months? No  LIVING ENVIRONMENT: Lives with: lives with their spouse Lives in: House/apartment, one story home Stairs: Yes: External: 1 steps; none Has following equipment at home: Counselling psychologist, Environmental consultant - 2 wheeled, Environmental consultant - 4 wheeled, and bed side commode  PLOF: Independent, driving  PATIENT GOALS: To have her right arm be more steady  OBJECTIVE:   HAND DOMINANCE: Right  ADLs: Transfers/ambulation related to ADLs: Eating: independent with the left hand, has difficulty using dominant right hand 2/2 incoordination Grooming: Independent UB Dressing: Independent, increased time LB Dressing: Independent, increased time Toileting: Independent Bathing: Supervision, uses shower seat Tub Shower transfers: Supervision   IADLs: Shopping: Family-moved back home yesterday 2/2 Husband having COVID when Pt. D/d's from inpatient rehab Light housekeeping: Independent Meal Prep: Has not had a chance to perform meal prep yet. Community mobility: Relies  on family Medication management: independent Financial management: Husband  assist 2/2  difficulty with writing checks. Handwriting: 25% legible  MOBILITY STATUS: Needs Assist: uses a RW  ACTIVITY TOLERANCE: Activity tolerance: Fatigues after greater than 30 min. Of activity  FUNCTIONAL OUTCOME MEASURES:  FOTO:  67, TR score: 79  MAM-20: Sum score: 69/80:  Items scored 2 indicated as very hard to do:  Cutting nails, cutting meat, writing 3-4 sentences legibly, picking up a 1/2 full water pitcher  Items scored: as 3 indicated as a little hard to do:  Opening a wide mouth jar, using a fork/spoon, dialing/keying in phone numbers  UPPER EXTREMITY ROM:    Active ROM Right Eval WNL Left Eval WNL  Shoulder flexion    Shoulder abduction    Shoulder adduction    Shoulder extension    Shoulder internal rotation    Shoulder external rotation    Elbow flexion    Elbow extension    Wrist flexion    Wrist extension    Wrist ulnar deviation    Wrist radial deviation    Wrist pronation    Wrist supination    (Blank rows = not tested)  UPPER EXTREMITY MMT:     MMT Right eval Left eval  Shoulder flexion 5/5 5/5  Shoulder abduction 5/5 5/5  Shoulder adduction    Shoulder extension    Shoulder internal rotation    Shoulder external rotation    Middle trapezius    Lower trapezius    Elbow flexion 5/5 5/5  Elbow extension 5/5 5/5  Wrist flexion    Wrist extension 5/5 5/5  Wrist ulnar deviation    Wrist radial deviation    Wrist pronation    Wrist supination    (Blank rows = not tested)  HAND FUNCTION: Grip strength: Right: 43 lbs; Left: 45 lbs, Lateral pinch: Right: 15 lbs, Left: 11 lbs, and 3 point pinch: Right: 9 lbs, Left: 8 lbs  COORDINATION: 9 Hole Peg test: Right: 54 sec. With multiple pegs dropped. Left: 32 sec  SENSATION: Light touch: WFL Proprioception: WFL  EDEMA: N/A  MUSCLE TONE: Intact  COGNITION: Overall cognitive status: Within functional limits for tasks assessed  VISION: Subjective report:  No vision changes Baseline vision: Wears glasses for reading only   PERCEPTION: WFL  PRAXIS: WFL  TODAY'S TREATMENT:                                                                                                                              DATE:  10/16/2022  Therapeutic Ex:   Pt. Worked on right hand pinch strengthening in the right hand for lateral, and 3pt. pinch using yellow, red, green, blue, and black resistive clips. Pt. worked on placing the clips at various vertical and horizontal angles. Tactile and verbal cues were required for eliciting the desired movement.   Neuro re-ed:  Pt. performed right hand [facility] tasks using the Grooved pegboard. Pt. worked on grasping the grooved pegs from a horizontal position, and moving  the pegs to a vertical position in the hand to prepare for placing them in the grooved slot.     Self Care:  Pt. worked on Community education officer legibly Pt. was able to complete simulated check writing tasks efficiently with 75% legibility for th e first 3 checks, and 25% for other checks. Pt. worked on navigating moderate, and complex mazes with while focusing on completing them without deviating across the small approximately 1/4" path border lines.    PATIENT EDUCATION: Education details: HEP progression (handwriting) Person educated: Patient Education method: Medical illustrator Education comprehension: verbalized understanding, returned demonstration, and needs further education  HOME EXERCISE PROGRAM:  Right hand strengthening with green theraputty, handwriting  GOALS: Goals reviewed with patient? Yes  SHORT TERM GOALS: Target date: 11/12/2022     Pt. Will be independent with HEPs for the RUE hand function Baseline: EVal: No current HEPs Goal status: INITIAL   LONG TERM GOALS: Target date: 12/24/2022    Pt. Will improve MAM-20 sum score by 5 points for improved hand function Baseline: Eval: MAM-20 Sum score: 69/80 Goal status: INITIAL  2.  Pt. will increase FOTO score by 2 points to reflect Pt. perceived improvement with assessment specific ADL/IADL's.  Baseline: Eval: FOTO score: 67 Goal status: INITIAL  3.  Pt. Will be able to write 1  sentence with 75% legibility  in  preparation for written correspondence Baseline: Eval: Name only 25% legibility Goal status: INITIAL  4.  Pt. Will improve right grip strength by 5# to be able to securely hold items without dropping them Baseline: Eval: Right: 43#, Left: 45# Goal status: INITIAL  5.  Pt. Will improve right hand [facility] skills by 3 sec. On the 9-hole peg test to be able to manipulate small objects Baseline: Eval: Right: 54 sec. With dropping multiple pegs, L: 32sec. Goal status: INITIAL  6.  Pt. Will be independently, and efficiently using her right hand to use utensils to cut, and eat her food Baseline:  Eval: Pt. Presents with difficulty using her right hand to use utensils. Goal status: INITIAL  ASSESSMENT:  CLINICAL IMPRESSION:  Pt. was able to complete the first 3 checks with 75% legibility, and 25% legibility for the remaining checks. Pt. deviated across the maze 1/4" path border frequently when attempting to use a pen to complete it. Pt. presented with less deviation when using a pencil for moderate, and complex mazes. Pt. Could benefit from trying a weighted pen/pencil next visit. Pt. Could benefit from trying a Pt. continues to drop multiple grooved pegs from the palm of her hand during translatory movements, and from the tips of her fingers.  Pt. continues to benefit from OT services for ADL training, A/E training, there. Ex., neuromuscular re-education, and Pt. education in order to improve RUE functioning, and increase engagement in ADL/IADL tasks, and maximize overall independence.   PERFORMANCE DEFICITS: in functional skills including ADLs, IADLs, coordination, dexterity, and strength.   IMPAIRMENTS: are limiting patient from ADLs, IADLs, leisure, and social participation.   CO-MORBIDITIES: may have co-morbidities  that affects occupational performance. Patient will benefit from skilled OT to address above impairments and improve overall function.  MODIFICATION OR ASSISTANCE TO COMPLETE  EVALUATION: Min-Moderate modification of tasks or assist with assess necessary to complete an evaluation.  OT OCCUPATIONAL PROFILE AND HISTORY: Detailed assessment: Review of records and additional review of physical, cognitive, psychosocial history related to current functional performance.  CLINICAL DECISION MAKING: Moderate - several treatment options, min-mod task modification necessary  REHAB POTENTIAL: Good  EVALUATION COMPLEXITY: Moderate    PLAN:  OT FREQUENCY: 2x/week  OT DURATION: 12 weeks  PLANNED INTERVENTIONS: self care/ADL training, therapeutic exercise, therapeutic activity, neuromuscular re-education, manual therapy, functional mobility training, paraffin, moist heat, contrast bath, and patient/family education  RECOMMENDED OTHER SERVICES: PT  CONSULTED AND AGREED WITH PLAN OF CARE: Patient  PLAN FOR NEXT SESSION: see above  Olegario Messier, MS, OTR/L   10/16/2022, 9:53 AM

## 2022-10-21 ENCOUNTER — Ambulatory Visit: Payer: Medicare HMO | Admitting: Occupational Therapy

## 2022-10-21 ENCOUNTER — Ambulatory Visit: Payer: Medicare HMO | Attending: Physician Assistant

## 2022-10-21 DIAGNOSIS — I639 Cerebral infarction, unspecified: Secondary | ICD-10-CM | POA: Insufficient documentation

## 2022-10-21 DIAGNOSIS — R2689 Other abnormalities of gait and mobility: Secondary | ICD-10-CM | POA: Diagnosis not present

## 2022-10-21 DIAGNOSIS — M6281 Muscle weakness (generalized): Secondary | ICD-10-CM

## 2022-10-21 DIAGNOSIS — R278 Other lack of coordination: Secondary | ICD-10-CM | POA: Insufficient documentation

## 2022-10-21 DIAGNOSIS — I63549 Cerebral infarction due to unspecified occlusion or stenosis of unspecified cerebellar artery: Secondary | ICD-10-CM | POA: Insufficient documentation

## 2022-10-21 DIAGNOSIS — R262 Difficulty in walking, not elsewhere classified: Secondary | ICD-10-CM | POA: Insufficient documentation

## 2022-10-21 DIAGNOSIS — R269 Unspecified abnormalities of gait and mobility: Secondary | ICD-10-CM | POA: Insufficient documentation

## 2022-10-21 DIAGNOSIS — I6381 Other cerebral infarction due to occlusion or stenosis of small artery: Secondary | ICD-10-CM | POA: Diagnosis not present

## 2022-10-21 NOTE — Therapy (Signed)
OUTPATIENT PHYSICAL THERAPY TREATMENT    Patient Name: Susan Farrell MRN: [id number] DOB:January 22, 1945, 77 y.o., female Today's Date: 10/21/2022   PCP: Barbette Reichmann, MD REFERRING PROVIDER: Charlton Amor, PA-C Purpose for referral, evaluation, treatment: Rehabilitation.   PT End of Session - 10/21/22 0942     Visit Number 6    Number of Visits 16    Date for PT Re-Evaluation 11/26/22    Authorization Type [employer]    Authorization Time Period 10/01/22-11/26/22    Progress Note Due on Visit 10    PT Start Time 0932    PT Stop Time 1014    PT Time Calculation (min) 42 min    Equipment Utilized During Treatment Gait belt    Activity Tolerance Patient tolerated treatment well;No increased pain    Behavior During Therapy [facility] for tasks assessed/performed                    Past Medical History:  Diagnosis Date   Hypertension    Stroke [facility])    Past Surgical History:  Procedure Laterality Date   ABDOMINAL HYSTERECTOMY     CHOLECYSTECTOMY     ENDOVASCULAR REPAIR/STENT GRAFT N/A 06/12/2020   Procedure: ENDOVASCULAR REPAIR/STENT GRAFT;  Surgeon: Renford Dills, MD;  Location: [facility] INVASIVE CV LAB;  Service: Cardiovascular;  Laterality: N/A;     ONSET DATE: 08/28/21  REFERRING DIAG: I63.81 (ICD-10-CM) - 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)  THERAPY DIAG:  Muscle weakness (generalized)  Other lack of coordination  Difficulty in walking, not elsewhere classified  Abnormality of gait and mobility  Other abnormalities of gait and mobility  Cerebellar stroke, acute (HCC)  Rationale for Evaluation and Treatment Rehabilitation  SUBJECTIVE:                                                                                                                                                                                             SUBJECTIVE STATEMENT: Patient reports some soreness after last visit but doing okay today.    PERTINENT HISTORY:   Lorelle sustained a cerebelar CVA in August 2024, rapid progression in symptom improved over first week, DC acute to CIR, then home ot son's house for 5 days and finally back ot her house 2 days prior to this eval. Pt has been using RW at home since DC, no rollator use yet.   PAIN:  Are you having pain? No  PRECAUTIONS: Fall  WEIGHT BEARING RESTRICTIONS No  FALLS: Has patient fallen in last 6 months? No   LIVING ENVIRONMENT: Lives with: lives with their family and lives with their  partner Lives in: House/apartment Stairs: Yes: External: 1 steps; none Has following equipment at home: Dan Humphreys - 2 wheeled, Walker - 4 wheeled, bed side commode, and only uses bed side commode at night   PLOF: Independent, Independent with basic ADLs, Independent with household mobility without device, Independent with community mobility without device, Independent with gait, and Leisure: going to church, going to grocery and department stores.   PATIENT GOALS Get back to normal walking and balance without any support.   OBJECTIVE:   FUNCTIONAL TESTs:  5 times sit to stand: 19 sec hands on knees   Last years 6 minute walk test: 234 feet and requires seated rest break due to lower extremity fatigue on the left lower extremity  Last years Berg Balance Scale: 36  PATIENT SURVEYS:  FOTO 42  TODAY'S TREATMENT:   Therex:   Sit to stand from edge of mat- 10 reps at 24  in height; 5 reps at 23 in; 5 reps at 22 in and 5 reps at 21 in  Hip strengthening:  - sidelye Hip abd Left x10 reps AAROM and 5 AROM- patietn able to minimally lift LE off mat.  - Supine hip abd using transfer board and pillow case under left heel x 12 reps - Supine SLR Left LE (AROM)  - 12 reps (improved height with practice)  - Supine ham curl (hip flex +knee flex) with left heel on pillowcase on transfer board (AA to keep leg in line on board) x 15 reps   Ambulate 1600 ft with RW- focusing on heel strike  (Minimal VC today)   Forward/retro steps using RW- approx 12 feet (counted 10 steps total forward and 15-16 steps retro) focusing on reciprocal steps.        PATIENT EDUCATION: Education details: POC Person educated: Patient Education method: Explanation Education comprehension: verbalized understanding   HOME EXERCISE PROGRAM:  Access Code: [email or web address] URL: [email or web address] Date: 10/16/2022 Prepared by: Maureen Ralphs  Exercises - Supine Active Straight Leg Raise  - 1 x daily - 3 x weekly - 3 sets - 10 reps - Supine Hip Abduction AROM  - 1 x daily - 3 x weekly - 3 sets - 10 reps - Sidelying Hip Abduction  - 1 x daily - 3 x weekly - 3 sets - 10 reps - Sidelying Hip Abduction  - 1 x daily - 3 x weekly - 3 sets - 10 reps - Sidelying Hip Extension in Abduction  - 1 x daily - 3 x weekly - 3 sets - 10 reps - Prone Hip Extension  - 1 x daily - 3 x weekly - 3 sets - 10 reps    GOALS:  Goals reviewed with patient? Yes  SHORT TERM GOALS: Target date: 10/29/22  Patient will be independent in updated home exercise program to improve strength/mobility for better functional independence with ADLs. Baseline:  eval: still working on CIR HEP  Goal status: INITIAL  LONG TERM GOALS: Target date: 11/26/22 Patient will improve FOTO score by >14 to indicate subjective reduction in difficulty performing ADL/IADL tasks/mobility.  Baseline: eval: 42 Goal status: INITIAL   2.  Patient will demonstrate 5xSTS improvement in time and use of UE support to indicate improved power, strength, motor control, and reduced pain/dizziness.  Baseline eval: 19sec Goal status: INITIAL   3.   The pt will improve to >0.52m/s to improve ability to safely participate in typical work and/or leisure  Baseline: eval: 24.83sec c RW  Goal status: INITIAL  4.  Patient will improve score objective balance assessment by > 6 points in BERG test to indicate reduced falls risk and improved  safety at home.   Baseline: 9/17: 25/ 56 Goal status: INITIAL    ASSESSMENT: Patient performed well with instruction in laying down LE strengthening that can be completed at home. She performed well- challenged with all LE exercises involving gravity- able to raise Left LE with some effort and did improve overall with practice.  She continues to require less VC for picking up left LE and swing through gait. Still working on limiting hyperextension with standing/weight bearing.  Patient would benefit from skilled physical therapy to increase stability and mobility for improved quality of life.    OBJECTIVE IMPAIRMENTS Abnormal gait, decreased activity tolerance, decreased balance, decreased coordination, decreased endurance, decreased mobility, difficulty walking, and decreased strength.   ACTIVITY LIMITATIONS lifting, bending, standing, squatting, stairs, dressing, and caring for others  PARTICIPATION LIMITATIONS: meal prep, cleaning, driving, shopping, yard work, and church  PERSONAL FACTORS Age are also affecting patient's functional outcome.   REHAB POTENTIAL: Good  CLINICAL DECISION MAKING: Evolving/moderate complexity  EVALUATION COMPLEXITY: Moderate  PLAN: PT FREQUENCY: 2x/week  PT DURATION: 8 weeks  PLANNED INTERVENTIONS: Therapeutic exercises, Therapeutic activity, Neuromuscular re-education, Balance training, Gait training, Patient/Family education, Self Care, Joint mobilization, Dry Needling, and Manual therapy  PLAN FOR NEXT SESSION:  Continue with progressive LE strengthening, gait/balance training.     12:02 PM, 10/21/22 Lenda Kelp PT  Physical Therapist -  [facility]  Outpatient Physical Therapy- Main Campus [phone]

## 2022-10-21 NOTE — Therapy (Addendum)
OUTPATIENT OCCUPATIONAL THERAPY NEURO TREATMENT  Patient Name: Susan Farrell MRN: [id number] DOB:06-Dec-1945, 77 y.o., female Today's Date: 10/21/2022  PCP: Dr. Marcello Fennel REFERRING PROVIDER: Mariam Dollar, [place]  END OF SESSION:  OT End of Session - 10/21/22 0848     Visit Number 6    Number of Visits 24    Date for OT Re-Evaluation 12/24/22    OT Start Time 0845    OT Stop Time 0930    OT Time Calculation (min) 45 min    Activity Tolerance Patient tolerated treatment well;No increased pain    Behavior During Therapy [facility] for tasks assessed/performed             Past Medical History:  Diagnosis Date   Hypertension    Stroke [facility])    Past Surgical History:  Procedure Laterality Date   ABDOMINAL HYSTERECTOMY     CHOLECYSTECTOMY     ENDOVASCULAR REPAIR/STENT GRAFT N/A 06/12/2020   Procedure: ENDOVASCULAR REPAIR/STENT GRAFT;  Surgeon: Renford Dills, MD;  Location: [facility] INVASIVE CV LAB;  Service: Cardiovascular;  Laterality: N/A;   Patient Active Problem List   Diagnosis Date Noted   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) 09/12/2022   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) 09/09/2022   History of abdominal aortic aneurysm (AAA) repair 09/09/2022   HTN (hypertension) 09/09/2022   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   Anemia of chronic disease 09/09/2022   Tobacco abuse 09/09/2022   Thyroid nodule 09/09/2022   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) 09/02/2021   Hypertensive urgency 08/29/2021   Dyslipidemia 08/29/2021   Hypothyroidism 08/29/2021   Cerebellar stroke, acute (HCC) 08/28/2021   Ruptured abdominal aortic aneurysm (AAA) (HCC) 06/12/2020   Goiter 09/13/2019   Osteopenia 09/13/2019    ONSET DATE: 09/08/2022  REFERRING DIAG: Cerebellar CVA  THERAPY DIAG:  Muscle weakness (generalized)  Other lack of coordination  Rationale for Evaluation and Treatment:  Rehabilitation  SUBJECTIVE:   SUBJECTIVE STATEMENT:  Pt. reports doing church, and choir related tasks over the weekend to prepare for her church's 100 year celebration.  Pt accompanied by: self  PERTINENT HISTORY:   Pt. is a 77 y.o. female who had a Cerbellar CVA, Occlusion of the right cerebellar artery. 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 Pt. 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 No  FALLS: Has patient fallen in last 6 months? No  LIVING ENVIRONMENT: Lives with: lives with their spouse Lives in: House/apartment, one story home Stairs: Yes: External: 1 steps; none Has following equipment at home: Counselling psychologist, Environmental consultant - 2 wheeled, Environmental consultant - 4 wheeled, and bed side commode  PLOF: Independent, driving  PATIENT GOALS: To have her right arm be more steady  OBJECTIVE:   HAND DOMINANCE: Right  ADLs: Transfers/ambulation related to ADLs: Eating: independent with the left hand, has difficulty using dominant right hand 2/2 incoordination Grooming: Independent UB Dressing: Independent, increased time LB Dressing: Independent, increased time Toileting: Independent Bathing: Supervision, uses shower seat Tub Shower transfers: Supervision   IADLs: Shopping: Family-moved back home yesterday 2/2 Husband having COVID when Pt. D/d's from inpatient rehab Light housekeeping: Independent Meal Prep: Has not had a chance to perform meal prep yet. Community mobility: Relies  on family Medication management: independent Financial management: Husband assist 2/2  difficulty with writing checks. Handwriting: 25% legible  MOBILITY STATUS: Needs Assist: uses a RW  ACTIVITY TOLERANCE: Activity tolerance: Fatigues after greater than 30 min. Of activity  FUNCTIONAL OUTCOME  MEASURES:  FOTO: 67, TR score: 79  MAM-20: Sum score: 69/80:  Items scored 2 indicated as very hard to do:  Cutting nails, cutting meat, writing 3-4 sentences legibly, picking up a 1/2 full water pitcher  Items scored: as 3 indicated as a little hard to do:  Opening a wide mouth jar, using a fork/spoon, dialing/keying in phone numbers  UPPER EXTREMITY ROM:    Active ROM Right Eval WNL Left Eval WNL  Shoulder flexion    Shoulder abduction    Shoulder adduction    Shoulder extension    Shoulder internal rotation    Shoulder external rotation    Elbow flexion    Elbow extension    Wrist flexion    Wrist extension    Wrist ulnar deviation    Wrist radial deviation    Wrist pronation    Wrist supination    (Blank rows = not tested)  UPPER EXTREMITY MMT:     MMT Right eval Left eval  Shoulder flexion 5/5 5/5  Shoulder abduction 5/5 5/5  Shoulder adduction    Shoulder extension    Shoulder internal rotation    Shoulder external rotation    Middle trapezius    Lower trapezius    Elbow flexion 5/5 5/5  Elbow extension 5/5 5/5  Wrist flexion    Wrist extension 5/5 5/5  Wrist ulnar deviation    Wrist radial deviation    Wrist pronation    Wrist supination    (Blank rows = not tested)  HAND FUNCTION: Grip strength: Right: 43 lbs; Left: 45 lbs, Lateral pinch: Right: 15 lbs, Left: 11 lbs, and 3 point pinch: Right: 9 lbs, Left: 8 lbs  COORDINATION: 9 Hole Peg test: Right: 54 sec. With multiple pegs dropped. Left: 32 sec  SENSATION: Light touch: WFL Proprioception: WFL  EDEMA: N/A  MUSCLE TONE: Intact  COGNITION: Overall cognitive status: Within functional limits for tasks assessed  VISION: Subjective report:  No vision changes Baseline vision: Wears glasses for reading only   PERCEPTION: WFL  PRAXIS: WFL  TODAY'S TREATMENT:                                                                                                                               DATE: 10/21/2022  Therapeutic Ex:   Pt. worked on right hand pinch strengthening in the right hand for lateral, and 3pt. pinch using yellow, red, green, blue, and black resistive clips. Pt. worked on placing the clips at various vertical and horizontal angles. Tactile and verbal cues were required for eliciting the desired movement.   Neuro re-ed:  Pt. worked on right hand [facility] skills grasping 1" resistive cubes alternating thumb opposition to the tip of the 2nd through 5th digits while the board  is placed at a vertical angle. Pt. worked on pressing the cubes back into place while alternating isolated 2nd through 5th digit extension. Pt. performed right hand [facility] tasks using the Grooved pegboard. Pt. worked on grasping the grooved pegs from a horizontal position, and moving the pegs to a vertical position in the hand to prepare for placing them in the grooved slot. Pt. worked on translatory movements grasping, and storing the pegs in the palm of her right hand. Pt. worked on controlled dropping of the pegs 1 at a time from the ulnar aspect of the right hand.   Self Care:  Pt. worked on navigating moderate, and complex mazes with while focusing on completing them without deviating across the small approximately 1/4" path border lines. Pt. worked on Careers information officer during check writing tasks. Pt. was able to complete simulated check writing tasks efficiently with 75% legibility for all checks written.    PATIENT EDUCATION: Education details: HEP progression (handwriting) Person educated: Patient Education method: Medical illustrator Education comprehension: verbalized understanding, returned demonstration, and needs further education  HOME EXERCISE PROGRAM:  Right hand strengthening with green theraputty, handwriting  GOALS: Goals reviewed with patient? Yes  SHORT TERM GOALS: Target date: 11/12/2022     Pt. Will be independent with HEPs for the RUE hand function Baseline:  EVal: No current HEPs Goal status: INITIAL   LONG TERM GOALS: Target date: 12/24/2022    Pt. Will improve MAM-20 sum score by 5 points for improved hand function Baseline: Eval: MAM-20 Sum score: 69/80 Goal status: INITIAL  2.  Pt. will increase FOTO score by 2 points to reflect Pt. perceived improvement with assessment specific ADL/IADL's.  Baseline: Eval: FOTO score: 67 Goal status: INITIAL  3.  Pt. Will be able to write 1  sentence with 75% legibility  in preparation for written correspondence Baseline: Eval: Name only 25% legibility Goal status: INITIAL  4.  Pt. Will improve right grip strength by 5# to be able to securely hold items without dropping them Baseline: Eval: Right: 43#, Left: 45# Goal status: INITIAL  5.  Pt. Will improve right hand [facility] skills by 3 sec. On the 9-hole peg test to be able to manipulate small objects Baseline: Eval: Right: 54 sec. With dropping multiple pegs, L: 32sec. Goal status: INITIAL  6.  Pt. Will be independently, and efficiently using her right hand to use utensils to cut, and eat her food Baseline:  Eval: Pt. Presents with difficulty using her right hand to use utensils. Goal status: INITIAL  ASSESSMENT:  CLINICAL IMPRESSION:  Pt. was able to complete the first all checks with 75% legibility today. Pt. was able to write 4 sentences in 5 min. with 75% legibility.  When attempting maze tasks, Pt. did not deviate across the line of the maze 1/4" path border this date when attempting to use a pen to complete it at a standard pace. Pt.'s line did touch the border wall multiple times. This improved when attempting it at a slower pace. Pt. dropped fewer grooved pegs from the palm of her hand during translatory movements, and from the tips of her fingers. Pt. continues to benefit from OT services for ADL training, A/E training, there. Ex., neuromuscular re-education, and Pt. education in order to improve RUE functioning, and increase engagement in  ADL/IADL tasks, and maximize overall independence.   PERFORMANCE DEFICITS: in functional skills including ADLs, IADLs, coordination, dexterity, and strength.   IMPAIRMENTS: are limiting patient from ADLs, IADLs, leisure, and social  participation.   CO-MORBIDITIES: may have co-morbidities  that affects occupational performance. Patient will benefit from skilled OT to address above impairments and improve overall function.  MODIFICATION OR ASSISTANCE TO COMPLETE EVALUATION: Min-Moderate modification of tasks or assist with assess necessary to complete an evaluation.  OT OCCUPATIONAL PROFILE AND HISTORY: Detailed assessment: Review of records and additional review of physical, cognitive, psychosocial history related to current functional performance.  CLINICAL DECISION MAKING: Moderate - several treatment options, min-mod task modification necessary  REHAB POTENTIAL: Good  EVALUATION COMPLEXITY: Moderate    PLAN:  OT FREQUENCY: 2x/week  OT DURATION: 12 weeks  PLANNED INTERVENTIONS: self care/ADL training, therapeutic exercise, therapeutic activity, neuromuscular re-education, manual therapy, functional mobility training, paraffin, moist heat, contrast bath, and patient/family education  RECOMMENDED OTHER SERVICES: PT  CONSULTED AND AGREED WITH PLAN OF CARE: Patient  PLAN FOR NEXT SESSION: see above  Olegario Messier, MS, OTR/L   10/21/2022, 9:07 AM

## 2022-10-23 ENCOUNTER — Ambulatory Visit: Payer: Medicare HMO | Admitting: Occupational Therapy

## 2022-10-23 ENCOUNTER — Ambulatory Visit: Payer: Medicare HMO

## 2022-10-23 DIAGNOSIS — R262 Difficulty in walking, not elsewhere classified: Secondary | ICD-10-CM | POA: Diagnosis not present

## 2022-10-23 DIAGNOSIS — Z09 Encounter for follow-up examination after completed treatment for conditions other than malignant neoplasm: Secondary | ICD-10-CM | POA: Diagnosis not present

## 2022-10-23 DIAGNOSIS — I6381 Other cerebral infarction due to occlusion or stenosis of small artery: Secondary | ICD-10-CM | POA: Diagnosis not present

## 2022-10-23 DIAGNOSIS — Z95828 Presence of other vascular implants and grafts: Secondary | ICD-10-CM | POA: Diagnosis not present

## 2022-10-23 DIAGNOSIS — R2689 Other abnormalities of gait and mobility: Secondary | ICD-10-CM

## 2022-10-23 DIAGNOSIS — Z8673 Personal history of transient ischemic attack (TIA), and cerebral infarction without residual deficits: Secondary | ICD-10-CM | POA: Diagnosis not present

## 2022-10-23 DIAGNOSIS — R278 Other lack of coordination: Secondary | ICD-10-CM

## 2022-10-23 DIAGNOSIS — I10 Essential (primary) hypertension: Secondary | ICD-10-CM | POA: Diagnosis not present

## 2022-10-23 DIAGNOSIS — I639 Cerebral infarction, unspecified: Secondary | ICD-10-CM | POA: Diagnosis not present

## 2022-10-23 DIAGNOSIS — M6281 Muscle weakness (generalized): Secondary | ICD-10-CM | POA: Diagnosis not present

## 2022-10-23 DIAGNOSIS — R269 Unspecified abnormalities of gait and mobility: Secondary | ICD-10-CM

## 2022-10-23 DIAGNOSIS — Z87891 Personal history of nicotine dependence: Secondary | ICD-10-CM | POA: Diagnosis not present

## 2022-10-23 DIAGNOSIS — I63549 Cerebral infarction due to unspecified occlusion or stenosis of unspecified cerebellar artery: Secondary | ICD-10-CM | POA: Diagnosis not present

## 2022-10-23 DIAGNOSIS — D649 Anemia, unspecified: Secondary | ICD-10-CM | POA: Diagnosis not present

## 2022-10-23 NOTE — Therapy (Signed)
OUTPATIENT PHYSICAL THERAPY TREATMENT    Patient Name: Susan Farrell MRN: [id number] DOB:Oct 22, 1945, 77 y.o., female Today's Date: 10/23/2022   PCP: Barbette Reichmann, MD REFERRING PROVIDER: Charlton Amor, PA-C Purpose for referral, evaluation, treatment: Rehabilitation.   PT End of Session - 10/23/22 1107     Visit Number 7    Number of Visits 16    Date for PT Re-Evaluation 11/26/22    Authorization Type [employer]    Authorization Time Period 10/01/22-11/26/22    Progress Note Due on Visit 10    PT Start Time 1102    Equipment Utilized During Treatment Gait belt    Activity Tolerance Patient tolerated treatment well;No increased pain    Behavior During Therapy [facility] for tasks assessed/performed                    Past Medical History:  Diagnosis Date   Hypertension    Stroke [facility])    Past Surgical History:  Procedure Laterality Date   ABDOMINAL HYSTERECTOMY     CHOLECYSTECTOMY     ENDOVASCULAR REPAIR/STENT GRAFT N/A 06/12/2020   Procedure: ENDOVASCULAR REPAIR/STENT GRAFT;  Surgeon: Renford Dills, MD;  Location: [facility] INVASIVE CV LAB;  Service: Cardiovascular;  Laterality: N/A;     ONSET DATE: 08/28/21  REFERRING DIAG: I63.81 (ICD-10-CM) - 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)  THERAPY DIAG:  Muscle weakness (generalized)  Other lack of coordination  Difficulty in walking, not elsewhere classified  Abnormality of gait and mobility  Other abnormalities of gait and mobility  Rationale for Evaluation and Treatment Rehabilitation  SUBJECTIVE:                                                                                                                                                                                             SUBJECTIVE STATEMENT: Patient reports no complaints today and states staying compliant with HEP.   PERTINENT HISTORY:   [name] sustained a cerebelar CVA in August 2024, rapid progression  in symptom improved over first week, DC acute to CIR, then home ot son's house for 5 days and finally back ot her house 2 days prior to this eval. Pt has been using RW at home since DC, no rollator use yet.   PAIN:  Are you having pain? No  PRECAUTIONS: Fall  WEIGHT BEARING RESTRICTIONS No  FALLS: Has patient fallen in last 6 months? No   LIVING ENVIRONMENT: Lives with: lives with their family and lives with their partner Lives in: House/apartment Stairs: Yes: External: 1 steps; none Has following equipment at home: Dan Humphreys - 2 wheeled, Environmental consultant -  4 wheeled, bed side commode, and only uses bed side commode at night   PLOF: Independent, Independent with basic ADLs, Independent with household mobility without device, Independent with community mobility without device, Independent with gait, and Leisure: going to church, going to grocery and department stores.   PATIENT GOALS Get back to normal walking and balance without any support.   OBJECTIVE:   FUNCTIONAL TESTs:  5 times sit to stand: 19 sec hands on knees   Last years 6 minute walk test: 234 feet and requires seated rest break due to lower extremity fatigue on the left lower extremity  Last years Berg Balance Scale: 36  PATIENT SURVEYS:  FOTO 42  TODAY'S TREATMENT:   Therex: Seated hip march AROM each LE x 12 reps (difficulty after 4-5 reps on left with decreasing height with reps) Seated knee ext (AROM) each LE x 12 reps Seated Hip add ball squeeze hold 3 sec x 10 reps  Seated Leg lift (knee flexed to 90) up/over 1/2 foam each LE x 12 reps Sit to stand x 10 reps with good eccentric control (CGA only)  Standing step taps each LE x 15 reps.   Ambulate around 450 ft with RW- focusing on heel strike (Minimal VC today) No foot drag         PATIENT EDUCATION: Education details: POC Person educated: Patient Education method: Explanation Education comprehension: verbalized understanding   HOME EXERCISE PROGRAM:   Access Code: [email or web address] URL: [email or web address] Date: 10/16/2022 Prepared by: Maureen Ralphs  Exercises - Supine Active Straight Leg Raise  - 1 x daily - 3 x weekly - 3 sets - 10 reps - Supine Hip Abduction AROM  - 1 x daily - 3 x weekly - 3 sets - 10 reps - Sidelying Hip Abduction  - 1 x daily - 3 x weekly - 3 sets - 10 reps - Sidelying Hip Abduction  - 1 x daily - 3 x weekly - 3 sets - 10 reps - Sidelying Hip Extension in Abduction  - 1 x daily - 3 x weekly - 3 sets - 10 reps - Prone Hip Extension  - 1 x daily - 3 x weekly - 3 sets - 10 reps    GOALS:  Goals reviewed with patient? Yes  SHORT TERM GOALS: Target date: 10/29/22  Patient will be independent in updated home exercise program to improve strength/mobility for better functional independence with ADLs. Baseline:  eval: still working on CIR HEP  Goal status: INITIAL  LONG TERM GOALS: Target date: 11/26/22 Patient will improve FOTO score by >14 to indicate subjective reduction in difficulty performing ADL/IADL tasks/mobility.  Baseline: eval: 42 Goal status: INITIAL   2.  Patient will demonstrate 5xSTS improvement in time and use of UE support to indicate improved power, strength, motor control, and reduced pain/dizziness.  Baseline eval: 19sec Goal status: INITIAL   3.   The pt will improve to >0.7m/s to improve ability to safely participate in typical work and/or leisure  Baseline: eval: 24.83sec c RW  Goal status: INITIAL   4.  Patient will improve score objective balance assessment by > 6 points in BERG test to indicate reduced falls risk and improved safety at home.   Baseline: 9/17: 25/ 56 Goal status: INITIAL    ASSESSMENT: Patient presented with good motivation for all of today's activities. She remains very responsive to all verbal cues and puts forth good effort despite her left LE strength deficits. She continues to require less VC  for picking up left LE while walking . Patient  would benefit from skilled physical therapy to increase stability and mobility for improved quality of life.    OBJECTIVE IMPAIRMENTS Abnormal gait, decreased activity tolerance, decreased balance, decreased coordination, decreased endurance, decreased mobility, difficulty walking, and decreased strength.   ACTIVITY LIMITATIONS lifting, bending, standing, squatting, stairs, dressing, and caring for others  PARTICIPATION LIMITATIONS: meal prep, cleaning, driving, shopping, yard work, and church  PERSONAL FACTORS Age are also affecting patient's functional outcome.   REHAB POTENTIAL: Good  CLINICAL DECISION MAKING: Evolving/moderate complexity  EVALUATION COMPLEXITY: Moderate  PLAN: PT FREQUENCY: 2x/week  PT DURATION: 8 weeks  PLANNED INTERVENTIONS: Therapeutic exercises, Therapeutic activity, Neuromuscular re-education, Balance training, Gait training, Patient/Family education, Self Care, Joint mobilization, Dry Needling, and Manual therapy  PLAN FOR NEXT SESSION:  Continue with progressive LE strengthening, gait/balance training.     11:07 AM, 10/23/22 Lenda Kelp PT  Physical Therapist - [facility]  Outpatient Physical Therapy- Main Campus [phone]

## 2022-10-23 NOTE — Therapy (Signed)
OUTPATIENT OCCUPATIONAL THERAPY NEURO TREATMENT  Patient Name: Susan Farrell MRN: [id number] DOB:04/10/1945, 77 y.o., female Today's Date: 10/23/2022  PCP: Dr. Marcello Fennel REFERRING PROVIDER: Mariam Dollar, [place]  END OF SESSION:  OT End of Session - 10/23/22 1203     Visit Number 7    Number of Visits 24    Date for OT Re-Evaluation 12/24/22    OT Start Time 1145    OT Stop Time 1230    OT Time Calculation (min) 45 min    Activity Tolerance Patient tolerated treatment well;No increased pain    Behavior During Therapy [facility] for tasks assessed/performed             Past Medical History:  Diagnosis Date   Hypertension    Stroke [facility])    Past Surgical History:  Procedure Laterality Date   ABDOMINAL HYSTERECTOMY     CHOLECYSTECTOMY     ENDOVASCULAR REPAIR/STENT GRAFT N/A 06/12/2020   Procedure: ENDOVASCULAR REPAIR/STENT GRAFT;  Surgeon: Renford Dills, MD;  Location: [facility] INVASIVE CV LAB;  Service: Cardiovascular;  Laterality: N/A;   Patient Active Problem List   Diagnosis Date Noted   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) 09/12/2022   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) 09/09/2022   History of abdominal aortic aneurysm (AAA) repair 09/09/2022   HTN (hypertension) 09/09/2022   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   Anemia of chronic disease 09/09/2022   Tobacco abuse 09/09/2022   Thyroid nodule 09/09/2022   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) 09/02/2021   Hypertensive urgency 08/29/2021   Dyslipidemia 08/29/2021   Hypothyroidism 08/29/2021   Cerebellar stroke, acute (HCC) 08/28/2021   Ruptured abdominal aortic aneurysm (AAA) (HCC) 06/12/2020   Goiter 09/13/2019   Osteopenia 09/13/2019    ONSET DATE: 09/08/2022  REFERRING DIAG: Cerebellar CVA  THERAPY DIAG:  Muscle weakness (generalized)  Rationale for Evaluation and Treatment: Rehabilitation  SUBJECTIVE:    SUBJECTIVE STATEMENT:  Pt. reports having an MD appointment today.  Pt accompanied by: self  PERTINENT HISTORY:   Pt. is a 77 y.o. female who had a Cerbellar CVA, Occlusion of the right cerebellar artery. 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 Pt. 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 No  FALLS: Has patient fallen in last 6 months? No  LIVING ENVIRONMENT: Lives with: lives with their spouse Lives in: House/apartment, one story home Stairs: Yes: External: 1 steps; none Has following equipment at home: Counselling psychologist, Environmental consultant - 2 wheeled, Environmental consultant - 4 wheeled, and bed side commode  PLOF: Independent, driving  PATIENT GOALS: To have her right arm be more steady  OBJECTIVE:   HAND DOMINANCE: Right  ADLs: Transfers/ambulation related to ADLs: Eating: independent with the left hand, has difficulty using dominant right hand 2/2 incoordination Grooming: Independent UB Dressing: Independent, increased time LB Dressing: Independent, increased time Toileting: Independent Bathing: Supervision, uses shower seat Tub Shower transfers: Supervision   IADLs: Shopping: Family-moved back home yesterday 2/2 Husband having COVID when Pt. D/d's from inpatient rehab Light housekeeping: Independent Meal Prep: Has not had a chance to perform meal prep yet. Community mobility: Relies  on family Medication management: independent Financial management: Husband assist 2/2  difficulty with writing checks. Handwriting: 25% legible  MOBILITY STATUS: Needs Assist: uses a RW  ACTIVITY  TOLERANCE: Activity tolerance: Fatigues after greater than 30 min. Of activity  FUNCTIONAL OUTCOME MEASURES:  FOTO: 67, TR score: 79  MAM-20: Sum score: 69/80:  Items scored 2 indicated as very hard to  do:  Cutting nails, cutting meat, writing 3-4 sentences legibly, picking up a 1/2 full water pitcher  Items scored: as 3 indicated as a little hard to do:  Opening a wide mouth jar, using a fork/spoon, dialing/keying in phone numbers  UPPER EXTREMITY ROM:    Active ROM Right Eval WNL Left Eval WNL  Shoulder flexion    Shoulder abduction    Shoulder adduction    Shoulder extension    Shoulder internal rotation    Shoulder external rotation    Elbow flexion    Elbow extension    Wrist flexion    Wrist extension    Wrist ulnar deviation    Wrist radial deviation    Wrist pronation    Wrist supination    (Blank rows = not tested)  UPPER EXTREMITY MMT:     MMT Right eval Left eval  Shoulder flexion 5/5 5/5  Shoulder abduction 5/5 5/5  Shoulder adduction    Shoulder extension    Shoulder internal rotation    Shoulder external rotation    Middle trapezius    Lower trapezius    Elbow flexion 5/5 5/5  Elbow extension 5/5 5/5  Wrist flexion    Wrist extension 5/5 5/5  Wrist ulnar deviation    Wrist radial deviation    Wrist pronation    Wrist supination    (Blank rows = not tested)  HAND FUNCTION: Grip strength: Right: 43 lbs; Left: 45 lbs, Lateral pinch: Right: 15 lbs, Left: 11 lbs, and 3 point pinch: Right: 9 lbs, Left: 8 lbs  COORDINATION: 9 Hole Peg test: Right: 54 sec. With multiple pegs dropped. Left: 32 sec  SENSATION: Light touch: WFL Proprioception: WFL  EDEMA: N/A  MUSCLE TONE: Intact  COGNITION: Overall cognitive status: Within functional limits for tasks assessed  VISION: Subjective report:  No vision changes Baseline vision: Wears glasses for reading only   PERCEPTION: WFL  PRAXIS: WFL  TODAY'S TREATMENT:                                                                                                                              DATE: 10/23/2022  Neuromuscular re-education:  Pt. worked on grasping, and manipulating 1/2" washers  from a magnetic dish using point grasp pattern. Pt. worked on reaching up, stabilizing, and sustaining right shoulder elevation while placing the washer over a small precise target on vertical dowels positioned at various angles. Pt. worked with a 2# cuff weight in place. Pt. worked on translatory movements with the washers, moving them through the right hand. Pt. worked on [facility] skills using the [employer] Task. Pt. worked on sustaining grasp on the resistive tweezers while grasping this sticks, and moving them from a horizontal position  to a vertical position to prepare for placing them into the pegboard. Pt. Required verbal cues, and cues for visual demonstration for wrist position, and hand pattern when placing them into the pegboard. Pt. worked on removing the 1" thin pegs while alternating thumb opposition to the tip of the 2nd digit through thumb.   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   PATIENT EDUCATION: Education details:  [facility] skills Person educated: Patient Education method: Medical illustrator Education comprehension: verbalized understanding, returned demonstration, and needs further education  HOME EXERCISE PROGRAM:  Right hand strengthening with green theraputty, handwriting  GOALS: Goals reviewed with patient? Yes  SHORT TERM GOALS: Target date: 11/12/2022     Pt. Will be independent with HEPs for the RUE hand function Baseline: EVal: No current HEPs Goal status: INITIAL   LONG TERM GOALS: Target date: 12/24/2022    Pt. Will improve MAM-20 sum score by 5 points for improved hand function Baseline: Eval: MAM-20 Sum score: 69/80 Goal status: INITIAL  2.  Pt. will increase FOTO score by 2 points to reflect Pt. perceived improvement with assessment specific ADL/IADL's.  Baseline: Eval: FOTO score: 67 Goal status: INITIAL  3.  Pt. Will  be able to write 1  sentence with 75% legibility  in preparation for written correspondence Baseline: Eval: Name only 25% legibility Goal status: INITIAL  4.  Pt. Will improve right grip strength by 5# to be able to securely hold items without dropping them Baseline: Eval: Right: 43#, Left: 45# Goal status: INITIAL  5.  Pt. Will improve right hand [facility] skills by 3 sec. On the 9-hole peg test to be able to manipulate small objects Baseline: Eval: Right: 54 sec. With dropping multiple pegs, L: 32sec. Goal status: INITIAL  6.  Pt. Will be independently, and efficiently using her right hand to use utensils to cut, and eat her food Baseline:  Eval: Pt. Presents with difficulty using her right hand to use utensils. Goal status: INITIAL  ASSESSMENT:  CLINICAL IMPRESSION:  Pt. required stabilization on the diagonal dowels when attempting to remove the washers from the dowels. Pt. tolerated the 2# weight with less tremoring. Pt. required increased time to use the tweezers to grasp, and reposition the 1" thin pegs. Pt. deviated from the images frequently when attempting to trace them. Pt. was provided with a HEP for [facility] skills. Pt. continues to benefit from OT services for ADL training, A/E training, there. Ex., neuromuscular re-education, and Pt. education in order to improve RUE functioning, and increase engagement in ADL/IADL tasks, and maximize overall independence.   PERFORMANCE DEFICITS: in functional skills including ADLs, IADLs, coordination, dexterity, and strength.   IMPAIRMENTS: are limiting patient from ADLs, IADLs, leisure, and social participation.   CO-MORBIDITIES: may have co-morbidities  that affects occupational performance. Patient will benefit from skilled OT to address above impairments and improve overall function.  MODIFICATION OR ASSISTANCE TO COMPLETE EVALUATION: Min-Moderate modification of tasks or assist with assess necessary to complete an evaluation.  OT OCCUPATIONAL  PROFILE AND HISTORY: Detailed assessment: Review of records and additional review of physical, cognitive, psychosocial history related to current functional performance.  CLINICAL DECISION MAKING: Moderate - several treatment options, min-mod task modification necessary  REHAB POTENTIAL: Good  EVALUATION COMPLEXITY: Moderate    PLAN:  OT FREQUENCY: 2x/week  OT DURATION: 12 weeks  PLANNED INTERVENTIONS: self care/ADL training, therapeutic exercise, therapeutic activity, neuromuscular re-education,  manual therapy, functional mobility training, paraffin, moist heat, contrast bath, and patient/family education  RECOMMENDED OTHER SERVICES: PT  CONSULTED AND AGREED WITH PLAN OF CARE: Patient  PLAN FOR NEXT SESSION: see above  Olegario Messier, MS, OTR/L   10/23/2022, 12:07 PM

## 2022-10-28 ENCOUNTER — Ambulatory Visit: Payer: Medicare HMO | Admitting: Occupational Therapy

## 2022-10-28 ENCOUNTER — Ambulatory Visit: Payer: Medicare HMO | Admitting: Physical Therapy

## 2022-10-28 DIAGNOSIS — R269 Unspecified abnormalities of gait and mobility: Secondary | ICD-10-CM | POA: Diagnosis not present

## 2022-10-28 DIAGNOSIS — M6281 Muscle weakness (generalized): Secondary | ICD-10-CM

## 2022-10-28 DIAGNOSIS — R262 Difficulty in walking, not elsewhere classified: Secondary | ICD-10-CM | POA: Diagnosis not present

## 2022-10-28 DIAGNOSIS — R2689 Other abnormalities of gait and mobility: Secondary | ICD-10-CM | POA: Diagnosis not present

## 2022-10-28 DIAGNOSIS — R278 Other lack of coordination: Secondary | ICD-10-CM

## 2022-10-28 DIAGNOSIS — I63549 Cerebral infarction due to unspecified occlusion or stenosis of unspecified cerebellar artery: Secondary | ICD-10-CM | POA: Diagnosis not present

## 2022-10-28 DIAGNOSIS — I639 Cerebral infarction, unspecified: Secondary | ICD-10-CM | POA: Diagnosis not present

## 2022-10-28 DIAGNOSIS — I6381 Other cerebral infarction due to occlusion or stenosis of small artery: Secondary | ICD-10-CM | POA: Diagnosis not present

## 2022-10-28 NOTE — Therapy (Signed)
OUTPATIENT OCCUPATIONAL THERAPY NEURO TREATMENT  Patient Name: Susan Farrell MRN: [id number] DOB:Jan 03, 1946, 77 y.o., female Today's Date: 10/28/2022  PCP: Dr. Marcello Fennel REFERRING PROVIDER: Mariam Dollar, [place]  END OF SESSION:  OT End of Session - 10/28/22 0901     Visit Number 8    Number of Visits 24    Date for OT Re-Evaluation 12/24/22    OT Start Time 0845    OT Stop Time 0930    OT Time Calculation (min) 45 min    Activity Tolerance Patient tolerated treatment well;No increased pain    Behavior During Therapy [facility] for tasks assessed/performed             Past Medical History:  Diagnosis Date   Hypertension    Stroke [facility])    Past Surgical History:  Procedure Laterality Date   ABDOMINAL HYSTERECTOMY     CHOLECYSTECTOMY     ENDOVASCULAR REPAIR/STENT GRAFT N/A 06/12/2020   Procedure: ENDOVASCULAR REPAIR/STENT GRAFT;  Surgeon: Renford Dills, MD;  Location: [facility] INVASIVE CV LAB;  Service: Cardiovascular;  Laterality: N/A;   Patient Active Problem List   Diagnosis Date Noted   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) 09/12/2022   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) 09/09/2022   History of abdominal aortic aneurysm (AAA) repair 09/09/2022   HTN (hypertension) 09/09/2022   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   Anemia of chronic disease 09/09/2022   Tobacco abuse 09/09/2022   Thyroid nodule 09/09/2022   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) 09/02/2021   Hypertensive urgency 08/29/2021   Dyslipidemia 08/29/2021   Hypothyroidism 08/29/2021   Cerebellar stroke, acute (HCC) 08/28/2021   Ruptured abdominal aortic aneurysm (AAA) (HCC) 06/12/2020   Goiter 09/13/2019   Osteopenia 09/13/2019    ONSET DATE: 09/08/2022  REFERRING DIAG: Cerebellar CVA  THERAPY DIAG:  Muscle weakness (generalized)  Other lack of coordination  Rationale for Evaluation and Treatment:  Rehabilitation  SUBJECTIVE:   SUBJECTIVE STATEMENT:  Pt. reports  doing well today, and having had a nice weekend.  Pt accompanied by: self  PERTINENT HISTORY:   Pt. is a 77 y.o. female who had a Cerbellar CVA, Occlusion of the right cerebellar artery. 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 Pt. 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 No  FALLS: Has patient fallen in last 6 months? No  LIVING ENVIRONMENT: Lives with: lives with their spouse Lives in: House/apartment, one story home Stairs: Yes: External: 1 steps; none Has following equipment at home: Counselling psychologist, Environmental consultant - 2 wheeled, Environmental consultant - 4 wheeled, and bed side commode  PLOF: Independent, driving  PATIENT GOALS: To have her right arm be more steady  OBJECTIVE:   HAND DOMINANCE: Right  ADLs: Transfers/ambulation related to ADLs: Eating: independent with the left hand, has difficulty using dominant right hand 2/2 incoordination Grooming: Independent UB Dressing: Independent, increased time LB Dressing: Independent, increased time Toileting: Independent Bathing: Supervision, uses shower seat Tub Shower transfers: Supervision   IADLs: Shopping: Family-moved back home yesterday 2/2 Husband having COVID when Pt. D/d's from inpatient rehab Light housekeeping: Independent Meal Prep: Has not had a chance to perform meal prep yet. Community mobility: Relies  on family Medication management: independent Financial management: Husband assist 2/2  difficulty with writing checks. Handwriting: 25% legible  MOBILITY STATUS: Needs Assist: uses a RW  ACTIVITY TOLERANCE: Activity tolerance: Fatigues after greater than 30 min. Of activity  FUNCTIONAL OUTCOME MEASURES:  FOTO: 67, TR score: 79  MAM-20: Sum score:  69/80:  Items scored 2 indicated as very hard to do:  Cutting nails, cutting meat, writing 3-4 sentences legibly, picking up a 1/2 full water pitcher  Items scored: as 3 indicated as a little hard to do:  Opening a wide mouth jar, using a fork/spoon, dialing/keying in phone numbers  UPPER EXTREMITY ROM:    Active ROM Right Eval WNL Left Eval WNL  Shoulder flexion    Shoulder abduction    Shoulder adduction    Shoulder extension    Shoulder internal rotation    Shoulder external rotation    Elbow flexion    Elbow extension    Wrist flexion    Wrist extension    Wrist ulnar deviation    Wrist radial deviation    Wrist pronation    Wrist supination    (Blank rows = not tested)  UPPER EXTREMITY MMT:     MMT Right eval Left eval  Shoulder flexion 5/5 5/5  Shoulder abduction 5/5 5/5  Shoulder adduction    Shoulder extension    Shoulder internal rotation    Shoulder external rotation    Middle trapezius    Lower trapezius    Elbow flexion 5/5 5/5  Elbow extension 5/5 5/5  Wrist flexion    Wrist extension 5/5 5/5  Wrist ulnar deviation    Wrist radial deviation    Wrist pronation    Wrist supination    (Blank rows = not tested)  HAND FUNCTION: Grip strength: Right: 43 lbs; Left: 45 lbs, Lateral pinch: Right: 15 lbs, Left: 11 lbs, and 3 point pinch: Right: 9 lbs, Left: 8 lbs  COORDINATION: 9 Hole Peg test: Right: 54 sec. With multiple pegs dropped. Left: 32 sec  SENSATION: Light touch: WFL Proprioception: WFL  EDEMA: N/A  MUSCLE TONE: Intact  COGNITION: Overall cognitive status: Within functional limits for tasks assessed  VISION: Subjective report:  No vision changes Baseline vision: Wears glasses for reading only   PERCEPTION: WFL  PRAXIS: WFL  TODAY'S TREATMENT:                                                                                                                              DATE: 10/28/2022  Therapeutic Ex.:  Pt. performed  right gross gripping with a gross grip strengthener. Pt. worked on sustaining grip while grasping pegs and reaching at various heights. The gripper was set to 28.9# of grip strength resistance for 2 trials.  Neuromuscular re-education:  Pt. performed right hand [facility] tasks using the Grooved pegboard. Pt. worked on grasping the grooved pegs from a horizontal position, and moving the pegs to a vertical position in the hand to prepare for placing them in the grooved slot. Pt. worked on removing the pegs, and  storing them into the palm of her right hand, then controlled dropping them one at a time from the ulnar aspect of the palm. Followed by the Pt. removing pegs one at a time using thumb opposition to the tip of the 2nd through 5th digits.    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 Pt. worked on Data processing manager copying a simple recipe onto a recipe card with small width lines with 50% legibility.  PATIENT EDUCATION: Education details:  [facility] skills Person educated: Patient Education method: Medical illustrator Education comprehension: verbalized understanding, returned demonstration, and needs further education  HOME EXERCISE PROGRAM:  Right hand strengthening with green theraputty, handwriting  GOALS: Goals reviewed with patient? Yes  SHORT TERM GOALS: Target date: 11/12/2022     Pt. Will be independent with HEPs for the RUE hand function Baseline: EVal: No current HEPs Goal status: INITIAL   LONG TERM GOALS: Target date: 12/24/2022    Pt. Will improve MAM-20 sum score by 5 points for improved hand function Baseline: Eval: MAM-20 Sum score: 69/80 Goal status: INITIAL  2.  Pt. will increase FOTO score by 2 points to reflect Pt. perceived improvement with assessment specific ADL/IADL's.  Baseline: Eval: FOTO score: 67 Goal status: INITIAL  3.  Pt. Will be able to write 1   sentence with 75% legibility  in preparation for written correspondence Baseline: Eval: Name only 25% legibility Goal status: INITIAL  4.  Pt. Will improve right grip strength by 5# to be able to securely hold items without dropping them Baseline: Eval: Right: 43#, Left: 45# Goal status: INITIAL  5.  Pt. Will improve right hand [facility] skills by 3 sec. On the 9-hole peg test to be able to manipulate small objects Baseline: Eval: Right: 54 sec. With dropping multiple pegs, L: 32sec. Goal status: INITIAL  6.  Pt. Will be independently, and efficiently using her right hand to use utensils to cut, and eat her food Baseline:  Eval: Pt. Presents with difficulty using her right hand to use utensils. Goal status: INITIAL  ASSESSMENT:  CLINICAL IMPRESSION:  Pt. is making progress with right hand function, and reports that she has noticed that her handwriting has improved.  Pt. was able to copy a simple recipe on recipe cards with 50% legibility. Pt. continues to drop several small pegs from her  right hand during translatory movements.  Pt. continues to benefit from OT services for ADL training, A/E training, there. Ex., neuromuscular re-education, and Pt. education in order to improve RUE functioning, and increase engagement in ADL/IADL tasks, and maximize overall independence.   PERFORMANCE DEFICITS: in functional skills including ADLs, IADLs, coordination, dexterity, and strength.   IMPAIRMENTS: are limiting patient from ADLs, IADLs, leisure, and social participation.   CO-MORBIDITIES: may have co-morbidities  that affects occupational performance. Patient will benefit from skilled OT to address above impairments and improve overall function.  MODIFICATION OR ASSISTANCE TO COMPLETE EVALUATION: Min-Moderate modification of tasks or assist with assess necessary to complete an evaluation.  OT OCCUPATIONAL PROFILE AND HISTORY: Detailed assessment: Review of records and additional review of physical,  cognitive, psychosocial history related to current functional performance.  CLINICAL DECISION MAKING: Moderate - several treatment options, min-mod task modification necessary  REHAB POTENTIAL: Good  EVALUATION COMPLEXITY: Moderate    PLAN:  OT FREQUENCY: 2x/week  OT DURATION: 12 weeks  PLANNED INTERVENTIONS: self care/ADL training, therapeutic exercise, therapeutic activity,  neuromuscular re-education, manual therapy, functional mobility training, paraffin, moist heat, contrast bath, and patient/family education  RECOMMENDED OTHER SERVICES: PT  CONSULTED AND AGREED WITH PLAN OF CARE: Patient  PLAN FOR NEXT SESSION: see above  Olegario Messier, MS, OTR/L   10/28/2022, 9:34 AM

## 2022-10-28 NOTE — Therapy (Signed)
OUTPATIENT PHYSICAL THERAPY TREATMENT    Patient Name: Susan Farrell MRN: [id number] DOB:July 01, 1945, 77 y.o., female Today's Date: 10/28/2022   PCP: Barbette Reichmann, MD REFERRING PROVIDER: Charlton Amor, PA-C Purpose for referral, evaluation, treatment: Rehabilitation.   PT End of Session - 10/28/22 0906     Visit Number 8    Number of Visits 16    Date for PT Re-Evaluation 11/26/22    Authorization Type [employer]    Authorization Time Period 10/01/22-11/26/22    Progress Note Due on Visit 10    PT Start Time 0930    PT Stop Time 1010    PT Time Calculation (min) 40 min    Equipment Utilized During Treatment Gait belt    Activity Tolerance Patient tolerated treatment well;No increased pain    Behavior During Therapy [facility] for tasks assessed/performed                    Past Medical History:  Diagnosis Date   Hypertension    Stroke [facility])    Past Surgical History:  Procedure Laterality Date   ABDOMINAL HYSTERECTOMY     CHOLECYSTECTOMY     ENDOVASCULAR REPAIR/STENT GRAFT N/A 06/12/2020   Procedure: ENDOVASCULAR REPAIR/STENT GRAFT;  Surgeon: Renford Dills, MD;  Location: [facility] INVASIVE CV LAB;  Service: Cardiovascular;  Laterality: N/A;     ONSET DATE: 08/28/21  REFERRING DIAG: I63.81 (ICD-10-CM) - 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)  THERAPY DIAG:  Abnormality of gait and mobility  Other lack of coordination  Muscle weakness (generalized)  Difficulty in walking, not elsewhere classified  Other abnormalities of gait and mobility  Rationale for Evaluation and Treatment Rehabilitation  SUBJECTIVE:                                                                                                                                                                                             SUBJECTIVE STATEMENT: Patient reports no complaints today states that she had a good weekend. No pain   PERTINENT HISTORY:    Susan Farrell sustained a cerebelar CVA in August 2024, rapid progression in symptom improved over first week, DC acute to CIR, then home ot son's house for 5 days and finally back ot her house 2 days prior to this eval. Pt has been using RW at home since DC, no rollator use yet.   PAIN:  Are you having pain? No  PRECAUTIONS: Fall  WEIGHT BEARING RESTRICTIONS No  FALLS: Has patient fallen in last 6 months? No   LIVING ENVIRONMENT: Lives with: lives with their family and lives with their partner Lives  in: House/apartment Stairs: Yes: External: 1 steps; none Has following equipment at home: Walker - 2 wheeled, Walker - 4 wheeled, bed side commode, and only uses bed side commode at night   PLOF: Independent, Independent with basic ADLs, Independent with household mobility without device, Independent with community mobility without device, Independent with gait, and Leisure: going to church, going to grocery and department stores.   PATIENT GOALS Get back to normal walking and balance without any support.   OBJECTIVE:   FUNCTIONAL TESTs:  5 times sit to stand: 19 sec hands on knees   Last years 6 minute walk test: 234 feet and requires seated rest break due to lower extremity fatigue on the left lower extremity  Last years Berg Balance Scale: 36  PATIENT SURVEYS:  FOTO 42  TODAY'S TREATMENT:  Gait with Rollator 3x 169ft with supervision assist. Noted foot drag on BLE LLE>RLE on first bout, but mildly improved on each bout. Pt states that she drags her foot more when she had been sitting for long periods of time, and she was sitting through all of OT treatment prior to PT.   Therex: Seated hip march AROM each LE x 10 reps; significant reduced ROM on the LLE.  Seated hip abduction/adduction x 12 bil over cane in floor, AAROM required on the RLE for adduction  Seated knee ext (AROM) each LE x 12 reps Seated Hip add ball squeeze hold 3 sec x 12 reps  Seated HS curl with pillow on foot to  reduce friction from show, AAROM on the LLE with increased assist  Sit to stand x 10 reps with good eccentric control (CGA only)   Standing step over SPC in floor x 10 bil with CGA to force improved knee flexion and reduce hip compensation  Lateral step over SPC in floor x 10 bil with moderate cues for HS activation to return to midline    PATIENT EDUCATION: Education details: POC Person educated: Patient Education method: Explanation Education comprehension: verbalized understanding   HOME EXERCISE PROGRAM:  Access Code: [email or web address] URL: [email or web address] Date: 10/16/2022 Prepared by: Maureen Ralphs  Exercises - Supine Active Straight Leg Raise  - 1 x daily - 3 x weekly - 3 sets - 10 reps - Supine Hip Abduction AROM  - 1 x daily - 3 x weekly - 3 sets - 10 reps - Sidelying Hip Abduction  - 1 x daily - 3 x weekly - 3 sets - 10 reps - Sidelying Hip Abduction  - 1 x daily - 3 x weekly - 3 sets - 10 reps - Sidelying Hip Extension in Abduction  - 1 x daily - 3 x weekly - 3 sets - 10 reps - Prone Hip Extension  - 1 x daily - 3 x weekly - 3 sets - 10 reps    GOALS:  Goals reviewed with patient? Yes  SHORT TERM GOALS: Target date: 10/29/22  Patient will be independent in updated home exercise program to improve strength/mobility for better functional independence with ADLs. Baseline:  eval: still working on CIR HEP  Goal status: INITIAL  LONG TERM GOALS: Target date: 11/26/22 Patient will improve FOTO score by >14 to indicate subjective reduction in difficulty performing ADL/IADL tasks/mobility.  Baseline: eval: 42 Goal status: INITIAL   2.  Patient will demonstrate 5xSTS improvement in time and use of UE support to indicate improved power, strength, motor control, and reduced pain/dizziness.  Baseline eval: 19sec Goal status: INITIAL   3.   The pt  will improve to >0.62m/s to improve ability to safely participate in typical work and/or leisure   Baseline: eval: 24.83sec c RW  Goal status: INITIAL   4.  Patient will improve score objective balance assessment by > 6 points in BERG test to indicate reduced falls risk and improved safety at home.   Baseline: 9/17: 25/ 56 Goal status: INITIAL    ASSESSMENT: Patient presented with good motivation for all of today's activities. PT treatment focused on BLE strength and functional movement training. Noted to have increased foot drag on the LLE on this day compared to prior session, but mild improved step height with functional gait training.  Patient would benefit from skilled physical therapy to increase stability and mobility for improved quality of life.    OBJECTIVE IMPAIRMENTS Abnormal gait, decreased activity tolerance, decreased balance, decreased coordination, decreased endurance, decreased mobility, difficulty walking, and decreased strength.   ACTIVITY LIMITATIONS lifting, bending, standing, squatting, stairs, dressing, and caring for others  PARTICIPATION LIMITATIONS: meal prep, cleaning, driving, shopping, yard work, and church  PERSONAL FACTORS Age are also affecting patient's functional outcome.   REHAB POTENTIAL: Good  CLINICAL DECISION MAKING: Evolving/moderate complexity  EVALUATION COMPLEXITY: Moderate  PLAN: PT FREQUENCY: 2x/week  PT DURATION: 8 weeks  PLANNED INTERVENTIONS: Therapeutic exercises, Therapeutic activity, Neuromuscular re-education, Balance training, Gait training, Patient/Family education, Self Care, Joint mobilization, Dry Needling, and Manual therapy  PLAN FOR NEXT SESSION:    Continue with progressive LE strengthening, gait/balance training.     9:06 AM, 10/28/22 Golden Pop PT  Physical Therapist - [facility]  Outpatient Physical Therapy- Main Campus [phone]

## 2022-10-30 ENCOUNTER — Inpatient Hospital Stay: Payer: Medicare HMO | Attending: Nurse Practitioner

## 2022-10-30 DIAGNOSIS — D472 Monoclonal gammopathy: Secondary | ICD-10-CM | POA: Diagnosis not present

## 2022-10-30 DIAGNOSIS — D649 Anemia, unspecified: Secondary | ICD-10-CM | POA: Insufficient documentation

## 2022-10-30 DIAGNOSIS — D573 Sickle-cell trait: Secondary | ICD-10-CM | POA: Diagnosis not present

## 2022-10-30 LAB — CBC WITH DIFFERENTIAL (CANCER CENTER ONLY)
Abs Immature Granulocytes: 0.01 10*3/uL (ref 0.00–0.07)
Basophils Absolute: 0 10*3/uL (ref 0.0–0.1)
Basophils Relative: 1 %
Eosinophils Absolute: 0.1 10*3/uL (ref 0.0–0.5)
Eosinophils Relative: 1 %
HCT: 32.1 % — ABNORMAL LOW (ref 36.0–46.0)
Hemoglobin: 10.1 g/dL — ABNORMAL LOW (ref 12.0–15.0)
Immature Granulocytes: 0 %
Lymphocytes Relative: 28 %
Lymphs Abs: 1.1 10*3/uL (ref 0.7–4.0)
MCH: 23.8 pg — ABNORMAL LOW (ref 26.0–34.0)
MCHC: 31.5 g/dL (ref 30.0–36.0)
MCV: 75.5 fL — ABNORMAL LOW (ref 80.0–100.0)
Monocytes Absolute: 0.2 10*3/uL (ref 0.1–1.0)
Monocytes Relative: 5 %
Neutro Abs: 2.5 10*3/uL (ref 1.7–7.7)
Neutrophils Relative %: 65 %
Platelet Count: 171 10*3/uL (ref 150–400)
RBC: 4.25 MIL/uL (ref 3.87–5.11)
RDW: 17.6 % — ABNORMAL HIGH (ref 11.5–15.5)
WBC Count: 3.9 10*3/uL — ABNORMAL LOW (ref 4.0–10.5)
nRBC: 0 % (ref 0.0–0.2)

## 2022-10-30 LAB — CMP (CANCER CENTER ONLY)
ALT: 13 U/L (ref 0–44)
AST: 23 U/L (ref 15–41)
Albumin: 3.8 g/dL (ref 3.5–5.0)
Alkaline Phosphatase: 61 U/L (ref 38–126)
Anion gap: 8 (ref 5–15)
BUN: 13 mg/dL (ref 8–23)
CO2: 25 mmol/L (ref 22–32)
Calcium: 9.5 mg/dL (ref 8.9–10.3)
Chloride: 106 mmol/L (ref 98–111)
Creatinine: 0.81 mg/dL (ref 0.44–1.00)
GFR, Estimated: >60 mL/min (ref >60)
Glucose, Bld: 108 mg/dL — ABNORMAL HIGH (ref 70–99)
Potassium: 3.8 mmol/L (ref 3.5–5.1)
Sodium: 139 mmol/L (ref 135–145)
Total Bilirubin: 0.3 mg/dL (ref 0.3–1.2)
Total Protein: 7.9 g/dL (ref 6.5–8.1)

## 2022-10-31 ENCOUNTER — Ambulatory Visit: Payer: Medicare HMO

## 2022-10-31 ENCOUNTER — Ambulatory Visit: Payer: Medicare HMO | Admitting: Physical Therapy

## 2022-10-31 DIAGNOSIS — M6281 Muscle weakness (generalized): Secondary | ICD-10-CM

## 2022-10-31 DIAGNOSIS — I63549 Cerebral infarction due to unspecified occlusion or stenosis of unspecified cerebellar artery: Secondary | ICD-10-CM | POA: Diagnosis not present

## 2022-10-31 DIAGNOSIS — R2689 Other abnormalities of gait and mobility: Secondary | ICD-10-CM | POA: Diagnosis not present

## 2022-10-31 DIAGNOSIS — R269 Unspecified abnormalities of gait and mobility: Secondary | ICD-10-CM | POA: Diagnosis not present

## 2022-10-31 DIAGNOSIS — R262 Difficulty in walking, not elsewhere classified: Secondary | ICD-10-CM | POA: Diagnosis not present

## 2022-10-31 DIAGNOSIS — R278 Other lack of coordination: Secondary | ICD-10-CM

## 2022-10-31 DIAGNOSIS — I639 Cerebral infarction, unspecified: Secondary | ICD-10-CM | POA: Diagnosis not present

## 2022-10-31 DIAGNOSIS — I6381 Other cerebral infarction due to occlusion or stenosis of small artery: Secondary | ICD-10-CM | POA: Diagnosis not present

## 2022-10-31 NOTE — Therapy (Signed)
OUTPATIENT OCCUPATIONAL THERAPY NEURO TREATMENT  Patient Name: Susan Farrell MRN: [id number] DOB:Sep 27, 1945, 77 y.o., female Today's Date: 10/31/2022  PCP: Dr. Marcello Fennel REFERRING PROVIDER: Mariam Dollar, [place]  END OF SESSION:  OT End of Session - 10/31/22 1059     Visit Number 9    Number of Visits 24    Date for OT Re-Evaluation 12/24/22    Progress Note Due on Visit 10    OT Start Time 0930    OT Stop Time 1015    OT Time Calculation (min) 45 min    Activity Tolerance Patient tolerated treatment well    Behavior During Therapy [facility] for tasks assessed/performed            Past Medical History:  Diagnosis Date   Hypertension    Stroke [facility])    Past Surgical History:  Procedure Laterality Date   ABDOMINAL HYSTERECTOMY     CHOLECYSTECTOMY     ENDOVASCULAR REPAIR/STENT GRAFT N/A 06/12/2020   Procedure: ENDOVASCULAR REPAIR/STENT GRAFT;  Surgeon: Renford Dills, MD;  Location: [facility] INVASIVE CV LAB;  Service: Cardiovascular;  Laterality: N/A;   Patient Active Problem List   Diagnosis Date Noted   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) 09/12/2022   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) 09/09/2022   History of abdominal aortic aneurysm (AAA) repair 09/09/2022   HTN (hypertension) 09/09/2022   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   Anemia of chronic disease 09/09/2022   Tobacco abuse 09/09/2022   Thyroid nodule 09/09/2022   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) 09/02/2021   Hypertensive urgency 08/29/2021   Dyslipidemia 08/29/2021   Hypothyroidism 08/29/2021   Cerebellar stroke, acute (HCC) 08/28/2021   Ruptured abdominal aortic aneurysm (AAA) (HCC) 06/12/2020   Goiter 09/13/2019   Osteopenia 09/13/2019   ONSET DATE: 09/08/2022  REFERRING DIAG: Cerebellar CVA  THERAPY DIAG:  Muscle weakness (generalized)  Other lack of coordination  Rationale for Evaluation and  Treatment: Rehabilitation  SUBJECTIVE:   SUBJECTIVE STATEMENT: Pt verbalizes wanting to continue to work on increasing control with her [facility] skills. Pt accompanied by: self  PERTINENT HISTORY:   Pt. is a 77 y.o. female who had a Cerbellar CVA, Occlusion of the right cerebellar artery. 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 Pt. 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 No  FALLS: Has patient fallen in last 6 months? No  LIVING ENVIRONMENT: Lives with: lives with their spouse Lives in: House/apartment, one story home Stairs: Yes: External: 1 steps; none Has following equipment at home: Counselling psychologist, Environmental consultant - 2 wheeled, Environmental consultant - 4 wheeled, and bed side commode  PLOF: Independent, driving  PATIENT GOALS: To have her right arm be more steady  OBJECTIVE:   HAND DOMINANCE: Right  ADLs: Transfers/ambulation related to ADLs: Eating: independent with the left hand, has difficulty using dominant right hand 2/2 incoordination Grooming: Independent UB Dressing: Independent, increased time LB Dressing: Independent, increased time Toileting: Independent Bathing: Supervision, uses shower seat Tub Shower transfers: Supervision  IADLs: Shopping: Family-moved back home yesterday 2/2 Husband having COVID when Pt. D/d's from inpatient rehab Light housekeeping: Independent Meal Prep: Has not had a chance to perform meal prep yet. Community mobility: Relies  on family Medication management: independent Financial management: Husband assist 2/2  difficulty with writing  checks. Handwriting: 25% legible  MOBILITY STATUS: Needs Assist: uses a RW  ACTIVITY TOLERANCE: Activity tolerance: Fatigues after greater than 30 min. Of activity  FUNCTIONAL OUTCOME MEASURES:  FOTO: 67, TR score:  79  MAM-20: Sum score: 69/80:  Items scored 2 indicated as very hard to do:  Cutting nails, cutting meat, writing 3-4 sentences legibly, picking up a 1/2 full water pitcher  Items scored: as 3 indicated as a little hard to do:  Opening a wide mouth jar, using a fork/spoon, dialing/keying in phone numbers  UPPER EXTREMITY ROM:    Active ROM Right Eval WNL Left Eval WNL  Shoulder flexion    Shoulder abduction    Shoulder adduction    Shoulder extension    Shoulder internal rotation    Shoulder external rotation    Elbow flexion    Elbow extension    Wrist flexion    Wrist extension    Wrist ulnar deviation    Wrist radial deviation    Wrist pronation    Wrist supination    (Blank rows = not tested)  UPPER EXTREMITY MMT:     MMT Right eval Left eval  Shoulder flexion 5/5 5/5  Shoulder abduction 5/5 5/5  Shoulder adduction    Shoulder extension    Shoulder internal rotation    Shoulder external rotation    Middle trapezius    Lower trapezius    Elbow flexion 5/5 5/5  Elbow extension 5/5 5/5  Wrist flexion    Wrist extension 5/5 5/5  Wrist ulnar deviation    Wrist radial deviation    Wrist pronation    Wrist supination    (Blank rows = not tested)  HAND FUNCTION: Grip strength: Right: 43 lbs; Left: 45 lbs, Lateral pinch: Right: 15 lbs, Left: 11 lbs, and 3 point pinch: Right: 9 lbs, Left: 8 lbs  COORDINATION: 9 Hole Peg test: Right: 54 sec. With multiple pegs dropped. Left: 32 sec  SENSATION: Light touch: WFL Proprioception: WFL  EDEMA: N/A  MUSCLE TONE: Intact  COGNITION: Overall cognitive status: Within functional limits for tasks assessed  VISION: Subjective report:  No vision changes Baseline vision: Wears glasses for reading only  PERCEPTION: WFL  PRAXIS: WFL  TODAY'S TREATMENT:                                                                                                                              DATE: 10/31/2022 Neuro  re-ed: -Faciltiated R FMC/GMC skills working to place [employer] into board, which was elevated on an incline wedge at table top level.  Pt practiced FMC/dexterity skills working to rotate pegs between dot side up/down, with vc and demo to reposition pegs within fingertips while avoiding stabilizing peg on pegboard while repositioning.  Practiced storage of 2 pegs in hand at 1 time while placing into pegboard, then storing up to 4 in hand when removing pegs from pegboard.  Therapeutic Activity: Facilitated R FMC/dexterity  skills working to [employer], chips, and a string of marbles in hand, moving them from palm to fingertips, repositioning above noted items within fingertips, and opposing items between fingertips.    Self Care: Pt worked on Information systems manager working to trace wide and narrow lines, drawn vertically, horizontally, and diagonally across the page.  Pt worked on Data processing manager copying a list of names/cars onto small width lines on paper.   PATIENT EDUCATION: Education details:  [facility] skills/writing skills Person educated: Patient Education method: Medical illustrator Education comprehension: verbalized understanding, returned demonstration, and needs further education  HOME EXERCISE PROGRAM:  Right hand strengthening with green theraputty, handwriting  GOALS: Goals reviewed with patient? Yes  SHORT TERM GOALS: Target date: 11/12/2022     Pt. Will be independent with HEPs for the RUE hand function Baseline: EVal: No current HEPs Goal status: INITIAL   LONG TERM GOALS: Target date: 12/24/2022    Pt. Will improve MAM-20 sum score by 5 points for improved hand function Baseline: Eval: MAM-20 Sum score: 69/80 Goal status: INITIAL  2.  Pt. will increase FOTO score by 2 points to reflect Pt. perceived improvement with assessment specific ADL/IADL's.  Baseline: Eval: FOTO score: 67 Goal status: INITIAL  3.  Pt. Will be able to write 1   sentence with 75% legibility  in preparation for written correspondence Baseline: Eval: Name only 25% legibility Goal status: INITIAL  4.  Pt. Will improve right grip strength by 5# to be able to securely hold items without dropping them Baseline: Eval: Right: 43#, Left: 45# Goal status: INITIAL  5.  Pt. Will improve right hand [facility] skills by 3 sec. On the 9-hole peg test to be able to manipulate small objects Baseline: Eval: Right: 54 sec. With dropping multiple pegs, L: 32sec. Goal status: INITIAL  6.  Pt. Will be independently, and efficiently using her right hand to use utensils to cut, and eat her food Baseline:  Eval: Pt. Presents with difficulty using her right hand to use utensils. Goal status: INITIAL  ASSESSMENT:  CLINICAL IMPRESSION: Pt continues to focus on R hand FMC/dexterity skills.  Noted frequent dropping of Judy Board pegs when challenged to work with 2 pegs in hand simultaneously, working to store 1 in palm while repositioning the other within fingertips.  Pt. is making progress with right hand function, and reports that she has noticed that her handwriting has improved.  Noted decreased accuracy when tracing diagonal lines on paper.  OT advised pt on finding words, logos, headings to trace from a magazine to further develop pre-writing skills for improving accuracy with written lines on paper.  Pt. continues to benefit from OT services for ADL training, A/E training, there. Ex., neuromuscular re-education, and Pt. education in order to improve RUE functioning, and increase engagement in ADL/IADL tasks, and maximize overall independence.   PERFORMANCE DEFICITS: in functional skills including ADLs, IADLs, coordination, dexterity, and strength.   IMPAIRMENTS: are limiting patient from ADLs, IADLs, leisure, and social participation.   CO-MORBIDITIES: may have co-morbidities  that affects occupational performance. Patient will benefit from skilled OT to address above  impairments and improve overall function.  MODIFICATION OR ASSISTANCE TO COMPLETE EVALUATION: Min-Moderate modification of tasks or assist with assess necessary to complete an evaluation.  OT OCCUPATIONAL PROFILE AND HISTORY: Detailed assessment: Review of records and additional review of physical, cognitive, psychosocial history related to current functional performance.  CLINICAL DECISION MAKING: Moderate - several treatment options, min-mod task modification necessary  REHAB POTENTIAL: Good  EVALUATION COMPLEXITY: Moderate    PLAN:  OT FREQUENCY: 2x/week  OT DURATION: 12 weeks  PLANNED INTERVENTIONS: self care/ADL training, therapeutic exercise, therapeutic activity, neuromuscular re-education, manual therapy, functional mobility training, paraffin, moist heat, contrast bath, and patient/family education  RECOMMENDED OTHER SERVICES: PT  CONSULTED AND AGREED WITH PLAN OF CARE: Patient  PLAN FOR NEXT SESSION: see above  Danelle Earthly, MS, OTR/L   10/31/2022, 11:01 AM

## 2022-10-31 NOTE — Therapy (Signed)
OUTPATIENT PHYSICAL THERAPY TREATMENT    Patient Name: Susan Farrell MRN: [id number] DOB:Oct 09, 1945, 77 y.o., female Today's Date: 10/31/2022   PCP: Barbette Reichmann, MD REFERRING PROVIDER: Charlton Amor, PA-C Purpose for referral, evaluation, treatment: Rehabilitation.   PT End of Session - 10/31/22 0847     Visit Number 9    Number of Visits 16    Date for PT Re-Evaluation 11/26/22    Authorization Type [employer]    Authorization Time Period 10/01/22-11/26/22    Progress Note Due on Visit 10    PT Start Time 0848    PT Stop Time 0929    PT Time Calculation (min) 41 min    Equipment Utilized During Treatment Gait belt    Activity Tolerance Patient tolerated treatment well;No increased pain    Behavior During Therapy [facility] for tasks assessed/performed                     Past Medical History:  Diagnosis Date   Hypertension    Stroke [facility])    Past Surgical History:  Procedure Laterality Date   ABDOMINAL HYSTERECTOMY     CHOLECYSTECTOMY     ENDOVASCULAR REPAIR/STENT GRAFT N/A 06/12/2020   Procedure: ENDOVASCULAR REPAIR/STENT GRAFT;  Surgeon: Renford Dills, MD;  Location: [facility] INVASIVE CV LAB;  Service: Cardiovascular;  Laterality: N/A;     ONSET DATE: 08/28/21  REFERRING DIAG: I63.81 (ICD-10-CM) - 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)  THERAPY DIAG:  Abnormality of gait and mobility  Other lack of coordination  Muscle weakness (generalized)  Difficulty in walking, not elsewhere classified  Rationale for Evaluation and Treatment Rehabilitation  SUBJECTIVE:                                                                                                                                                                                             SUBJECTIVE STATEMENT: Patient reports no complaints today states that she had a good weekend. No pain   PERTINENT HISTORY:   Razan sustained a cerebelar CVA in August  2024, rapid progression in symptom improved over first week, DC acute to CIR, then home ot son's house for 5 days and finally back ot her house 2 days prior to this eval. Pt has been using RW at home since DC, no rollator use yet.   PAIN:  Are you having pain? No  PRECAUTIONS: Fall  WEIGHT BEARING RESTRICTIONS No  FALLS: Has patient fallen in last 6 months? No   LIVING ENVIRONMENT: Lives with: lives with their family and lives with their partner Lives in: House/apartment Stairs: Yes: External: 1  steps; none Has following equipment at home: Dan Humphreys - 2 wheeled, Walker - 4 wheeled, bed side commode, and only uses bed side commode at night   PLOF: Independent, Independent with basic ADLs, Independent with household mobility without device, Independent with community mobility without device, Independent with gait, and Leisure: going to church, going to grocery and department stores.   PATIENT GOALS Get back to normal walking and balance without any support.   OBJECTIVE:   FUNCTIONAL TESTs:  5 times sit to stand: 19 sec hands on knees   Last years 6 minute walk test: 234 feet and requires seated rest break due to lower extremity fatigue on the left lower extremity  Last years Berg Balance Scale: 36  PATIENT SURVEYS:  FOTO 42  TODAY'S TREATMENT: TE Gait with RW  3x 117ft with supervision assist. 2.5# AW donned to LLE to improve proprioception and for increased strength   The following activities were completed in parallel bars or at balance bar   Standing step over 1/2 bolster x 10 reps, compensates for decreased knee flexion with heel raise on the R to improve foot clearance Seated HS curls with rtb 2 x 10 reps, min motion, challenged with 5 sec hold on second round  Standing march 2 x 10 LLE with 2.5# AW  Standing hip abduction 2 x 10 ( LLE)  Seated hip abduction/adduction x 10 bil over cane in floor, AAROM required on the RLE for adduction  Seated HS curl with bolster and PT  assist ( rolling bolster on floor) x 10 reps, to at least 90 degrees knee flexion.   Ambulated with RW to OT ( 80 ft approx)   Unless otherwise stated, SBA was provided and gait belt donned in order to ensure pt safety    PATIENT EDUCATION: Education details: POC Person educated: Patient Education method: Explanation Education comprehension: verbalized understanding   HOME EXERCISE PROGRAM:  Access Code: [email or web address] URL: [email or web address] Date: 10/16/2022 Prepared by: Maureen Ralphs  Exercises - Supine Active Straight Leg Raise  - 1 x daily - 3 x weekly - 3 sets - 10 reps - Supine Hip Abduction AROM  - 1 x daily - 3 x weekly - 3 sets - 10 reps - Sidelying Hip Abduction  - 1 x daily - 3 x weekly - 3 sets - 10 reps - Sidelying Hip Abduction  - 1 x daily - 3 x weekly - 3 sets - 10 reps - Sidelying Hip Extension in Abduction  - 1 x daily - 3 x weekly - 3 sets - 10 reps - Prone Hip Extension  - 1 x daily - 3 x weekly - 3 sets - 10 reps    GOALS:  Goals reviewed with patient? Yes  SHORT TERM GOALS: Target date: 10/29/22  Patient will be independent in updated home exercise program to improve strength/mobility for better functional independence with ADLs. Baseline:  eval: still working on CIR HEP  Goal status: INITIAL  LONG TERM GOALS: Target date: 11/26/22 Patient will improve FOTO score by >14 to indicate subjective reduction in difficulty performing ADL/IADL tasks/mobility.  Baseline: eval: 42 Goal status: INITIAL   2.  Patient will demonstrate 5xSTS improvement in time and use of UE support to indicate improved power, strength, motor control, and reduced pain/dizziness.  Baseline eval: 19sec Goal status: INITIAL   3.   The pt will improve to >0.30m/s to improve ability to safely participate in typical work and/or leisure  Baseline: eval: 24.83sec c RW  Goal status: INITIAL   4.  Patient will improve score objective balance assessment by > 6  points in BERG test to indicate reduced falls risk and improved safety at home.   Baseline: 9/17: 25/ 56 Goal status: INITIAL    ASSESSMENT: Patient presented with good motivation for all of today's activities. PT treatment focused on BLE strength and functional movement training. Focussed on improving LLE hip and knee muscle activation this date.  Patient would benefit from skilled physical therapy to increase stability and mobility for improved quality of life.    OBJECTIVE IMPAIRMENTS Abnormal gait, decreased activity tolerance, decreased balance, decreased coordination, decreased endurance, decreased mobility, difficulty walking, and decreased strength.   ACTIVITY LIMITATIONS lifting, bending, standing, squatting, stairs, dressing, and caring for others  PARTICIPATION LIMITATIONS: meal prep, cleaning, driving, shopping, yard work, and church  PERSONAL FACTORS Age are also affecting patient's functional outcome.   REHAB POTENTIAL: Good  CLINICAL DECISION MAKING: Evolving/moderate complexity  EVALUATION COMPLEXITY: Moderate  PLAN: PT FREQUENCY: 2x/week  PT DURATION: 8 weeks  PLANNED INTERVENTIONS: Therapeutic exercises, Therapeutic activity, Neuromuscular re-education, Balance training, Gait training, Patient/Family education, Self Care, Joint mobilization, Dry Needling, and Manual therapy  PLAN FOR NEXT SESSION:    Continue with progressive LE strengthening, gait/balance training.     8:48 AM, 10/31/22 Norman Herrlich PT  Physical Therapist - [facility]  Outpatient Physical Therapy- Main Campus [phone]

## 2022-11-02 LAB — KAPPA/LAMBDA LIGHT CHAINS
Kappa free light chain: 76.3 mg/L — ABNORMAL HIGH (ref 3.3–19.4)
Kappa, lambda light chain ratio: 2.03 — ABNORMAL HIGH (ref 0.26–1.65)
Lambda free light chains: 37.6 mg/L — ABNORMAL HIGH (ref 5.7–26.3)

## 2022-11-04 ENCOUNTER — Ambulatory Visit: Payer: Medicare HMO

## 2022-11-04 DIAGNOSIS — I639 Cerebral infarction, unspecified: Secondary | ICD-10-CM

## 2022-11-04 DIAGNOSIS — R278 Other lack of coordination: Secondary | ICD-10-CM | POA: Diagnosis not present

## 2022-11-04 DIAGNOSIS — M6281 Muscle weakness (generalized): Secondary | ICD-10-CM | POA: Diagnosis not present

## 2022-11-04 DIAGNOSIS — R262 Difficulty in walking, not elsewhere classified: Secondary | ICD-10-CM | POA: Diagnosis not present

## 2022-11-04 DIAGNOSIS — R2689 Other abnormalities of gait and mobility: Secondary | ICD-10-CM | POA: Diagnosis not present

## 2022-11-04 DIAGNOSIS — R269 Unspecified abnormalities of gait and mobility: Secondary | ICD-10-CM | POA: Diagnosis not present

## 2022-11-04 DIAGNOSIS — I6381 Other cerebral infarction due to occlusion or stenosis of small artery: Secondary | ICD-10-CM | POA: Diagnosis not present

## 2022-11-04 DIAGNOSIS — I63549 Cerebral infarction due to unspecified occlusion or stenosis of unspecified cerebellar artery: Secondary | ICD-10-CM | POA: Diagnosis not present

## 2022-11-04 LAB — MULTIPLE MYELOMA PANEL, SERUM
Albumin SerPl Elph-Mcnc: 3.3 g/dL (ref 2.9–4.4)
Albumin/Glob SerPl: 0.9 (ref 0.7–1.7)
Alpha 1: 0.3 g/dL (ref 0.0–0.4)
Alpha2 Glob SerPl Elph-Mcnc: 0.7 g/dL (ref 0.4–1.0)
B-Globulin SerPl Elph-Mcnc: 1.6 g/dL — ABNORMAL HIGH (ref 0.7–1.3)
Gamma Glob SerPl Elph-Mcnc: 1.5 g/dL (ref 0.4–1.8)
Globulin, Total: 4 g/dL — ABNORMAL HIGH (ref 2.2–3.9)
IgA: 693 mg/dL — ABNORMAL HIGH (ref 64–422)
IgG (Immunoglobin G), Serum: 1643 mg/dL — ABNORMAL HIGH (ref 586–1602)
IgM (Immunoglobulin M), Srm: 169 mg/dL (ref 26–217)
Total Protein ELP: 7.3 g/dL (ref 6.0–8.5)

## 2022-11-04 NOTE — Therapy (Signed)
OUTPATIENT OCCUPATIONAL THERAPY NEURO PROGRESS AND TREATMENT NOTE Reporting period 10/01/22-11/04/22   Patient Name: Susan Farrell MRN: [id number] DOB:September 22, 1945, 77 y.o., female Today's Date: 11/04/2022  PCP: Dr. Dion Saucier PROVIDER: Mariam Dollar, [place]  END OF SESSION:  OT End of Session - 11/04/22 1024     Visit Number 10    Number of Visits 24    Date for OT Re-Evaluation 12/24/22    Authorization Time Period Reporting period 10/01/22-11/04/22    Progress Note Due on Visit 10    OT Start Time 1020    OT Stop Time 1100    OT Time Calculation (min) 40 min    Activity Tolerance Patient tolerated treatment well    Behavior During Therapy [facility] for tasks assessed/performed            Past Medical History:  Diagnosis Date   Hypertension    Stroke [facility])    Past Surgical History:  Procedure Laterality Date   ABDOMINAL HYSTERECTOMY     CHOLECYSTECTOMY     ENDOVASCULAR REPAIR/STENT GRAFT N/A 06/12/2020   Procedure: ENDOVASCULAR REPAIR/STENT GRAFT;  Surgeon: Renford Dills, MD;  Location: [facility] INVASIVE CV LAB;  Service: Cardiovascular;  Laterality: N/A;   Patient Active Problem List   Diagnosis Date Noted   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) 09/12/2022   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) 09/09/2022   History of abdominal aortic aneurysm (AAA) repair 09/09/2022   HTN (hypertension) 09/09/2022   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   Anemia of chronic disease 09/09/2022   Tobacco abuse 09/09/2022   Thyroid nodule 09/09/2022   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) 09/02/2021   Hypertensive urgency 08/29/2021   Dyslipidemia 08/29/2021   Hypothyroidism 08/29/2021   Cerebellar stroke, acute (HCC) 08/28/2021   Ruptured abdominal aortic aneurysm (AAA) (HCC) 06/12/2020   Goiter 09/13/2019   Osteopenia 09/13/2019   ONSET DATE: 09/08/2022  REFERRING DIAG: Cerebellar  CVA  THERAPY DIAG:  Muscle weakness (generalized)  Other lack of coordination  Cerebellar stroke, acute (HCC)  Rationale for Evaluation and Treatment: Rehabilitation  SUBJECTIVE:  SUBJECTIVE STATEMENT: Pt reports doing well today. Pt accompanied by: self  PERTINENT HISTORY:  Pt. is a 77 y.o. female who had a Cerbellar CVA, Occlusion of the right cerebellar artery. 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 Pt. 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 No  FALLS: Has patient fallen in last 6 months? No  LIVING ENVIRONMENT: Lives with: lives with their spouse Lives in: House/apartment, one story home Stairs: Yes: External: 1 steps; none Has following equipment at home: Counselling psychologist, Environmental consultant - 2 wheeled, Environmental consultant - 4 wheeled, and bed side commode  PLOF: Independent, driving  PATIENT GOALS: To have her right arm be more steady  OBJECTIVE:   HAND DOMINANCE: Right  ADLs: Transfers/ambulation related to ADLs: Eating: independent with the left hand, has difficulty using dominant right hand 2/2 incoordination Grooming: Independent UB Dressing: Independent, increased time LB Dressing: Independent, increased time Toileting: Independent Bathing: Supervision, uses shower seat Tub Shower transfers: Supervision  IADLs: Shopping: Family-moved back home yesterday 2/2 Husband having COVID when Pt. D/d's from inpatient rehab Light housekeeping: Independent Meal Prep: Has not had a chance to perform meal prep yet. Community mobility: Relies  on family Medication management:  independent Financial management: Husband assist 2/2  difficulty with writing checks. Handwriting: 25% legible  MOBILITY STATUS: Needs Assist: uses a RW  ACTIVITY TOLERANCE: Activity tolerance: Fatigues after  greater than 30 min. Of activity  FUNCTIONAL OUTCOME MEASURES:  FOTO: 67, TR score: 79 FOTO: 11/04/22: 77  MAM-20: Sum score: 69/80:  Items scored 2 indicated as very hard to do:  Cutting nails, cutting meat, writing 3-4 sentences legibly, picking up a 1/2 full water pitcher  Items scored: as 3 indicated as a little hard to do:  Opening a wide mouth jar, using a fork/spoon, dialing/keying in phone numbers  MAM-20: 11/04/22: 76/80  UPPER EXTREMITY ROM:    Active ROM Right Eval WNL Left Eval WNL  Shoulder flexion    Shoulder abduction    Shoulder adduction    Shoulder extension    Shoulder internal rotation    Shoulder external rotation    Elbow flexion    Elbow extension    Wrist flexion    Wrist extension    Wrist ulnar deviation    Wrist radial deviation    Wrist pronation    Wrist supination    (Blank rows = not tested)  UPPER EXTREMITY MMT:     MMT Right eval Left eval  Shoulder flexion 5/5 5/5  Shoulder abduction 5/5 5/5  Shoulder adduction    Shoulder extension    Shoulder internal rotation    Shoulder external rotation    Middle trapezius    Lower trapezius    Elbow flexion 5/5 5/5  Elbow extension 5/5 5/5  Wrist flexion    Wrist extension 5/5 5/5  Wrist ulnar deviation    Wrist radial deviation    Wrist pronation    Wrist supination    (Blank rows = not tested)  HAND FUNCTION: Grip strength: Right: 43 lbs; Left: 45 lbs, Lateral pinch: Right: 15 lbs, Left: 11 lbs, and 3 point pinch: Right: 9 lbs, Left: 8 lbs 11/04/22: Grip strength: Right 54 lbs; Left: 45 lbs, Lateral pinch: Right: 15 lbs, Left: 13 lbs, and 3 point pinch: Right: 12 lbs, Left: 11 lbs COORDINATION: 9 Hole Peg test: Right: 54 sec. With multiple pegs dropped. Left: 32 sec 11/04/22: 9 hole Peg Test: Right: 31 sec; Left: 37 sec   SENSATION: Light touch: WFL Proprioception: WFL  EDEMA: N/A  MUSCLE TONE: Intact  COGNITION: Overall cognitive status: Within functional  limits for tasks assessed  VISION: Subjective report:  No vision changes Baseline vision: Wears glasses for reading only  PERCEPTION: WFL  PRAXIS: WFL  TODAY'S TREATMENT:                                                                                                                              DATE: 11/04/2022 Therapeutic Exercise: Objective measures taken and goals updated for progress note.  Therapeutic Activity: -Facilitated RUE FMC/GMC skills and reaching with accuracy: Pt picked up push pins from table top and worked to  place them within the center of background shapes of cork board.  Board placed on wedge and was positioned on table top to challenge forward and lateral reaching patterns.  -Facilitated RUE FMC/GMC skills placing 10 clothespins on a horizontal dowel.  Pt practiced quick alternating movements with pron/sup to rotate clothespins on dowel.  Horizontal dowel positioned to challenge forward and lateral reaching patterns at different heights above table top to further challenge reaching accuracy.  PATIENT EDUCATION: Education details:  daily tasks to challenge reaching accuracy Network engineer) Person educated: Patient Education method: Explanation Education comprehension: verbalized understanding  HOME EXERCISE PROGRAM:  Right hand strengthening with green theraputty, handwriting  GOALS: Goals reviewed with patient? Yes  SHORT TERM GOALS: Target date: 11/12/2022     Pt. Will be independent with HEPs for the RUE hand function Baseline: EVal: No current HEPs; 11/04/22: Pt working with green theraputty and handwriting skills regularly in the home Goal status: Achieved  LONG TERM GOALS: Target date: 12/24/2022    Pt. Will improve MAM-20 sum score by 5 points for improved hand function Baseline: Eval: MAM-20 Sum score: 69/80; 11/04/22: 76 Goal status: Achieved  2.  Pt. will increase FOTO score by 2 points to reflect Pt. perceived improvement with  assessment specific ADL/IADL's.  Baseline: Eval: FOTO score: 67; 11/04/22: 77 Goal status: Achieved  3.  Pt. Will be able to write 1 sentence with 75% legibility  in preparation for written correspondence. Baseline: Eval: Name only 25% legibility; 11/04/22: Pt able to write 1 sentence with 90% legibility. Goal status: Achieved  4.  Pt. Will improve right grip strength by 5# to be able to securely hold items without dropping them Baseline: Eval: Right: 43#, Left: 45#; 11/04/22: Right: 54 #, Left: 45  Goal status: Achieved/ongoing  5.  Pt. Will improve right hand [facility] skills by 3 sec. On the 9-hole peg test to be able to manipulate small objects Baseline: Eval: Right: 54 sec. With dropping multiple pegs, L: 32sec; 11/04/22: Right: 31 sec (1 dropped peg), L 37 sec Goal status: Achieved/ongoing  6.  Pt. Will be independently, and efficiently using her right hand to use utensils to cut, and eat her food Baseline:  Eval: Pt. Presents with difficulty using her right hand to use utensils; 11/04/22: improving; pt reports still some difficulty cutting meat Goal status: INITIAL  7.  Pt will improve RUE GMC skills to improve efficiency/thoroughness with brushing teeth.  Baseline: 11/04/22: Pt reports still some reduced control using R hand to brush teeth.  Goal status: New   8.  Pt will increase handwriting legibility to be able to fill out checks and medical forms independently.  Baseline: 11/04/22: Son fills out checks; decreased legibility when confined to writing with short line height/small spaces.  Goal status: New   ASSESSMENT: CLINICAL IMPRESSION: Pt seen for 10th visit progress update.  FOTO score has improved from 67 to 77, and MAM-20 score has improved from 69 to 76/80.  Pt shows good improvements in R grip strength and [facility] skills with 9 hole peg test improvement (see above for measures).  Pt continues to present with mild ataxia with GMC and FMC movements in the R dominant arm.  Pt  still reporting some increased difficulty with brushing teeth, reaching toward a target (emptying dishwasher and putting away dishes) and writing legibility when given small spaces (checks/medical forms).  Pt. continues to benefit from OT services for ADL training, A/E training, there. Ex., neuromuscular re-education, and Pt. education in order to  improve RUE functioning, and increase engagement in ADL/IADL tasks, and maximize overall independence.   PERFORMANCE DEFICITS: in functional skills including ADLs, IADLs, coordination, dexterity, and strength.   IMPAIRMENTS: are limiting patient from ADLs, IADLs, leisure, and social participation.   CO-MORBIDITIES: may have co-morbidities  that affects occupational performance. Patient will benefit from skilled OT to address above impairments and improve overall function.  MODIFICATION OR ASSISTANCE TO COMPLETE EVALUATION: Min-Moderate modification of tasks or assist with assess necessary to complete an evaluation.  OT OCCUPATIONAL PROFILE AND HISTORY: Detailed assessment: Review of records and additional review of physical, cognitive, psychosocial history related to current functional performance.  CLINICAL DECISION MAKING: Moderate - several treatment options, min-mod task modification necessary  REHAB POTENTIAL: Good  EVALUATION COMPLEXITY: Moderate    PLAN:  OT FREQUENCY: 2x/week  OT DURATION: 12 weeks  PLANNED INTERVENTIONS: self care/ADL training, therapeutic exercise, therapeutic activity, neuromuscular re-education, manual therapy, functional mobility training, paraffin, moist heat, contrast bath, and patient/family education  RECOMMENDED OTHER SERVICES: PT  CONSULTED AND AGREED WITH PLAN OF CARE: Patient  PLAN FOR NEXT SESSION: see above  Danelle Earthly, MS, OTR/L  11/04/2022, 11:08 AM

## 2022-11-06 ENCOUNTER — Ambulatory Visit: Payer: Medicare HMO | Admitting: Occupational Therapy

## 2022-11-06 ENCOUNTER — Encounter: Payer: Self-pay | Admitting: Physical Medicine & Rehabilitation

## 2022-11-06 ENCOUNTER — Encounter: Payer: Medicare HMO | Attending: Physical Medicine & Rehabilitation | Admitting: Physical Medicine & Rehabilitation

## 2022-11-06 VITALS — BP 131/82 | HR 70 | Ht 66.0 in | Wt 164.0 lb

## 2022-11-06 DIAGNOSIS — R2689 Other abnormalities of gait and mobility: Secondary | ICD-10-CM | POA: Diagnosis not present

## 2022-11-06 DIAGNOSIS — R278 Other lack of coordination: Secondary | ICD-10-CM | POA: Diagnosis not present

## 2022-11-06 DIAGNOSIS — R269 Unspecified abnormalities of gait and mobility: Secondary | ICD-10-CM | POA: Diagnosis not present

## 2022-11-06 DIAGNOSIS — I639 Cerebral infarction, unspecified: Secondary | ICD-10-CM | POA: Diagnosis not present

## 2022-11-06 DIAGNOSIS — M6281 Muscle weakness (generalized): Secondary | ICD-10-CM

## 2022-11-06 DIAGNOSIS — R262 Difficulty in walking, not elsewhere classified: Secondary | ICD-10-CM | POA: Diagnosis not present

## 2022-11-06 DIAGNOSIS — I69354 Hemiplegia and hemiparesis following cerebral infarction affecting left non-dominant side: Secondary | ICD-10-CM | POA: Diagnosis not present

## 2022-11-06 DIAGNOSIS — I6381 Other cerebral infarction due to occlusion or stenosis of small artery: Secondary | ICD-10-CM | POA: Diagnosis not present

## 2022-11-06 DIAGNOSIS — I63549 Cerebral infarction due to unspecified occlusion or stenosis of unspecified cerebellar artery: Secondary | ICD-10-CM | POA: Diagnosis not present

## 2022-11-06 NOTE — Therapy (Signed)
OUTPATIENT OCCUPATIONAL THERAPY NEURO TREATMENT NOTE   Patient Name: Susan Farrell MRN: [id number] DOB:1945/12/25, 77 y.o., female Today's Date: 11/06/2022  PCP: Dr. Marcello Fennel REFERRING PROVIDER: Mariam Dollar, [place]  END OF SESSION:  OT End of Session - 11/06/22 0851     Visit Number 11    Number of Visits 24    Date for OT Re-Evaluation 12/24/22    OT Start Time 0845    OT Stop Time 0930    OT Time Calculation (min) 45 min    Activity Tolerance Patient tolerated treatment well    Behavior During Therapy [facility] for tasks assessed/performed            Past Medical History:  Diagnosis Date   Hypertension    Stroke [facility])    Past Surgical History:  Procedure Laterality Date   ABDOMINAL HYSTERECTOMY     CHOLECYSTECTOMY     ENDOVASCULAR REPAIR/STENT GRAFT N/A 06/12/2020   Procedure: ENDOVASCULAR REPAIR/STENT GRAFT;  Surgeon: Renford Dills, MD;  Location: [facility] INVASIVE CV LAB;  Service: Cardiovascular;  Laterality: N/A;   Patient Active Problem List   Diagnosis Date Noted   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) 09/12/2022   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) 09/09/2022   History of abdominal aortic aneurysm (AAA) repair 09/09/2022   HTN (hypertension) 09/09/2022   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   Anemia of chronic disease 09/09/2022   Tobacco abuse 09/09/2022   Thyroid nodule 09/09/2022   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) 09/02/2021   Hypertensive urgency 08/29/2021   Dyslipidemia 08/29/2021   Hypothyroidism 08/29/2021   Cerebellar stroke, acute (HCC) 08/28/2021   Ruptured abdominal aortic aneurysm (AAA) (HCC) 06/12/2020   Goiter 09/13/2019   Osteopenia 09/13/2019   ONSET DATE: 09/08/2022  REFERRING DIAG: Cerebellar CVA  THERAPY DIAG:  Muscle weakness (generalized)  Rationale for Evaluation and Treatment: Rehabilitation  SUBJECTIVE:  SUBJECTIVE  STATEMENT: Pt reports doing well today, and has been busy with the [employer] Pt accompanied by: self  PERTINENT HISTORY:  Pt. is a 77 y.o. female who had a Cerbellar CVA, Occlusion of the right cerebellar artery. 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 Pt. 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 No  FALLS: Has patient fallen in last 6 months? No  LIVING ENVIRONMENT: Lives with: lives with their spouse Lives in: House/apartment, one story home Stairs: Yes: External: 1 steps; none Has following equipment at home: Counselling psychologist, Environmental consultant - 2 wheeled, Environmental consultant - 4 wheeled, and bed side commode  PLOF: Independent, driving  PATIENT GOALS: To have her right arm be more steady  OBJECTIVE:   HAND DOMINANCE: Right  ADLs: Transfers/ambulation related to ADLs: Eating: independent with the left hand, has difficulty using dominant right hand 2/2 incoordination Grooming: Independent UB Dressing: Independent, increased time LB Dressing: Independent, increased time Toileting: Independent Bathing: Supervision, uses shower seat Tub Shower transfers: Supervision  IADLs: Shopping: Family-moved back home yesterday 2/2 Husband having COVID when Pt. D/d's from inpatient rehab Light housekeeping: Independent Meal Prep: Has not had a chance to perform meal prep yet. Community mobility: Relies  on family Medication management: independent Financial management: Husband assist 2/2  difficulty with writing checks. Handwriting: 25% legible  MOBILITY STATUS: Needs Assist: uses a RW  ACTIVITY TOLERANCE: Activity tolerance: Fatigues after greater than 30 min. Of activity  FUNCTIONAL OUTCOME MEASURES:  FOTO: 67, TR score: 79 FOTO: 11/04/22: 77  MAM-20: Sum score:  69/80:  Items scored 2 indicated as very hard to do:  Cutting nails, cutting meat, writing 3-4 sentences legibly, picking up a 1/2 full water pitcher  Items scored: as 3 indicated as a little hard to do:  Opening a wide mouth jar, using a fork/spoon, dialing/keying in phone numbers  MAM-20: 11/04/22: 76/80  UPPER EXTREMITY ROM:    Active ROM Right Eval WNL Left Eval WNL  Shoulder flexion    Shoulder abduction    Shoulder adduction    Shoulder extension    Shoulder internal rotation    Shoulder external rotation    Elbow flexion    Elbow extension    Wrist flexion    Wrist extension    Wrist ulnar deviation    Wrist radial deviation    Wrist pronation    Wrist supination    (Blank rows = not tested)  UPPER EXTREMITY MMT:     MMT Right eval Left eval  Shoulder flexion 5/5 5/5  Shoulder abduction 5/5 5/5  Shoulder adduction    Shoulder extension    Shoulder internal rotation    Shoulder external rotation    Middle trapezius    Lower trapezius    Elbow flexion 5/5 5/5  Elbow extension 5/5 5/5  Wrist flexion    Wrist extension 5/5 5/5  Wrist ulnar deviation    Wrist radial deviation    Wrist pronation    Wrist supination    (Blank rows = not tested)  HAND FUNCTION: Grip strength: Right: 43 lbs; Left: 45 lbs, Lateral pinch: Right: 15 lbs, Left: 11 lbs, and 3 point pinch: Right: 9 lbs, Left: 8 lbs 11/04/22: Grip strength: Right 54 lbs; Left: 45 lbs, Lateral pinch: Right: 15 lbs, Left: 13 lbs, and 3 point pinch: Right: 12 lbs, Left: 11 lbs COORDINATION: 9 Hole Peg test: Right: 54 sec. With multiple pegs dropped. Left: 32 sec 11/04/22: 9 hole Peg Test: Right: 31 sec; Left: 37 sec   SENSATION: Light touch: WFL Proprioception: WFL  EDEMA: N/A  MUSCLE TONE: Intact  COGNITION: Overall cognitive status: Within functional limits for tasks assessed  VISION: Subjective report:  No vision changes Baseline vision: Wears glasses for reading  only  PERCEPTION: WFL  PRAXIS: WFL  TODAY'S TREATMENT:                                                                                                                              DATE: 11/06/2022  Pt. worked on right hand [facility] skills manipulating nuts, and bolts on a bolt board. Pt. worked on screwing, and unscrewing nuts, and bolts of varying sizes, and challenging progressively smaller items with vision occluded to promote sensory awareness. Pt. Worked on right hand [facility] skills moving a Museum/gallery curator through a series of mazes  of progressively increasing levels of complexity with vision occluded. Pt. Worked on translatory movements with 1/2" flat marbles, moving them from the palm to the tip of the 2nd dogot, and thumb in preparation for discarding them into a dish. Pt. worked on  right  [facility] skills grasping 1" sticks, 1/4" collars, and 1/4" washers. Pt. worked on storing the objects in the palm, and translatory skills moving the items from the palm of the hand to the tip of the 2nd digit, and thumb. Pt. worked on removing the pegs using bilateral alternating hand patterns.    PATIENT EDUCATION: Education details: Proprioception through a flat hand at the tabletop during tasks. Person educated: Patient Education method: Explanation Education comprehension: verbalized understanding  HOME EXERCISE PROGRAM:  Right hand strengthening with green theraputty, handwriting, [facility]  GOALS: Goals reviewed with patient? Yes  SHORT TERM GOALS: Target date: 11/12/2022     Pt. Will be independent with HEPs for the RUE hand function Baseline: EVal: No current HEPs; 11/04/22: Pt working with green theraputty and handwriting skills regularly in the home Goal status: Achieved  LONG TERM GOALS: Target date: 12/24/2022    Pt. Will improve MAM-20 sum score by 5 points for improved hand function Baseline: Eval: MAM-20 Sum score: 69/80; 11/04/22: 76 Goal status: Achieved  2.  Pt. will increase FOTO score by  2 points to reflect Pt. perceived improvement with assessment specific ADL/IADL's.  Baseline: Eval: FOTO score: 67; 11/04/22: 77 Goal status: Achieved  3.  Pt. Will be able to write 1 sentence with 75% legibility  in preparation for written correspondence. Baseline: Eval: Name only 25% legibility; 11/04/22: Pt able to write 1 sentence with 90% legibility. Goal status: Achieved  4.  Pt. Will improve right grip strength by 5# to be able to securely hold items without dropping them Baseline: Eval: Right: 43#, Left: 45#; 11/04/22: Right: 54 #, Left: 45  Goal status: Achieved/ongoing  5.  Pt. Will improve right hand [facility] skills by 3 sec. On the 9-hole peg test to be able to manipulate small objects Baseline: Eval: Right: 54 sec. With dropping multiple pegs, L: 32sec; 11/04/22: Right: 31 sec (1 dropped peg), L 37 sec Goal status: Achieved/ongoing  6.  Pt. Will be independently, and efficiently using her right hand to use utensils to cut, and eat her food Baseline:  Eval: Pt. Presents with difficulty using her right hand to use utensils; 11/04/22: improving; pt reports still some difficulty cutting meat Goal status: INITIAL  7.  Pt will improve RUE [facility] skills to improve efficiency/thoroughness with brushing teeth.  Baseline: 11/04/22: Pt reports still some reduced control using R hand to brush teeth.  Goal status: New   8.  Pt will increase handwriting legibility to be able to fill out checks and medical forms independently.  Baseline: 11/04/22: Son fills out checks; decreased legibility when confined to writing with short line height/small spaces.  Goal status: New   ASSESSMENT: CLINICAL IMPRESSION:  Pt. continues to make progress with her RUE functioning.  Pt. Was able to independently, and efficiently manipulate the bolts with the left hand with vision occluded. Pt. Was able to control the [facility] magnetic with her vision occluded with minimal deviation over the border lines. Pt. Presents  with difficulty performing translatory movements with the right hand, often dropping the 1" sticks through her finger tips. Pt. required reps of proprioception through and open flat hand at the table during the Purdue Pegboard task. Pt. continues to benefit from OT services  for ADL training, A/E training, there. Ex., neuromuscular re-education, and Pt. education in order to improve RUE functioning, and increase engagement in ADL/IADL tasks, and maximize overall independence.   PERFORMANCE DEFICITS: in functional skills including ADLs, IADLs, coordination, dexterity, and strength.   IMPAIRMENTS: are limiting patient from ADLs, IADLs, leisure, and social participation.   CO-MORBIDITIES: may have co-morbidities  that affects occupational performance. Patient will benefit from skilled OT to address above impairments and improve overall function.  MODIFICATION OR ASSISTANCE TO COMPLETE EVALUATION: Min-Moderate modification of tasks or assist with assess necessary to complete an evaluation.  OT OCCUPATIONAL PROFILE AND HISTORY: Detailed assessment: Review of records and additional review of physical, cognitive, psychosocial history related to current functional performance.  CLINICAL DECISION MAKING: Moderate - several treatment options, min-mod task modification necessary  REHAB POTENTIAL: Good  EVALUATION COMPLEXITY: Moderate    PLAN:  OT FREQUENCY: 2x/week  OT DURATION: 12 weeks  PLANNED INTERVENTIONS: self care/ADL training, therapeutic exercise, therapeutic activity, neuromuscular re-education, manual therapy, functional mobility training, paraffin, moist heat, contrast bath, and patient/family education  RECOMMENDED OTHER SERVICES: PT  CONSULTED AND AGREED WITH PLAN OF CARE: Patient  PLAN FOR NEXT SESSION: see above  Olegario Messier, MS, OTR/L   11/06/2022, 8:54 AM

## 2022-11-06 NOTE — Progress Notes (Signed)
Subjective:    Patient ID: Susan Farrell, female    DOB: [birth year], 77 y.o.   MRN: [id number]   DONETTA ISAZA was admitted to rehab 09/12/2022 for inpatient therapies to consist of PT, ST and OT at least three hours five days a week. Past admission physiatrist, therapy team and rehab RN have worked together to provide customized collaborative inpatient rehab.  Pertaining to patient's right SCA territory infarction with right hemiataxia as well as history of right basal ganglia infarction 2023 with residual left-sided weakness.  She would remain on low-dose aspirin 81 mg daily and Plavix 75 mg daily x 90 days then aspirin alone with follow-up per neurology services.  She continued on Lovenox during her hospital stay for DVT prophylaxis no bleeding episodes.  Permissive hypertension she was on her Norvasc and will continue to follow as well as Toprol XL.  Synthroid ongoing for hypothyroidism.  Crestor for hyperlipidemia.  History of microcytic anemia and currently on Niferex.  Follow-up labs with stable improved H and H to 9.9/31.5. Chemistry panel WNL. Discoid lupus no acute issues patient uses topical medications only if needed prior to admission and follow-up outpatient.  History of tobacco use Chantix as directed providing counsel regarding cessation of nicotine products  Admit date: 09/12/2022 Discharge date: 09/25/2022 HPI 77 year old female with right basal ganglia infarct approximately 1 year ago with residual left hemiparesis who had onset of right Hemi ataxia in August of this year requiring hospitalization for right superior cerebellar artery infarct.  She went through inpatient rehabilitation and is now home again.  She is overall doing well.  Her husband drives her to her appointments but she has tried driving with her son and states that this has  gone okay. Mod I dressing and bathing uses cane in home  Uses rollator on MD visit   No cigarette use  Pain Inventory Average Pain 0 Pain  Right Now 0 My pain is  no pain  In the last 24 hours, has pain interfered with the following? General activity 0 Relation with others 0 Enjoyment of life 0 What TIME of day is your pain at its worst? varies Sleep (in general) Good  Pain is worse with:  no pain Pain improves with:  no pain Relief from Meds:  no pain  Family History  Problem Relation Age of Onset   Hypertension Mother    Hypertension Father    Diabetes Sister    Breast cancer Neg Hx    Social History   Socioeconomic History   Marital status: Married    Spouse name: Not on file   Number of children: Not on file   Years of education: Not on file   Highest education level: Not on file  Occupational History   Not on file  Tobacco Use   Smoking status: Every Day    Types: Cigarettes   Smokeless tobacco: Current  Vaping Use   Vaping status: Never Used  Substance and Sexual Activity   Alcohol use: Not Currently   Drug use: Never   Sexual activity: Yes  Other Topics Concern   Not on file  Social History Narrative   Not on file   Social Determinants of Health   Financial Resource Strain: Low Risk  (10/23/2022)   Received from [facility]   Overall Financial Resource Strain (CARDIA)    Difficulty of Paying Living Expenses: Not hard at all  Food Insecurity: No Food Insecurity (10/23/2022)   Received from [facility]   Hunger Vital Sign    Worried About Running Out of Food in the Last Year: Never true    Ran Out of Food in the Last Year: Never true  Transportation Needs: No Transportation Needs (10/23/2022)   Received from [facility] - Transportation    In the past 12 months, has lack of transportation kept you from medical appointments or from getting medications?: No    Lack of Transportation (Non-Medical): No  Physical Activity: Insufficiently Active (05/12/2019)   Received from [facility], [facility]   Exercise Vital Sign    Days of Exercise per Week: 2 days    Minutes of Exercise per Session: 30 min  Stress: No Stress Concern Present (04/21/2022)   [name] of Occupational Health - Occupational Stress Questionnaire    Feeling of Stress : Not at all  Social Connections: Socially Integrated (05/12/2019)   Received from [facility], [facility]   Social Connection and Isolation Panel [NHANES]    Frequency of Communication with Friends and Family: More than three times a week    Frequency of Social Gatherings with Friends and Family: More than three times a week    Attends Religious Services: More than 4 times per year    Active Member of Clubs or Organizations: Yes    Attends Banker Meetings: More than 4 times per year    Marital Status: Married   Past Surgical History:  Procedure Laterality Date   ABDOMINAL HYSTERECTOMY     CHOLECYSTECTOMY     ENDOVASCULAR REPAIR/STENT GRAFT N/A 06/12/2020   Procedure: ENDOVASCULAR REPAIR/STENT GRAFT;  Surgeon: Renford Dills, MD;  Location: [facility] INVASIVE CV LAB;  Service: Cardiovascular;  Laterality: N/A;   Past Surgical History:  Procedure Laterality Date   ABDOMINAL HYSTERECTOMY     CHOLECYSTECTOMY     ENDOVASCULAR REPAIR/STENT GRAFT N/A 06/12/2020   Procedure: ENDOVASCULAR REPAIR/STENT GRAFT;  Surgeon: Renford Dills, MD;  Location: [facility] INVASIVE CV LAB;  Service: Cardiovascular;  Laterality: N/A;   Past Medical History:  Diagnosis Date   Hypertension    Stroke (HCC)    BP 131/82   Pulse 70   Ht 5\' 6"  (1.676 m)   Wt 164 lb (74.4 kg)   SpO2 99%   BMI 26.47 kg/m   Opioid Risk Score:   Fall Risk Score:  `1  Depression screen [facility] 2/9     10/08/2022   12:44 PM 11/14/2021   10:40 AM 09/25/2021    1:04 PM 09/25/2021    1:01 PM  Depression screen PHQ 2/9  Decreased Interest 0 0 0 0  Down, Depressed, Hopeless 0 0 0 0  PHQ - 2 Score 0 0 0 0  Altered sleeping 0  0    Tired, decreased energy 2  0   Change in appetite 0  0   Feeling bad or failure about yourself  0  0   Trouble concentrating 0  0   Moving slowly or fidgety/restless 1  0   Suicidal thoughts 0  0   PHQ-9 Score 3  0   Difficult doing work/chores Somewhat difficult  Not difficult at all     Review of Systems  All other systems reviewed and are negative.     Objective:   Physical Exam  General No acute distress Mood and affect appropriate Speech without dysarthria or aphasia. Cerebellar: Mild dysmetria right  finger-nose-finger no evidence of dysmetria right heel-to-shin. Left upper extremity no evidence of dysmetria left lower extremity has weakness limiting testing. Motor strength is 5/5 in the right deltoid bicep tricep grip as well as hip flexor knee extensor ankle dorsiflexor Left upper extremity has 4/5 strength left lower extremity has 3/5 strength proximally and trace at the ankle.  Wears a left AFO. Sensation is reported is equal to light touch bilateral upper and lower limbs Tone is normal No evidence of visual field cut Extraocular muscles intact Ambulates with rolling walker no evidence of toe drag or knee instability on the right side has AFO on the left side.     Assessment & Plan:   1.  Recent right superior cerebellar artery infarct with mild residual right upper extremity ataxia. Overall doing very well has almost gotten back to her premorbid level of activity.  She does have the chronic left hemiparesis from the CVA from 1 year ago. Physical medicine rehab follow-up in 3 months.  Graduated return to driving instructions were provided. It is recommended that the patient first drives with another licensed driver in an empty parking lot. If the patient does well with this, and they can drive on a quiet street with the licensed driver. If the patient does well with this they can drive on a busy street with a licensed driver. If the patient does well with this, the next  time out they can go by himself.  I recommend no nighttime or Interstate driving.

## 2022-11-10 NOTE — Therapy (Signed)
OUTPATIENT PHYSICAL THERAPY TREATMENT/Physical Therapy Progress Note   Dates of reporting period  10/01/2022   to   11/11/2022     Patient Name: Susan Farrell MRN: [id number] DOB:September 14, 1945, 77 y.o., female Today's Date: 11/11/2022   PCP: Barbette Reichmann, MD REFERRING PROVIDER: Charlton Amor, PA-C Purpose for referral, evaluation, treatment: Rehabilitation.   PT End of Session - 11/11/22 0932     Visit Number 10    Number of Visits 16    Date for PT Re-Evaluation 11/26/22    Authorization Type [employer]    Authorization Time Period 10/01/22-11/26/22    Progress Note Due on Visit 20    PT Start Time 0930    PT Stop Time 1013    PT Time Calculation (min) 43 min    Equipment Utilized During Treatment Gait belt    Activity Tolerance Patient tolerated treatment well;No increased pain    Behavior During Therapy [facility] for tasks assessed/performed                      Past Medical History:  Diagnosis Date   Hypertension    Stroke [facility])    Past Surgical History:  Procedure Laterality Date   ABDOMINAL HYSTERECTOMY     CHOLECYSTECTOMY     ENDOVASCULAR REPAIR/STENT GRAFT N/A 06/12/2020   Procedure: ENDOVASCULAR REPAIR/STENT GRAFT;  Surgeon: Renford Dills, MD;  Location: [facility] INVASIVE CV LAB;  Service: Cardiovascular;  Laterality: N/A;     ONSET DATE: 08/28/21  REFERRING DIAG: I63.81 (ICD-10-CM) - 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 States feeling some better today. Denies any other pain and no falls.   PERTINENT HISTORY:   Damisha sustained a cerebelar CVA in August 2024, rapid progression in symptom improved over first week, DC acute to CIR, then home ot son's house for 5 days and finally back ot her house 2 days prior to this eval. Pt has been using RW at home since DC, no rollator use yet.   PAIN:  Are you having pain? No  PRECAUTIONS: Fall  WEIGHT BEARING RESTRICTIONS No  FALLS: Has patient fallen in last 6 months? No   LIVING ENVIRONMENT: Lives with: lives with their family and lives with their partner Lives in: House/apartment Stairs: Yes: External: 1 steps; none Has following equipment at home: Dan Humphreys - 2 wheeled, Environmental consultant - 4 wheeled, bed side commode, and only uses bed side commode at night   PLOF: Independent, Independent with basic ADLs, Independent with household mobility without device, Independent with community mobility without device, Independent with gait, and Leisure: going to church, going to grocery and department stores.   PATIENT GOALS Get back to normal walking and balance without any support.   OBJECTIVE:   FUNCTIONAL TESTs:  5 times sit to stand: 19 sec hands on knees   Last years 6 minute walk test: 234 feet and requires seated rest break due to lower extremity fatigue on the left lower extremity  Last years Berg Balance Scale: 36  PATIENT SURVEYS:  FOTO 42  TODAY'S TREATMENT:  Physical therapy treatment session today consisted of completing assessment of goals and administration of testing as demonstrated and documented in flow sheet, treatment, and goals section of this note. Addition treatments may be found below.    PATIENT EDUCATION: Education details: POC Person educated: Patient Education method: Explanation Education comprehension: verbalized understanding   HOME EXERCISE  PROGRAM:  Access Code: [email or web address] URL: [email or web address] Date: 10/16/2022 Prepared by: Maureen Ralphs  Exercises - Supine Active Straight Leg Raise  - 1 x daily - 3 x weekly - 3 sets - 10 reps - Supine Hip Abduction AROM  - 1 x daily - 3 x weekly - 3 sets - 10 reps - Sidelying Hip Abduction  - 1 x daily - 3 x weekly - 3 sets - 10 reps - Sidelying Hip Abduction  - 1 x daily - 3 x weekly - 3 sets - 10 reps - Sidelying Hip Extension in Abduction  - 1 x daily - 3 x weekly - 3 sets - 10 reps - Prone Hip Extension  - 1 x daily - 3 x weekly - 3 sets - 10 reps    GOALS:  Goals reviewed with patient? Yes  SHORT TERM GOALS: Target date: 10/29/22  Patient will be independent in updated home exercise program to improve strength/mobility for better functional independence with ADLs. Baseline:  eval: still working on [employer]; 11/11/2022- Patient reports working on current HEP without any questions or issues.  Goal status: MET  LONG TERM GOALS: Target date: 11/26/22 Patient will improve FOTO score by >14 to indicate subjective reduction in difficulty performing ADL/IADL tasks/mobility.  Baseline: eval: 42; 11/11/2022 Goal status: INITIAL   2.  Patient will demonstrate 5xSTS improvement in time and use of UE support to indicate improved power, strength, motor control, and reduced pain/dizziness.  Baseline eval: 19sec with hands on knees;  11/11/2022=15.84 sec without UE support.  Goal status: PROGRESSING   3.   The pt will improve to >0.30m/s to improve ability to safely participate in typical work and/or leisure  Baseline: eval: 24.83sec c RW; 11/11/2022= 0.74m/s Goal status: PROGRESSING    4.  Patient will improve score objective balance assessment by > 6 points in BERG test to indicate reduced falls risk and improved safety at home.   Baseline: 9/17: 25/ 56; 11/11/2022= 43/56 Goal status: Progressing    ASSESSMENT:   Patient's condition has the potential to  improve in response to therapy. Maximum improvement is yet to be obtained. The  anticipated improvement is attainable and reasonable in a generally predictable time.   Patient would benefit from skilled physical therapy to increase stability and mobility for improved quality of life.    OBJECTIVE IMPAIRMENTS Abnormal gait, decreased activity tolerance, decreased balance, decreased coordination, decreased endurance, decreased mobility, difficulty walking, and decreased strength.   ACTIVITY LIMITATIONS lifting, bending, standing, squatting, stairs, dressing, and caring for others  PARTICIPATION LIMITATIONS: meal prep, cleaning, driving, shopping, yard work, and church  PERSONAL FACTORS Age are also affecting patient's functional outcome.   REHAB POTENTIAL: Good  CLINICAL DECISION MAKING: Evolving/moderate complexity  EVALUATION COMPLEXITY: Moderate  PLAN: PT FREQUENCY: 2x/week  PT DURATION: 8 weeks  PLANNED INTERVENTIONS: Therapeutic exercises, Therapeutic activity, Neuromuscular re-education, Balance training, Gait training, Patient/Family education, Self Care, Joint mobilization, Dry Needling, and Manual therapy  PLAN FOR NEXT SESSION:    Continue with progressive LE strengthening, gait/balance training.     10:55 PM, 11/11/22 Lenda Kelp PT  Physical Therapist - [place] [facility]  Outpatient Physical Therapy- Main Campus [phone]

## 2022-11-11 ENCOUNTER — Ambulatory Visit: Payer: Medicare HMO | Admitting: Occupational Therapy

## 2022-11-11 ENCOUNTER — Ambulatory Visit: Payer: Medicare HMO

## 2022-11-11 DIAGNOSIS — R2689 Other abnormalities of gait and mobility: Secondary | ICD-10-CM | POA: Diagnosis not present

## 2022-11-11 DIAGNOSIS — R269 Unspecified abnormalities of gait and mobility: Secondary | ICD-10-CM | POA: Diagnosis not present

## 2022-11-11 DIAGNOSIS — R262 Difficulty in walking, not elsewhere classified: Secondary | ICD-10-CM

## 2022-11-11 DIAGNOSIS — M6281 Muscle weakness (generalized): Secondary | ICD-10-CM

## 2022-11-11 DIAGNOSIS — I63549 Cerebral infarction due to unspecified occlusion or stenosis of unspecified cerebellar artery: Secondary | ICD-10-CM | POA: Diagnosis not present

## 2022-11-11 DIAGNOSIS — R278 Other lack of coordination: Secondary | ICD-10-CM

## 2022-11-11 DIAGNOSIS — I639 Cerebral infarction, unspecified: Secondary | ICD-10-CM

## 2022-11-11 DIAGNOSIS — I6381 Other cerebral infarction due to occlusion or stenosis of small artery: Secondary | ICD-10-CM | POA: Diagnosis not present

## 2022-11-11 NOTE — Therapy (Addendum)
OUTPATIENT OCCUPATIONAL THERAPY NEURO TREATMENT NOTE   Patient Name: Susan Farrell MRN: [id number] DOB:1945/02/24, 77 y.o., female Today's Date: 11/11/2022  PCP: Dr. Marcello Fennel REFERRING PROVIDER: Mariam Dollar, [place]  END OF SESSION:  OT End of Session - 11/11/22 0858     Visit Number 12    Number of Visits 24    Date for OT Re-Evaluation 12/24/22    OT Start Time 0845    OT Stop Time 0930    OT Time Calculation (min) 45 min    Activity Tolerance Patient tolerated treatment well    Behavior During Therapy [facility] for tasks assessed/performed            Past Medical History:  Diagnosis Date   Hypertension    Stroke [facility])    Past Surgical History:  Procedure Laterality Date   ABDOMINAL HYSTERECTOMY     CHOLECYSTECTOMY     ENDOVASCULAR REPAIR/STENT GRAFT N/A 06/12/2020   Procedure: ENDOVASCULAR REPAIR/STENT GRAFT;  Surgeon: Renford Dills, MD;  Location: [facility] INVASIVE CV LAB;  Service: Cardiovascular;  Laterality: N/A;   Patient Active Problem List   Diagnosis Date Noted   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) 09/12/2022   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) 09/09/2022   History of abdominal aortic aneurysm (AAA) repair 09/09/2022   HTN (hypertension) 09/09/2022   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   Anemia of chronic disease 09/09/2022   Tobacco abuse 09/09/2022   Thyroid nodule 09/09/2022   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) 09/02/2021   Hypertensive urgency 08/29/2021   Dyslipidemia 08/29/2021   Hypothyroidism 08/29/2021   Cerebellar stroke, acute (HCC) 08/28/2021   Ruptured abdominal aortic aneurysm (AAA) (HCC) 06/12/2020   Goiter 09/13/2019   Osteopenia 09/13/2019   ONSET DATE: 09/08/2022  REFERRING DIAG: Cerebellar CVA  THERAPY DIAG:  Muscle weakness (generalized)  Other lack of coordination  Rationale for Evaluation and Treatment:  Rehabilitation  SUBJECTIVE:  SUBJECTIVE STATEMENT: Pt reports doing well today, and has been busy with the [employer] celebrations Pt accompanied by: self  PERTINENT HISTORY:  Pt. is a 77 y.o. female who had a Cerbellar CVA, Occlusion of the right cerebellar artery. 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 Pt. 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 Right hand pain 7-8  FALLS: Has patient fallen in last 6 months? No  LIVING ENVIRONMENT: Lives with: lives with their spouse Lives in: House/apartment, one story home Stairs: Yes: External: 1 steps; none Has following equipment at home: Counselling psychologist, Environmental consultant - 2 wheeled, Environmental consultant - 4 wheeled, and bed side commode  PLOF: Independent, driving  PATIENT GOALS: To have her right arm be more steady  OBJECTIVE:   HAND DOMINANCE: Right  ADLs: Transfers/ambulation related to ADLs: Eating: independent with the left hand, has difficulty using dominant right hand 2/2 incoordination Grooming: Independent UB Dressing: Independent, increased time LB Dressing: Independent, increased time Toileting: Independent Bathing: Supervision, uses shower seat Tub Shower transfers: Supervision  IADLs: Shopping: Family-moved back home yesterday 2/2 Husband having COVID when Pt. D/d's from inpatient rehab Light housekeeping: Independent Meal Prep: Has not had a chance to perform meal prep yet. Community mobility: Relies  on family Medication management: independent Financial management: Husband assist 2/2  difficulty with writing checks. Handwriting: 25% legible  MOBILITY STATUS: Needs Assist: uses a RW  ACTIVITY TOLERANCE: Activity tolerance: Fatigues after greater than 30 min. Of activity  FUNCTIONAL OUTCOME MEASURES:  FOTO: 67, TR  score: 79 FOTO: 11/04/22: 77  MAM-20: Sum score: 69/80:  Items scored 2 indicated as very hard to do:  Cutting nails, cutting meat, writing 3-4 sentences legibly, picking up a 1/2 full water pitcher  Items scored: as 3 indicated as a little hard to do:  Opening a wide mouth jar, using a fork/spoon, dialing/keying in phone numbers  MAM-20: 11/04/22: 76/80  UPPER EXTREMITY ROM:    Active ROM Right Eval WNL Left Eval WNL  Shoulder flexion    Shoulder abduction    Shoulder adduction    Shoulder extension    Shoulder internal rotation    Shoulder external rotation    Elbow flexion    Elbow extension    Wrist flexion    Wrist extension    Wrist ulnar deviation    Wrist radial deviation    Wrist pronation    Wrist supination    (Blank rows = not tested)  UPPER EXTREMITY MMT:     MMT Right eval Left eval  Shoulder flexion 5/5 5/5  Shoulder abduction 5/5 5/5  Shoulder adduction    Shoulder extension    Shoulder internal rotation    Shoulder external rotation    Middle trapezius    Lower trapezius    Elbow flexion 5/5 5/5  Elbow extension 5/5 5/5  Wrist flexion    Wrist extension 5/5 5/5  Wrist ulnar deviation    Wrist radial deviation    Wrist pronation    Wrist supination    (Blank rows = not tested)  HAND FUNCTION: Grip strength: Right: 43 lbs; Left: 45 lbs, Lateral pinch: Right: 15 lbs, Left: 11 lbs, and 3 point pinch: Right: 9 lbs, Left: 8 lbs 11/04/22: Grip strength: Right 54 lbs; Left: 45 lbs, Lateral pinch: Right: 15 lbs, Left: 13 lbs, and 3 point pinch: Right: 12 lbs, Left: 11 lbs COORDINATION: 9 Hole Peg test: Right: 54 sec. With multiple pegs dropped. Left: 32 sec 11/04/22: 9 hole Peg Test: Right: 31 sec; Left: 37 sec   SENSATION: Light touch: WFL Proprioception: WFL  EDEMA: N/A  MUSCLE TONE: Intact  COGNITION: Overall cognitive status: Within functional limits for tasks assessed  VISION: Subjective report:  No vision changes Baseline  vision: Wears glasses for reading only  PERCEPTION: WFL  PRAXIS: WFL  TODAY'S TREATMENT:                                                                                                                              DATE: 11/11/2022  Contrast Bath;  Pt. tolerated contrast heat pack for 3 min. followed by a cold pack for I min. For 3 trials to the right hand for pain, and swelling. Pt. was provided with a HEP with a visual handout.  Manual therapy:    Pt.  tolerated soft tissue massage to the right hand, wrist, volar surface of the thenar, and hypothenar eminences, and the dorsal aspect of the hand, and digits following moist heat. Manual therapy was performed independent of, and in preparation for ROM/there. Ex.    There. Ex.:   Pt. performed intrinsic stretches with the right hand following moist heat. Pt. was able to demonstrate the proper technique, and hand position with cues.     PATIENT EDUCATION: Education details: Right hand stretches, contrast bath. Person educated: Patient Education method: Explanation Education comprehension: verbalized understanding  HOME EXERCISE PROGRAM: Right hand stretches, contrast bath.  GOALS: Goals reviewed with patient? Yes  SHORT TERM GOALS: Target date: 11/12/2022     Pt. Will be independent with HEPs for the RUE hand function Baseline: EVal: No current HEPs; 11/04/22: Pt working with green theraputty and handwriting skills regularly in the home Goal status: Achieved  LONG TERM GOALS: Target date: 12/24/2022    Pt. Will improve MAM-20 sum score by 5 points for improved hand function Baseline: Eval: MAM-20 Sum score: 69/80; 11/04/22: 76 Goal status: Achieved  2.  Pt. will increase FOTO score by 2 points to reflect Pt. perceived improvement with assessment specific ADL/IADL's.  Baseline: Eval: FOTO score: 67; 11/04/22: 77 Goal status: Achieved  3.  Pt. Will be able to write 1 sentence with 75% legibility  in preparation for  written correspondence. Baseline: Eval: Name only 25% legibility; 11/04/22: Pt able to write 1 sentence with 90% legibility. Goal status: Achieved  4.  Pt. Will improve right grip strength by 5# to be able to securely hold items without dropping them Baseline: Eval: Right: 43#, Left: 45#; 11/04/22: Right: 54 #, Left: 45  Goal status: Achieved/ongoing  5.  Pt. Will improve right hand [facility] skills by 3 sec. On the 9-hole peg test to be able to manipulate small objects Baseline: Eval: Right: 54 sec. With dropping multiple pegs, L: 32sec; 11/04/22: Right: 31 sec (1 dropped peg), L 37 sec Goal status: Achieved/ongoing  6.  Pt. Will be independently, and efficiently using her right hand to use utensils to cut, and eat her food Baseline:  Eval: Pt. Presents with difficulty using her right hand to use utensils; 11/04/22: improving; pt reports still some difficulty cutting meat Goal status: INITIAL  7.  Pt will improve RUE [facility] skills to improve efficiency/thoroughness with brushing teeth.  Baseline: 11/04/22: Pt reports still some reduced control using R hand to brush teeth.  Goal status: New   8.  Pt will increase handwriting legibility to be able to fill out checks and medical forms independently.  Baseline: 11/04/22: Son fills out checks; decreased legibility when confined to writing with short line height/small spaces.  Goal status: New   ASSESSMENT: CLINICAL IMPRESSION:  Pt. reports waking up with right hand pain this morning. Pt. presented with increased pain at the beginning of  the session, and reports decreased pain following treatment. Pt. Required cues for  the proper technique with the hand exercises, and intrinsic stretches. Pt. continues to benefit from OT services for ADL training, A/E training, there. Ex., neuromuscular re-education, and Pt. education in order to improve RUE functioning, and increase engagement in ADL/IADL tasks, and maximize overall independence.   PERFORMANCE  DEFICITS: in functional skills including ADLs, IADLs, coordination, dexterity, and strength.   IMPAIRMENTS: are limiting patient from ADLs, IADLs, leisure, and social participation.   CO-MORBIDITIES: may have co-morbidities  that affects occupational performance. Patient will benefit from skilled OT to address  above impairments and improve overall function.  MODIFICATION OR ASSISTANCE TO COMPLETE EVALUATION: Min-Moderate modification of tasks or assist with assess necessary to complete an evaluation.  OT OCCUPATIONAL PROFILE AND HISTORY: Detailed assessment: Review of records and additional review of physical, cognitive, psychosocial history related to current functional performance.  CLINICAL DECISION MAKING: Moderate - several treatment options, min-mod task modification necessary  REHAB POTENTIAL: Good  EVALUATION COMPLEXITY: Moderate    PLAN:  OT FREQUENCY: 2x/week  OT DURATION: 12 weeks  PLANNED INTERVENTIONS: self care/ADL training, therapeutic exercise, therapeutic activity, neuromuscular re-education, manual therapy, functional mobility training, paraffin, moist heat, contrast bath, and patient/family education  RECOMMENDED OTHER SERVICES: PT  CONSULTED AND AGREED WITH PLAN OF CARE: Patient  PLAN FOR NEXT SESSION: see above  Olegario Messier, MS, OTR/L   11/11/2022, 9:17 AM

## 2022-11-13 ENCOUNTER — Encounter: Payer: Self-pay | Admitting: Nurse Practitioner

## 2022-11-13 ENCOUNTER — Inpatient Hospital Stay: Payer: Medicare HMO | Admitting: Nurse Practitioner

## 2022-11-13 VITALS — BP 127/69 | HR 70 | Temp 97.5°F | Wt 161.5 lb

## 2022-11-13 DIAGNOSIS — D472 Monoclonal gammopathy: Secondary | ICD-10-CM | POA: Diagnosis not present

## 2022-11-13 DIAGNOSIS — D649 Anemia, unspecified: Secondary | ICD-10-CM | POA: Diagnosis not present

## 2022-11-13 DIAGNOSIS — D573 Sickle-cell trait: Secondary | ICD-10-CM | POA: Diagnosis not present

## 2022-11-13 NOTE — Progress Notes (Signed)
[facility] CONSULT NOTE  Patient Care Team: Barbette Reichmann, MD as PCP - General (Internal Medicine)  CHIEF COMPLAINTS/PURPOSE OF CONSULTATION: ANEMIA  HEMATOLOGY HISTORY  # ANEMIA [Hb; MCV-platelets- WBC; Iron sat; ferritin;  GFR- CT/US; Colonoscopy 2021 [[facility]]-; No EGD-  Hemoglobin 10.3 Low    10.8 Low    10.9 Low    11.6 Low    11.0 Low    10.2 Low         HISTORY OF PRESENTING ILLNESS: Ambulating with a cane/left-sided stroke.  Susan Farrell 77 y.o.  female pleasant patient chronic anemia is here today to review the labs/workup. She continues to feel at baseline and endorses generalized fatigue but not worsening or changed. No blood in stool or black stools. Denies dizziness. No shortness of breath. She feels at baseline and denies complaints.     Review of Systems  Constitutional:  Negative for chills, diaphoresis, fever, malaise/fatigue and weight loss.  HENT:  Negative for nosebleeds and sore throat.   Eyes:  Negative for double vision.  Respiratory:  Negative for cough, hemoptysis, sputum production, shortness of breath and wheezing.   Cardiovascular:  Negative for chest pain, palpitations, orthopnea and leg swelling.  Gastrointestinal:  Negative for abdominal pain, blood in stool, constipation, diarrhea, heartburn, melena, nausea and vomiting.  Genitourinary:  Negative for dysuria, frequency and urgency.  Musculoskeletal:  Negative for back pain and joint pain.  Skin: Negative.  Negative for itching and rash.  Neurological:  Negative for dizziness, tingling, focal weakness, weakness and headaches.  Endo/Heme/Allergies:  Does not bruise/bleed easily.  Psychiatric/Behavioral:  Negative for depression. The patient is not nervous/anxious and does not have insomnia.     MEDICAL HISTORY:  Past Medical History:  Diagnosis Date   Hypertension    Stroke [facility])     SURGICAL HISTORY: Past Surgical History:  Procedure Laterality Date   ABDOMINAL  HYSTERECTOMY     CHOLECYSTECTOMY     ENDOVASCULAR REPAIR/STENT GRAFT N/A 06/12/2020   Procedure: ENDOVASCULAR REPAIR/STENT GRAFT;  Surgeon: Renford Dills, MD;  Location: [facility] INVASIVE CV LAB;  Service: Cardiovascular;  Laterality: N/A;    SOCIAL HISTORY: Social History   Socioeconomic History   Marital status: Married    Spouse name: Not on file   Number of children: Not on file   Years of education: Not on file   Highest education level: Not on file  Occupational History   Not on file  Tobacco Use   Smoking status: Every Day    Types: Cigarettes   Smokeless tobacco: Current  Vaping Use   Vaping status: Never Used  Substance and Sexual Activity   Alcohol use: Not Currently   Drug use: Never   Sexual activity: Yes  Other Topics Concern   Not on file  Social History Narrative   Not on file   Social Determinants of Health   Financial Resource Strain: Low Risk  (10/23/2022)   [facility]   Overall Financial Resource Strain (CARDIA)    Difficulty of Paying Living Expenses: Not hard at all  Food Insecurity: No Food Insecurity (10/23/2022)   Received from [facility]   Hunger Vital Sign    Worried About Running Out of Food in the Last Year: Never true    Ran Out of Food in the Last Year: Never true  Transportation Needs: No Transportation Needs (10/23/2022)   Received from [facility]   [facility] - Transportation  In the past 12 months, has lack of transportation kept you from medical appointments or from getting medications?: No    Lack of Transportation (Non-Medical): No  Physical Activity: Insufficiently Active (05/12/2019)   Received from [facility], [facility]   Exercise Vital Sign    Days of Exercise per Week: 2 days    Minutes of Exercise per Session: 30 min  Stress: No Stress Concern Present (04/21/2022)   [name] of Occupational Health -  Occupational Stress Questionnaire    Feeling of Stress : Not at all  Social Connections: Socially Integrated (05/12/2019)   Received from [facility], [facility]   Social Connection and Isolation Panel [NHANES]    Frequency of Communication with Friends and Family: More than three times a week    Frequency of Social Gatherings with Friends and Family: More than three times a week    Attends Religious Services: More than 4 times per year    Active Member of [employer] or Organizations: Yes    Attends Engineer, structural: More than 4 times per year    Marital Status: Married  Catering manager Violence: Not At Risk (09/09/2022)   Humiliation, Afraid, Rape, and Kick questionnaire    Fear of Current or Ex-Partner: No    Emotionally Abused: No    Physically Abused: No    Sexually Abused: No    FAMILY HISTORY: Family History  Problem Relation Age of Onset   Hypertension Mother    Hypertension Father    Diabetes Sister    Breast cancer Neg Hx     ALLERGIES:  has No Known Allergies.  MEDICATIONS:  Current Outpatient Medications  Medication Sig Dispense Refill   acetaminophen (TYLENOL) 325 MG tablet Take 2 tablets (650 mg total) by mouth every 4 (four) hours as needed for mild pain (or temp > 37.5 C (99.5 F)).     amLODipine (NORVASC) 2.5 MG tablet Take 1 tablet (2.5 mg total) by mouth daily. 30 tablet 0   amoxicillin (AMOXIL) 875 MG tablet Take 875 mg by mouth 2 (two) times daily.     aspirin EC 81 MG tablet Take 1 tablet (81 mg total) by mouth daily. Swallow whole. 30 tablet    Calcium Carbonate-Vitamin D 600-200 MG-UNIT TABS Take 1 tablet by mouth daily. 30 tablet 0   ciprofloxacin (CIPRO) 500 MG tablet Take 500 mg by mouth 2 (two) times daily.     clobetasol cream (TEMOVATE) 0.05 % Apply topically 2 (two) times daily as needed.     clopidogrel (PLAVIX) 75 MG tablet Take 1 tablet (75 mg total) by mouth daily. 30 tablet 0   docusate sodium  (COLACE) 100 MG capsule Take 1 capsule (100 mg total) by mouth daily.     fluticasone (FLONASE) 50 MCG/ACT nasal spray Place 1 spray into both nostrils daily.     iron polysaccharides (FERREX 150) 150 MG capsule Take 1 capsule (150 mg total) by mouth daily. 30 capsule 0   metoprolol succinate (TOPROL-XL) 50 MG 24 hr tablet Take 1 tablet (50 mg total) by mouth daily. 30 tablet 0   Multiple Vitamins-Minerals (WOMENS MULTIVITAMIN + COLLAGEN PO) Take 1 tablet by mouth daily.     SYNTHROID 100 MCG tablet Take 1 tablet (100 mcg total) by mouth daily. 30 tablet 0   varenicline (CHANTIX) 0.5 MG tablet Take 1 tablet (0.5 mg total) by mouth 2 (two) times daily. 60 tablet 0   rosuvastatin (  CRESTOR) 40 MG tablet Take 1 tablet (40 mg total) by mouth daily. 30 tablet 0   No current facility-administered medications for this visit.    PHYSICAL EXAMINATION: Vitals:   11/13/22 0955  BP: 127/69  Pulse: 70  Temp: (!) 97.5 F (36.4 C)  SpO2: 100%   Filed Weights   11/13/22 0955  Weight: 161 lb 8 oz (73.3 kg)   Physical Exam Vitals reviewed.  HENT:     Head: Normocephalic and atraumatic.  Cardiovascular:     Rate and Rhythm: Normal rate and regular rhythm.  Pulmonary:     Comments: Decreased breath sounds bilaterally.  Abdominal:     General: There is no distension.     Palpations: Abdomen is soft.  Musculoskeletal:        General: Normal range of motion.  Skin:    General: Skin is warm.     Coloration: Skin is not pale.  Neurological:     Mental Status: She is alert and oriented to person, place, and time.  Psychiatric:        Mood and Affect: Mood normal.        Behavior: Behavior normal.     LABORATORY DATA:  I have reviewed the data as listed Lab Results  Component Value Date   WBC 3.9 (L) 10/30/2022   HGB 10.1 (L) 10/30/2022   HCT 32.1 (L) 10/30/2022   MCV 75.5 (L) 10/30/2022   PLT 171 10/30/2022   Recent Labs    04/21/22 1223 09/08/22 2102 09/10/22 0508 09/15/22 0706  10/30/22 0859  NA 138   < > 138 137 139  K 3.7   < > 3.4* 4.3 3.8  CL 105   < > 106 105 106  CO2 27   < > 24 24 25   GLUCOSE 68*   < > 85 118* 108*  BUN 15   < > 10 9 13   CREATININE 0.76   < > 0.82 0.76 0.81  CALCIUM 9.3   < > 8.4* 9.0 9.5  GFRNONAA >60   < > >60 >60 >60  PROT 7.9  --   --  6.9 7.9  ALBUMIN 4.0  --   --  3.0* 3.8  AST 26  --   --  26 23  ALT 14  --   --  15 13  ALKPHOS 64  --   --  59 61  BILITOT 0.3  --   --  0.5 0.3   < > = values in this interval not displayed.   No results found.  ASSESSMENT & PLAN:   # Chronic intermittent anemia/ Hx of sickle cell trait [Hb electrophoresis/sickle cell]- moderate anemia; hemoglobin 10-11; intermittent microcytosis 76 MCV. March 2024- ferritin- 241.  B12 folic acid normal normal renal function. Low clinical concern for any malignant causes- likely related to thalassemia trait/sickle cell trait. Hemoglobin today is stable at 10.1.    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 Repeat SPEP and K/L light chains on 10/30/22. M spike is absent, IgA elevated at 693. Kappa and lambda free light chains are elevated along with ratio at 2.03 but not rising. Stable. Again reviewed small risk of progression to multiple myeloma and monitoring. Clinically asymptomatic. She does have anemia but I suspect this is related to above and less likely MM. No renal dysfunction or hypercalcemia. Monitor. Follow up in 6 months.    # Left sided stroke- stable.    # DISPOSITION: 6 months-- labs (cbc,  cmp, MM panel, K/L light chains) 2 weeks later- see Dr Donneta Romberg (appt can be virtual if patient prefers)- [place]  All questions were answered. The patient knows to call the clinic with any problems, questions or concerns.  Alinda Dooms, NP 11/13/2022

## 2022-11-14 ENCOUNTER — Ambulatory Visit: Payer: Medicare HMO

## 2022-11-14 DIAGNOSIS — R269 Unspecified abnormalities of gait and mobility: Secondary | ICD-10-CM | POA: Diagnosis not present

## 2022-11-14 DIAGNOSIS — M6281 Muscle weakness (generalized): Secondary | ICD-10-CM | POA: Diagnosis not present

## 2022-11-14 DIAGNOSIS — I63549 Cerebral infarction due to unspecified occlusion or stenosis of unspecified cerebellar artery: Secondary | ICD-10-CM | POA: Diagnosis not present

## 2022-11-14 DIAGNOSIS — I6381 Other cerebral infarction due to occlusion or stenosis of small artery: Secondary | ICD-10-CM | POA: Diagnosis not present

## 2022-11-14 DIAGNOSIS — R278 Other lack of coordination: Secondary | ICD-10-CM | POA: Diagnosis not present

## 2022-11-14 DIAGNOSIS — I639 Cerebral infarction, unspecified: Secondary | ICD-10-CM | POA: Diagnosis not present

## 2022-11-14 DIAGNOSIS — R262 Difficulty in walking, not elsewhere classified: Secondary | ICD-10-CM | POA: Diagnosis not present

## 2022-11-14 DIAGNOSIS — R2689 Other abnormalities of gait and mobility: Secondary | ICD-10-CM | POA: Diagnosis not present

## 2022-11-16 NOTE — Therapy (Signed)
OUTPATIENT OCCUPATIONAL THERAPY NEURO TREATMENT NOTE   Patient Name: Susan Farrell MRN: [id number] DOB:Dec 19, 1945, 77 y.o., female Today's Date: 11/16/2022  PCP: Dr. Marcello Fennel REFERRING PROVIDER: Mariam Dollar, [place]  END OF SESSION:  OT End of Session - 11/14/22      Visit Number 13    Number of Visits 24     Date for OT Re-Evaluation 12/24/22     OT Start Time 0928    OT Stop Time 1013    OT Time Calculation (min) 45 min     Activity Tolerance Patient tolerated treatment well     Behavior During Therapy [facility] for tasks assessed/performed     Past Medical History:  Diagnosis Date   Hypertension    Stroke [facility])    Past Surgical History:  Procedure Laterality Date   ABDOMINAL HYSTERECTOMY     CHOLECYSTECTOMY     ENDOVASCULAR REPAIR/STENT GRAFT N/A 06/12/2020   Procedure: ENDOVASCULAR REPAIR/STENT GRAFT;  Surgeon: Renford Dills, MD;  Location: [facility] INVASIVE CV LAB;  Service: Cardiovascular;  Laterality: N/A;   Patient Active Problem List   Diagnosis Date Noted   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) 09/12/2022   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) 09/09/2022   History of abdominal aortic aneurysm (AAA) repair 09/09/2022   HTN (hypertension) 09/09/2022   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   Anemia of chronic disease 09/09/2022   Tobacco abuse 09/09/2022   Thyroid nodule 09/09/2022   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) 09/02/2021   Hypertensive urgency 08/29/2021   Dyslipidemia 08/29/2021   Hypothyroidism 08/29/2021   Cerebellar stroke, acute (HCC) 08/28/2021   Ruptured abdominal aortic aneurysm (AAA) (HCC) 06/12/2020   Goiter 09/13/2019   Osteopenia 09/13/2019   ONSET DATE: 09/08/2022  REFERRING DIAG: Cerebellar CVA  THERAPY DIAG:  Muscle weakness (generalized)  Other lack of coordination  Cerebellar stroke, acute (HCC)  Rationale for Evaluation and  Treatment: Rehabilitation  SUBJECTIVE:  SUBJECTIVE STATEMENT: Pt reports doing well today.  Pt reports not using a walker at home anymore, but rather uses her sbqc.  Pt reports no falls. Pt accompanied by: self  PERTINENT HISTORY:  Pt. is a 77 y.o. female who had a Cerbellar CVA, Occlusion of the right cerebellar artery. 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 Pt. PMHx includes: Right Basal Ganglia CVA with residual left sided hemiparesis, Discoid Lupus, Hyperlipidemia, Hypothyroidism, Microcytic Anemia   PRECAUTIONS: None  WEIGHT BEARING RESTRICTIONS: No  PAIN: Pt reports R hand pain has resolved Are you having pain? 0  FALLS: Has patient fallen in last 6 months? No  LIVING ENVIRONMENT: Lives with: lives with their spouse Lives in: House/apartment, one story home Stairs: Yes: External: 1 steps; none Has following equipment at home: Counselling psychologist, Environmental consultant - 2 wheeled, Environmental consultant - 4 wheeled, and bed side commode  PLOF: Independent, driving  PATIENT GOALS: To have her right arm be more steady  OBJECTIVE:   HAND DOMINANCE: Right  ADLs: Transfers/ambulation related to ADLs: Eating: independent with the left hand, has difficulty using dominant right hand 2/2 incoordination Grooming: Independent UB Dressing: Independent, increased time LB Dressing: Independent, increased time Toileting: Independent Bathing: Supervision, uses shower seat Tub Shower transfers: Supervision  IADLs: Shopping: Family-moved back home yesterday 2/2 Husband having COVID when Pt. D/d's from inpatient rehab Light housekeeping: Independent Meal Prep: Has not had a chance to perform meal prep yet. Community mobility: Relies  on family Medication management:  independent Financial management: Husband assist 2/2  difficulty with writing checks. Handwriting: 25% legible  MOBILITY STATUS: Needs Assist: uses a RW  ACTIVITY TOLERANCE: Activity tolerance: Fatigues after greater than 30 min.  Of activity  FUNCTIONAL OUTCOME MEASURES:  FOTO: 67, TR score: 79 FOTO: 11/04/22: 77  MAM-20: Sum score: 69/80:  Items scored 2 indicated as very hard to do:  Cutting nails, cutting meat, writing 3-4 sentences legibly, picking up a 1/2 full water pitcher  Items scored: as 3 indicated as a little hard to do:  Opening a wide mouth jar, using a fork/spoon, dialing/keying in phone numbers  MAM-20: 11/04/22: 76/80  UPPER EXTREMITY ROM:    Active ROM Right Eval WNL Left Eval WNL  Shoulder flexion    Shoulder abduction    Shoulder adduction    Shoulder extension    Shoulder internal rotation    Shoulder external rotation    Elbow flexion    Elbow extension    Wrist flexion    Wrist extension    Wrist ulnar deviation    Wrist radial deviation    Wrist pronation    Wrist supination    (Blank rows = not tested)  UPPER EXTREMITY MMT:     MMT Right eval Left eval  Shoulder flexion 5/5 5/5  Shoulder abduction 5/5 5/5  Shoulder adduction    Shoulder extension    Shoulder internal rotation    Shoulder external rotation    Middle trapezius    Lower trapezius    Elbow flexion 5/5 5/5  Elbow extension 5/5 5/5  Wrist flexion    Wrist extension 5/5 5/5  Wrist ulnar deviation    Wrist radial deviation    Wrist pronation    Wrist supination    (Blank rows = not tested)  HAND FUNCTION: Grip strength: Right: 43 lbs; Left: 45 lbs, Lateral pinch: Right: 15 lbs, Left: 11 lbs, and 3 point pinch: Right: 9 lbs, Left: 8 lbs 11/04/22: Grip strength: Right 54 lbs; Left: 45 lbs, Lateral pinch: Right: 15 lbs, Left: 13 lbs, and 3 point pinch: Right: 12 lbs, Left: 11 lbs COORDINATION: 9 Hole Peg test: Right: 54 sec. With multiple pegs dropped. Left: 32 sec 11/04/22: 9 hole Peg Test: Right: 31 sec; Left: 37 sec   SENSATION: Light touch: WFL Proprioception: WFL  EDEMA: N/A  MUSCLE TONE: Intact  COGNITION: Overall cognitive status: Within functional limits for tasks  assessed  VISION: Subjective report:  No vision changes Baseline vision: Wears glasses for reading only  PERCEPTION: WFL  PRAXIS: WFL  TODAY'S TREATMENT:                                                                                                                              DATE: 11/14/2022 Neuro re-ed: Facilitated R hand FMC/dexterity skills working with small cubed beads.  Pt practiced bead pick up from table top without a non-skid surface, storing up to 5 in palm, and moving  beads 1 by 1 from palm to fingertips in prep to place them into narrow jar.  Transitioned to picking up these beads with narrow head tweezers, and placing into jar.  Jar position was placed to increase GM challenge with pt reaching forward and laterally to place beads into container at shoulder level.    Therapeutic Activity: Participated in functional mobility and dynamic standing balance using sbqc and gait belt to target floor level reaching/fall prevention during ADLs.  Pt practiced picking up balls from floor level, then progressing to small items, including buttons and beads, to simulate picking up dropped pills from floor.  Initial min vc to reach with wide BOS and to position self to reach for items more in midline rather than laterally when able to reduce fall risk.  Encouraged using cane to move items closer to a wall/countertop or chair when able to increase support while reaching.  Pt able to perform safe reaching with min guard-supv.    PATIENT EDUCATION: Education details: Fall prevention strategies when reaching for dropped ADL supplies Person educated: Patient Education method: Explanation Education comprehension: verbalized understanding, demonstrated understanding  HOME EXERCISE PROGRAM: Right hand stretches, contrast bath.  GOALS: Goals reviewed with patient? Yes  SHORT TERM GOALS: Target date: 11/12/2022     Pt. Will be independent with HEPs for the RUE hand function Baseline: EVal: No  current HEPs; 11/04/22: Pt working with green theraputty and handwriting skills regularly in the home Goal status: Achieved  LONG TERM GOALS: Target date: 12/24/2022    Pt. Will improve MAM-20 sum score by 5 points for improved hand function Baseline: Eval: MAM-20 Sum score: 69/80; 11/04/22: 76 Goal status: Achieved  2.  Pt. will increase FOTO score by 2 points to reflect Pt. perceived improvement with assessment specific ADL/IADL's.  Baseline: Eval: FOTO score: 67; 11/04/22: 77 Goal status: Achieved  3.  Pt. Will be able to write 1 sentence with 75% legibility  in preparation for written correspondence. Baseline: Eval: Name only 25% legibility; 11/04/22: Pt able to write 1 sentence with 90% legibility. Goal status: Achieved  4.  Pt. Will improve right grip strength by 5# to be able to securely hold items without dropping them Baseline: Eval: Right: 43#, Left: 45#; 11/04/22: Right: 54 #, Left: 45  Goal status: Achieved/ongoing  5.  Pt. Will improve right hand [facility] skills by 3 sec. On the 9-hole peg test to be able to manipulate small objects Baseline: Eval: Right: 54 sec. With dropping multiple pegs, L: 32sec; 11/04/22: Right: 31 sec (1 dropped peg), L 37 sec Goal status: Achieved/ongoing  6.  Pt. Will be independently, and efficiently using her right hand to use utensils to cut, and eat her food Baseline:  Eval: Pt. Presents with difficulty using her right hand to use utensils; 11/04/22: improving; pt reports still some difficulty cutting meat Goal status: INITIAL  7.  Pt will improve RUE GMC skills to improve efficiency/thoroughness with brushing teeth.  Baseline: 11/04/22: Pt reports still some reduced control using R hand to brush teeth.  Goal status: New   8.  Pt will increase handwriting legibility to be able to fill out checks and medical forms independently.  Baseline: 11/04/22: Son fills out checks; decreased legibility when confined to writing with short line height/small  spaces.  Goal status: New   ASSESSMENT: CLINICAL IMPRESSION: Pt reports R hand pain has resolved.  Pt demonstrated good ability to pick up small items from table top without a non-skid surface with and without  tweezers, and is demonstrating improved control of gross motor movements when reaching at targets with the RUE.  Pt verbalized occasional coordination problems when trying to pick up a dropped pill from the floor.  Pt participated in simulated pill pick up using sbqc, as this is the walking aid that pt uses in the home.  Pt demonstrated good safety after initial review of fall prevention strategies, to pick up dropped "pills" (beads/buttons) from floor, reaching with min guard-close supv and no LOB.  Pt reports that her handwriting has greatly improved, and agrees that she is nearing readiness for OT d/c, wishing to continue to focus on increasing accuracy with RUE FMC/GMC skills with remaining visits.  Pt. continues to benefit from OT services for ADL training, A/E training, there. Ex., neuromuscular re-education, and Pt. education in order to improve RUE functioning, and increase engagement in ADL/IADL tasks, and maximize overall independence.   PERFORMANCE DEFICITS: in functional skills including ADLs, IADLs, coordination, dexterity, and strength.   IMPAIRMENTS: are limiting patient from ADLs, IADLs, leisure, and social participation.   CO-MORBIDITIES: may have co-morbidities  that affects occupational performance. Patient will benefit from skilled OT to address above impairments and improve overall function.  MODIFICATION OR ASSISTANCE TO COMPLETE EVALUATION: Min-Moderate modification of tasks or assist with assess necessary to complete an evaluation.  OT OCCUPATIONAL PROFILE AND HISTORY: Detailed assessment: Review of records and additional review of physical, cognitive, psychosocial history related to current functional performance.  CLINICAL DECISION MAKING: Moderate - several  treatment options, min-mod task modification necessary  REHAB POTENTIAL: Good  EVALUATION COMPLEXITY: Moderate    PLAN:  OT FREQUENCY: 2x/week  OT DURATION: 12 weeks  PLANNED INTERVENTIONS: self care/ADL training, therapeutic exercise, therapeutic activity, neuromuscular re-education, manual therapy, functional mobility training, paraffin, moist heat, contrast bath, and patient/family education  RECOMMENDED OTHER SERVICES: PT  CONSULTED AND AGREED WITH PLAN OF CARE: Patient  PLAN FOR NEXT SESSION: see above  Danelle Earthly, MS, OTR/L  11/16/2022, 12:53 PM

## 2022-11-18 ENCOUNTER — Ambulatory Visit: Payer: Medicare HMO | Admitting: Physical Therapy

## 2022-11-18 ENCOUNTER — Encounter: Payer: Self-pay | Admitting: Physical Therapy

## 2022-11-18 ENCOUNTER — Ambulatory Visit: Payer: Medicare HMO

## 2022-11-18 DIAGNOSIS — R269 Unspecified abnormalities of gait and mobility: Secondary | ICD-10-CM

## 2022-11-18 DIAGNOSIS — I639 Cerebral infarction, unspecified: Secondary | ICD-10-CM

## 2022-11-18 DIAGNOSIS — I63549 Cerebral infarction due to unspecified occlusion or stenosis of unspecified cerebellar artery: Secondary | ICD-10-CM

## 2022-11-18 DIAGNOSIS — R278 Other lack of coordination: Secondary | ICD-10-CM

## 2022-11-18 DIAGNOSIS — M6281 Muscle weakness (generalized): Secondary | ICD-10-CM

## 2022-11-18 DIAGNOSIS — R2689 Other abnormalities of gait and mobility: Secondary | ICD-10-CM | POA: Diagnosis not present

## 2022-11-18 DIAGNOSIS — I6381 Other cerebral infarction due to occlusion or stenosis of small artery: Secondary | ICD-10-CM

## 2022-11-18 DIAGNOSIS — R262 Difficulty in walking, not elsewhere classified: Secondary | ICD-10-CM | POA: Diagnosis not present

## 2022-11-18 NOTE — Therapy (Unsigned)
OUTPATIENT OCCUPATIONAL THERAPY NEURO TREATMENT NOTE   Patient Name: Susan Farrell MRN: [id number] DOB:05-26-1945, 77 y.o., female Today's Date: 11/18/2022  PCP: Dr. Marcello Fennel REFERRING PROVIDER: Mariam Dollar, [place]  END OF SESSION:  OT End of Session - 11/18/22 0851     Visit Number 14    Number of Visits 24    Date for OT Re-Evaluation 12/24/22    Authorization Time Period Reporting period starting 11/04/22    Progress Note Due on Visit 10    OT Start Time 0845    OT Stop Time 0930    OT Time Calculation (min) 45 min    Equipment Utilized During Treatment transport chair, RW    Activity Tolerance Patient tolerated treatment well    Behavior During Therapy [facility] for tasks assessed/performed            OT End of Session - 11/14/22      Visit Number 13    Number of Visits 24     Date for OT Re-Evaluation 12/24/22     OT Start Time 0928    OT Stop Time 1013    OT Time Calculation (min) 45 min     Activity Tolerance Patient tolerated treatment well     Behavior During Therapy [facility] for tasks assessed/performed     Past Medical History:  Diagnosis Date   Hypertension    Stroke [facility])    Past Surgical History:  Procedure Laterality Date   ABDOMINAL HYSTERECTOMY     CHOLECYSTECTOMY     ENDOVASCULAR REPAIR/STENT GRAFT N/A 06/12/2020   Procedure: ENDOVASCULAR REPAIR/STENT GRAFT;  Surgeon: Renford Dills, MD;  Location: [facility] INVASIVE CV LAB;  Service: Cardiovascular;  Laterality: N/A;   Patient Active Problem List   Diagnosis Date Noted   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) 09/12/2022   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) 09/09/2022   History of abdominal aortic aneurysm (AAA) repair 09/09/2022   HTN (hypertension) 09/09/2022   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   Anemia of chronic disease 09/09/2022   Tobacco abuse 09/09/2022   Thyroid nodule 09/09/2022   Cerebrovascular accident (CVA) of  right basal ganglia (HCC) 09/02/2021   Hypertensive urgency 08/29/2021   Dyslipidemia 08/29/2021   Hypothyroidism 08/29/2021   Cerebellar stroke, acute (HCC) 08/28/2021   Ruptured abdominal aortic aneurysm (AAA) (HCC) 06/12/2020   Goiter 09/13/2019   Osteopenia 09/13/2019   ONSET DATE: 09/08/2022  REFERRING DIAG: Cerebellar CVA  THERAPY DIAG:  Muscle weakness (generalized)  Other lack of coordination  Cerebellar stroke, acute (HCC)  Rationale for Evaluation and Treatment: Rehabilitation  SUBJECTIVE:  SUBJECTIVE STATEMENT: Pt reports doing well today.  Pt reports not using a walker at home anymore, but rather uses her sbqc.  Pt reports no falls. Pt accompanied by: self  PERTINENT HISTORY:  Pt. is a 77 y.o. female who had a Cerbellar CVA, Occlusion of the right cerebellar artery. 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 Pt. PMHx includes: Right Basal Ganglia CVA with residual left sided hemiparesis, Discoid Lupus, Hyperlipidemia, Hypothyroidism, Microcytic Anemia   PRECAUTIONS: None  WEIGHT BEARING RESTRICTIONS: No  PAIN: Pt reports R hand pain has resolved Are you having pain? 0  FALLS: Has patient fallen in last 6 months? No  LIVING ENVIRONMENT: Lives with: lives with their spouse Lives in: House/apartment, one story home Stairs: Yes: External: 1 steps; none Has following equipment at home: Counselling psychologist, Environmental consultant - 2 wheeled, Environmental consultant -  4 wheeled, and bed side commode  PLOF: Independent, driving  PATIENT GOALS: To have her right arm be more steady  OBJECTIVE:   HAND DOMINANCE: Right  ADLs: Transfers/ambulation related to ADLs: Eating: independent with the left hand, has difficulty using dominant right hand 2/2 incoordination Grooming: Independent UB Dressing: Independent, increased time LB Dressing: Independent, increased time Toileting: Independent Bathing: Supervision, uses shower seat Tub Shower transfers: Supervision  IADLs: Shopping:  Family-moved back home yesterday 2/2 Husband having COVID when Pt. D/d's from inpatient rehab Light housekeeping: Independent Meal Prep: Has not had a chance to perform meal prep yet. Community mobility: Relies  on family Medication management: independent Financial management: Husband assist 2/2  difficulty with writing checks. Handwriting: 25% legible  MOBILITY STATUS: Needs Assist: uses a RW  ACTIVITY TOLERANCE: Activity tolerance: Fatigues after greater than 30 min. Of activity  FUNCTIONAL OUTCOME MEASURES:  FOTO: 67, TR score: 79 FOTO: 11/04/22: 77  MAM-20: Sum score: 69/80:  Items scored 2 indicated as very hard to do:  Cutting nails, cutting meat, writing 3-4 sentences legibly, picking up a 1/2 full water pitcher  Items scored: as 3 indicated as a little hard to do:  Opening a wide mouth jar, using a fork/spoon, dialing/keying in phone numbers  MAM-20: 11/04/22: 76/80  UPPER EXTREMITY ROM:    Active ROM Right Eval WNL Left Eval WNL  Shoulder flexion    Shoulder abduction    Shoulder adduction    Shoulder extension    Shoulder internal rotation    Shoulder external rotation    Elbow flexion    Elbow extension    Wrist flexion    Wrist extension    Wrist ulnar deviation    Wrist radial deviation    Wrist pronation    Wrist supination    (Blank rows = not tested)  UPPER EXTREMITY MMT:     MMT Right eval Left eval  Shoulder flexion 5/5 5/5  Shoulder abduction 5/5 5/5  Shoulder adduction    Shoulder extension    Shoulder internal rotation    Shoulder external rotation    Middle trapezius    Lower trapezius    Elbow flexion 5/5 5/5  Elbow extension 5/5 5/5  Wrist flexion    Wrist extension 5/5 5/5  Wrist ulnar deviation    Wrist radial deviation    Wrist pronation    Wrist supination    (Blank rows = not tested)  HAND FUNCTION: Grip strength: Right: 43 lbs; Left: 45 lbs, Lateral pinch: Right: 15 lbs, Left: 11 lbs, and 3 point pinch: Right:  9 lbs, Left: 8 lbs 11/04/22: Grip strength: Right 54 lbs; Left: 45 lbs, Lateral pinch: Right: 15 lbs, Left: 13 lbs, and 3 point pinch: Right: 12 lbs, Left: 11 lbs 11/18/22: Grip strength: Right: 60 lbs 62 lbs, Lateral pinch: Right: 20 lbs, Left: 11 lbs, and 3 point pinch: Right: 17 lbs, Left: 16 lbs  COORDINATION: 9 Hole Peg test: Right: 54 sec. With multiple pegs dropped. Left: 32 sec 11/04/22: 9 hole Peg Test: Right: 31 sec; Left: 37 sec  11/18/22: 9 hole peg test: Right: 32 sec, Left: 30 sec  SENSATION: Light touch: WFL Proprioception: WFL  EDEMA: N/A  MUSCLE TONE: Intact  COGNITION: Overall cognitive status: Within functional limits for tasks assessed  VISION: Subjective report:  No vision changes Baseline vision: Wears glasses for reading only  PERCEPTION: WFL  PRAXIS: WFL  TODAY'S TREATMENT:  DATE: 11/14/2022 Neuro re-ed: Facilitated R hand FMC/dexterity skills working with small cubed beads.  Pt practiced bead pick up from table top without a non-skid surface, storing up to 5 in palm, and moving beads 1 by 1 from palm to fingertips in prep to place them into narrow jar.  Transitioned to picking up these beads with narrow head tweezers, and placing into jar.  Jar position was placed to increase GM challenge with pt reaching forward and laterally to place beads into container at shoulder level.    Therapeutic Activity: Participated in functional mobility and dynamic standing balance using sbqc and gait belt to target floor level reaching/fall prevention during ADLs.  Pt practiced picking up balls from floor level, then progressing to small items, including buttons and beads, to simulate picking up dropped pills from floor.  Initial min vc to reach with wide BOS and to position self to reach for items more in midline rather than laterally when able to  reduce fall risk.  Encouraged using cane to move items closer to a wall/countertop or chair when able to increase support while reaching.  Pt able to perform safe reaching with min guard-supv.    PATIENT EDUCATION: Education details: Fall prevention strategies when reaching for dropped ADL supplies Person educated: Patient Education method: Explanation Education comprehension: verbalized understanding, demonstrated understanding  HOME EXERCISE PROGRAM: Right hand stretches, contrast bath.  GOALS: Goals reviewed with patient? Yes  SHORT TERM GOALS: Target date: 11/12/2022     Pt. Will be independent with HEPs for the RUE hand function Baseline: EVal: No current HEPs; 11/04/22: Pt working with green theraputty and handwriting skills regularly in the home Goal status: Achieved  LONG TERM GOALS: Target date: 12/24/2022    Pt. Will improve MAM-20 sum score by 5 points for improved hand function Baseline: Eval: MAM-20 Sum score: 69/80; 11/04/22: 76 Goal status: Achieved  2.  Pt. will increase FOTO score by 2 points to reflect Pt. perceived improvement with assessment specific ADL/IADL's.  Baseline: Eval: FOTO score: 67; 11/04/22: 77 Goal status: Achieved  3.  Pt. Will be able to write 1 sentence with 75% legibility  in preparation for written correspondence. Baseline: Eval: Name only 25% legibility; 11/04/22: Pt able to write 1 sentence with 90% legibility. Goal status: Achieved  4.  Pt. Will improve right grip strength by 5# to be able to securely hold items without dropping them Baseline: Eval: Right: 43#, Left: 45#; 11/04/22: Right: 54 #, Left: 45  Goal status: Achieved/ongoing  5.  Pt. Will improve right hand [facility] skills by 3 sec. On the 9-hole peg test to be able to manipulate small objects Baseline: Eval: Right: 54 sec. With dropping multiple pegs, L: 32sec; 11/04/22: Right: 31 sec (1 dropped peg), L 37 sec Goal status: Achieved/ongoing  6.  Pt. Will be independently, and  efficiently using her right hand to use utensils to cut, and eat her food Baseline:  Eval: Pt. Presents with difficulty using her right hand to use utensils; 11/04/22: improving; pt reports still some difficulty cutting meat Goal status: INITIAL  7.  Pt will improve RUE GMC skills to improve efficiency/thoroughness with brushing teeth.  Baseline: 11/04/22: Pt reports still some reduced control using R hand to brush teeth.  Goal status: New   8.  Pt will increase handwriting legibility to be able to fill out checks and medical forms independently.  Baseline: 11/04/22: Son fills out checks; decreased legibility when confined to writing with short line height/small spaces.  Goal status: New   ASSESSMENT: CLINICAL IMPRESSION: Pt reports R hand pain has resolved.  Pt demonstrated good ability to pick up small items from table top without a non-skid surface with and without tweezers, and is demonstrating improved control of gross motor movements when reaching at targets with the RUE.  Pt verbalized occasional coordination problems when trying to pick up a dropped pill from the floor.  Pt participated in simulated pill pick up using sbqc, as this is the walking aid that pt uses in the home.  Pt demonstrated good safety after initial review of fall prevention strategies, to pick up dropped "pills" (beads/buttons) from floor, reaching with min guard-close supv and no LOB.  Pt reports that her handwriting has greatly improved, and agrees that she is nearing readiness for OT d/c, wishing to continue to focus on increasing accuracy with RUE FMC/GMC skills with remaining visits.  Pt. continues to benefit from OT services for ADL training, A/E training, there. Ex., neuromuscular re-education, and Pt. education in order to improve RUE functioning, and increase engagement in ADL/IADL tasks, and maximize overall independence.   PERFORMANCE DEFICITS: in functional skills including ADLs, IADLs, coordination,  dexterity, and strength.   IMPAIRMENTS: are limiting patient from ADLs, IADLs, leisure, and social participation.   CO-MORBIDITIES: may have co-morbidities  that affects occupational performance. Patient will benefit from skilled OT to address above impairments and improve overall function.  MODIFICATION OR ASSISTANCE TO COMPLETE EVALUATION: Min-Moderate modification of tasks or assist with assess necessary to complete an evaluation.  OT OCCUPATIONAL PROFILE AND HISTORY: Detailed assessment: Review of records and additional review of physical, cognitive, psychosocial history related to current functional performance.  CLINICAL DECISION MAKING: Moderate - several treatment options, min-mod task modification necessary  REHAB POTENTIAL: Good  EVALUATION COMPLEXITY: Moderate    PLAN:  OT FREQUENCY: 2x/week  OT DURATION: 12 weeks  PLANNED INTERVENTIONS: self care/ADL training, therapeutic exercise, therapeutic activity, neuromuscular re-education, manual therapy, functional mobility training, paraffin, moist heat, contrast bath, and patient/family education  RECOMMENDED OTHER SERVICES: PT  CONSULTED AND AGREED WITH PLAN OF CARE: Patient  PLAN FOR NEXT SESSION: see above  Danelle Earthly, MS, OTR/L  11/18/2022, 8:52 AM

## 2022-11-18 NOTE — Therapy (Signed)
OUTPATIENT PHYSICAL THERAPY TREATMENT  Patient Name: Susan Farrell MRN: [id number] DOB:30-Nov-1945, 77 y.o., female Today's Date: 11/18/2022   PCP: Barbette Reichmann, MD REFERRING PROVIDER: Charlton Amor, PA-C Purpose for referral, evaluation, treatment: Rehabilitation.   PT End of Session - 11/18/22 0802     Visit Number 11    Number of Visits 16    Date for PT Re-Evaluation 11/26/22    Authorization Type [employer]    Authorization Time Period 10/01/22-11/26/22    Progress Note Due on Visit 20    PT Start Time 0803    PT Stop Time 0844    PT Time Calculation (min) 41 min    Equipment Utilized During Treatment Gait belt    Activity Tolerance Patient tolerated treatment well;No increased pain    Behavior During Therapy [facility] for tasks assessed/performed                      Past Medical History:  Diagnosis Date   Hypertension    Stroke [facility])    Past Surgical History:  Procedure Laterality Date   ABDOMINAL HYSTERECTOMY     CHOLECYSTECTOMY     ENDOVASCULAR REPAIR/STENT GRAFT N/A 06/12/2020   Procedure: ENDOVASCULAR REPAIR/STENT GRAFT;  Surgeon: Renford Dills, MD;  Location: [facility] INVASIVE CV LAB;  Service: Cardiovascular;  Laterality: N/A;     ONSET DATE: 08/28/21  REFERRING DIAG: I63.81 (ICD-10-CM) - 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)  THERAPY DIAG:  Muscle weakness (generalized)  Other abnormalities of gait and mobility  Other lack of coordination  Difficulty in walking, not elsewhere classified  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 Pt reported 0/10 on NPS.   PERTINENT HISTORY:   Susan Farrell sustained a cerebelar CVA in August 2024, rapid progression in symptom improved over first week, DC acute to CIR, then home ot son's house for 5 days and finally back ot her house 2 days prior to this eval. Pt has been using RW at home since DC, no rollator use yet.   PAIN:  Are you having pain? No  PRECAUTIONS: Fall  WEIGHT BEARING RESTRICTIONS No  FALLS: Has patient fallen in last 6 months? No   LIVING ENVIRONMENT: Lives with: lives with their family and lives with their partner Lives in: House/apartment Stairs: Yes: External: 1 steps; none Has following equipment at home: Dan Humphreys - 2 wheeled, Environmental consultant - 4 wheeled, bed side commode, and only uses bed side commode at night   PLOF: Independent, Independent with basic ADLs, Independent with household mobility without device, Independent with community mobility without device, Independent with gait, and Leisure: going to church, going to grocery and department stores.   PATIENT GOALS Get back to normal walking and balance without any support.   OBJECTIVE:   FUNCTIONAL TESTs:  5 times sit to stand: 19 sec hands on knees   Last years 6 minute walk test: 234 feet and requires seated rest break due to lower extremity fatigue on the left lower extremity  Last years Berg Balance Scale: 36  PATIENT SURVEYS:  FOTO 42  TODAY'S TREATMENT:  TE Nustep LE Only Level 1 x 3 minutes, Level 2 x 3 minutes  Standing marches in // bars 2.5# AW 1x10 each LE Standing marches in // bars 2.5# AW on RLE, BTB on LLE, 1x10 each LE Seated resisted marches BTB LLE 2x10 STS 2x10  Ambulation 2.5# AW with rolling walker 2 rounds of 170 ft (rest in between rounds)   Standing hip abductions in // bars 2.5# AW 2x10 each LE Ambulation with rolling  walker 95 ft, focus on maintaining foot clearance from AW resisted walking.    PATIENT EDUCATION: Education details: POC Person educated: Patient Education method: Explanation Education comprehension: verbalized understanding   HOME EXERCISE PROGRAM:  Access Code: [email or web address] URL: [email or web address] Date: 10/16/2022 Prepared by: Maureen Ralphs  Exercises - Supine Active Straight Leg Raise  - 1 x daily - 3 x weekly - 3 sets - 10 reps - Supine Hip Abduction AROM  - 1 x daily - 3 x weekly - 3 sets - 10 reps - Sidelying Hip Abduction  - 1 x daily - 3 x weekly - 3 sets - 10 reps - Sidelying Hip Abduction  - 1 x daily - 3 x weekly - 3 sets - 10 reps - Sidelying Hip Extension in Abduction  - 1 x daily - 3 x weekly - 3 sets - 10 reps - Prone Hip Extension  - 1 x daily - 3 x weekly - 3 sets - 10 reps    GOALS:  Goals reviewed with patient? Yes  SHORT TERM GOALS: Target date: 10/29/22  Patient will be independent in updated home exercise program to improve strength/mobility for better functional independence with ADLs. Baseline:  eval: still working on [employer]; 11/11/2022- Patient reports working on current HEP without any questions or issues.  Goal status: MET  LONG TERM GOALS: Target date: 11/26/22 Patient will improve FOTO score by >14 to indicate subjective reduction in difficulty performing ADL/IADL tasks/mobility.  Baseline: eval: 42 Goal status: INITIAL   2.  Patient will demonstrate 5xSTS improvement in time and use of UE support to indicate improved power, strength, motor control, and reduced pain/dizziness.  Baseline eval: 19sec with hands on knees;  11/11/2022=15.84 sec without UE support.  Goal status: PROGRESSING   3.   The pt will improve to >0.49m/s to improve ability to safely participate in typical work and/or leisure  Baseline: eval: 24.83sec c RW; 11/11/2022= 0.52m/s Goal status: PROGRESSING    4.  Patient will improve score objective  balance assessment  by > 6 points in BERG test to indicate reduced falls risk and improved safety at home.   Baseline: 9/17: 25/ 56; 11/11/2022= 43/56 Goal status: Progressing    ASSESSMENT:  Pt responded well to treatment today. Pt reported no pain symptoms throughout today's session. Pt was highly motivated to complete all tasks during today's session and stated that she has been practicing STS and ambulation with RW at home. Patient would benefit from skilled physical therapy to increase stability and mobility for improved quality of life.    OBJECTIVE IMPAIRMENTS Abnormal gait, decreased activity tolerance, decreased balance, decreased coordination, decreased endurance, decreased mobility, difficulty walking, and decreased strength.   ACTIVITY LIMITATIONS lifting, bending, standing, squatting, stairs, dressing, and caring for others  PARTICIPATION LIMITATIONS: meal prep, cleaning, driving, shopping, yard work, and church  PERSONAL FACTORS Age are also affecting patient's functional outcome.   REHAB POTENTIAL: Good  CLINICAL DECISION MAKING: Evolving/moderate complexity  EVALUATION COMPLEXITY: Moderate  PLAN: PT FREQUENCY: 2x/week  PT DURATION: 8 weeks  PLANNED INTERVENTIONS: Therapeutic exercises, Therapeutic activity, Neuromuscular re-education, Balance training, Gait training, Patient/Family education, Self Care, Joint mobilization, Dry Needling, and Manual therapy  PLAN FOR NEXT SESSION:    Continue with progressive LE strengthening, gait/balance training.   8:47 AM, 11/18/22 Norman Herrlich PT  Physical Therapist - [place] [facility]  Outpatient Physical Therapy- Main Campus [phone]

## 2022-11-21 ENCOUNTER — Ambulatory Visit: Payer: Medicare HMO | Attending: Physician Assistant | Admitting: Physical Therapy

## 2022-11-21 ENCOUNTER — Ambulatory Visit: Payer: Medicare HMO

## 2022-11-21 DIAGNOSIS — I63549 Cerebral infarction due to unspecified occlusion or stenosis of unspecified cerebellar artery: Secondary | ICD-10-CM | POA: Insufficient documentation

## 2022-11-21 DIAGNOSIS — R2689 Other abnormalities of gait and mobility: Secondary | ICD-10-CM | POA: Insufficient documentation

## 2022-11-21 DIAGNOSIS — R278 Other lack of coordination: Secondary | ICD-10-CM | POA: Diagnosis not present

## 2022-11-21 DIAGNOSIS — I639 Cerebral infarction, unspecified: Secondary | ICD-10-CM | POA: Insufficient documentation

## 2022-11-21 DIAGNOSIS — R262 Difficulty in walking, not elsewhere classified: Secondary | ICD-10-CM | POA: Diagnosis not present

## 2022-11-21 DIAGNOSIS — R269 Unspecified abnormalities of gait and mobility: Secondary | ICD-10-CM | POA: Insufficient documentation

## 2022-11-21 DIAGNOSIS — M6281 Muscle weakness (generalized): Secondary | ICD-10-CM | POA: Diagnosis not present

## 2022-11-21 NOTE — Therapy (Signed)
OUTPATIENT PHYSICAL THERAPY TREATMENT  Patient Name: Susan Farrell MRN: [id number] DOB:September 20, 1945, 77 y.o., female Today's Date: 11/21/2022   PCP: Barbette Reichmann, MD REFERRING PROVIDER: Charlton Amor, PA-C Purpose for referral, evaluation, treatment: Rehabilitation.   PT End of Session - 11/21/22 0801     Visit Number 12    Number of Visits 16    Date for PT Re-Evaluation 11/26/22    Authorization Type [employer]    Authorization Time Period 10/01/22-11/26/22    Progress Note Due on Visit 20    PT Start Time 0804    PT Stop Time 0845    PT Time Calculation (min) 41 min    Equipment Utilized During Treatment Gait belt    Activity Tolerance Patient tolerated treatment well;No increased pain    Behavior During Therapy [facility] for tasks assessed/performed                      Past Medical History:  Diagnosis Date   Hypertension    Stroke [facility])    Past Surgical History:  Procedure Laterality Date   ABDOMINAL HYSTERECTOMY     CHOLECYSTECTOMY     ENDOVASCULAR REPAIR/STENT GRAFT N/A 06/12/2020   Procedure: ENDOVASCULAR REPAIR/STENT GRAFT;  Surgeon: Renford Dills, MD;  Location: [facility] INVASIVE CV LAB;  Service: Cardiovascular;  Laterality: N/A;     ONSET DATE: 08/28/21  REFERRING DIAG: I63.81 (ICD-10-CM) - 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)  THERAPY DIAG:  Muscle weakness (generalized)  Other lack of coordination  Cerebellar stroke, acute (HCC)  Other abnormalities of gait and mobility  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 No pain. States that she still struggles to perform HEP on back due to weakness on the LLE< but attempts to perform several times throughout week.  Feels she may be ready to d/c net week based on recent progress.   PERTINENT HISTORY:   Donatella sustained a cerebelar CVA in August 2024, rapid progression in symptom improved over first week, DC acute to CIR, then home ot son's house for 5 days and finally back ot her house 2 days prior to this eval. Pt  has been using RW at home since DC, no rollator use yet.   PAIN:  Are you having pain? No  PRECAUTIONS: Fall  WEIGHT BEARING RESTRICTIONS No  FALLS: Has patient fallen in last 6 months? No   LIVING ENVIRONMENT: Lives with: lives with their family and lives with their partner Lives in: House/apartment Stairs: Yes: External: 1 steps; none Has following equipment at home: Dan Humphreys - 2 wheeled, Environmental consultant - 4 wheeled, bed side commode, and only uses bed side commode at night   PLOF: Independent, Independent with basic ADLs, Independent with household mobility without device, Independent with community mobility without device, Independent with gait, and Leisure: going to church, going to grocery and department stores.   PATIENT GOALS Get back to normal walking and balance without any support.   OBJECTIVE:   FUNCTIONAL TESTs:  5 times sit to stand: 19 sec hands on knees   Last years 6 minute walk test: 234 feet and requires seated rest break due to lower extremity fatigue on the left lower extremity  Last years Berg Balance Scale: 36  PATIENT SURVEYS:  FOTO 42  TODAY'S TREATMENT:  TE Nustep BLE and BUE Level 1 x 2.5 minutes, Level 3 x 2,5   PT instructed pt in HEP expansion to include standing therex As listed below.  - Standing March with Counter Support  - 10 reps - Standing Hip Abduction with Counter Support  - 10 reps - Standing Hip Extension with Counter  Support - 10 reps - Seated Knee Flexion Extension AROM   - 10 reps - Side Stepping with Counter Support  - 10 reps - Mini Squat with Counter Support  - 10 reps  Gait with RW x 367ft with RW and AFO in 20 sec.     PATIENT EDUCATION: Education details: POC, importance of HEP to maintain functional progress. Possible d/c at end of certification periord due to meeting LTG.  Person educated: Patient Education method: Explanation Education comprehension: verbalized understanding   HOME EXERCISE PROGRAM: Access Code: [email or web address] URL: [email or web address] Date: 11/21/2022 Prepared by: Grier Rocher  Exercises - Supine Active Straight Leg Raise  - 1 x daily - 3 x weekly - 3 sets - 10 reps - Supine Hip Abduction AROM  - 1 x daily - 3 x weekly - 3 sets - 10 reps - Sidelying Hip Abduction  - 1 x daily - 3 x weekly - 3 sets - 10 reps - Sidelying Hip Abduction  - 1 x daily - 3 x weekly - 3 sets - 10 reps - Sidelying Hip Extension in Abduction  - 1 x daily - 3 x weekly - 3 sets - 10 reps - Prone Hip Extension  - 1 x daily - 3 x weekly - 3 sets - 10 reps - Standing March with Counter Support  - 1 x daily - 4 x weekly - 3 sets - 10 reps - Standing Hip Abduction with Counter Support  - 1 x daily - 4 x weekly - 3 sets - 10 reps - Standing Hip Extension with Counter Support  - 1 x daily - 4 x weekly - 3 sets - 10 reps - Seated Knee Flexion Extension AROM   - 1 x daily - 4 x weekly - 3 sets - 10 reps - Side Stepping with Counter Support  - 1 x daily - 4 x weekly - 3 sets - 10 reps - Mini Squat with Counter Support  - 1 x daily - 4 x weekly -  3 sets - 10 reps    GOALS:  Goals reviewed with patient? Yes  SHORT TERM GOALS: Target date: 10/29/22  Patient will be independent in updated home exercise program to improve strength/mobility for better functional independence with ADLs. Baseline:  eval: still working on [employer]; 11/11/2022- Patient reports working on current HEP without  any questions or issues.  Goal status: MET  LONG TERM GOALS: Target date: 11/26/22 Patient will improve FOTO score by >14 to indicate subjective reduction in difficulty performing ADL/IADL tasks/mobility.  Baseline: eval: 42 Goal status: INITIAL   2.  Patient will demonstrate 5xSTS improvement in time and use of UE support to indicate improved power, strength, motor control, and reduced pain/dizziness.  Baseline eval: 19sec with hands on knees;  11/11/2022=15.84 sec without UE support.  Goal status: PROGRESSING   3.   The pt will improve to >0.72m/s to improve ability to safely participate in typical work and/or leisure  Baseline: eval: 24.83sec c RW; 11/11/2022= 0.35m/s Goal status: PROGRESSING    4.  Patient will improve score objective balance assessment by > 6 points in BERG test to indicate reduced falls risk and improved safety at home.   Baseline: 9/17: 25/ 56; 11/11/2022= 43/56 Goal status: Progressing    ASSESSMENT:  Pt responded well to treatment today. PT expanded HEP to including standing therex. Pt reports that she feels she may be ready to d/c at next PT treatment depending on results from functional assessment. Pt continues to demonstrate decreased HS activation and occasional knee snap back, but reports this is improved over the last few weeks, also demonstrates improved step height compared to last PT session. Patient would benefit from skilled physical therapy to increase stability and mobility for improved quality of life.    OBJECTIVE IMPAIRMENTS Abnormal gait, decreased activity tolerance, decreased balance, decreased coordination, decreased endurance, decreased mobility, difficulty walking, and decreased strength.   ACTIVITY LIMITATIONS lifting, bending, standing, squatting, stairs, dressing, and caring for others  PARTICIPATION LIMITATIONS: meal prep, cleaning, driving, shopping, yard work, and church  PERSONAL FACTORS Age are also affecting patient's  functional outcome.   REHAB POTENTIAL: Good  CLINICAL DECISION MAKING: Evolving/moderate complexity  EVALUATION COMPLEXITY: Moderate  PLAN: PT FREQUENCY: 2x/week  PT DURATION: 8 weeks  PLANNED INTERVENTIONS: Therapeutic exercises, Therapeutic activity, Neuromuscular re-education, Balance training, Gait training, Patient/Family education, Self Care, Joint mobilization, Dry Needling, and Manual therapy  PLAN FOR NEXT SESSION:   Possible D/c assessment.  Continue with progressive LE strengthening, gait/balance training.    8:02 AM, 11/21/22 Golden Pop PT  Physical Therapist - [facility]  Outpatient Physical Therapy- Main Campus [phone]

## 2022-11-25 ENCOUNTER — Ambulatory Visit: Payer: Medicare HMO | Admitting: Occupational Therapy

## 2022-11-25 ENCOUNTER — Ambulatory Visit: Payer: Medicare HMO | Admitting: Physical Therapy

## 2022-11-25 ENCOUNTER — Encounter: Payer: Self-pay | Admitting: Physical Therapy

## 2022-11-25 DIAGNOSIS — I63549 Cerebral infarction due to unspecified occlusion or stenosis of unspecified cerebellar artery: Secondary | ICD-10-CM | POA: Diagnosis not present

## 2022-11-25 DIAGNOSIS — I639 Cerebral infarction, unspecified: Secondary | ICD-10-CM

## 2022-11-25 DIAGNOSIS — R2689 Other abnormalities of gait and mobility: Secondary | ICD-10-CM | POA: Diagnosis not present

## 2022-11-25 DIAGNOSIS — R269 Unspecified abnormalities of gait and mobility: Secondary | ICD-10-CM | POA: Diagnosis not present

## 2022-11-25 DIAGNOSIS — R262 Difficulty in walking, not elsewhere classified: Secondary | ICD-10-CM | POA: Diagnosis not present

## 2022-11-25 DIAGNOSIS — M6281 Muscle weakness (generalized): Secondary | ICD-10-CM

## 2022-11-25 DIAGNOSIS — R278 Other lack of coordination: Secondary | ICD-10-CM | POA: Diagnosis not present

## 2022-11-25 NOTE — Therapy (Signed)
OUTPATIENT PHYSICAL THERAPY TREATMENT/DISCHARGE  Patient Name: Susan Farrell MRN: [id number] DOB:[birth year], 77 y.o., female Today's Date: 11/25/2022   PCP: Barbette Reichmann, MD REFERRING PROVIDER: Charlton Amor, PA-C Purpose for referral, evaluation, treatment: Rehabilitation.   PT End of Session - 11/25/22 0937     Visit Number 13    Number of Visits 16    Date for PT Re-Evaluation 11/26/22    Authorization Type [employer]    Authorization Time Period 10/01/22-11/26/22    Progress Note Due on Visit 20    PT Start Time 0846    PT Stop Time 0928    PT Time Calculation (min) 42 min    Equipment Utilized During Treatment Gait belt    Activity Tolerance Patient tolerated treatment well;No increased pain    Behavior During Therapy [facility] for tasks assessed/performed                       Past Medical History:  Diagnosis Date   Hypertension    Stroke [facility])    Past Surgical History:  Procedure Laterality Date   ABDOMINAL HYSTERECTOMY     CHOLECYSTECTOMY     ENDOVASCULAR REPAIR/STENT GRAFT N/A 06/12/2020   Procedure: ENDOVASCULAR REPAIR/STENT GRAFT;  Surgeon: Renford Dills, MD;  Location: [facility] INVASIVE CV LAB;  Service: Cardiovascular;  Laterality: N/A;     ONSET DATE: 08/28/21  REFERRING DIAG: I63.81 (ICD-10-CM) - 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)  THERAPY DIAG:  Muscle weakness (generalized)  Other abnormalities of gait and mobility  Abnormality of gait and mobility  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)  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 Pt reported no changes since last visit.  Feels she is ready for D/C today if we think she is.   PERTINENT HISTORY:   Susan Farrell sustained a cerebelar CVA in August 2024, rapid progression in symptom improved over first week, DC acute to CIR, then home ot son's house for 5 days and finally back ot her house 2 days prior to this eval. Pt has been using RW at home since DC, no rollator use yet.   PAIN:  Are you having pain? No  PRECAUTIONS: Fall  WEIGHT BEARING RESTRICTIONS No  FALLS: Has patient fallen in last 6 months? No   LIVING ENVIRONMENT: Lives with: lives with their family and lives with their partner Lives in: House/apartment Stairs: Yes: External: 1 steps; none Has following equipment at home: Dan Humphreys - 2 wheeled, Environmental consultant - 4 wheeled, bed side commode, and only uses bed side commode at night   PLOF: Independent, Independent with basic ADLs, Independent with household mobility without device, Independent with community mobility without device, Independent with gait, and Leisure: going to church, going to grocery and department stores.   PATIENT GOALS Get back to normal walking and balance without any support.   OBJECTIVE:   FUNCTIONAL TESTs:  5 times sit to stand: 19 sec hands on knees   Last years 6 minute walk test: 234 feet and requires seated rest break due to lower extremity fatigue on the left lower extremity  Last years Berg Balance Scale: 36  PATIENT SURVEYS:  FOTO 42  TODAY'S TREATMENT:  Physical therapy treatment session today consisted of completing assessment of goals and administration of testing as demonstrated and documented in flow sheet, treatment, and goals section of this note. Addition treatments may be found below.      Molli Posey  [facility] PT Assessment - 11/25/22 0001       Berg Balance Test   Sit to Stand Able to stand without using hands  and stabilize independently    Standing Unsupported Able to stand safely 2 minutes    Sitting with Back Unsupported but Feet Supported on Floor or Stool Able to sit safely and securely 2 minutes    Stand to Sit Sits safely with minimal use of hands    Transfers Able to transfer safely, minor use of hands    Standing Unsupported with Eyes Closed Able to stand 10 seconds safely    Standing Unsupported with Feet Together Able to place feet together independently and stand 1 minute safely    From Standing, Reach Forward with Outstretched Arm Can reach confidently >25 cm (10")    From Standing Position, Pick up Object from Floor Able to pick up shoe safely and easily    From Standing Position, Turn to Look Behind Over each Shoulder Looks behind from both sides and weight shifts well    Turn 360 Degrees Able to turn 360 degrees safely but slowly    Standing Unsupported, Alternately Place Feet on Step/Stool Needs assistance to keep from falling or unable to try    Standing Unsupported, One Foot in Front Able to take small step independently and hold 30 seconds    Standing on One Leg Able to lift leg independently and hold 5-10 seconds   Standing on R, L barely hovering over the floor   Total Score 47            5 minutes of forward and retro ambulation with turns at random intervals with rollator  Split stance 30 seconds ea LE    PATIENT EDUCATION: Education details: POC, importance of HEP to maintain functional progress. Possible d/c at end of certification periord due to meeting LTG.  Person educated: Patient Education method: Explanation Education comprehension: verbalized understanding   HOME EXERCISE PROGRAM: Access Code: [email or web address] URL: [email or web address] Date: 11/25/2022 Prepared by: Thresa Ross  Exercises - Supine Active Straight Leg Raise - 1 x daily - 3 x weekly - 3 sets - 10 reps - Supine Hip Abduction AROM - 1 x daily -  3 x weekly - 3 sets - 10 reps -  Sidelying Hip Abduction - 1 x daily - 3 x weekly - 3 sets - 10 reps - Sidelying Hip Abduction - 1 x daily - 3 x weekly - 3 sets - 10 reps - Sidelying Hip Extension in Abduction - 1 x daily - 3 x weekly - 3 sets - 10 reps - Prone Hip Extension - 1 x daily - 3 x weekly - 3 sets - 10 reps - Standing March with Counter Support - 1 x daily - 4 x weekly - 3 sets - 10 reps - Standing Hip Abduction with Counter Support - 1 x daily - 4 x weekly - 3 sets - 10 reps - Standing Hip Extension with Counter Support - 1 x daily - 4 x weekly - 3 sets - 10 reps - Seated Knee Flexion Extension AROM - 1 x daily - 4 x weekly - 3 sets - 10 reps - Side Stepping with Counter Support - 1 x daily - 4 x weekly - 3 sets - 10 reps - Mini Squat with Counter Support - 1 x daily - 4 x weekly - 3 sets - 10 reps - Wide Tandem Stance with Eyes Open - 1 x daily - 4 x weekly - 3 reps - 30 sec hold     GOALS:  Goals reviewed with patient? Yes  SHORT TERM GOALS: Target date: 10/29/22  Patient will be independent in updated home exercise program to improve strength/mobility for better functional independence with ADLs. Baseline:  eval: still working on [employer]; 11/11/2022- Patient reports working on current HEP without any questions or issues.  Goal status: MET  LONG TERM GOALS: Target date: 11/26/22 Patient will improve FOTO score by >14 to indicate subjective reduction in difficulty performing ADL/IADL tasks/mobility.  Baseline: eval: 42 11/24/2022: 50 Goal status: PROGRESSING   2.  Patient will demonstrate 5xSTS improvement in time and use of UE support to indicate improved power, strength, motor control, and reduced pain/dizziness.  Baseline eval: 19sec with hands on knees;  11/11/2022=15.84 sec without UE support.  11/24/22: 16.06 sec without UE support Goal status: MET   3.   The pt will improve to >0.2m/s to improve ability to safely participate in typical work and/or leisure  Baseline: eval: 24.83sec c RW;  11/11/2022= 0.105m/s 11/24/22: 21.5 sec with RW, 17.2 sec with rollator, 0.58 m/s Goal status: PROGRESSING    4.  Patient will improve score objective balance assessment by > 6 points in BERG test to indicate reduced falls risk and improved safety at home.   Baseline: 9/17: 25/ 56; 11/11/2022= 43/56 11/24/2022: 47/56 Goal status: MET    ASSESSMENT:  Today's session focused on reassessing the pt's goals for discharge. The pt met the goals listed above regarding the 5xSTS and Berg Balance assessment and improved in the goals regarding the FOTO and . Pt was administered an updated HEP in order to continue to progress towards independence with ADLs at home. Pt to be discharged from formal PT this date with HEP with all concerns and questions answered post session.     OBJECTIVE IMPAIRMENTS Abnormal gait, decreased activity tolerance, decreased balance, decreased coordination, decreased endurance, decreased mobility, difficulty walking, and decreased strength.   ACTIVITY LIMITATIONS lifting, bending, standing, squatting, stairs, dressing, and caring for others  PARTICIPATION LIMITATIONS: meal prep, cleaning, driving, shopping, yard work, and church  PERSONAL FACTORS Age are also affecting patient's functional outcome.   REHAB POTENTIAL: Good  CLINICAL DECISION MAKING: Evolving/moderate complexity  EVALUATION COMPLEXITY: Moderate  PLAN: PT FREQUENCY: 2x/week  PT DURATION: 8 weeks  PLANNED INTERVENTIONS: Therapeutic exercises, Therapeutic activity, Neuromuscular re-education, Balance training, Gait training, Patient/Family education, Self Care, Joint mobilization, Dry Needling, and Manual therapy  PLAN FOR NEXT SESSION:   Pt discharged  Debara Pickett, SPT  9:38 AM, 11/25/22 Norman Herrlich PT  Physical Therapist - [facility]  Outpatient Physical Therapy- Main Campus [phone]

## 2022-11-28 ENCOUNTER — Ambulatory Visit: Payer: Medicare HMO

## 2022-11-28 ENCOUNTER — Ambulatory Visit: Payer: Medicare HMO | Admitting: Physical Therapy

## 2022-12-02 ENCOUNTER — Ambulatory Visit: Payer: Medicare HMO | Admitting: Physical Therapy

## 2022-12-02 ENCOUNTER — Ambulatory Visit: Payer: Medicare HMO | Admitting: Occupational Therapy

## 2022-12-05 ENCOUNTER — Ambulatory Visit: Payer: Medicare HMO | Admitting: Physical Therapy

## 2022-12-05 ENCOUNTER — Ambulatory Visit: Payer: Medicare HMO

## 2022-12-09 ENCOUNTER — Ambulatory Visit: Payer: Medicare HMO | Admitting: Occupational Therapy

## 2022-12-09 ENCOUNTER — Ambulatory Visit: Payer: Medicare HMO | Admitting: Physical Therapy

## 2022-12-12 ENCOUNTER — Ambulatory Visit: Payer: Medicare HMO | Admitting: Physical Therapy

## 2022-12-12 ENCOUNTER — Ambulatory Visit: Payer: Medicare HMO

## 2022-12-16 ENCOUNTER — Ambulatory Visit: Payer: Medicare HMO | Admitting: Occupational Therapy

## 2022-12-16 ENCOUNTER — Ambulatory Visit: Payer: Medicare HMO | Admitting: Physical Therapy

## 2022-12-23 ENCOUNTER — Ambulatory Visit: Payer: Medicare HMO | Admitting: Occupational Therapy

## 2022-12-23 ENCOUNTER — Ambulatory Visit: Payer: Medicare HMO | Admitting: Physical Therapy

## 2022-12-25 DIAGNOSIS — H25813 Combined forms of age-related cataract, bilateral: Secondary | ICD-10-CM | POA: Diagnosis not present

## 2022-12-25 DIAGNOSIS — H524 Presbyopia: Secondary | ICD-10-CM | POA: Diagnosis not present

## 2022-12-26 ENCOUNTER — Ambulatory Visit: Payer: Medicare HMO | Admitting: Physical Therapy

## 2022-12-26 ENCOUNTER — Ambulatory Visit: Payer: Medicare HMO

## 2022-12-30 ENCOUNTER — Ambulatory Visit: Payer: Medicare HMO | Admitting: Physical Therapy

## 2022-12-30 ENCOUNTER — Ambulatory Visit: Payer: Medicare HMO | Admitting: Occupational Therapy

## 2023-01-02 ENCOUNTER — Ambulatory Visit: Payer: Medicare HMO | Admitting: Physical Therapy

## 2023-01-02 ENCOUNTER — Ambulatory Visit: Payer: Medicare HMO

## 2023-01-05 ENCOUNTER — Other Ambulatory Visit: Payer: Self-pay | Admitting: Registered Nurse

## 2023-01-06 ENCOUNTER — Ambulatory Visit: Payer: Medicare HMO | Admitting: Physical Therapy

## 2023-01-06 ENCOUNTER — Ambulatory Visit: Payer: Medicare HMO | Admitting: Occupational Therapy

## 2023-01-07 DIAGNOSIS — E890 Postprocedural hypothyroidism: Secondary | ICD-10-CM | POA: Diagnosis not present

## 2023-01-09 ENCOUNTER — Ambulatory Visit: Payer: Medicare HMO

## 2023-01-09 ENCOUNTER — Ambulatory Visit: Payer: Medicare HMO | Admitting: Physical Therapy

## 2023-01-13 ENCOUNTER — Ambulatory Visit: Payer: Medicare HMO

## 2023-01-13 ENCOUNTER — Ambulatory Visit: Payer: Medicare HMO | Admitting: Physical Therapy

## 2023-01-16 ENCOUNTER — Ambulatory Visit: Payer: Medicare HMO | Admitting: Physical Therapy

## 2023-01-20 ENCOUNTER — Ambulatory Visit: Payer: Medicare HMO | Admitting: Physical Therapy

## 2023-01-20 DIAGNOSIS — E041 Nontoxic single thyroid nodule: Secondary | ICD-10-CM | POA: Diagnosis not present

## 2023-01-23 ENCOUNTER — Ambulatory Visit: Payer: Medicare HMO | Admitting: Physical Therapy

## 2023-01-27 ENCOUNTER — Ambulatory Visit: Payer: Medicare HMO | Admitting: Physical Therapy

## 2023-01-30 ENCOUNTER — Ambulatory Visit: Payer: Medicare HMO | Admitting: Physical Therapy

## 2023-02-03 ENCOUNTER — Ambulatory Visit: Payer: Medicare HMO | Admitting: Physical Therapy

## 2023-02-06 ENCOUNTER — Ambulatory Visit: Payer: Medicare HMO | Admitting: Physical Medicine & Rehabilitation

## 2023-02-06 ENCOUNTER — Ambulatory Visit: Payer: Medicare HMO | Admitting: Physical Therapy

## 2023-02-10 ENCOUNTER — Ambulatory Visit: Payer: Medicare HMO | Admitting: Physical Therapy

## 2023-02-13 ENCOUNTER — Encounter: Payer: Self-pay | Admitting: Physical Medicine & Rehabilitation

## 2023-02-13 ENCOUNTER — Ambulatory Visit: Payer: Medicare HMO | Admitting: Physical Therapy

## 2023-02-13 ENCOUNTER — Encounter: Payer: Medicare Other | Attending: Physical Medicine & Rehabilitation | Admitting: Physical Medicine & Rehabilitation

## 2023-02-13 VITALS — BP 135/76 | HR 63 | Ht 66.0 in | Wt 172.0 lb

## 2023-02-13 DIAGNOSIS — I69354 Hemiplegia and hemiparesis following cerebral infarction affecting left non-dominant side: Secondary | ICD-10-CM

## 2023-02-13 DIAGNOSIS — I69393 Ataxia following cerebral infarction: Secondary | ICD-10-CM

## 2023-02-13 NOTE — Patient Instructions (Addendum)
Continue with Home exercise program   May do night time driving, no interstate driving

## 2023-02-13 NOTE — Progress Notes (Signed)
Subjective:     Patient ID: Susan Farrell, female   DOB: 1945/06/23, 78 y.o.   MRN: [id number] Admit date: 09/02/2021 Discharge date: 09/13/2021 78 y.o. right-handed female with history of discoid lupus, hypertension, hyperlipidemia, hypothyroidism, abdominal aortic aneurysm with repair tobacco use.  Per chart review lives with spouse independent prior to admission.  Presented to [facility] 08/28/2021 with acute onset of left-sided weakness.  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  Underlying age-related cerebral atrophy with mild chronic small vessel changes.  CT angiogram head and neck showed internal carotid and vertebral arteries to be patent.  No intracranial large vessel occlusion or stenosis.  Admission chemistries unremarkable except glucose 112 hemoglobin A1c 5.7.  Echocardiogram with ejection fraction of 60 to 65% no wall motion abnormalities grade 1 diastolic dysfunction.  Maintained on low-dose aspirin as well as Plavix x3 weeks then aspirin alone.  Subcutaneous Lovenox for DVT prophylaxis.  She did sustain a fall 08/30/2021 when attempting to get out of bed without assistance and no injury sustained.  Therapy evaluations completed due to patient decreased functional mobility was admitted for a comprehensive rehab program.   Admit date: 09/12/2022 Discharge date: 09/25/2022 The patient had an additional inpatient rehabilitation stay for stroke suffered 09/09/2022 right superior cerebellar infarct HPI  Patient overall is doing okay.  She has no pain complaints.  She is here with her husband. Now driving to church and grocery store  No falls  Mod I ADL  Using 4 point cane and Left AFO to ambulate  Pain Inventory Average Pain 0 Pain Right Now 0 My pain is  NO PAIN  LOCATION OF PAIN  NO PAIN  BOWEL Number of stools per week: 7 Oral laxative use Yes  Type of laxative OTC   BLADDER Normal    Mobility use a cane ability to climb steps?   yes do you drive?  yes Do you have any goals in this area?  yes  Function retired I need assistance with the following:  meal prep, household duties, and shopping Do you have any goals in this area?  yes  Neuro/Psych tingling trouble walking  Prior Studies Any changes since last visit?  no  Physicians involved in your care Any changes since last visit?  no   Family History  Problem Relation Age of Onset   Hypertension Mother    Hypertension Father    Diabetes Sister    Breast cancer Neg Hx    Social History   Socioeconomic History   Marital status: Married    Spouse name: Not on file   Number of children: Not on file   Years of education: Not on file   Highest education level: Not on file  Occupational History   Not on file  Tobacco Use   Smoking status: Every Day    Types: Cigarettes   Smokeless tobacco: Current  Vaping Use   Vaping status: Never Used  Substance and Sexual Activity   Alcohol use: Not Currently   Drug use: Never   Sexual activity: Yes  Other Topics Concern   Not on file  Social History Narrative   Not on file   Social Drivers of Health   Financial Resource Strain: Low Risk  (10/23/2022)   Received from [facility]   Overall Financial Resource Strain (CARDIA)    Difficulty of Paying Living Expenses: Not hard at all  Food Insecurity: No Food Insecurity (10/23/2022)   Received from [facility]   Hunger Vital Sign    Worried About Running Out of Food in the Last Year: Never true    Ran Out of Food in the Last Year: Never true  Transportation Needs: No Transportation Needs (10/23/2022)   Received from [facility] - Transportation    In the past 12 months, has lack of transportation kept you from medical appointments or from getting medications?: No    Lack of Transportation (Non-Medical): No  Physical Activity: Insufficiently Active (05/12/2019)   Received from [facility], [facility]   Exercise Vital Sign    Days of Exercise per Week: 2 days    Minutes of Exercise per Session: 30 min  Stress: No Stress Concern Present (04/21/2022)   [name] of Occupational Health - Occupational Stress Questionnaire    Feeling of Stress : Not at all  Social Connections: Socially Integrated (05/12/2019)   Received from [facility], [facility]   Social Connection and Isolation Panel [NHANES]    Frequency of Communication with Friends and Family: More than three times a week    Frequency of Social Gatherings with Friends and Family: More than three times a week    Attends Religious Services: More than 4 times per year    Active Member of Clubs or Organizations: Yes    Attends Banker Meetings: More than 4 times per year    Marital Status: Married   Past Surgical History:  Procedure Laterality Date   ABDOMINAL HYSTERECTOMY     CHOLECYSTECTOMY     ENDOVASCULAR REPAIR/STENT GRAFT N/A 06/12/2020   Procedure: ENDOVASCULAR REPAIR/STENT GRAFT;  Surgeon: Renford Dills, MD;  Location: [facility] INVASIVE CV LAB;  Service: Cardiovascular;  Laterality: N/A;   Past Medical History:  Diagnosis Date   Hypertension    Stroke (HCC)    Pulse 66   Ht 5\' 6"  (1.676 m)   Wt 172 lb (78 kg)   SpO2 98%   BMI 27.76 kg/m   Opioid Risk Score:   Fall Risk Score:  `1  Depression screen PHQ 2/9     02/13/2023    1:22 PM 10/08/2022   12:44 PM 11/14/2021   10:40 AM 09/25/2021    1:04 PM 09/25/2021    1:01 PM  Depression screen PHQ 2/9  Decreased Interest 0 0 0 0 0  Down, Depressed, Hopeless 0 0 0 0 0  PHQ - 2 Score 0 0 0 0 0  Altered sleeping  0  0   Tired, decreased energy  2  0   Change in appetite  0  0   Feeling bad or failure about yourself   0  0   Trouble concentrating  0  0   Moving slowly or fidgety/restless  1  0   Suicidal thoughts  0  0   PHQ-9 Score  3  0   Difficult doing  work/chores  Somewhat difficult  Not difficult at all     Review of Systems  Musculoskeletal:  Positive for gait problem.  Neurological:        Tingling  All other systems reviewed and are negative.      Objective:   Physical Exam General No acute distress Mood and affect appropriate Speech without dysarthria or aphasia Motor strength is 4+ bilateral deltoid bicep tricep grip  5/5 right hip flexion knee extension ankle dorsiflexion 4/5 left hip flexion knee extension  and trace ankle dorsiflexion. Ambulates with AFO as well as a quad cane.  No evidence of toe drag there is mild knee instability but the calf strap on the AFO was slightly loose.  She is wearing her brace over her jeans. Cerebellar mild to moderate dysmetria right finger-nose-finger, mild dysmetria left finger-nose-finger    Assessment:     1.  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  She has had good recovery in terms of her functional status to her previous baseline. 2.  Right basal ganglia infarct in 2023 with residual left lower extremity weakness requiring AFO. We discussed that she is doing quite well she can resume driving at night. No interstates Follow-up physical medicine rehab on as-needed basis Have emphasized need to do home exercise program on a regular basis    Plan:     See above

## 2023-02-17 ENCOUNTER — Ambulatory Visit: Payer: Medicare HMO | Admitting: Physical Therapy

## 2023-02-17 ENCOUNTER — Other Ambulatory Visit: Payer: Self-pay | Admitting: Internal Medicine

## 2023-02-17 DIAGNOSIS — Z1231 Encounter for screening mammogram for malignant neoplasm of breast: Secondary | ICD-10-CM

## 2023-02-20 ENCOUNTER — Ambulatory Visit: Payer: Medicare HMO | Admitting: Physical Therapy

## 2023-02-24 ENCOUNTER — Ambulatory Visit: Payer: Medicare HMO | Admitting: Physical Therapy

## 2023-02-27 ENCOUNTER — Ambulatory Visit: Payer: Medicare HMO | Admitting: Physical Therapy

## 2023-03-03 ENCOUNTER — Ambulatory Visit: Payer: Medicare HMO | Admitting: Physical Therapy

## 2023-03-06 ENCOUNTER — Ambulatory Visit: Payer: Medicare HMO | Admitting: Physical Therapy

## 2023-04-30 ENCOUNTER — Inpatient Hospital Stay: Payer: Medicare HMO | Attending: Internal Medicine

## 2023-04-30 DIAGNOSIS — Z79899 Other long term (current) drug therapy: Secondary | ICD-10-CM | POA: Insufficient documentation

## 2023-04-30 DIAGNOSIS — I10 Essential (primary) hypertension: Secondary | ICD-10-CM | POA: Insufficient documentation

## 2023-04-30 DIAGNOSIS — Z8673 Personal history of transient ischemic attack (TIA), and cerebral infarction without residual deficits: Secondary | ICD-10-CM | POA: Insufficient documentation

## 2023-04-30 DIAGNOSIS — D649 Anemia, unspecified: Secondary | ICD-10-CM | POA: Diagnosis not present

## 2023-04-30 DIAGNOSIS — D573 Sickle-cell trait: Secondary | ICD-10-CM | POA: Diagnosis not present

## 2023-04-30 DIAGNOSIS — M8580 Other specified disorders of bone density and structure, unspecified site: Secondary | ICD-10-CM | POA: Diagnosis not present

## 2023-04-30 DIAGNOSIS — F17210 Nicotine dependence, cigarettes, uncomplicated: Secondary | ICD-10-CM | POA: Diagnosis not present

## 2023-04-30 DIAGNOSIS — D472 Monoclonal gammopathy: Secondary | ICD-10-CM

## 2023-04-30 LAB — CMP (CANCER CENTER ONLY)
ALT: 13 U/L (ref 0–44)
AST: 22 U/L (ref 15–41)
Albumin: 3.8 g/dL (ref 3.5–5.0)
Alkaline Phosphatase: 78 U/L (ref 38–126)
Anion gap: 6 (ref 5–15)
BUN: 12 mg/dL (ref 8–23)
CO2: 23 mmol/L (ref 22–32)
Calcium: 9 mg/dL (ref 8.9–10.3)
Chloride: 108 mmol/L (ref 98–111)
Creatinine: 0.83 mg/dL (ref 0.44–1.00)
GFR, Estimated: >60 mL/min (ref >60)
Glucose, Bld: 92 mg/dL (ref 70–99)
Potassium: 3.5 mmol/L (ref 3.5–5.1)
Sodium: 137 mmol/L (ref 135–145)
Total Bilirubin: 0.4 mg/dL (ref 0.0–1.2)
Total Protein: 7.5 g/dL (ref 6.5–8.1)

## 2023-04-30 LAB — CBC WITH DIFFERENTIAL (CANCER CENTER ONLY)
Abs Immature Granulocytes: 0.01 10*3/uL (ref 0.00–0.07)
Basophils Absolute: 0 10*3/uL (ref 0.0–0.1)
Basophils Relative: 1 %
Eosinophils Absolute: 0.1 10*3/uL (ref 0.0–0.5)
Eosinophils Relative: 3 %
HCT: 35.1 % — ABNORMAL LOW (ref 36.0–46.0)
Hemoglobin: 11 g/dL — ABNORMAL LOW (ref 12.0–15.0)
Immature Granulocytes: 0 %
Lymphocytes Relative: 28 %
Lymphs Abs: 1.1 10*3/uL (ref 0.7–4.0)
MCH: 23.3 pg — ABNORMAL LOW (ref 26.0–34.0)
MCHC: 31.3 g/dL (ref 30.0–36.0)
MCV: 74.2 fL — ABNORMAL LOW (ref 80.0–100.0)
Monocytes Absolute: 0.3 10*3/uL (ref 0.1–1.0)
Monocytes Relative: 6 %
Neutro Abs: 2.4 10*3/uL (ref 1.7–7.7)
Neutrophils Relative %: 62 %
Platelet Count: 178 10*3/uL (ref 150–400)
RBC: 4.73 MIL/uL (ref 3.87–5.11)
RDW: 16.4 % — ABNORMAL HIGH (ref 11.5–15.5)
WBC Count: 3.9 10*3/uL — ABNORMAL LOW (ref 4.0–10.5)
nRBC: 0 % (ref 0.0–0.2)

## 2023-04-30 LAB — VITAMIN D 25 HYDROXY (VIT D DEFICIENCY, FRACTURES): Vit D, 25-Hydroxy: 38.65 ng/mL (ref 30–100)

## 2023-05-01 LAB — KAPPA/LAMBDA LIGHT CHAINS
Kappa free light chain: 51 mg/L — ABNORMAL HIGH (ref 3.3–19.4)
Kappa, lambda light chain ratio: 1.9 — ABNORMAL HIGH (ref 0.26–1.65)
Lambda free light chains: 26.8 mg/L — ABNORMAL HIGH (ref 5.7–26.3)

## 2023-05-03 LAB — MULTIPLE MYELOMA PANEL, SERUM
Albumin SerPl Elph-Mcnc: 3.5 g/dL (ref 2.9–4.4)
Albumin/Glob SerPl: 1.1 (ref 0.7–1.7)
Alpha 1: 0.3 g/dL (ref 0.0–0.4)
Alpha2 Glob SerPl Elph-Mcnc: 0.7 g/dL (ref 0.4–1.0)
B-Globulin SerPl Elph-Mcnc: 1.3 g/dL (ref 0.7–1.3)
Gamma Glob SerPl Elph-Mcnc: 1.1 g/dL (ref 0.4–1.8)
Globulin, Total: 3.4 g/dL (ref 2.2–3.9)
IgA: 484 mg/dL — ABNORMAL HIGH (ref 64–422)
IgG (Immunoglobin G), Serum: 1387 mg/dL (ref 586–1602)
IgM (Immunoglobulin M), Srm: 131 mg/dL (ref 26–217)
Total Protein ELP: 6.9 g/dL (ref 6.0–8.5)

## 2023-05-14 ENCOUNTER — Inpatient Hospital Stay (HOSPITAL_BASED_OUTPATIENT_CLINIC_OR_DEPARTMENT_OTHER): Payer: Medicare HMO | Admitting: Internal Medicine

## 2023-05-14 ENCOUNTER — Encounter: Payer: Self-pay | Admitting: Internal Medicine

## 2023-05-14 VITALS — BP 126/73 | HR 63 | Temp 97.2°F | Resp 16 | Wt 177.0 lb

## 2023-05-14 DIAGNOSIS — Z79899 Other long term (current) drug therapy: Secondary | ICD-10-CM | POA: Diagnosis not present

## 2023-05-14 DIAGNOSIS — D649 Anemia, unspecified: Secondary | ICD-10-CM | POA: Diagnosis not present

## 2023-05-14 DIAGNOSIS — F17210 Nicotine dependence, cigarettes, uncomplicated: Secondary | ICD-10-CM | POA: Diagnosis not present

## 2023-05-14 DIAGNOSIS — Z8673 Personal history of transient ischemic attack (TIA), and cerebral infarction without residual deficits: Secondary | ICD-10-CM | POA: Diagnosis not present

## 2023-05-14 DIAGNOSIS — M8580 Other specified disorders of bone density and structure, unspecified site: Secondary | ICD-10-CM | POA: Diagnosis not present

## 2023-05-14 DIAGNOSIS — I10 Essential (primary) hypertension: Secondary | ICD-10-CM | POA: Diagnosis not present

## 2023-05-14 DIAGNOSIS — D573 Sickle-cell trait: Secondary | ICD-10-CM | POA: Diagnosis not present

## 2023-05-14 NOTE — Assessment & Plan Note (Addendum)
#   Chronic intermittent anemia/ Hx of sickle cell trait [Hb electrophoresis/sickle cell]- moderate anemia; hemoglobin 10-11; intermittent microcytosis 76 MCV. March 2024- ferritin- 241.  B12 folic acid  normal normal renal function- stable.   # Low clinical concern for any malignant causes.  Suspect mild anemia-sec to thalassemia trait/sickle cell trait. s.  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 discussed regarding the possible diagnosis of MGUS.  I discussed  with the patient regarding natural history of MGUS; extremely small risk of progression to multiple myeloma.  Hold off any bone marrow biopsy.  Will recheck again in 12  months.  # Left sided stroke- stable.   # DISPOSITION: # follow up in  12  months-- MD; 2 weeks prior- labs-cbc/cmp; iron  studies; ferritin; MM panel; K/L light chain ratio -Dr.B.

## 2023-05-14 NOTE — Progress Notes (Signed)
 [facility] CONSULT NOTE  Patient Care Team: Antonio Baumgarten, MD as PCP - General (Internal Medicine) Gwyn Leos, MD as Consulting Physician (Oncology)  CHIEF COMPLAINTS/PURPOSE OF CONSULTATION: ANEMIA   HEMATOLOGY HISTORY  # ANEMIA[Hb; MCV-platelets- WBC; Iron  sat; ferritin;  GFR- CT/US ; Colonoscopy 2021 [[facility]]-; No EGD-   Hemoglobin 10.3 Low    10.8 Low    10.9 Low    11.6 Low    11.0 Low    10.2 Low          HISTORY OF PRESENTING ILLNESS: Ambulating with a cane/left-sided stroke.  Susan Farrell 78 y.o.  female pleasant patient chronic anemia is here today to review the labs/workup.  Patient is doing ok, she is still having some swelling and some pain in her left knee, ever since she had 2 strokes back in August 2024.   No new questions or concerns for the doctor today. Patient denies any worsening shortness of breath or cough.  Denies any nausea vomiting abdominal pain.   Review of Systems  Constitutional:  Negative for chills, diaphoresis, fever, malaise/fatigue and weight loss.  HENT:  Negative for nosebleeds and sore throat.   Eyes:  Negative for double vision.  Respiratory:  Negative for cough, hemoptysis, sputum production, shortness of breath and wheezing.   Cardiovascular:  Negative for chest pain, palpitations, orthopnea and leg swelling.  Gastrointestinal:  Negative for abdominal pain, blood in stool, constipation, diarrhea, heartburn, melena, nausea and vomiting.  Genitourinary:  Negative for dysuria, frequency and urgency.  Musculoskeletal:  Negative for back pain and joint pain.  Skin: Negative.  Negative for itching and rash.  Neurological:  Negative for dizziness, tingling, focal weakness, weakness and headaches.  Endo/Heme/Allergies:  Does not bruise/bleed easily.  Psychiatric/Behavioral:  Negative for depression. The patient is not nervous/anxious and does not have insomnia.     MEDICAL HISTORY:  Past Medical History:   Diagnosis Date   Hypertension    Stroke [facility])     SURGICAL HISTORY: Past Surgical History:  Procedure Laterality Date   ABDOMINAL HYSTERECTOMY     CHOLECYSTECTOMY     ENDOVASCULAR REPAIR/STENT GRAFT N/A 06/12/2020   Procedure: ENDOVASCULAR REPAIR/STENT GRAFT;  Surgeon: Jackquelyn Mass, MD;  Location: [facility] INVASIVE CV LAB;  Service: Cardiovascular;  Laterality: N/A;    SOCIAL HISTORY: Social History   Socioeconomic History   Marital status: Married    Spouse name: Not on file   Number of children: Not on file   Years of education: Not on file   Highest education level: Not on file  Occupational History   Not on file  Tobacco Use   Smoking status: Every Day    Types: Cigarettes   Smokeless tobacco: Current  Vaping Use   Vaping status: Never Used  Substance and Sexual Activity   Alcohol  use: Not Currently   Drug use: Never   Sexual activity: Yes  Other Topics Concern   Not on file  Social History Narrative   Not on file   Social Drivers of Health   Financial Resource Strain: Low Risk  (02/17/2023)   Received from [facility]   Overall Financial Resource Strain (CARDIA)    Difficulty of Paying Living Expenses: Not hard at all  Food Insecurity: No Food Insecurity (02/17/2023)   Received from [facility]   Hunger Vital Sign    Worried About Running Out of Food in the Last Year: Never true    Ran Out of Food  in the Last Year: Never true  Transportation Needs: No Transportation Needs (02/17/2023)   Received from [facility] Transportation    In the past 12 months, has lack of transportation kept you from medical appointments or from getting medications?: No    Lack of Transportation (Non-Medical): No  Physical Activity: Insufficiently Active (05/12/2019)   Received from [facility], [facility]   Exercise Vital Sign    Days of Exercise per Week: 2 days    Minutes  of Exercise per Session: 30 min  Stress: No Stress Concern Present (04/21/2022)   [name] of Occupational Health - Occupational Stress Questionnaire    Feeling of Stress : Not at all  Social Connections: Socially Integrated (05/12/2019)   Received from [facility], [facility]   Social Connection and Isolation Panel [NHANES]    Frequency of Communication with Friends and Family: More than three times a week    Frequency of Social Gatherings with Friends and Family: More than three times a week    Attends Religious Services: More than 4 times per year    Active Member of [employer] or Organizations: Yes    Attends Engineer, structural: More than 4 times per year    Marital Status: Married  Catering manager Violence: Not At Risk (09/09/2022)   Humiliation, Afraid, Rape, and Kick questionnaire    Fear of Current or Ex-Partner: No    Emotionally Abused: No    Physically Abused: No    Sexually Abused: No    FAMILY HISTORY: Family History  Problem Relation Age of Onset   Hypertension Mother    Hypertension Father    Diabetes Sister    Breast cancer Neg Hx     ALLERGIES:  has no known allergies.  MEDICATIONS:  Current Outpatient Medications  Medication Sig Dispense Refill   acetaminophen  (TYLENOL ) 325 MG tablet Take 2 tablets (650 mg total) by mouth every 4 (four) hours as needed for mild pain (or temp > 37.5 C (99.5 F)).     amLODipine  (NORVASC ) 2.5 MG tablet Take 1 tablet (2.5 mg total) by mouth daily. 30 tablet 0   amLODipine  (NORVASC ) 5 MG tablet      aspirin  EC 81 MG tablet Take 1 tablet (81 mg total) by mouth daily. Swallow whole. 30 tablet    Calcium  Carbonate-Vitamin D  600-200 MG-UNIT TABS Take 1 tablet by mouth daily. 30 tablet 0   clobetasol  cream (TEMOVATE ) 0.05 % Apply topically 2 (two) times daily as needed.     docusate sodium  (COLACE) 100 MG capsule Take 1 capsule (100 mg total) by mouth daily.     iron  polysaccharides  (FERREX 150) 150 MG capsule Take 1 capsule (150 mg total) by mouth daily. 30 capsule 0   metoprolol  succinate (TOPROL -XL) 50 MG 24 hr tablet Take 1 tablet (50 mg total) by mouth daily. 30 tablet 0   metoprolol  succinate (TOPROL -XL) 50 MG 24 hr tablet Take 1 tablet by mouth daily.     Multiple Vitamins-Minerals (WOMENS MULTIVITAMIN + COLLAGEN PO) Take 1 tablet by mouth daily.     rosuvastatin  (CRESTOR ) 40 MG tablet Take 1 tablet (40 mg total) by mouth daily. 30 tablet 0   SYNTHROID  100 MCG tablet Take 1 tablet (100 mcg total) by mouth daily. 30 tablet 0   varenicline  (CHANTIX ) 0.5 MG tablet Take 1 tablet (0.5 mg total) by mouth 2 (two) times daily. 60 tablet  0   varenicline  (CHANTIX ) 1 MG tablet Take 1 mg by mouth 2 (two) times daily.     No current facility-administered medications for this visit.     PHYSICAL EXAMINATION:   Vitals:   05/14/23 1010  BP: 126/73  Pulse: 63  Resp: 16  Temp: (!) 97.2 F (36.2 C)  SpO2: 97%   Filed Weights   05/14/23 1010  Weight: 177 lb (80.3 kg)    Physical Exam Vitals and nursing note reviewed.  HENT:     Head: Normocephalic and atraumatic.     Mouth/Throat:     Pharynx: Oropharynx is clear.  Eyes:     Extraocular Movements: Extraocular movements intact.     Pupils: Pupils are equal, round, and reactive to light.  Cardiovascular:     Rate and Rhythm: Normal rate and regular rhythm.  Pulmonary:     Comments: Decreased breath sounds bilaterally.  Abdominal:     Palpations: Abdomen is soft.  Musculoskeletal:        General: Normal range of motion.     Cervical back: Normal range of motion.  Skin:    General: Skin is warm.  Neurological:     General: No focal deficit present.     Mental Status: She is alert and oriented to person, place, and time.  Psychiatric:        Behavior: Behavior normal.        Judgment: Judgment normal.      LABORATORY DATA:  I have reviewed the data as listed Lab Results  Component Value Date   WBC  3.9 (L) 04/30/2023   HGB 11.0 (L) 04/30/2023   HCT 35.1 (L) 04/30/2023   MCV 74.2 (L) 04/30/2023   PLT 178 04/30/2023   Recent Labs    09/15/22 0706 10/30/22 0859 04/30/23 0812  NA 137 139 137  K 4.3 3.8 3.5  CL 105 106 108  CO2 24 25 23   GLUCOSE 118* 108* 92  BUN 9 13 12   CREATININE 0.76 0.81 0.83  CALCIUM  9.0 9.5 9.0  GFRNONAA >60 >60 >60  PROT 6.9 7.9 7.5  ALBUMIN 3.0* 3.8 3.8  AST 26 23 22   ALT 15 13 13   ALKPHOS 59 61 78  BILITOT 0.5 0.3 0.4     No results found.  ASSESSMENT & PLAN:   Chronic anemia # Chronic intermittent anemia/ Hx of sickle cell trait [Hb electrophoresis/sickle cell]- moderate anemia; hemoglobin 10-11; intermittent microcytosis 76 MCV. March 2024- ferritin- 241.  B12 folic acid  normal normal renal function- stable.   # Low clinical concern for any malignant causes.  Suspect mild anemia-sec to thalassemia trait/sickle cell trait. s.  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 discussed regarding the possible diagnosis of MGUS.  I discussed  with the patient regarding natural history of MGUS; extremely small risk of progression to multiple myeloma.  Hold off any bone marrow biopsy.  Will recheck again in 12  months.  # Left sided stroke- stable.   # DISPOSITION: # follow up in  12  months-- MD; 2 weeks prior- labs-cbc/cmp; iron  studies; ferritin; MM panel; K/L light chain ratio -Dr.B.    All questions were answered. The patient knows to call the clinic with any problems, questions or concerns.  Gwyn Leos, MD 05/14/2023 10:41 AM

## 2023-05-14 NOTE — Progress Notes (Signed)
 Patient is doing ok, she is still having some swelling and some pain in her left knee, ever since she had 2 strokes back in August 2024. No new questions or concerns for the doctor today.

## 2023-06-11 DIAGNOSIS — R7309 Other abnormal glucose: Secondary | ICD-10-CM | POA: Diagnosis not present

## 2023-06-11 DIAGNOSIS — Z1231 Encounter for screening mammogram for malignant neoplasm of breast: Secondary | ICD-10-CM | POA: Diagnosis not present

## 2023-06-11 DIAGNOSIS — I10 Essential (primary) hypertension: Secondary | ICD-10-CM | POA: Diagnosis not present

## 2023-06-11 DIAGNOSIS — D649 Anemia, unspecified: Secondary | ICD-10-CM | POA: Diagnosis not present

## 2023-06-11 DIAGNOSIS — R8290 Unspecified abnormal findings in urine: Secondary | ICD-10-CM | POA: Diagnosis not present

## 2023-06-11 DIAGNOSIS — I69354 Hemiplegia and hemiparesis following cerebral infarction affecting left non-dominant side: Secondary | ICD-10-CM | POA: Diagnosis not present

## 2023-06-12 DIAGNOSIS — R8290 Unspecified abnormal findings in urine: Secondary | ICD-10-CM | POA: Diagnosis not present

## 2023-06-12 DIAGNOSIS — Z1231 Encounter for screening mammogram for malignant neoplasm of breast: Secondary | ICD-10-CM | POA: Diagnosis not present

## 2023-06-12 DIAGNOSIS — I69354 Hemiplegia and hemiparesis following cerebral infarction affecting left non-dominant side: Secondary | ICD-10-CM | POA: Diagnosis not present

## 2023-06-12 DIAGNOSIS — D649 Anemia, unspecified: Secondary | ICD-10-CM | POA: Diagnosis not present

## 2023-06-12 DIAGNOSIS — R7309 Other abnormal glucose: Secondary | ICD-10-CM | POA: Diagnosis not present

## 2023-06-12 DIAGNOSIS — I10 Essential (primary) hypertension: Secondary | ICD-10-CM | POA: Diagnosis not present

## 2023-06-17 ENCOUNTER — Other Ambulatory Visit (INDEPENDENT_AMBULATORY_CARE_PROVIDER_SITE_OTHER): Payer: Self-pay | Admitting: Nurse Practitioner

## 2023-06-17 DIAGNOSIS — I7143 Infrarenal abdominal aortic aneurysm, without rupture: Secondary | ICD-10-CM

## 2023-06-18 ENCOUNTER — Observation Stay

## 2023-06-18 ENCOUNTER — Observation Stay
Admission: EM | Admit: 2023-06-18 | Discharge: 2023-06-20 | Disposition: A | Discharge status: Home / Self Care | Attending: Emergency Medicine | Admitting: Emergency Medicine

## 2023-06-18 ENCOUNTER — Emergency Department

## 2023-06-18 ENCOUNTER — Other Ambulatory Visit: Payer: Self-pay

## 2023-06-18 DIAGNOSIS — E039 Hypothyroidism, unspecified: Secondary | ICD-10-CM | POA: Diagnosis not present

## 2023-06-18 DIAGNOSIS — I639 Cerebral infarction, unspecified: Secondary | ICD-10-CM | POA: Diagnosis present

## 2023-06-18 DIAGNOSIS — R29703 NIHSS score 3: Secondary | ICD-10-CM

## 2023-06-18 DIAGNOSIS — I6389 Other cerebral infarction: Secondary | ICD-10-CM | POA: Insufficient documentation

## 2023-06-18 DIAGNOSIS — I10 Essential (primary) hypertension: Secondary | ICD-10-CM | POA: Diagnosis present

## 2023-06-18 DIAGNOSIS — Z720 Tobacco use: Secondary | ICD-10-CM | POA: Diagnosis present

## 2023-06-18 DIAGNOSIS — S065XAA Traumatic subdural hemorrhage with loss of consciousness status unknown, initial encounter: Principal | ICD-10-CM

## 2023-06-18 DIAGNOSIS — Z79899 Other long term (current) drug therapy: Secondary | ICD-10-CM | POA: Insufficient documentation

## 2023-06-18 DIAGNOSIS — G9389 Other specified disorders of brain: Secondary | ICD-10-CM | POA: Diagnosis not present

## 2023-06-18 DIAGNOSIS — I6782 Cerebral ischemia: Secondary | ICD-10-CM | POA: Diagnosis not present

## 2023-06-18 DIAGNOSIS — I6529 Occlusion and stenosis of unspecified carotid artery: Secondary | ICD-10-CM | POA: Diagnosis not present

## 2023-06-18 DIAGNOSIS — D638 Anemia in other chronic diseases classified elsewhere: Secondary | ICD-10-CM | POA: Diagnosis not present

## 2023-06-18 DIAGNOSIS — Z7902 Long term (current) use of antithrombotics/antiplatelets: Secondary | ICD-10-CM | POA: Insufficient documentation

## 2023-06-18 DIAGNOSIS — D649 Anemia, unspecified: Secondary | ICD-10-CM | POA: Diagnosis not present

## 2023-06-18 DIAGNOSIS — I6200 Nontraumatic subdural hemorrhage, unspecified: Secondary | ICD-10-CM | POA: Diagnosis not present

## 2023-06-18 DIAGNOSIS — R2689 Other abnormalities of gait and mobility: Secondary | ICD-10-CM | POA: Diagnosis not present

## 2023-06-18 DIAGNOSIS — R519 Headache, unspecified: Secondary | ICD-10-CM | POA: Diagnosis not present

## 2023-06-18 DIAGNOSIS — E785 Hyperlipidemia, unspecified: Secondary | ICD-10-CM | POA: Diagnosis not present

## 2023-06-18 DIAGNOSIS — Z1331 Encounter for screening for depression: Secondary | ICD-10-CM | POA: Diagnosis not present

## 2023-06-18 DIAGNOSIS — S065X0A Traumatic subdural hemorrhage without loss of consciousness, initial encounter: Secondary | ICD-10-CM | POA: Diagnosis not present

## 2023-06-18 DIAGNOSIS — F17210 Nicotine dependence, cigarettes, uncomplicated: Secondary | ICD-10-CM | POA: Diagnosis not present

## 2023-06-18 DIAGNOSIS — Z9889 Other specified postprocedural states: Secondary | ICD-10-CM

## 2023-06-18 DIAGNOSIS — Z0000 Encounter for general adult medical examination without abnormal findings: Secondary | ICD-10-CM | POA: Diagnosis not present

## 2023-06-18 DIAGNOSIS — I672 Cerebral atherosclerosis: Secondary | ICD-10-CM | POA: Diagnosis not present

## 2023-06-18 DIAGNOSIS — I700 Atherosclerosis of aorta: Secondary | ICD-10-CM | POA: Diagnosis not present

## 2023-06-18 DIAGNOSIS — G319 Degenerative disease of nervous system, unspecified: Secondary | ICD-10-CM | POA: Diagnosis not present

## 2023-06-18 DIAGNOSIS — Z8673 Personal history of transient ischemic attack (TIA), and cerebral infarction without residual deficits: Secondary | ICD-10-CM

## 2023-06-18 DIAGNOSIS — Z95828 Presence of other vascular implants and grafts: Secondary | ICD-10-CM | POA: Diagnosis not present

## 2023-06-18 DIAGNOSIS — I69354 Hemiplegia and hemiparesis following cerebral infarction affecting left non-dominant side: Secondary | ICD-10-CM | POA: Diagnosis not present

## 2023-06-18 DIAGNOSIS — Z7982 Long term (current) use of aspirin: Secondary | ICD-10-CM | POA: Diagnosis not present

## 2023-06-18 LAB — CBC
HCT: 32 % — ABNORMAL LOW (ref 36.0–46.0)
Hemoglobin: 10 g/dL — ABNORMAL LOW (ref 12.0–15.0)
MCH: 22.8 pg — ABNORMAL LOW (ref 26.0–34.0)
MCHC: 31.3 g/dL (ref 30.0–36.0)
MCV: 73.1 fL — ABNORMAL LOW (ref 80.0–100.0)
Platelets: 220 10*3/uL (ref 150–400)
RBC: 4.38 MIL/uL (ref 3.87–5.11)
RDW: 16.6 % — ABNORMAL HIGH (ref 11.5–15.5)
WBC: 6.2 10*3/uL (ref 4.0–10.5)
nRBC: 0 % (ref 0.0–0.2)

## 2023-06-18 LAB — COMPREHENSIVE METABOLIC PANEL WITH GFR
ALT: 13 U/L (ref 0–44)
AST: 25 U/L (ref 15–41)
Albumin: 3.7 g/dL (ref 3.5–5.0)
Alkaline Phosphatase: 62 U/L (ref 38–126)
Anion gap: 10 (ref 5–15)
BUN: 22 mg/dL (ref 8–23)
CO2: 22 mmol/L (ref 22–32)
Calcium: 9.2 mg/dL (ref 8.9–10.3)
Chloride: 105 mmol/L (ref 98–111)
Creatinine, Ser: 0.97 mg/dL (ref 0.44–1.00)
GFR, Estimated: >60 mL/min (ref >60)
Glucose, Bld: 93 mg/dL (ref 70–99)
Potassium: 4 mmol/L (ref 3.5–5.1)
Sodium: 137 mmol/L (ref 135–145)
Total Bilirubin: 0.8 mg/dL (ref 0.0–1.2)
Total Protein: 7.8 g/dL (ref 6.5–8.1)

## 2023-06-18 LAB — DIFFERENTIAL
Abs Immature Granulocytes: 0.02 10*3/uL (ref 0.00–0.07)
Basophils Absolute: 0 10*3/uL (ref 0.0–0.1)
Basophils Relative: 0 %
Eosinophils Absolute: 0 10*3/uL (ref 0.0–0.5)
Eosinophils Relative: 0 %
Immature Granulocytes: 0 %
Lymphocytes Relative: 27 %
Lymphs Abs: 1.7 10*3/uL (ref 0.7–4.0)
Monocytes Absolute: 0.5 10*3/uL (ref 0.1–1.0)
Monocytes Relative: 8 %
Neutro Abs: 4 10*3/uL (ref 1.7–7.7)
Neutrophils Relative %: 65 %

## 2023-06-18 LAB — CBG MONITORING, ED: Glucose-Capillary: 82 mg/dL (ref 70–99)

## 2023-06-18 LAB — PROTIME-INR
INR: 1.1 (ref 0.8–1.2)
Prothrombin Time: 14.9 s (ref 11.4–15.2)

## 2023-06-18 LAB — APTT: aPTT: 27 s (ref 24–36)

## 2023-06-18 MED ORDER — ACETAMINOPHEN 325 MG PO TABS
650.0000 mg | ORAL_TABLET | Freq: Four times a day (QID) | ORAL | Status: DC | PRN
Start: 2023-06-18 — End: 2023-06-20
  Administered 2023-06-18 – 2023-06-20 (×7): 650 mg via ORAL
  Filled 2023-06-18 (×7): qty 2

## 2023-06-18 MED ORDER — SENNOSIDES-DOCUSATE SODIUM 8.6-50 MG PO TABS: 1.0000 | ORAL_TABLET | Freq: Every evening | ORAL | Status: DC | PRN

## 2023-06-18 MED ORDER — ACETAMINOPHEN 650 MG RE SUPP: 650.0000 mg | Freq: Four times a day (QID) | RECTAL | Status: DC | PRN

## 2023-06-18 MED ORDER — HYDRALAZINE HCL 20 MG/ML IJ SOLN: 5.0000 mg | INTRAMUSCULAR | Status: DC | PRN

## 2023-06-18 MED ORDER — STROKE: EARLY STAGES OF RECOVERY BOOK
Freq: Once | Status: DC
Start: 2023-06-19 — End: 2023-06-20

## 2023-06-18 MED ORDER — ONDANSETRON HCL 4 MG PO TABS: 4.0000 mg | ORAL_TABLET | Freq: Four times a day (QID) | ORAL | Status: DC | PRN

## 2023-06-18 MED ORDER — LEVOTHYROXINE SODIUM 100 MCG PO TABS
100.0000 ug | ORAL_TABLET | Freq: Every day | ORAL | Status: DC
  Administered 2023-06-19: 100 ug via ORAL
  Filled 2023-06-18: qty 1

## 2023-06-18 MED ORDER — NICOTINE 21 MG/24HR TD PT24: 21.0000 mg | MEDICATED_PATCH | Freq: Every day | TRANSDERMAL | Status: DC | PRN

## 2023-06-18 MED ORDER — IOHEXOL 350 MG/ML SOLN
75.0000 mL | Freq: Once | INTRAVENOUS | Status: AC | PRN
  Administered 2023-06-18: 75 mL via INTRAVENOUS

## 2023-06-18 MED ORDER — ROSUVASTATIN CALCIUM 10 MG PO TABS
40.0000 mg | ORAL_TABLET | Freq: Every day | ORAL | Status: DC
  Administered 2023-06-18 – 2023-06-19 (×2): 40 mg via ORAL
  Filled 2023-06-18 (×2): qty 4

## 2023-06-18 MED ORDER — ONDANSETRON HCL 4 MG/2ML IJ SOLN: 4.0000 mg | Freq: Four times a day (QID) | INTRAMUSCULAR | Status: DC | PRN

## 2023-06-18 NOTE — Assessment & Plan Note (Signed)
 At baseline

## 2023-06-18 NOTE — ED Notes (Signed)
 See triage note, pt endorses the same to this RN. Pt noticed she was having issues with her grip to the L hand when she couldn't grasp her coffee and it fell out of her hand. Pt also endorses HA, pt denies injury or fall. Pt is A&Ox4 and speaking clearly at this time. NAD noted.  Pt states last CVA in 2023 which has caused a deficit of L leg weakness, pt denies any other weakness to this RN, strengths are equal in all extremities.

## 2023-06-18 NOTE — ED Notes (Signed)
 Pt speaking to MRI at this time, pt is A&Ox4.

## 2023-06-18 NOTE — ED Notes (Signed)
 Pt at MRI via stretcher

## 2023-06-18 NOTE — Assessment & Plan Note (Signed)
 Needed nicotine patch ordered

## 2023-06-18 NOTE — Discharge Instructions (Addendum)
 [facility] [address]  [place], [place]  [phone] Open 24 hours  They will call you to set up when they can come for assessment

## 2023-06-18 NOTE — Assessment & Plan Note (Addendum)
 Repeat CT with stable findings, seems like having subacute and acute hematomas. Neuro surgery is on board-no need for any surgical intervention Goal systolic is between 120-160 Outpatient follow-up with neurosurgery in 2 weeks for repeat CT head No antiplatelets for now and neurosurgery will resume as appropriate Hydralazine  5 mg IV every 4 hours as needed for SBP greater 160, 5 days ordered

## 2023-06-18 NOTE — Consult Note (Signed)
 Full note to follow.  New diagnosis of subdural hematama, appears chronic.  - MRI shows stroke - admitted for workup - hold anticoagulation for now - will arrange outpatient follow-up - given chronicity, may be appropriate to hold AEDs

## 2023-06-18 NOTE — Hospital Course (Addendum)
 [name] is a 78 year old female with history of hypertension, history of prior CVA in 2023, hypothyroid, hyperlipidemia, who presents to emergency department for chief concerns of headache and left hand numbness for 2 days.  No history of head trauma or fall.  Vitals in the ED showed T of 98.4, rr 20, hr of 89, blood pressure 116/81, SpO2 of 94% on room air.  Serum sodium is 137, potassium 4.0, chloride 105, bicarb 22, BUN of 22, serum creatinine of 0.97, EGFR greater than 60, nonfasting blood glucose 93, WBC 6.2, hemoglobin 10, platelets of 220.  CT head showed subdural hematomas and MRI positive for a small right parietal lobe stroke for which neurology was consulted.  ED treatment: None. EDP discussed with Dr. Jeris Montes, who recommends a repeat CT imaging in 6 hours, repeat imaging seems stable.  5/30: Vital and labs stable, lipid panel normal with HDL of 33 and LDL of 50 CTA head and neck was negative for any LVO and mild atherosclerosis. INR and APTT normal.  Hepatic function normal.  A1c of 5.4. Echocardiogram with normal EF, grade systolic dysfunction and no other significant abnormality.  No intra-atrial shunt noted. PT/OT is recommending CIR No aspirin  on Plavix  and patient will follow-up with neurosurgery as outpatient for a repeat CT head and if stable then they can decide about starting 1 antiplatelet.  5/31: Remained hemodynamically stable.  Left upper extremity numbness resolved.  Patient thinks that she is at baseline.  She was a good candidate for CIR but later declined stating that she wants to go home with home health.  She is being discharged.  Home health orders were placed.  Her home aspirin  and Plavix  has been discontinued, she need to follow-up with neurosurgery in 2 weeks and they can decide when to restart any antiplatelet.  She will continue the rest of her home medications and follow-up with her providers for further assistance.

## 2023-06-18 NOTE — Consult Note (Signed)
 NEUROLOGY CONSULT NOTE   Date of service: Jun 18, 2023 Patient Name: Susan Farrell MRN:  [id number] DOB:  [birth year] Chief Complaint: "Left hand numbness" Requesting Provider: Cedric Cohn, DO  History of Present Illness  Susan Farrell is a 78 y.o. 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 She reports this is new from her prior strokes-she did have residual left leg weakness with the left arm motor and sensory symptoms had resolved. Denies any chest pain shortness of breath but reports a headache that is been going on now for a couple of days In the ED, CAT scan was done that showed bilateral subdural collections of different ages indicating both acute and subacute subdural hematomas.  To further evaluate her brain, MRI brain was done that showed a punctate right parietal stroke, for which neurology was consulted  LKW: 12 PM on 06/17/2023 Modified rankin score: 3-Moderate disability-requires help but walks WITHOUT assistance IV Thrombolysis: No-outside the window, also has subdurals EVT: No-exam not consistent with LVO  NIHSS components Score: Comment  1a Level of Conscious 0[x]  1[]  2[]  3[]      1b LOC Questions 0[x]  1[]  2[]       1c LOC Commands 0[x]  1[]  2[]       2 Best Gaze 0[x]  1[]  2[]       3 Visual 0[x]  1[]  2[]  3[]      4 Facial Palsy 0[x]  1[]  2[]  3[]      5a Motor Arm - left 0[x]  1[]  2[]  3[]  4[]  UN[]    5b Motor Arm - Right 0[x]  1[]  2[]  3[]  4[]  UN[]    6a Motor Leg - Left 0[]  1[]  2[x]  3[]  4[]  UN[]    6b Motor Leg - Right 0[x]  1[]  2[]  3[]  4[]  UN[]    7 Limb Ataxia 0[x]  1[]  2[]  UN[]      8 Sensory 0[]  1[x]  2[]  UN[]      9 Best Language 0[x]  1[]  2[]  3[]      10 Dysarthria 0[x]  1[]  2[]  UN[]      11 Extinct. and Inattention 0[x]  1[]  2[]       TOTAL: 3       ROS  Comprehensive ROS performed and pertinent positives documented in HPI   Past History   Past Medical History:  Diagnosis Date   Hypertension    Stroke [facility])     Past Surgical History:  Procedure Laterality Date   ABDOMINAL HYSTERECTOMY     CHOLECYSTECTOMY     ENDOVASCULAR REPAIR/STENT GRAFT N/A 06/12/2020   Procedure: ENDOVASCULAR REPAIR/STENT GRAFT;  Surgeon: Jackquelyn Mass, MD;  Location: [facility] INVASIVE CV LAB;  Service: Cardiovascular;  Laterality: N/A;    Family History: Family History  Problem Relation Age of Onset   Hypertension Mother    Hypertension Father    Diabetes Sister    Breast cancer Neg Hx     Social History  reports that she has been smoking cigarettes. She uses smokeless tobacco. She reports that she does not currently use alcohol . She reports that she does not use drugs.  No Known Allergies  Medications   Current Facility-Administered Medications:    [START ON 06/19/2023]  stroke: early stages of recovery book, , Does not apply, Once, Cox, Amy N, DO   acetaminophen  (TYLENOL ) tablet 650 mg, 650 mg, Oral, Q6H PRN **OR** acetaminophen  (TYLENOL ) suppository 650 mg, 650 mg, Rectal, Q6H PRN,  Cox, Amy N, DO   hydrALAZINE  (APRESOLINE ) injection 5 mg, 5 mg, Intravenous, Q4H PRN, Cox, Amy N, DO   [START ON 06/19/2023] levothyroxine  (SYNTHROID ) tablet 100 mcg, 100 mcg, Oral, Q0600, Cox, Amy N, DO   nicotine  (NICODERM CQ  - dosed in mg/24 hours) patch 21 mg, 21 mg, Transdermal, Daily PRN, Cox, Amy N, DO   ondansetron  (ZOFRAN ) tablet 4 mg, 4 mg, Oral, Q6H PRN **OR** ondansetron  (ZOFRAN ) injection 4 mg, 4 mg, Intravenous, Q6H PRN, Cox, Amy N, DO   rosuvastatin  (CRESTOR ) tablet 40 mg, 40 mg, Oral, QHS, Cox, Amy N, DO   senna-docusate (Senokot-S) tablet 1 tablet, 1 tablet, Oral, QHS PRN, Cox, Amy N, DO  Current Outpatient Medications:    ciprofloxacin (CIPRO) 500 MG tablet, Take 500 mg by mouth 2 (two) times daily., Disp: , Rfl:    clopidogrel  (PLAVIX ) 75 MG  tablet, Take 75 mg by mouth daily., Disp: , Rfl:    nitrofurantoin, macrocrystal-monohydrate, (MACROBID) 100 MG capsule, Take 100 mg by mouth 2 (two) times daily., Disp: , Rfl:    acetaminophen  (TYLENOL ) 325 MG tablet, Take 2 tablets (650 mg total) by mouth every 4 (four) hours as needed for mild pain (or temp > 37.5 C (99.5 F))., Disp: , Rfl:    amLODipine  (NORVASC ) 2.5 MG tablet, Take 1 tablet (2.5 mg total) by mouth daily., Disp: 30 tablet, Rfl: 0   amLODipine  (NORVASC ) 5 MG tablet, , Disp: , Rfl:    aspirin  EC 81 MG tablet, Take 1 tablet (81 mg total) by mouth daily. Swallow whole., Disp: 30 tablet, Rfl:    Calcium  Carbonate-Vitamin D  600-200 MG-UNIT TABS, Take 1 tablet by mouth daily., Disp: 30 tablet, Rfl: 0   clobetasol  cream (TEMOVATE ) 0.05 %, Apply topically 2 (two) times daily as needed., Disp: , Rfl:    docusate sodium  (COLACE) 100 MG capsule, Take 1 capsule (100 mg total) by mouth daily., Disp: , Rfl:    iron  polysaccharides (FERREX 150) 150 MG capsule, Take 1 capsule (150 mg total) by mouth daily., Disp: 30 capsule, Rfl: 0   metoprolol  succinate (TOPROL -XL) 50 MG 24 hr tablet, Take 1 tablet (50 mg total) by mouth daily., Disp: 30 tablet, Rfl: 0   metoprolol  succinate (TOPROL -XL) 50 MG 24 hr tablet, Take 1 tablet by mouth daily., Disp: , Rfl:    Multiple Vitamins-Minerals (WOMENS MULTIVITAMIN + COLLAGEN PO), Take 1 tablet by mouth daily., Disp: , Rfl:    rosuvastatin  (CRESTOR ) 40 MG tablet, Take 1 tablet (40 mg total) by mouth daily., Disp: 30 tablet, Rfl: 0   SYNTHROID  100 MCG tablet, Take 1 tablet (100 mcg total) by mouth daily., Disp: 30 tablet, Rfl: 0   varenicline  (CHANTIX ) 0.5 MG tablet, Take 1 tablet (0.5 mg total) by mouth 2 (two) times daily., Disp: 60 tablet, Rfl: 0   varenicline  (CHANTIX ) 1 MG tablet, Take 1 mg by mouth 2 (two) times daily., Disp: , Rfl:   Vitals   Vitals:   06/18/23 0948 06/18/23 1200  BP: 116/81 134/69  Pulse: 89 73  Resp: 20 20  Temp: 98.4 F (36.9  C)   SpO2: 94% 100%   Physical Exam   General awake alert in no distress HEENT: Normocephalic atraumatic Lungs: Clear Cardiovascular: Regular rhythm Neurological exam Awake alert oriented x 3 No dysarthria No aphasia Cranial nerves: 2-12: Intact Motor examination-left lower extremity is barely 3/5.  All other extremities are 5/5. Sensory exam: Diminished sensation in the left hand, otherwise intact Coordination exam with no  gross dysmetria  Labs/Imaging/Neurodiagnostic studies   CBC:  Recent Labs  Lab Jul 11, 2023 1010  WBC 6.2  NEUTROABS 4.0  HGB 10.0*  HCT 32.0*  MCV 73.1*  PLT 220   Basic Metabolic Panel:  Lab Results  Component Value Date   NA 137 07/11/23   K 4.0 07/11/2023   CO2 22 11-Jul-2023   GLUCOSE 93 2023-07-11   BUN 22 07-11-2023   CREATININE 0.97 07-11-2023   CALCIUM  9.2 11-Jul-2023   GFRNONAA >60 07-11-23   Lipid Panel:  Lab Results  Component Value Date   LDLCALC 52 09/09/2022   HgbA1c:  Lab Results  Component Value Date   HGBA1C 6.3 (H) 09/09/2022   Urine Drug Screen:     Component Value Date/Time   LABOPIA NONE DETECTED 08/28/2021 2119   COCAINSCRNUR NONE DETECTED 08/28/2021 2119   LABBENZ NONE DETECTED 08/28/2021 2119   AMPHETMU NONE DETECTED 08/28/2021 2119   THCU NONE DETECTED 08/28/2021 2119   LABBARB NONE DETECTED 08/28/2021 2119    Alcohol  Level     Component Value Date/Time   ETH <10 08/28/2021 2018   INR  Lab Results  Component Value Date   INR 1.1 07/11/23   APTT  Lab Results  Component Value Date   APTT 27 July 11, 2023   CT Head without contrast(Personally reviewed): Mixed density bilateral subdural hematomas-new from August 2024.  MRI Brain(Personally reviewed): 3 mm acute/subacute infarct within the right parietal lobe. Subdural hematomas along the bilateral cerebral convexities unchanged in size from the head CT performed earlier in the day-measuring up to 5 mm in thickness on the right and up to 10 mm in  thickness on the left.  No midline shift.  Background parenchymal atrophy, small vessel disease.  Chronic infarcts-right corona radiata, right basal ganglia.  Chronic, send and deposition associated with chronic right basal ganglia lacunar infarction   ASSESSMENT   Susan Farrell is a 78 y.o. female resenting with headaches and left arm numbness starting somewhere around noontime yesterday.  CT head with bilateral mixed density subdural hematomas.  MRI brain with a 3 mm acute to subacute infarct in the right parietal lobe Has history of prior strokes-etiology cryptogenic At this time with the subdural hematomas, cannot give her antiplatelets or anticoagulants Might benefit from repeat workup since last workup was about an year ago.  Impression: Bilateral acute to subacute hematomas Right parietal acute to subacute ischemic infarct-etiology cryptogenic  RECOMMENDATIONS  Admit to hospitalist Frequent neurochecks I have ordered a CT angiogram for 7 PM, that will include a repeat CT head to look for hematoma stability.  Neurosurgery has been consulted by the ED. No antiplatelets or anticoagulants Blood pressure goal given the subdural hematoma-unclear how old the subdural is, I would recommend keeping systolic blood pressure between 120-160.  Avoiding hypotension and hypertension is both necessary in the case of subdural hematomas as well as ischemic stroke concomitantly happening in this patient. 2D echo A1c Lipid panel Therapy assessments N.p.o. until cleared by bedside swallow evaluation Will follow the studies with you in the morning. Plan relayed to Dr. Vicenta Graft and discussed with Dr. Reinhold Carbine via secure chat ______________________________________________________________________  Signed, Tona Francis, MD [facility]

## 2023-06-18 NOTE — ED Triage Notes (Signed)
 Pt to Ed via [facility] from [facility]. Pt reports HA x2 days. Pt reports woke up yesterday at 6am and was normal. Pt reports yesterday afternoon (unsure of time) she was unable to hold anything in her left hand. No drift noted. Grip strength equal. Denies numbness/tingling or vision changes. Pt reports residual weakness in left leg from prior stroke. Pt is on blood thinner. Pt states HA 10/10

## 2023-06-18 NOTE — ED Notes (Signed)
 PIV attempted X1 with no success, CT tech attempted PIV X1 with no success, MD aware, MD wants to wait for need of IV at this time.

## 2023-06-18 NOTE — Assessment & Plan Note (Signed)
Home rosuvastatin 40 mg nightly resumed 

## 2023-06-18 NOTE — ED Notes (Signed)
 Pt to CT

## 2023-06-18 NOTE — Assessment & Plan Note (Addendum)
 In 2023 with left persistent lower extremity weakness and swelling of the left lower extremity that is baseline Patient currently on on aspirin  and Plavix , which will be held on admission

## 2023-06-18 NOTE — ED Provider Notes (Signed)
 [facility] Provider Note    Event Date/Time   First MD Initiated Contact with Patient 06/18/23 [phone]     (approximate)   History   Chief Complaint: Weakness   HPI  Susan Farrell is a 78 y.o. female with a history of hypertension, prior stroke who comes the ED complaining of left hand numbness that started yesterday afternoon sometime.  Sudden onset.  Has also been having a mild, gradual onset headache for the last 2 days.  No vision changes.  No muscle weakness, but this morning she dropped her coffee cup due to inability to feel the contact between her hand and the cup.  Denies any trauma, no recent falls.  No blood thinner use.        Past Medical History:  Diagnosis Date   Hypertension    Stroke [facility])     Current Outpatient Rx   Order #: [id number] Class: OTC   Order #: [id number] Class: Normal   Order #: [id number] Class: Historical Med   Order #: [id number] Class: OTC   Order #: [id number] Class: Normal   Order #: [id number] Class: Historical Med   Order #: [id number] Class: OTC   Order #: [id number] Class: Normal   Order #: [id number] Class: Normal   Order #: [id number] Class: Historical Med   Order #: [id number] Class: Historical Med   Order #: [id number] Class: Normal   Order #: [id number] Class: Normal   Order #: [id number] Class: Normal   Order #: [id number] Class: Historical Med    Past Surgical History:  Procedure Laterality Date   ABDOMINAL HYSTERECTOMY     CHOLECYSTECTOMY     ENDOVASCULAR REPAIR/STENT GRAFT N/A 06/12/2020   Procedure: ENDOVASCULAR REPAIR/STENT GRAFT;  Surgeon: Jackquelyn Mass, MD;  Location: [facility] INVASIVE CV LAB;  Service: Cardiovascular;  Laterality: N/A;    Physical Exam   Triage Vital Signs: ED Triage Vitals [06/18/23 0948]  Encounter Vitals Group     BP 116/81     Systolic BP Percentile      Diastolic BP Percentile      Pulse Rate 89     Resp 20     Temp 98.4 F (36.9 C)     Temp src      SpO2 94 %     Weight       Height      Head Circumference      Peak Flow      Pain Score 10     Pain Loc      Pain Education      Exclude from Growth Chart     Most recent vital signs: Vitals:   06/18/23 0948 06/18/23 1200  BP: 116/81 134/69  Pulse: 89 73  Resp: 20 20  Temp: 98.4 F (36.9 C)   SpO2: 94% 100%    General: Awake, no distress.  CV:  Good peripheral perfusion.  Regular rate rhythm Resp:  Normal effort.  Clear to auscultation bilaterally Abd:  No distention.  Soft nontender Other:  Visual fields intact.  No nystagmus.  PERRL, EOMI.  No motor drift.  Cranial nerves III through XII intact.  Isolated left hand sensory loss.  NIH stroke scale 1   ED Results / Procedures / Treatments   Labs (all labs ordered are listed, but only abnormal results are displayed) Labs Reviewed  CBC - Abnormal; Notable for the following components:      Result Value   Hemoglobin 10.0 (*)    HCT 32.0 (*)    MCV 73.1 (*)    [facility]  22.8 (*)    RDW 16.6 (*)    All other components within normal limits  COMPREHENSIVE METABOLIC PANEL WITH GFR  PROTIME-INR  APTT  DIFFERENTIAL  CBG MONITORING, ED     EKG Interpreted by me Sinus rhythm rate of 83, normal axis, normal intervals.  Normal QRS.  Slight ST depression in inferior and lateral leads, unchanged compared to previous EKG on September 08, 2022.   RADIOLOGY CT head interpreted by me, no apparent hemorrhage or mass.  Radiology report reviewed   PROCEDURES:  Procedures   MEDICATIONS ORDERED IN ED: Medications - No data to display   IMPRESSION / MDM / ASSESSMENT AND PLAN / ED COURSE  I reviewed the triage vital signs and the nursing notes.  DDx: CVA, intracranial hemorrhage, unilateral peripheral neuropathy, electrolyte derangement  Patient's presentation is most consistent with acute presentation with potential threat to life or bodily function.  Patient presents with acute left hand numbness, most concerning for CVA.  Will obtain CT head and  labs.   Clinical Course as of 06/18/23 1445  Thu Jun 18, 2023  1045 CT d/w radiology, noting b/l SDH, L > R. No shift.  [PS]  [id number] Neurology agrees with MRI to rule out stroke, which is pending.  Neurosurgery recommends 6-hour repeat CT head, if stable can follow-up in office for subdural hematomas. [PS]  1429 MRI positive for right parietal infarct consistent with her left hand numbness.  Will need to admit for stroke workup. [PS]    Clinical Course User Index [PS] Jacquie Maudlin, MD    ----------------------------------------- 2:45 PM on 06/18/2023 ----------------------------------------- Case discussed with hospitalist.  Mental status remains intact.  Will need to defer antiplatelets/anticoagulants until follow-up CT confirms subdurals are stable.   FINAL CLINICAL IMPRESSION(S) / ED DIAGNOSES   Final diagnoses:  Bilateral subdural hematomas (HCC)  Acute CVA (cerebrovascular accident) (HCC)     Rx / DC Orders   ED Discharge Orders     None        Note:  This document was prepared using Dragon voice recognition software and may include unintentional dictation errors.   Jacquie Maudlin, MD 06/18/23 [phone]

## 2023-06-18 NOTE — Assessment & Plan Note (Addendum)
 Right parietal stroke Neurology has been consulted and we appreciate further recommendations Complete echo Fasting lipid and A1c ordered Frequent neuro vascular checks PT, OT Fall precaution

## 2023-06-18 NOTE — Assessment & Plan Note (Signed)
Home levothyroxine 100 mcg daily resumed

## 2023-06-18 NOTE — H&P (Addendum)
 History and Physical   Susan Farrell UJW:[id number] DOB: 1945/07/17 DOA: 06/18/2023  PCP: Antonio Baumgarten, MD  Outpatient Specialists:Dr. Lenoria Raid neurology Patient coming from: [facility] via POV  I have personally briefly reviewed patient's old medical records in [facility] EMR.  Chief Concern: headache and decreased left hand grip  HPI: Susan Farrell is a 78 year old female with history of hypertension, history of prior CVA in 2023, hypothyroid, hyperlipidemia, who presents to emergency department for chief concerns of headache for 2 days.  Vitals in the ED showed T of 98.4, rr 20, hr of 89, blood pressure 116/81, SpO2 of 94% on room air.  Serum sodium is 137, potassium 4.0, chloride 105, bicarb 22, BUN of 22, serum creatinine of 0.97, EGFR greater than 60, nonfasting blood glucose 93, WBC 6.2, hemoglobin 10, platelets of 220.  ED treatment: None. EDP discussed with Dr. Jeris Montes, who recommends a repeat CT imaging in 6 hours, which has been ordered and scheduled by EDP.  EDP also discussed with neurologist, Dr. Bonnita Buttner regarding left parietal stroke. ---------------------------------------- At bedside, patient is able to tell me her name, age, location.  She reports a persistent headache that started about 2 days ago.  She denies trauma to her person, including falling, head trauma, being hit.  She reports she is never felt this headache before.  She denies double vision, syncope.  She reports that yesterday she developed left hand numbness and has gradually worsened to the point where she cannot hold a cup of coffee.  She denies fever, chills, chest pain, dysuria, abdominal pain, nausea, vomiting, fever, chills, hematuria.  Social history: She lives at home with her husband.  She denies tobacco, EtOH, recreational drug use.  She is retired.  ROS: Constitutional: no weight change, no fever ENT/Mouth: no sore throat, no rhinorrhea Eyes: no eye pain, no vision  changes Cardiovascular: no chest pain, no dyspnea,  no edema, no palpitations Respiratory: no cough, no sputum, no wheezing Gastrointestinal: no nausea, no vomiting, no diarrhea, no constipation Genitourinary: no urinary incontinence, no dysuria, no hematuria Musculoskeletal: no arthralgias, no myalgias Skin: no skin lesions, no pruritus, Neuro: + weakness, no loss of consciousness, no syncope Psych: no anxiety, no depression, + decrease appetite Heme/Lymph: no bruising, no bleeding  ED Course: Discussed with EDP, patient requiring hospitalization for chief concerns of subdural hematoma and right parietal ischemic stroke.  Assessment/Plan  Principal Problem:   Subdural hematoma (HCC) Active Problems:   Acute stroke due to ischemia (HCC)   Dyslipidemia   Hypothyroidism   History of abdominal aortic aneurysm (AAA) repair   HTN (hypertension)   Anemia of chronic disease   Tobacco abuse   History of stroke   Assessment and Plan:  * Subdural hematoma (HCC) Hydralazine  5 mg IV every 4 hours as needed for SBP greater 160, 5 days ordered CT of the head without contrast imaging ordered by EDP at 5 PM scheduled and per EDP, if CT of the head is negative for noticeable expansion of the subdural hematoma, patient can follow-up with neurosurgery outpatient  Acute stroke due to ischemia [facility]) Right parietal stroke Neurology has been consulted and we appreciate further recommendations Complete echo Fasting lipid and A1c ordered Frequent neuro vascular checks PT, OT Fall precaution  Hypothyroidism Home levothyroxine  100 mcg daily resumed  Dyslipidemia Home rosuvastatin  40 mg nightly resumed  History of stroke In 2023 with left persistent lower extremity weakness and swelling of the left lower extremity that is baseline Patient currently on on  aspirin  and Plavix , which will be held on admission  Tobacco abuse Needed nicotine patch ordered  Anemia of chronic disease At  baseline  Chart reviewed.   DVT prophylaxis: None in setting of subdural hematoma; TED hose Code Status: Full code Diet: Heart healthy Family Communication: A phone call was offered, patient declined Disposition Plan: Pending clinical course Consults called: Neurology; neurosurgery via EDP Admission status: PCU, observation  Past Medical History:  Diagnosis Date   Hypertension    Stroke [facility])    Past Surgical History:  Procedure Laterality Date   ABDOMINAL HYSTERECTOMY     CHOLECYSTECTOMY     ENDOVASCULAR REPAIR/STENT GRAFT N/A 06/12/2020   Procedure: ENDOVASCULAR REPAIR/STENT GRAFT;  Surgeon: Jackquelyn Mass, MD;  Location: [facility] INVASIVE CV LAB;  Service: Cardiovascular;  Laterality: N/A;   Social History:  reports that she has been smoking cigarettes. She uses smokeless tobacco. She reports that she does not currently use alcohol . She reports that she does not use drugs.  No Known Allergies Family History  Problem Relation Age of Onset   Hypertension Mother    Hypertension Father    Diabetes Sister    Breast cancer Neg Hx    Family history: Family history reviewed and not pertinent.  Prior to Admission medications   Medication Sig Start Date End Date Taking? Authorizing Provider  ciprofloxacin (CIPRO) 500 MG tablet Take 500 mg by mouth 2 (two) times daily. 02/17/23  Yes [provider]  clopidogrel  (PLAVIX ) 75 MG tablet Take 75 mg by mouth daily. 05/05/23 08/03/23 Yes [provider]  nitrofurantoin, macrocrystal-monohydrate, (MACROBID) 100 MG capsule Take 100 mg by mouth 2 (two) times daily. 04/01/23  Yes [provider]  acetaminophen  (TYLENOL ) 325 MG tablet Take 2 tablets (650 mg total) by mouth every 4 (four) hours as needed for mild pain (or temp > 37.5 C (99.5 F)). 09/25/22   Setzer, Sandra J, PA-C  amLODipine  (NORVASC ) 2.5 MG tablet Take 1 tablet (2.5 mg total) by mouth daily. 09/25/22   Setzer, Sandra J, PA-C  amLODipine  (NORVASC ) 5 MG tablet   01/03/23   [provider]  aspirin  EC 81 MG tablet Take 1 tablet (81 mg total) by mouth daily. Swallow whole. 09/03/21   Alphonsus Jeans, MD  Calcium  Carbonate-Vitamin D  600-200 MG-UNIT TABS Take 1 tablet by mouth daily. 09/12/21   Angiulli, Daniel J, PA-C  clobetasol  cream (TEMOVATE ) 0.05 % Apply topically 2 (two) times daily as needed. 08/25/22   [provider]  docusate sodium  (COLACE) 100 MG capsule Take 1 capsule (100 mg total) by mouth daily. 09/25/22   Setzer, Hamp Levine, PA-C  iron  polysaccharides (FERREX 150) 150 MG capsule Take 1 capsule (150 mg total) by mouth daily. 09/25/22   Setzer, Sandra J, PA-C  metoprolol  succinate (TOPROL -XL) 50 MG 24 hr tablet Take 1 tablet (50 mg total) by mouth daily. 09/25/22   Setzer, Sandra J, PA-C  metoprolol  succinate (TOPROL -XL) 50 MG 24 hr tablet Take 1 tablet by mouth daily. 11/25/22   [provider]  Multiple Vitamins-Minerals (WOMENS MULTIVITAMIN + COLLAGEN PO) Take 1 tablet by mouth daily.    [provider]  rosuvastatin  (CRESTOR ) 40 MG tablet Take 1 tablet (40 mg total) by mouth daily. 09/25/22 05/14/23  Setzer, Sandra J, PA-C  SYNTHROID  100 MCG tablet Take 1 tablet (100 mcg total) by mouth daily. 09/12/21   Angiulli, Everlyn Hockey, PA-C  varenicline  (CHANTIX ) 0.5 MG tablet Take 1 tablet (0.5 mg total) by mouth 2 (two)  times daily. 09/25/22   Setzer, Sandra J, PA-C  varenicline  (CHANTIX ) 1 MG tablet Take 1 mg by mouth 2 (two) times daily. 10/23/22   [provider]   Physical Exam: Vitals:   06/18/23 0948 06/18/23 1200 06/18/23 1601  BP: 116/81 134/69 (!) 145/77  Pulse: 89 73 85  Resp: 20 20 20   Temp: 98.4 F (36.9 C)  98.9 F (37.2 C)  TempSrc:   Oral  SpO2: 94% 100% 100%   Constitutional: appears age-appropriate, NAD, calm Eyes: PERRL, lids and conjunctivae normal ENMT: Mucous membranes are moist. Posterior pharynx clear of any exudate or lesions. Age-appropriate dentition. Hearing appropriate Neck: normal,  supple, no masses, no thyromegaly Respiratory: clear to auscultation bilaterally, no wheezing, no crackles. Normal respiratory effort. No accessory muscle use.  Cardiovascular: Regular rate and rhythm, no murmurs / rubs / gallops. No extremity edema. 2+ pedal pulses. No carotid bruits.  Abdomen: no tenderness, no masses palpated, no hepatosplenomegaly. Bowel sounds positive.  Musculoskeletal: no clubbing / cyanosis. No joint deformity upper and lower extremities. Good ROM, no contractures, no atrophy. Normal muscle tone.  Skin: no rashes, lesions, ulcers. No induration Neurologic: Sensation intact.  Left upper extremity weakness.  Left lower extremity weakness. Psychiatric: Normal judgment and insight. Alert and oriented x 3. Normal mood.   EKG: independently reviewed, showing sinus rhythm with rate of 83, QTc 415  Chest x-ray on Admission: not indicated at this time  MR BRAIN WO CONTRAST Result Date: 06/18/2023 CLINICAL DATA:  Provided history: Neuro deficit, acute, stroke suspected. EXAM: MRI HEAD WITHOUT CONTRAST TECHNIQUE: Multiplanar, multiecho pulse sequences of the brain and surrounding structures were obtained without intravenous contrast. COMPARISON:  Head CT 06/18/2023.  Brain MRI 09/09/2022. FINDINGS: Brain: Mild generalized cerebral atrophy. 3 mm acute/subacute infarct within the right parietal lobe at the gray-white junction (series 5, image 36) (series 7, image 13). Subdural hematomas again demonstrated along the bilateral cerebral convexities, unchanged in size. As before, the subdural hematoma on the right measures up to 5 mm in thickness and the subdural hematoma on the left measures up to 10 mm in thickness. No midline shift. Chronic lacunar infarcts again demonstrated within the right corona radiata and right basal ganglia. Chronic hemosiderin deposition associated with the chronic right basal ganglia lacunar infarct. Mild multifocal T2 FLAIR hyperintense signal abnormality elsewhere  within the cerebral white matter, nonspecific but compatible with chronic small vessel ischemic disease. Known sizable chronic infarct within the right cerebellar hemisphere (SCA territory). Additional small chronic infarcts within the bilateral cerebellar hemispheres, unchanged. Partially empty sella turcica. No evidence of an intracranial mass. Vascular: Maintained flow voids within the proximal large arterial vessels. Skull and upper cervical spine: No focal worrisome marrow lesion. Sinuses/Orbits: No mass or acute finding within the imaged orbits. No significant paranasal sinus disease. IMPRESSION: 1. 3 mm acute/subacute infarct within the right parietal lobe. 2. Subdural hematomas along the bilateral cerebral convexities, unchanged in size from the head CT performed earlier today (measuring up to 5 mm in thickness on the right and up to 10 mm in thickness on the left). No midline shift. 3. Background parenchymal atrophy, chronic small vessel ischemic disease and chronic infarcts, as described. Electronically Signed   By: Bascom Lily D.O.   On: 06/18/2023 14:25   CT HEAD WO CONTRAST Addendum Date: 06/18/2023 ADDENDUM REPORT: 06/18/2023 10:58 ADDENDUM: Study discussed by telephone with Dr. Evan Hillock STAFFORD on 06/18/2023 at 1046 hours. Electronically Signed   By: Marlise Simpers M.D.   On: 06/18/2023  10:58   Result Date: 06/18/2023 CLINICAL DATA:  78 year old female with headache for 2 days. History of right cerebellar infarct last year. EXAM: CT HEAD WITHOUT CONTRAST TECHNIQUE: Contiguous axial images were obtained from the base of the skull through the vertex without intravenous contrast. RADIATION DOSE REDUCTION: This exam was performed according to the departmental dose-optimization program which includes automated exposure control, adjustment of the mA and/or kV according to patient size and/or use of iterative reconstruction technique. COMPARISON:  Head CT 09/08/2022, brain MRI 09/09/2022 and earlier. FINDINGS:  Brain: Mixed density bilateral subdural hematoma. These appear new from the August MRI. Right side more holo hemispheric hematoma measures up to 5 mm in thickness. Left side more localized appearing superior convexity hematoma measures up to 10 mm in thickness. See coronal images 38 and 41). Mild associated intracranial mass effect with no significant midline shift (series 2, image 14 and coronal image 31). No other intracranial hemorrhage identified. Basilar cisterns remain patent. Chronic infarcts in the right cerebellum SCA territory and the right basal ganglia with encephalomalacia. Stable gray-white differentiation elsewhere. Vascular: No suspicious intracranial vascular hyperdensity. Calcified atherosclerosis at the skull base. Skull: Stable and intact.  No fracture identified. Sinuses/Orbits: Visualized paranasal sinuses and mastoids are clear. Other: No acute orbit or scalp soft tissue finding identified. IMPRESSION: 1. Positive for mixed density bilateral Subdural Hematomas, new since 09/09/2022. 2. Left side SDH is larger but more localized to the superior convexity, up to 10 mm in thickness. 3. Right SDH more generalized, 5 mm. 4. Mild associated intracranial mass effect with no significant midline shift. No skull fracture identified. 5. 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 Electronically Signed: By: Marlise Simpers M.D. On: 06/18/2023 10:42   Labs on Admission: I have personally reviewed following labs  CBC: Recent Labs  Lab 06/18/23 1010  WBC 6.2  NEUTROABS 4.0  HGB 10.0*  HCT 32.0*  MCV 73.1*  PLT 220   Basic Metabolic Panel: Recent Labs  Lab 06/18/23 1010  NA 137  K 4.0  CL 105  CO2 22  GLUCOSE 93  BUN 22  CREATININE 0.97  CALCIUM  9.2   GFR: CrCl cannot be calculated (Unknown ideal weight.).  Liver Function Tests: Recent Labs  Lab 06/18/23 1010  AST 25  ALT 13  ALKPHOS 62  BILITOT 0.8  PROT 7.8  ALBUMIN 3.7   Coagulation Profile: Recent Labs  Lab  06/18/23 1010  INR 1.1   CBG: Recent Labs  Lab 06/18/23 0952  GLUCAP 82   Urine analysis:    Component Value Date/Time   COLORURINE YELLOW (A) 08/28/2021 2119   APPEARANCEUR CLEAR (A) 08/28/2021 2119   APPEARANCEUR Hazy 02/10/2012 0812   LABSPEC 1.012 08/28/2021 2119   LABSPEC 1.015 02/10/2012 0812   PHURINE 7.0 08/28/2021 2119   GLUCOSEU NEGATIVE 08/28/2021 2119   GLUCOSEU Negative 02/10/2012 0812   HGBUR NEGATIVE 08/28/2021 2119   BILIRUBINUR NEGATIVE 08/28/2021 2119   BILIRUBINUR Negative 02/10/2012 0812   KETONESUR NEGATIVE 08/28/2021 2119   PROTEINUR NEGATIVE 08/28/2021 2119   NITRITE NEGATIVE 08/28/2021 2119   LEUKOCYTESUR TRACE (A) 08/28/2021 2119   LEUKOCYTESUR Negative 02/10/2012 0812   This document was prepared using Dragon Voice Recognition software and may include unintentional dictation errors.  Dr. Reinhold Carbine [facility]  If 7PM-7AM, please contact overnight-coverage provider If 7AM-7PM, please contact day attending provider [email or web address]  06/18/2023, 4:43 PM

## 2023-06-19 ENCOUNTER — Observation Stay
Admit: 2023-06-19 | Discharge: 2023-06-19 | Disposition: A | Discharge status: Home / Self Care | Attending: Internal Medicine | Admitting: Internal Medicine

## 2023-06-19 DIAGNOSIS — R2689 Other abnormalities of gait and mobility: Secondary | ICD-10-CM | POA: Diagnosis not present

## 2023-06-19 DIAGNOSIS — Z9889 Other specified postprocedural states: Secondary | ICD-10-CM | POA: Diagnosis not present

## 2023-06-19 DIAGNOSIS — Z8673 Personal history of transient ischemic attack (TIA), and cerebral infarction without residual deficits: Secondary | ICD-10-CM | POA: Diagnosis not present

## 2023-06-19 DIAGNOSIS — I63521 Cerebral infarction due to unspecified occlusion or stenosis of right anterior cerebral artery: Secondary | ICD-10-CM | POA: Diagnosis not present

## 2023-06-19 DIAGNOSIS — D638 Anemia in other chronic diseases classified elsewhere: Secondary | ICD-10-CM | POA: Diagnosis not present

## 2023-06-19 DIAGNOSIS — I6200 Nontraumatic subdural hemorrhage, unspecified: Secondary | ICD-10-CM | POA: Diagnosis not present

## 2023-06-19 DIAGNOSIS — E785 Hyperlipidemia, unspecified: Secondary | ICD-10-CM | POA: Diagnosis not present

## 2023-06-19 DIAGNOSIS — Z720 Tobacco use: Secondary | ICD-10-CM | POA: Diagnosis not present

## 2023-06-19 DIAGNOSIS — I69354 Hemiplegia and hemiparesis following cerebral infarction affecting left non-dominant side: Secondary | ICD-10-CM

## 2023-06-19 DIAGNOSIS — I10 Essential (primary) hypertension: Secondary | ICD-10-CM | POA: Diagnosis not present

## 2023-06-19 DIAGNOSIS — Z7902 Long term (current) use of antithrombotics/antiplatelets: Secondary | ICD-10-CM | POA: Diagnosis not present

## 2023-06-19 DIAGNOSIS — I639 Cerebral infarction, unspecified: Secondary | ICD-10-CM | POA: Diagnosis not present

## 2023-06-19 DIAGNOSIS — I6389 Other cerebral infarction: Secondary | ICD-10-CM | POA: Diagnosis not present

## 2023-06-19 DIAGNOSIS — E039 Hypothyroidism, unspecified: Secondary | ICD-10-CM

## 2023-06-19 DIAGNOSIS — S065XAA Traumatic subdural hemorrhage with loss of consciousness status unknown, initial encounter: Secondary | ICD-10-CM | POA: Diagnosis not present

## 2023-06-19 DIAGNOSIS — R29703 NIHSS score 3: Secondary | ICD-10-CM | POA: Diagnosis not present

## 2023-06-19 DIAGNOSIS — F17210 Nicotine dependence, cigarettes, uncomplicated: Secondary | ICD-10-CM | POA: Diagnosis not present

## 2023-06-19 LAB — LIPID PANEL
Cholesterol: 94 mg/dL (ref 0–200)
HDL: 33 mg/dL — ABNORMAL LOW (ref >40)
LDL Cholesterol: 50 mg/dL (ref 0–99)
Total CHOL/HDL Ratio: 2.8 ratio
Triglycerides: 53 mg/dL (ref <150)
VLDL: 11 mg/dL (ref 0–40)

## 2023-06-19 LAB — CBC
HCT: 29.5 % — ABNORMAL LOW (ref 36.0–46.0)
Hemoglobin: 9.8 g/dL — ABNORMAL LOW (ref 12.0–15.0)
MCH: 23.6 pg — ABNORMAL LOW (ref 26.0–34.0)
MCHC: 33.2 g/dL (ref 30.0–36.0)
MCV: 71.1 fL — ABNORMAL LOW (ref 80.0–100.0)
Platelets: 206 10*3/uL (ref 150–400)
RBC: 4.15 MIL/uL (ref 3.87–5.11)
RDW: 16.6 % — ABNORMAL HIGH (ref 11.5–15.5)
WBC: 5.8 10*3/uL (ref 4.0–10.5)
nRBC: 0 % (ref 0.0–0.2)

## 2023-06-19 LAB — BASIC METABOLIC PANEL WITH GFR
Anion gap: 9 (ref 5–15)
BUN: 18 mg/dL (ref 8–23)
CO2: 23 mmol/L (ref 22–32)
Calcium: 9 mg/dL (ref 8.9–10.3)
Chloride: 105 mmol/L (ref 98–111)
Creatinine, Ser: 0.84 mg/dL (ref 0.44–1.00)
GFR, Estimated: >60 mL/min (ref >60)
Glucose, Bld: 99 mg/dL (ref 70–99)
Potassium: 3.8 mmol/L (ref 3.5–5.1)
Sodium: 137 mmol/L (ref 135–145)

## 2023-06-19 LAB — HEMOGLOBIN A1C
Hgb A1c MFr Bld: 5.4 % (ref 4.8–5.6)
Mean Plasma Glucose: 108.28 mg/dL

## 2023-06-19 LAB — ECHOCARDIOGRAM COMPLETE
AR max vel: 2.59 cm2
AV Area VTI: 2.81 cm2
AV Area mean vel: 2.68 cm2
AV Mean grad: 5 mmHg
AV Peak grad: 10.6 mmHg
Ao pk vel: 1.63 m/s
Area-P 1/2: 3.21 cm2
MV VTI: 2.67 cm2
S' Lateral: 2.3 cm

## 2023-06-19 MED ORDER — AMLODIPINE BESYLATE 5 MG PO TABS
5.0000 mg | ORAL_TABLET | Freq: Every day | ORAL | Status: DC
  Administered 2023-06-19 – 2023-06-20 (×2): 5 mg via ORAL
  Filled 2023-06-19 (×2): qty 1

## 2023-06-19 MED ORDER — LEVOTHYROXINE SODIUM 88 MCG PO TABS
88.0000 ug | ORAL_TABLET | Freq: Every day | ORAL | Status: DC
  Administered 2023-06-20: 88 ug via ORAL
  Filled 2023-06-19: qty 1

## 2023-06-19 MED ORDER — METOPROLOL SUCCINATE ER 50 MG PO TB24
50.0000 mg | ORAL_TABLET | Freq: Every day | ORAL | Status: DC
  Administered 2023-06-19 – 2023-06-20 (×2): 50 mg via ORAL
  Filled 2023-06-19 (×2): qty 1

## 2023-06-19 MED ORDER — OYSTER SHELL CALCIUM/D3 500-5 MG-MCG PO TABS
1.0000 | ORAL_TABLET | Freq: Every day | ORAL | Status: DC
  Administered 2023-06-19 – 2023-06-20 (×2): 1 via ORAL
  Filled 2023-06-19 (×2): qty 1

## 2023-06-19 MED ORDER — TRAMADOL HCL 50 MG PO TABS
50.0000 mg | ORAL_TABLET | Freq: Four times a day (QID) | ORAL | Status: DC | PRN
  Administered 2023-06-19 – 2023-06-20 (×2): 50 mg via ORAL
  Filled 2023-06-19 (×2): qty 1

## 2023-06-19 MED ORDER — POLYSACCHARIDE IRON COMPLEX 150 MG PO CAPS
150.0000 mg | ORAL_CAPSULE | Freq: Every day | ORAL | Status: DC
  Administered 2023-06-19 – 2023-06-20 (×2): 150 mg via ORAL
  Filled 2023-06-19 (×2): qty 1

## 2023-06-19 NOTE — Plan of Care (Signed)

## 2023-06-19 NOTE — Progress Notes (Signed)
 Inpatient Rehab Admissions Coordinator Note:   Per OT recommendations patient was screened for CIR candidacy by Mickey Alar, PT. At this time, pt appears to be a potential candidate for CIR. I will place an order for rehab consult for full assessment, per our protocol.  Please contact me any with questions.Loye Rumble, PT, DPT [phone] 06/19/23 12:50 PM

## 2023-06-19 NOTE — Evaluation (Signed)
 Occupational Therapy Evaluation Patient Details Name: Susan Farrell MRN: [id number] DOB: [birth year] Today's Date: 06/19/2023   History of Present Illness   Pt is a 78 year old female presented for evaluation of left hand numbness; imaging showing bilateral subdural collections of different ages indicating both acute and subacute subdural hematomas.  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 Increased time required for direction following with ?awareness deficits of current level of function. PTA pt is generally MOD I-I in ADL, has PRN assist for IADL but grossly MOD I-I. Pt amb with quad cane per pt/husband report. Over the last week, pt has demonstrated decreased performance in overall mobility tolerance and LUE function per husband report. Pt presents with deficits in LUE function, strength, activity tolerance, endurance, ?cognition affecting safe and optimal ADL completion. Bed mobility completed with supervision, STS with MIN-MOD A, amb with quad cane with MOD A, with RW with MIN A approx 15'. Frequent cueing required for safety/technique with noted L foot drop (husband reports this is worse than typical weakness). MAX A required for clothing manipulation during toileting, supervision for peri care after toileting. Mild deficits noted in LUE FMC/strength however is able to use as a functional assist while opening tooth brush package- will continue to assess. Discussed discharge recommendation with pt/husband as pt is performing ADL/functional mobility below PLOF. Pt will benefit from skilled OT >3 hrs to facilitate return to MOD-I PLOF. Pt  is left in bedside chair, all needs met. OT will continue to follow.      If plan is discharge home, recommend the following:   A lot of help with walking and/or transfers;A little help with bathing/dressing/bathroom;Assistance with cooking/housework;Assist for transportation;Direct supervision/assist for medications management;Direct supervision/assist for financial management;Help with stairs or ramp for entrance     Functional Status Assessment   Patient has had a recent decline in their functional status and demonstrates the ability to make significant improvements in function in a reasonable and predictable amount of time.     Equipment Recommendations   Other (comment) (defer to next venue of care)     Recommendations for Other Services   Rehab consult     Precautions/Restrictions   Precautions Precautions: Fall Recall of Precautions/Restrictions: Impaired Restrictions Weight Bearing Restrictions Per Provider Order: No     Mobility Bed Mobility Overal bed mobility: Needs Assistance Bed Mobility: Supine to Sit     Supine to sit: Supervision, HOB elevated          Transfers Overall transfer level: Needs assistance Equipment used: Quad cane, Rolling walker (2 wheels) Transfers: Sit to/from Stand Sit to Stand: Min assist, Mod assist           General transfer comment: MIN A for STS with RW, MOD A for STS with quad cane      Balance Overall balance assessment: Needs assistance Sitting-balance support: Feet supported Sitting balance-Leahy Scale: Good     Standing balance support: Single extremity supported, During functional activity, Reliant on assistive device for balance Standing balance-Leahy Scale: Poor  ADL either performed or assessed with clinical judgement   ADL Overall ADL's : Needs assistance/impaired     Grooming: Sitting;Minimal assistance               Lower Body Dressing:  Maximal assistance     Toilet Transfer Details (indicate cue type and reason): MIN A with RW, MOD A with Quad cane, frequent vcs for technique and safety Toileting- Clothing Manipulation and Hygiene: Supervision/safety;Sitting/lateral lean Toileting - Clothing Manipulation Details (indicate cue type and reason): supervision for toileting, MAX A for clothing management     Functional mobility during ADLs: Moderate assistance (MIN A with RW approx 15', MOD A with quad cane approx 10'; frequent cueing for safety/technique)       Vision Patient Visual Report: No change from baseline Vision Assessment?: Yes Ocular Range of Motion: Within Functional Limits Tracking/Visual Pursuits: Able to track stimulus in all quads without difficulty Saccades: Within functional limits Convergence: Within functional limits Visual Fields: No apparent deficits     Perception Perception: Within Functional Limits       Praxis Praxis:  (? will continue to assess) Praxis Impairment Details: Motor planning Praxis-Other Comments: noted ?Anosognosia, will continue to assess   Pertinent Vitals/Pain Pain Assessment Pain Assessment: No/denies pain     Extremity/Trunk Assessment Upper Extremity Assessment Upper Extremity Assessment: Right hand dominant;LUE deficits/detail LUE Coordination: decreased fine motor;decreased gross motor   Lower Extremity Assessment Lower Extremity Assessment: Defer to PT evaluation;LLE deficits/detail LLE Deficits / Details: L foot drop while amb       Communication Communication Communication: No apparent difficulties   Cognition Arousal: Alert Behavior During Therapy: WFL for tasks assessed/performed Cognition: No apparent impairments, Cognition impaired       Memory impairment (select all impairments): Declarative long-term memory Attention impairment (select first level of impairment): Selective attention Executive functioning impairment (select all impairments):  Problem solving, Reasoning                   Following commands: Impaired Following commands impaired: Follows one step commands with increased time     Cueing  General Comments   Cueing Techniques: Verbal cues;Gestural cues;Tactile cues;Visual cues  vss throughout; pt incontinent of urine upon standing   Exercises Other Exercises Other Exercises: edu pt/husband Ron re: role of OT, role of rehab, discharge recommendations, safe ADL/functional mobility with DME   Shoulder Instructions      Home Living Family/patient expects to be discharged to:: Private residence Living Arrangements: Spouse/significant other Available Help at Discharge: Family;Available 24 hours/day Type of Home: House Home Access: Stairs to enter [employer] of Steps: 2 in front and back Entrance Stairs-Rails: None Home Layout: One level     Bathroom Shower/Tub: Producer, television/film/video: Handicapped height Bathroom Accessibility: Yes   Home Equipment: Shower seat - built Charity fundraiser (2 wheels);Cane - quad;Rollator (4 wheels)          Prior Functioning/Environment Prior Level of Function : Independent/Modified Independent;History of Falls (last six months)             Mobility Comments: pt reports she was amb with quad cane until about a week ago; per outpatient PT notes in 11/2022 pt was amb over 200' with rollator;reports a "slip" a few weeks ago ADLs Comments: pt reports MOD I-I with ADL/IADL, husband reports she was able to tie her shoes and she was unable to perform this task this week.    OT Problem List: Decreased strength;Decreased activity tolerance;Decreased coordination;Decreased  knowledge of use of DME or AE;Impaired UE functional use;Decreased safety awareness;Impaired balance (sitting and/or standing);Decreased cognition;Decreased knowledge of precautions   OT Treatment/Interventions: Self-care/ADL training;DME and/or AE instruction;Therapeutic  activities;Balance training;Therapeutic exercise;Neuromuscular education;Modalities;Visual/perceptual remediation/compensation;Energy conservation;Patient/family education      OT Goals(Current goals can be found in the care plan section)   Acute Rehab OT Goals Patient Stated Goal: go home OT Goal Formulation: With patient Time For Goal Achievement: 07/03/23 Potential to Achieve Goals: Fair ADL Goals Pt Will Perform Grooming: with modified independence;sitting Pt Will Perform Lower Body Dressing: with modified independence;sitting/lateral leans;sit to/from stand Pt Will Transfer to Toilet: with modified independence;ambulating Pt Will Perform Toileting - Clothing Manipulation and hygiene: with modified independence;sit to/from stand;sitting/lateral leans Pt/caregiver will Perform Home Exercise Program: Left upper extremity;Increased strength;With written HEP provided   OT Frequency:  Min 3X/week    Co-evaluation              AM-PAC OT "6 Clicks" Daily Activity     Outcome Measure Help from another person eating meals?: None Help from another person taking care of personal grooming?: A Little Help from another person toileting, which includes using toliet, bedpan, or urinal?: A Little Help from another person bathing (including washing, rinsing, drying)?: A Lot Help from another person to put on and taking off regular upper body clothing?: A Little Help from another person to put on and taking off regular lower body clothing?: A Lot 6 Click Score: 17   End of Session Equipment Utilized During Treatment: Rolling walker (2 wheels);Other (comment) (quad cane) Nurse Communication:  (NT re status)  Activity Tolerance: Patient tolerated treatment well Patient left: in chair;with call bell/phone within reach;with chair alarm set  OT Visit Diagnosis: Other abnormalities of gait and mobility (R26.89);Muscle weakness (generalized) (M62.81);Hemiplegia and hemiparesis Hemiplegia -  Right/Left: Left Hemiplegia - dominant/non-dominant: Non-Dominant Hemiplegia - caused by: Cerebral infarction                Time: 1610-9604 OT Time Calculation (min): 22 min Charges:  OT General Charges $OT Visit: 1 Visit OT Evaluation $OT Eval Moderate Complexity: 1 Mod  Gerre Kraft, OTD OTR/L  06/19/23, 10:46 AM

## 2023-06-19 NOTE — Progress Notes (Signed)
*  PRELIMINARY RESULTS* Echocardiogram 2D Echocardiogram has been performed.  Broadus Canes 06/19/2023, 9:59 AM

## 2023-06-19 NOTE — Progress Notes (Addendum)
 Progress Note   Patient: Susan Farrell:[id number] DOB: 31-Aug-1945 DOA: 06/18/2023     0 DOS: the patient was seen and examined on 06/19/2023   Brief hospital course: Ms. Gracyn Allor is a 78 year old female with history of hypertension, history of prior CVA in 2023, hypothyroid, hyperlipidemia, who presents to emergency department for chief concerns of headache and left hand numbness for 2 days.  No history of head trauma or fall.  Vitals in the ED showed T of 98.4, rr 20, hr of 89, blood pressure 116/81, SpO2 of 94% on room air.  Serum sodium is 137, potassium 4.0, chloride 105, bicarb 22, BUN of 22, serum creatinine of 0.97, EGFR greater than 60, nonfasting blood glucose 93, WBC 6.2, hemoglobin 10, platelets of 220.  CT head showed subdural hematomas and MRI positive for a small right parietal lobe stroke for which neurology was consulted.  ED treatment: None. EDP discussed with Dr. Jeris Montes, who recommends a repeat CT imaging in 6 hours, repeat imaging seems stable.  5/30: Vital and labs stable, lipid panel normal with HDL of 33 and LDL of 50 CTA head and neck was negative for any LVO and mild atherosclerosis. INR and APTT normal.  Hepatic function normal.  A1c of 5.4. Echocardiogram with normal EF, grade systolic dysfunction and no other significant abnormality.  No intra-atrial shunt noted. PT/OT is recommending CIR No aspirin  on Plavix  and patient will follow-up with neurosurgery as outpatient for a repeat CT head and if stable then they can decide about starting 1 antiplatelet.  Assessment and Plan: * Subdural hematoma (HCC) Repeat CT with stable findings, seems like having subacute and acute hematomas. Neuro surgery is on board-no need for any surgical intervention Goal systolic is between 120-160 Outpatient follow-up with neurosurgery in 2 weeks for repeat CT head No antiplatelets for now and neurosurgery will resume as appropriate Hydralazine  5 mg IV every 4 hours as  needed for SBP greater 160, 5 days ordered  Acute stroke due to ischemia [facility]) Right parietal stroke on MRI Completed the stroke workup. PT and OT are recommending CIR No antiplatelets until cleared from neurosurgery Continue with statin  History of stroke In 2023 with left persistent lower extremity weakness and swelling of the left lower extremity that is baseline Patient currently on on aspirin  and Plavix , keep holding them  Anemia of chronic disease At baseline  Hypothyroidism Home levothyroxine  100 mcg daily resumed  HTN (hypertension) Currently blood pressure within goal. Goal blood pressure is between 120-160 systolic -We will continue home medications once med rec is complete -Continue with as needed hydralazine   Dyslipidemia Home rosuvastatin  40 mg nightly resumed  Tobacco abuse Needed nicotine patch ordered   Subjective: Patient was sitting comfortably in chair when seen today.  Continues to have some left arm numbness, clumsiness seems improving.  Physical Exam: Vitals:   06/18/23 2329 06/19/23 0419 06/19/23 0747 06/19/23 1105  BP: (!) 140/63 (!) 148/69 124/65 136/60  Pulse: 94 88 82 81  Resp: 18  16   Temp: 98.3 F (36.8 C) 98.7 F (37.1 C) 98.7 F (37.1 C) 100.2 F (37.9 C)  TempSrc:  Oral Oral Oral  SpO2: 96% 100% 99% 100%   General.  Frail elderly lady, in no acute distress. Pulmonary.  Lungs clear bilaterally, normal respiratory effort. CV.  Regular rate and rhythm, no JVD, rub or murmur. Abdomen.  Soft, nontender, nondistended, BS positive. CNS.  Alert and oriented .  Chronic weakness of left lower extremity Extremities.  No edema, no cyanosis, pulses intact and symmetrical. Psychiatry.  Judgment and insight appears normal.   Data Reviewed: Prior data reviewed  Family Communication: Tried calling husband with no response  Disposition: Status is: Observation The patient will require care spanning > 2 midnights and should be moved to  inpatient because: Severity of illness  Planned Discharge Destination: Rehab  DVT prophylaxis.  SCDs Time spent: 50 minutes  This record has been created using Conservation officer, historic buildings. Errors have been sought and corrected,but may not always be located. Such creation errors do not reflect on the standard of care.   Author: Luna Salinas, MD 06/19/2023 12:35 PM  For on call review [email or web address].

## 2023-06-19 NOTE — Consult Note (Signed)
 Consult requested by:  Dr. Vicenta Farrell  Consult requested for:  Subdural hematoma  Primary Physician:  Susan Baumgarten, MD  History of Present Illness: 06/19/2023 [name] is here today with a chief complaint of left hand numbness that started 2 days ago.  She has history of stroke.  She was brought into the emergency department for evaluation, where CT scan showed subdural hematomas.  I was consulted for management recommendations.  During workup, she was noted to have left hand numbness.  An MRI was performed which showed a small stroke.  Neurology was also consulted for management recommendations for her stroke.  Currently, she has no complaints other than continued numbness in her left hand.  I have utilized the care everywhere function in epic to review the outside records available from external health systems.  Review of Systems:  A 10 point review of systems is negative, except for the pertinent positives and negatives detailed in the HPI.  Past Medical History: Past Medical History:  Diagnosis Date   Hypertension    Stroke [facility])     Past Surgical History: Past Surgical History:  Procedure Laterality Date   ABDOMINAL HYSTERECTOMY     CHOLECYSTECTOMY     ENDOVASCULAR REPAIR/STENT Farrell N/A 06/12/2020   Procedure: ENDOVASCULAR REPAIR/STENT Farrell;  Surgeon: Susan Mass, MD;  Location: [facility] INVASIVE CV LAB;  Service: Cardiovascular;  Laterality: N/A;    Allergies: Allergies as of 06/18/2023   (No Known Allergies)    Medications: Current Meds  Medication Sig   ciprofloxacin (CIPRO) 500 MG tablet Take 500 mg by mouth 2 (two) times daily.   clopidogrel  (PLAVIX ) 75 MG tablet Take 75 mg by mouth daily.   nitrofurantoin, macrocrystal-monohydrate, (MACROBID) 100 MG capsule Take 100 mg by mouth 2 (two) times daily.    Social History: Social History   Tobacco Use   Smoking status: Every Day    Types: Cigarettes   Smokeless tobacco: Current   Vaping Use   Vaping status: Never Used  Substance Use Topics   Alcohol  use: Not Currently   Drug use: Never    Family Medical History: Family History  Problem Relation Age of Onset   Hypertension Mother    Hypertension Father    Diabetes Sister    Breast cancer Neg Hx     Physical Examination: Vitals:   06/18/23 2329 06/19/23 0419  BP: (!) 140/63 (!) 148/69  Pulse: 94 88  Resp: 18   Temp: 98.3 F (36.8 C) 98.7 F (37.1 C)  SpO2: 96% 100%    General: Patient is in no apparent distress. Attention to examination is appropriate.  Neck:   Supple.  Full range of motion.  Respiratory: Patient is breathing without any difficulty.   NEUROLOGICAL:     Awake, alert, oriented to person, place, and time.  Speech is clear and fluent.  Cranial Nerves: Pupils equal round and reactive to light.  Facial tone is symmetric.  Facial sensation is symmetric. Shoulder shrug is symmetric. Tongue protrusion is midline.  There is no pronator drift.  Strength: Side Biceps Triceps Deltoid Interossei Grip Wrist Ext. Wrist Flex.  R 5 5 5 5 5 5 5   L 5 5 5 5 5 5 5    Side Iliopsoas Quads Hamstring PF DF EHL  R 5 5 5 5 5 5   L 3 3 3 3 3 3     Bilateral upper and lower extremity sensation is intact to light touch with exception of L distal UE, which  is diminished.    No evidence of dysmetria noted.  Gait is untested.     Medical Decision Making  Imaging: CT Head 06/18/2023 IMPRESSION: 1. Positive for mixed density bilateral Subdural Hematomas, new since 09/09/2022. 2. Left side SDH is larger but more localized to the superior convexity, up to 10 mm in thickness. 3. Right SDH more generalized, 5 mm. 4. Mild associated intracranial Farrell effect with no significant midline shift. No skull fracture identified. 5. 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   Electronically Signed: By: Susan Farrell M.D. On: 06/18/2023 10:42  MR Brain 06/18/2023  IMPRESSION: 1. 3 mm  acute/subacute infarct within the right parietal lobe. 2. Subdural hematomas along the bilateral cerebral convexities, unchanged in size from the head CT performed earlier today (measuring up to 5 mm in thickness on the right and up to 10 mm in thickness on the left). No midline shift. 3. Background parenchymal atrophy, chronic small vessel ischemic disease and chronic infarcts, as described.     Electronically Signed   By: Susan Farrell D.O.   On: 06/18/2023 14:25  I have personally reviewed the images and agree with the above interpretation.  Assessment and Plan: [name] is a pleasant 78 y.o. female with moderately sized subdural hematomas bilaterally.  Both of these collections have areas of T1 and T2 bright and dark.  Based on that appearance, as well as the appearance of the CT scan, I suspect that these are subacute collections.  I would not recommend surgical intervention at this time.  Please hold aspirin  and Plavix  for now.  Will plan to repeat her scan in 2 weeks in the outpatient setting.  If that scan is stable, we will restart her aspirin .  I would typically prefer to hold Plavix  for a month after this type of event to confirm stability of the hemorrhage.   I have communicated my recommendations to the requesting physician and coordinated care to facilitate these recommendations.     Susan Glassco K. Susan Antis MD, [facility] Neurosurgery

## 2023-06-19 NOTE — Care Management Obs Status (Signed)
 MEDICARE OBSERVATION STATUS NOTIFICATION   Patient Details  Name: CIONNA COLLANTES MRN: [id number] Date of Birth: 11/19/1945   Medicare Observation Status Notification Given:  Yes    Anise Kerns 06/19/2023, 12:24 PM

## 2023-06-19 NOTE — Assessment & Plan Note (Signed)
 Currently blood pressure within goal. Goal blood pressure is between 120-160 systolic -We will continue home medications once med rec is complete -Continue with as needed hydralazine 

## 2023-06-19 NOTE — Evaluation (Signed)
 Physical Therapy Evaluation Patient Details Name: Susan Farrell MRN: [id number] DOB: October 07, 1945 Today's Date: 06/19/2023  History of Present Illness  Pt is a 78 y.o. female presenting to hospital 06/18/23 with c/o L hand numbness/impaired grip; mild gradual onset HA past 2 days.  Imaging showing mixed density B SDH's (no midline shift) and 3 mm acute/subacute infarct within the R parietal lobe.  Pt admitted with SDH and acute stroke d/t ischemia.  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  Clinical Impression  Prior to recent medical concerns, pt reports being ambulatory with quad cane; lives with her husband.  Currently pt is mod assist to stand with quad cane but min assist to stand with RW; mod assist to ambulate 6 feet with quad cane but improved to min assist to ambulate 40 feet with RW use.  Pt demonstrating impaired strength, balance, and gait impairments (pt reports more difficulty with balance and picking L leg up to walk compared to baseline).  Pt would currently benefit from skilled PT to address noted impairments and functional limitations (see below for any additional details).  Upon hospital discharge, pt would benefit from ongoing therapy.     If plan is discharge home, recommend the following: A lot of help with walking and/or transfers;A little help with bathing/dressing/bathroom;Assistance with cooking/housework;Assist for transportation;Help with stairs or ramp for entrance   Can travel by private vehicle        Equipment Recommendations Rolling walker (2 wheels);BSC/3in1  Recommendations for Other Services       Functional Status Assessment Patient has had a recent decline in their functional status and demonstrates the ability to make significant improvements in function in a reasonable and predictable amount of time.     Precautions / Restrictions  Precautions Precautions: Fall Recall of Precautions/Restrictions: Impaired Precaution/Restrictions Comments: SBP 120-160 Restrictions Weight Bearing Restrictions Per Provider Order: No      Mobility  Bed Mobility               General bed mobility comments: Deferred (pt in recliner beginning/end of session at rest)    Transfers Overall transfer level: Needs assistance Equipment used: Quad cane, Rolling walker (2 wheels) Transfers: Sit to/from Stand Sit to Stand: Min assist, Mod assist           General transfer comment: mod assist to stand with quad cane; min assist to stand with RW use    Ambulation/Gait Ambulation/Gait assistance: Mod assist, Min assist Gait Distance (Feet):  (6 feet with quad cane use; 40 feet with RW use) Assistive device: Quad cane, Rolling walker (2 wheels)   Gait velocity: decreased     General Gait Details: pt unsteady and requiring mod assist for balance ambulating with quad cane use (L UE support provided)  x6 feet; min assist to ambulate 40 feet with RW use (assist to steady; initially L side of walker intermittently slightly coming off floor); decreased L LE foot clearance (pt tending to externally rotate, slide forward on floor, and circumduct L LE)  Stairs            Wheelchair Mobility     Tilt Bed    Modified Rankin (Stroke Patients Only)       Balance Overall balance assessment: Needs assistance Sitting-balance support: No upper extremity supported, Feet supported Sitting balance-Leahy Scale: Good Sitting balance - Comments: steady reaching within BOS with R UE   Standing balance  support: Single extremity supported Standing balance-Leahy Scale: Fair Standing balance comment: steady static standing with single UE support                             Pertinent Vitals/Pain Pain Assessment Pain Assessment: 0-10 Pain Score: 7  Pain Location: headache Pain Descriptors / Indicators: Headache Pain  Intervention(s): Limited activity within patient's tolerance, Monitored during session, Repositioned, RN gave pain meds during session    Home Living Family/patient expects to be discharged to:: Private residence Living Arrangements: Spouse/significant other Available Help at Discharge: Family;Available 24 hours/day Type of Home: House Home Access: Stairs to enter Entrance Stairs-Rails: None Entrance Stairs-Number of Steps: 2 in front and back   Home Layout: One level Home Equipment: Shower seat - built [employer] fundraiser (2 wheels);Cane - quad;Rollator (4 wheels)      Prior Function Prior Level of Function : Independent/Modified Independent;History of Falls (last six months)             Mobility Comments: Pt reports she was ambulatory with quad cane until about a week ago; per outpatient PT notes in 11/2022 pt was ambulating over 200' with rollator;reports a "slip" a few weeks ago ADLs Comments: Pt reports MOD I-I with ADL/IADL; husband reports she was able to tie her shoes and she was unable to perform this task this week.     Extremity/Trunk Assessment   Upper Extremity Assessment Upper Extremity Assessment: Defer to OT evaluation LUE Coordination: decreased fine motor;decreased gross motor    Lower Extremity Assessment Lower Extremity Assessment: LLE deficits/detail (4+/5 R hip flexion, knee flexion/extension, and DF) LLE Deficits / Details: hip flexion 2+/5; knee extension 4+/5; knee flexion 4-/5; DF 2+/5 LLE Sensation: decreased light touch    Cervical / Trunk Assessment Cervical / Trunk Assessment: Normal  Communication   Communication Communication: No apparent difficulties    Cognition Arousal: Alert Behavior During Therapy: WFL for tasks assessed/performed   PT - Cognitive impairments: No apparent impairments                         Following commands: Impaired Following commands impaired: Follows one step commands with increased time      Cueing Cueing Techniques: Verbal cues, Gestural cues, Tactile cues, Visual cues     General Comments General comments (skin integrity, edema, etc.): BP 128/72 (MAP 85) with HR 82 bpm sitting in recliner beginning of session; BP 103/63 (MAP 73) with HR 84 bpm post ambulation (in sitting); pt reclined in recliner with LE's elevated and BP improved to 115/59 (MAP 76) with HR 82 bpm--nurse notified of pt's BP.  Nursing cleared pt for participation in physical therapy.  Pt agreeable to PT session.    Exercises     Assessment/Plan    PT Assessment Patient needs continued PT services  PT Problem List Decreased strength;Decreased balance;Decreased mobility;Decreased coordination;Decreased knowledge of precautions;Pain       PT Treatment Interventions DME instruction;Gait training;Stair training;Functional mobility training;Therapeutic activities;Therapeutic exercise;Balance training;Patient/family education    PT Goals (Current goals can be found in the Care Plan section)  Acute Rehab PT Goals Patient Stated Goal: to improve strength and walking PT Goal Formulation: With patient Time For Goal Achievement: 07/03/23 Potential to Achieve Goals: Good    Frequency Min 3X/week     Co-evaluation               AM-PAC PT "6 Clicks" Mobility  Outcome  Measure Help needed turning from your back to your side while in a flat bed without using bedrails?: None Help needed moving from lying on your back to sitting on the side of a flat bed without using bedrails?: A Little Help needed moving to and from a bed to a chair (including a wheelchair)?: A Little Help needed standing up from a chair using your arms (e.g., wheelchair or bedside chair)?: A Little Help needed to walk in hospital room?: A Little Help needed climbing 3-5 steps with a railing? : A Lot 6 Click Score: 18    End of Session Equipment Utilized During Treatment: Gait belt Activity Tolerance: Patient tolerated treatment  well Patient left: in chair;with call bell/phone within reach;with chair alarm set;Other (comment) (B LE's elevated via pillows) Nurse Communication: Mobility status;Precautions;Other (comment) (Pt's BP) PT Visit Diagnosis: Unsteadiness on feet (R26.81);Other abnormalities of gait and mobility (R26.89);Muscle weakness (generalized) (M62.81);Hemiplegia and hemiparesis Hemiplegia - Right/Left: Left Hemiplegia - caused by: Cerebral infarction    Time: 1030-1100 PT Time Calculation (min) (ACUTE ONLY): 30 min   Charges:   PT Evaluation $PT Eval Low Complexity: 1 Low PT Treatments $Gait Training: 8-22 mins PT General Charges $$ ACUTE PT VISIT: 1 Visit        Amador Junes, PT 06/19/23, 1:46 PM

## 2023-06-19 NOTE — Progress Notes (Addendum)
 NEUROLOGY CONSULT FOLLOW UP NOTE   Date of service: Jun 19, 2023 Patient Name: Susan Farrell MRN:  [id number] DOB:  [birth year]  Interval Hx/subjective   Seen and examined reports improvement in symptoms Vitals   Vitals:   06/18/23 2329 06/19/23 0419 06/19/23 0747 06/19/23 1105  BP: (!) 140/63 (!) 148/69 124/65 136/60  Pulse: 94 88 82 81  Resp: 18  16   Temp: 98.3 F (36.8 C) 98.7 F (37.1 C) 98.7 F (37.1 C) 100.2 F (37.9 C)  TempSrc:  Oral Oral Oral  SpO2: 96% 100% 99% 100%     There is no height or weight on file to calculate BMI.  Physical Exam  General: Very pleasant woman in no acute distress. HEENT: Normocephalic atraumatic Lungs clear Cardiovascular regular rate rhythm Neurological exam Awake alert oriented x 3 No dysarthria Aphasia Cranial nerves II to XII intact Motor examination with left lower extremity barely 3/5-this is from prior stroke. Sensation grossly intact with the exception of left upper extremity.  There is also slowness of finger taps on the left hand. No dysmetria Gait testing deferred  Medications  Current Facility-Administered Medications:     stroke: early stages of recovery book, , Does not apply, Once, Cox, Amy N, DO   acetaminophen  (TYLENOL ) tablet 650 mg, 650 mg, Oral, Q6H PRN, 650 mg at 06/19/23 1038 **OR** acetaminophen  (TYLENOL ) suppository 650 mg, 650 mg, Rectal, Q6H PRN, Cox, Amy N, DO   hydrALAZINE  (APRESOLINE ) injection 5 mg, 5 mg, Intravenous, Q4H PRN, Cox, Amy N, DO   levothyroxine  (SYNTHROID ) tablet 100 mcg, 100 mcg, Oral, Q0600, Cox, Amy N, DO, 100 mcg at 06/19/23 4098   nicotine (NICODERM CQ - dosed in mg/24 hours) patch 21 mg, 21 mg, Transdermal, Daily PRN, Cox, Amy N, DO   ondansetron  (ZOFRAN ) tablet 4 mg, 4 mg, Oral, Q6H PRN **OR** ondansetron  (ZOFRAN ) injection 4 mg, 4 mg, Intravenous, Q6H PRN, Cox, Amy N, DO   rosuvastatin  (CRESTOR ) tablet 40 mg, 40 mg, Oral, QHS, Cox, Amy N, DO, 40 mg at 06/18/23 2133    senna-docusate (Senokot-S) tablet 1 tablet, 1 tablet, Oral, QHS PRN, Cox, Amy N, DO  Labs and Diagnostic Imaging   CBC:  Recent Labs  Lab 06/18/23 1010 06/19/23 0611  WBC 6.2 5.8  NEUTROABS 4.0  --   HGB 10.0* 9.8*  HCT 32.0* 29.5*  MCV 73.1* 71.1*  PLT 220 206    Basic Metabolic Panel:  Lab Results  Component Value Date   NA 137 06/19/2023   K 3.8 06/19/2023   CO2 23 06/19/2023   GLUCOSE 99 06/19/2023   BUN 18 06/19/2023   CREATININE 0.84 06/19/2023   CALCIUM  9.0 06/19/2023   GFRNONAA >60 06/19/2023   Lipid Panel:  Lab Results  Component Value Date   LDLCALC 50 06/19/2023   HgbA1c:  Lab Results  Component Value Date   HGBA1C 6.3 (H) 09/09/2022   Urine Drug Screen:     Component Value Date/Time   LABOPIA NONE DETECTED 08/28/2021 2119   COCAINSCRNUR NONE DETECTED 08/28/2021 2119   LABBENZ NONE DETECTED 08/28/2021 2119   AMPHETMU NONE DETECTED 08/28/2021 2119   THCU NONE DETECTED 08/28/2021 2119   LABBARB NONE DETECTED 08/28/2021 2119    Alcohol  Level     Component Value Date/Time   ETH <10 08/28/2021 2018   INR  Lab Results  Component Value Date   INR 1.1 06/18/2023   APTT  Lab Results  Component Value Date   APTT 27 06/18/2023  CT Head without contrast(Personally reviewed): Mixed density bilateral subdural hematomas-new from August 2024.  CT angio head and neck personally reviewed-mild atherosclerosis in the head and neck without LVO.   MRI Brain(Personally reviewed): 3 mm acute/subacute infarct within the right parietal lobe. Subdural hematomas along the bilateral cerebral convexities unchanged in size from the head CT performed earlier in the day-measuring up to 5 mm in thickness on the right and up to 10 mm in thickness on the left.  No midline shift.  Background parenchymal atrophy, small vessel disease.  Chronic infarcts-right corona radiata, right basal ganglia.  Chronic, send and deposition associated with chronic right basal ganglia  lacunar infarction  2D echo-pending  Assessment   Susan Farrell is a 78 y.o. female past medical history of hypertension, discoid lupus, prior strokes with residual left-sided lower extremity weakness from right basal ganglia stroke of August 2023, hypertension, hyperlipidemia, hypothyroidism presented for evaluation of left hand numbness. MRI reveals a punctate right parietal stroke. CT and MRI also revealed subdural hematomas along bilateral cerebral convexities which have remained stable on the 2 exams-CT head and MRI head. Mixed density blood there suggest a chronic process. Neurosurgery consulted-appreciate recommendations.  Impression: Acute ischemic stroke-cryptogenic Subdural hematomas bilaterally  Recommendations  Continue frequent neurochecks No antiplatelets or anticoagulants Neurosurgery recommends outpatient CT head in 2 weeks and if bleed is stable, can start aspirin  then.  Will continue to hold off Plavix  at least for a month per neurosurgery recommendations. Blood pressure goal systolic 120-160. 2D echo pending A1c-pending.  Goal less than 7-management per primary team LDL 50.  Goal less than 70.  At goal.  On home rosuvastatin -continue for now. Continue therapy assessments I will follow-up on the 2D echo findings.  No further inpatient workup planned as of yet. Will update the primary team once those results are available. ______________________________________________________________________   Signed, Tona Francis, MD [facility]    Addendum Echocardiogram unremarkable Stroke etiology likely small vessel disease due to risk factors. Outpatient neurology follow-up in 8 to 12 weeks Neurosurgery follow-up in the next couple of weeks to follow-up on imaging. No antiplatelets or anticoagulants for now until bleeds are determined to be stable by neurosurgery. Plan discussed with Dr. Ariel Begun  -- Tona Francis, MD Neurologist [facility]

## 2023-06-19 NOTE — Progress Notes (Signed)
*  PRELIMINARY RESULTS* Echocardiogram 2D Echocardiogram has been performed.  Susan Farrell 06/19/2023, 9:59 AM

## 2023-06-20 DIAGNOSIS — E039 Hypothyroidism, unspecified: Secondary | ICD-10-CM | POA: Diagnosis not present

## 2023-06-20 DIAGNOSIS — E785 Hyperlipidemia, unspecified: Secondary | ICD-10-CM | POA: Diagnosis not present

## 2023-06-20 DIAGNOSIS — S065XAA Traumatic subdural hemorrhage with loss of consciousness status unknown, initial encounter: Secondary | ICD-10-CM | POA: Diagnosis not present

## 2023-06-20 DIAGNOSIS — I6200 Nontraumatic subdural hemorrhage, unspecified: Secondary | ICD-10-CM | POA: Diagnosis not present

## 2023-06-20 DIAGNOSIS — R2689 Other abnormalities of gait and mobility: Secondary | ICD-10-CM | POA: Diagnosis not present

## 2023-06-20 DIAGNOSIS — I6389 Other cerebral infarction: Secondary | ICD-10-CM | POA: Diagnosis not present

## 2023-06-20 DIAGNOSIS — I63521 Cerebral infarction due to unspecified occlusion or stenosis of right anterior cerebral artery: Secondary | ICD-10-CM | POA: Diagnosis not present

## 2023-06-20 DIAGNOSIS — I10 Essential (primary) hypertension: Secondary | ICD-10-CM | POA: Diagnosis not present

## 2023-06-20 DIAGNOSIS — Z9889 Other specified postprocedural states: Secondary | ICD-10-CM

## 2023-06-20 DIAGNOSIS — Z720 Tobacco use: Secondary | ICD-10-CM

## 2023-06-20 DIAGNOSIS — D638 Anemia in other chronic diseases classified elsewhere: Secondary | ICD-10-CM

## 2023-06-20 DIAGNOSIS — I701 Atherosclerosis of renal artery: Secondary | ICD-10-CM | POA: Insufficient documentation

## 2023-06-20 DIAGNOSIS — Z8673 Personal history of transient ischemic attack (TIA), and cerebral infarction without residual deficits: Secondary | ICD-10-CM

## 2023-06-20 DIAGNOSIS — I639 Cerebral infarction, unspecified: Secondary | ICD-10-CM | POA: Diagnosis not present

## 2023-06-20 DIAGNOSIS — F17210 Nicotine dependence, cigarettes, uncomplicated: Secondary | ICD-10-CM | POA: Diagnosis not present

## 2023-06-20 DIAGNOSIS — Z7902 Long term (current) use of antithrombotics/antiplatelets: Secondary | ICD-10-CM | POA: Diagnosis not present

## 2023-06-20 MED ORDER — ROSUVASTATIN CALCIUM 40 MG PO TABS: 40.0000 mg | ORAL_TABLET | Freq: Every day | ORAL | 1 refills | Status: AC

## 2023-06-20 NOTE — TOC Transition Note (Signed)
 Transition of Care [facility]) - Discharge Note   Patient Details  Name: Susan Farrell MRN: [id number] Date of Birth: 1945-10-16  [facility] CM/SW Contact:  Alexandra Ice, RN Phone Number: 06/20/2023, 1:56 PM   Clinical Narrative:     Patient wants to go home with home health services. Sent referral to [facility] with [facility], seeing if they are able to accept patient, awaiting response.  Final next level of care: Home w Home Health Services Barriers to Discharge: Barriers Resolved   Patient Goals and CMS Choice Patient states their goals for this hospitalization and ongoing recovery are:: I want to go home CMS Medicare.gov Compare Post Acute Care list provided to:: Patient Choice offered to / list presented to : Patient      Discharge Placement                  Name of family member notified: Currie Douse Patient and family notified of of transfer: 06/20/23  Discharge Plan and Services Additional resources added to the After Visit Summary for                    DME Agency: NA       HH Arranged: PT, OT HH Agency: [facility] Date [facility] Agency Contacted: 06/20/23 Time HH Agency Contacted: [phone] Representative spoke with at [facility] Agency: Randel Buss  Social Drivers of Health (SDOH) Interventions SDOH Screenings   Food Insecurity: No Food Insecurity (06/18/2023)  Housing: Low Risk  (06/18/2023)  Transportation Needs: No Transportation Needs (06/18/2023)  Utilities: Not At Risk (06/18/2023)  Depression (PHQ2-9): Low Risk  (02/13/2023)  Financial Resource Strain: Low Risk  (06/18/2023)   Received from [facility]  Physical Activity: Insufficiently Active (05/12/2019)   Received from [facility], [facility]  Social Connections: Unknown (06/18/2023)  Stress: No Stress Concern Present (04/21/2022)  Tobacco Use: High Risk (06/18/2023)     Readmission Risk Interventions     No data to display

## 2023-06-20 NOTE — Discharge Summary (Signed)
 Physician Discharge Summary   Patient: Susan Farrell MRN: [id number] DOB: Jun 19, 1945  Admit date:     06/18/2023  Discharge date: 06/20/23  Discharge Physician: Luna Salinas   PCP: Antonio Baumgarten, MD   Recommendations at discharge:  Please obtain CBC and BMP and follow-up No antiplatelet therapy until cleared from neurosurgery Follow-up with neurosurgery Follow-up with outpatient neurology Follow-up with primary care provider  Discharge Diagnoses: Principal Problem:   Subdural hematoma (HCC) Active Problems:   Acute CVA (cerebrovascular accident) (HCC)   Acute stroke due to ischemia (HCC)   History of stroke   Anemia of chronic disease   Hypothyroidism   HTN (hypertension)   Dyslipidemia   Tobacco abuse   History of abdominal aortic aneurysm (AAA) repair   Hospital Course: Susan Farrell is a 78 year old female with history of hypertension, history of prior CVA in 2023, hypothyroid, hyperlipidemia, who presents to emergency department for chief concerns of headache and left hand numbness for 2 days.  No history of head trauma or fall.  Vitals in the ED showed T of 98.4, rr 20, hr of 89, blood pressure 116/81, SpO2 of 94% on room air.  Serum sodium is 137, potassium 4.0, chloride 105, bicarb 22, BUN of 22, serum creatinine of 0.97, EGFR greater than 60, nonfasting blood glucose 93, WBC 6.2, hemoglobin 10, platelets of 220.  CT head showed subdural hematomas and MRI positive for a small right parietal lobe stroke for which neurology was consulted.  ED treatment: None. EDP discussed with Dr. Jeris Montes, who recommends a repeat CT imaging in 6 hours, repeat imaging seems stable.  5/30: Vital and labs stable, lipid panel normal with HDL of 33 and LDL of 50 CTA head and neck was negative for any LVO and mild atherosclerosis. INR and APTT normal.  Hepatic function normal.  A1c of 5.4. Echocardiogram with normal EF, grade systolic dysfunction and no other significant  abnormality.  No intra-atrial shunt noted. PT/OT is recommending CIR No aspirin  on Plavix  and patient will follow-up with neurosurgery as outpatient for a repeat CT head and if stable then they can decide about starting 1 antiplatelet.  5/31: Remained hemodynamically stable.  Left upper extremity numbness resolved.  Patient thinks that she is at baseline.  She was a good candidate for CIR but later declined stating that she wants to go home with home health.  She is being discharged.  Home health orders were placed.  Her home aspirin  and Plavix  has been discontinued, she need to follow-up with neurosurgery in 2 weeks and they can decide when to restart any antiplatelet.  She will continue the rest of her home medications and follow-up with her providers for further assistance.  Assessment and Plan: * Subdural hematoma (HCC) Repeat CT with stable findings, seems like having subacute and acute hematomas. Neuro surgery is on board-no need for any surgical intervention Goal systolic is between 120-160 Outpatient follow-up with neurosurgery in 2 weeks for repeat CT head No antiplatelets for now and neurosurgery will resume as appropriate Hydralazine  5 mg IV every 4 hours as needed for SBP greater 160, 5 days ordered  Acute stroke due to ischemia [facility]) Right parietal stroke on MRI Completed the stroke workup. PT and OT are recommending CIR-patient decided to go home with home health No antiplatelets until cleared from neurosurgery Continue with statin  History of stroke In 2023 with left persistent lower extremity weakness and swelling of the left lower extremity that is baseline Patient currently on on aspirin   and Plavix , keep holding them  Anemia of chronic disease At baseline  Hypothyroidism Home levothyroxine  100 mcg daily resumed  HTN (hypertension) Currently blood pressure within goal. Goal blood pressure is between 120-160 systolic -We will continue home medications once med  rec is complete -Continue with as needed hydralazine   Dyslipidemia Home rosuvastatin  40 mg nightly resumed  Tobacco abuse Needed nicotine  patch ordered  Consultants: Neurology.  Neurosurgery Procedures performed: None Disposition: Home health Diet recommendation:  Discharge Diet Orders (From admission, onward)     Start     Ordered   06/20/23 0000  Diet - low sodium heart healthy        06/20/23 1404           Cardiac diet DISCHARGE MEDICATION: Allergies as of 06/20/2023   No Known Allergies      Medication List     STOP taking these medications    aspirin  EC 81 MG tablet   clopidogrel  75 MG tablet Commonly known as: PLAVIX        TAKE these medications    acetaminophen  325 MG tablet Commonly known as: TYLENOL  Take 2 tablets (650 mg total) by mouth every 4 (four) hours as needed for mild pain (or temp > 37.5 C (99.5 F)).   amLODipine  5 MG tablet Commonly known as: NORVASC  Take 5 mg by mouth daily.   Calcium  Carbonate-Vitamin D  600-200 MG-UNIT Tabs Take 1 tablet by mouth daily.   clobetasol  cream 0.05 % Commonly known as: TEMOVATE  Apply topically 2 (two) times daily as needed.   docusate sodium  100 MG capsule Commonly known as: COLACE Take 1 capsule (100 mg total) by mouth daily.   iron  polysaccharides 150 MG capsule Commonly known as: Ferrex 150 Take 1 capsule (150 mg total) by mouth daily.   levothyroxine  88 MCG tablet Commonly known as: SYNTHROID  Take 88 mcg by mouth daily before breakfast.   metoprolol  succinate 50 MG 24 hr tablet Commonly known as: TOPROL -XL Take 1 tablet (50 mg total) by mouth daily.   rosuvastatin  40 MG tablet Commonly known as: CRESTOR  Take 1 tablet (40 mg total) by mouth daily.   WOMENS MULTIVITAMIN + COLLAGEN PO Take 1 tablet by mouth daily.        Follow-up Information     Jodeen Munch, MD. Schedule an appointment as soon as possible for a visit .   Specialty: Neurosurgery Contact  information: [address] [place] [address] [phone]         Antonio Baumgarten, MD. Schedule an appointment as soon as possible for a visit in 1 week(s).   Specialty: Internal Medicine Contact information: [address] [place] [address] [phone]                Discharge Exam: There were no vitals filed for this visit. General.  Frail elderly lady, in no acute distress. Pulmonary.  Lungs clear bilaterally, normal respiratory effort. CV.  Regular rate and rhythm, no JVD, rub or murmur. Abdomen.  Soft, nontender, nondistended, BS positive. CNS.  Alert and oriented .  No new focal neurologic deficit. Extremities.  No edema,  pulses intact and symmetrical. Psychiatry.  Judgment and insight appears normal.   Condition at discharge: stable  The results of significant diagnostics from this hospitalization (including imaging, microbiology, ancillary and laboratory) are listed below for reference.   Imaging Studies: ECHOCARDIOGRAM COMPLETE Result Date: 06/19/2023    ECHOCARDIOGRAM REPORT   Patient Name:   LYANNE KATES  Date of Exam: 06/19/2023 Medical Rec #:  [id number]        Height:       66.0 in Accession #:    [id number]       Weight:       177.0 lb Date of Birth:  1945/10/16        BSA:          1.899 m Patient Age:    77 years         BP:           124/65 mmHg Patient Gender: F                HR:           82 bpm. Exam Location:  [facility] Procedure: 2D Echo, Cardiac Doppler and Saline Contrast Bubble Study (Both            Spectral and Color Flow Doppler were utilized during procedure). Indications:     Stroke I63.9  History:         Patient has prior history of Echocardiogram examinations, most                  recent 09/10/2022. Stroke; Risk Factors:Hypertension.  Sonographer:     Broadus Canes Referring Phys:  [id number] AMY N COX Diagnosing Phys: Percival Brace MD IMPRESSIONS  1. Left ventricular ejection  fraction, by estimation, is 65 to 70%. The left ventricle has normal function. The left ventricle has no regional wall motion abnormalities. Left ventricular diastolic parameters are consistent with Grade I diastolic dysfunction (impaired relaxation).  2. Right ventricular systolic function is normal. The right ventricular size is normal.  3. The mitral valve is normal in structure. Trivial mitral valve regurgitation. No evidence of mitral stenosis.  4. The aortic valve is normal in structure. Aortic valve regurgitation is not visualized. No aortic stenosis is present.  5. The inferior vena cava is normal in size with greater than 50% respiratory variability, suggesting right atrial pressure of 3 mmHg.  6. Agitated saline contrast bubble study was negative, with no evidence of any interatrial shunt. FINDINGS  Left Ventricle: Left ventricular ejection fraction, by estimation, is 65 to 70%. The left ventricle has normal function. The left ventricle has no regional wall motion abnormalities. Strain was performed and the global longitudinal strain is indeterminate. The left ventricular internal cavity size was normal in size. There is no left ventricular hypertrophy. Left ventricular diastolic parameters are consistent with Grade I diastolic dysfunction (impaired relaxation). Right Ventricle: The right ventricular size is normal. No increase in right ventricular wall thickness. Right ventricular systolic function is normal. Left Atrium: Left atrial size was normal in size. Right Atrium: Right atrial size was normal in size. Pericardium: There is no evidence of pericardial effusion. Mitral Valve: The mitral valve is normal in structure. Trivial mitral valve regurgitation. No evidence of mitral valve stenosis. MV peak gradient, 8.1 mmHg. The mean mitral valve gradient is 3.0 mmHg. Tricuspid Valve: The tricuspid valve is normal in structure. Tricuspid valve regurgitation is trivial. No evidence of tricuspid stenosis.  Aortic Valve: The aortic valve is normal in structure. Aortic valve regurgitation is not visualized. No aortic stenosis is present. Aortic valve mean gradient measures 5.0 mmHg. Aortic valve peak gradient measures 10.6 mmHg. Aortic valve area, by VTI measures 2.81 cm. Pulmonic Valve: The pulmonic valve was normal in structure. Pulmonic valve regurgitation is not visualized. No evidence of pulmonic stenosis. Aorta: The aortic root is normal in  size and structure. Venous: The inferior vena cava is normal in size with greater than 50% respiratory variability, suggesting right atrial pressure of 3 mmHg. IAS/Shunts: No atrial level shunt detected by color flow Doppler. Agitated saline contrast was given intravenously to evaluate for intracardiac shunting. Agitated saline contrast bubble study was negative, with no evidence of any interatrial shunt. Additional Comments: 3D was performed not requiring image post processing on an independent workstation and was indeterminate.  LEFT VENTRICLE PLAX 2D LVIDd:         4.20 cm   Diastology LVIDs:         2.30 cm   LV e' medial:    7.62 cm/s LV PW:         1.80 cm   LV E/e' medial:  10.9 LV IVS:        1.70 cm   LV e' lateral:   7.83 cm/s LVOT diam:     2.10 cm   LV E/e' lateral: 10.6 LV SV:         82 LV SV Index:   43 LVOT Area:     3.46 cm  RIGHT VENTRICLE RV Basal diam:  3.60 cm RV Mid diam:    2.70 cm RV S prime:     20.70 cm/s TAPSE (M-mode): 1.5 cm LEFT ATRIUM             Index        RIGHT ATRIUM           Index LA diam:        3.50 cm 1.84 cm/m   RA Area:     15.10 cm LA Vol (A2C):   51.6 ml 27.18 ml/m  RA Volume:   35.00 ml  18.43 ml/m LA Vol (A4C):   45.8 ml 24.12 ml/m LA Biplane Vol: 48.8 ml 25.70 ml/m  AORTIC VALVE AV Area (Vmax):    2.59 cm AV Area (Vmean):   2.68 cm AV Area (VTI):     2.81 cm AV Vmax:           163.00 cm/s AV Vmean:          104.000 cm/s AV VTI:            0.292 m AV Peak Grad:      10.6 mmHg AV Mean Grad:      5.0 mmHg LVOT Vmax:          122.00 cm/s LVOT Vmean:        80.500 cm/s LVOT VTI:          0.237 m LVOT/AV VTI ratio: 0.81  AORTA Ao Root diam: 3.30 cm MITRAL VALVE                TRICUSPID VALVE MV Area (PHT): 3.21 cm     TR Peak grad:   19.0 mmHg MV Area VTI:   2.67 cm     TR Vmax:        218.00 cm/s MV Peak grad:  8.1 mmHg MV Mean grad:  3.0 mmHg     SHUNTS MV Vmax:       1.42 m/s     Systemic VTI:  0.24 m MV Vmean:      81.5 cm/s    Systemic Diam: 2.10 cm MV Decel Time: 236 msec MV E velocity: 83.30 cm/s MV A velocity: 121.00 cm/s MV E/A ratio:  0.69 Percival Brace MD Electronically signed by Percival Brace MD Signature Date/Time: 06/19/2023/11:53:37 AM  Final    CT ANGIO HEAD NECK W WO CM Result Date: 06/18/2023 CLINICAL DATA:  Stroke/TIA, determine embolic source. EXAM: CT ANGIOGRAPHY HEAD AND NECK WITH AND WITHOUT CONTRAST TECHNIQUE: Multidetector CT imaging of the head and neck was performed using the standard protocol during bolus administration of intravenous contrast. Multiplanar CT image reconstructions and MIPs were obtained to evaluate the vascular anatomy. Carotid stenosis measurements (when applicable) are obtained utilizing NASCET criteria, using the distal internal carotid diameter as the denominator. RADIATION DOSE REDUCTION: This exam was performed according to the departmental dose-optimization program which includes automated exposure control, adjustment of the mA and/or kV according to patient size and/or use of iterative reconstruction technique. CONTRAST:  75mL OMNIPAQUE  IOHEXOL  350 MG/ML SOLN COMPARISON:  CTA head and neck 09/09/2022 FINDINGS: CTA NECK FINDINGS Aortic arch: Normal variant aortic arch branching pattern with common origin of the brachiocephalic and left common carotid arteries. Mild atherosclerosis without a significant stenosis of the arch vessel origins. Right carotid system: Patent without evidence of a significant stenosis or dissection. Minimal atheromatous irregularity in the  region of the carotid bifurcation. Left carotid system: Patent without evidence of a significant stenosis or dissection. Minimal calcified plaque at the carotid bifurcation. Vertebral arteries: Patent and codominant without evidence of stenosis or dissection. Skeleton: Cervical spondylosis most notable at C3-4 and C4-5. Other neck: No evidence of cervical lymphadenopathy or mass. Upper chest: Clear lung apices. Review of the MIP images confirms the above findings CTA HEAD FINDINGS Anterior circulation: The internal carotid arteries are patent from skull base to carotid termini with mild atherosclerosis not resulting in a significant stenosis. ACAs and MCAs are patent with mild branch vessel irregularity but no evidence of a proximal branch occlusion or significant proximal stenosis. No aneurysm is identified. Posterior circulation: The intracranial vertebral arteries are widely patent to the basilar. Patent PICA and SCA origins are visualized bilaterally. The basilar artery is widely patent. Posterior communicating arteries are diminutive or absent. Both PCAs are patent with branch vessel irregularity and asymmetric attenuation of distal branches on the right but without evidence of a significant proximal stenosis. No aneurysm is identified. Venous sinuses: Not well evaluated due to contrast timing. Anatomic variants: None. Review of the MIP images confirms the above findings IMPRESSION: 1. Mild atherosclerosis in the head and neck without a large vessel occlusion or significant proximal stenosis. 2.  Aortic Atherosclerosis (ICD10-I70.0). Electronically Signed   By: Aundra Lee M.D.   On: 06/18/2023 19:07   MR BRAIN WO CONTRAST Result Date: 06/18/2023 CLINICAL DATA:  Provided history: Neuro deficit, acute, stroke suspected. EXAM: MRI HEAD WITHOUT CONTRAST TECHNIQUE: Multiplanar, multiecho pulse sequences of the brain and surrounding structures were obtained without intravenous contrast. COMPARISON:  Head CT  06/18/2023.  Brain MRI 09/09/2022. FINDINGS: Brain: Mild generalized cerebral atrophy. 3 mm acute/subacute infarct within the right parietal lobe at the gray-white junction (series 5, image 36) (series 7, image 13). Subdural hematomas again demonstrated along the bilateral cerebral convexities, unchanged in size. As before, the subdural hematoma on the right measures up to 5 mm in thickness and the subdural hematoma on the left measures up to 10 mm in thickness. No midline shift. Chronic lacunar infarcts again demonstrated within the right corona radiata and right basal ganglia. Chronic hemosiderin deposition associated with the chronic right basal ganglia lacunar infarct. Mild multifocal T2 FLAIR hyperintense signal abnormality elsewhere within the cerebral white matter, nonspecific but compatible with chronic small vessel ischemic disease. Known sizable chronic infarct within the right  cerebellar hemisphere (SCA territory). Additional small chronic infarcts within the bilateral cerebellar hemispheres, unchanged. Partially empty sella turcica. No evidence of an intracranial mass. Vascular: Maintained flow voids within the proximal large arterial vessels. Skull and upper cervical spine: No focal worrisome marrow lesion. Sinuses/Orbits: No mass or acute finding within the imaged orbits. No significant paranasal sinus disease. IMPRESSION: 1. 3 mm acute/subacute infarct within the right parietal lobe. 2. Subdural hematomas along the bilateral cerebral convexities, unchanged in size from the head CT performed earlier today (measuring up to 5 mm in thickness on the right and up to 10 mm in thickness on the left). No midline shift. 3. Background parenchymal atrophy, chronic small vessel ischemic disease and chronic infarcts, as described. Electronically Signed   By: Bascom Lily D.O.   On: 06/18/2023 14:25   CT HEAD WO CONTRAST Addendum Date: 06/18/2023 ADDENDUM REPORT: 06/18/2023 10:58 ADDENDUM: Study discussed by  telephone with Dr. Evan Hillock STAFFORD on 06/18/2023 at 1046 hours. Electronically Signed   By: Marlise Simpers M.D.   On: 06/18/2023 10:58   Result Date: 06/18/2023 CLINICAL DATA:  78 year old female with headache for 2 days. History of right cerebellar infarct last year. EXAM: CT HEAD WITHOUT CONTRAST TECHNIQUE: Contiguous axial images were obtained from the base of the skull through the vertex without intravenous contrast. RADIATION DOSE REDUCTION: This exam was performed according to the departmental dose-optimization program which includes automated exposure control, adjustment of the mA and/or kV according to patient size and/or use of iterative reconstruction technique. COMPARISON:  Head CT 09/08/2022, brain MRI 09/09/2022 and earlier. FINDINGS: Brain: Mixed density bilateral subdural hematoma. These appear new from the August MRI. Right side more holo hemispheric hematoma measures up to 5 mm in thickness. Left side more localized appearing superior convexity hematoma measures up to 10 mm in thickness. See coronal images 38 and 41). Mild associated intracranial mass effect with no significant midline shift (series 2, image 14 and coronal image 31). No other intracranial hemorrhage identified. Basilar cisterns remain patent. Chronic infarcts in the right cerebellum SCA territory and the right basal ganglia with encephalomalacia. Stable gray-white differentiation elsewhere. Vascular: No suspicious intracranial vascular hyperdensity. Calcified atherosclerosis at the skull base. Skull: Stable and intact.  No fracture identified. Sinuses/Orbits: Visualized paranasal sinuses and mastoids are clear. Other: No acute orbit or scalp soft tissue finding identified. IMPRESSION: 1. Positive for mixed density bilateral Subdural Hematomas, new since 09/09/2022. 2. Left side SDH is larger but more localized to the superior convexity, up to 10 mm in thickness. 3. Right SDH more generalized, 5 mm. 4. Mild associated intracranial mass  effect with no significant midline shift. No skull fracture identified. 5. 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 Electronically Signed: By: Marlise Simpers M.D. On: 06/18/2023 10:42    Microbiology: Results for orders placed or performed during the hospital encounter of 08/28/21  Resp Panel by RT-PCR (Flu A&B, Covid) Anterior Nasal Swab     Status: None   Collection Time: 08/28/21  8:18 PM   Specimen: Anterior Nasal Swab  Result Value Ref Range Status   SARS Coronavirus 2 by RT PCR NEGATIVE NEGATIVE Final    Comment: (NOTE) SARS-CoV-2 target nucleic acids are NOT DETECTED.  The SARS-CoV-2 RNA is generally detectable in upper respiratory specimens during the acute phase of infection. The lowest concentration of SARS-CoV-2 viral copies this assay can detect is 138 copies/mL. A negative result does not preclude SARS-Cov-2 infection and should not be used as the sole basis for treatment  or other patient management decisions. A negative result may occur with  improper specimen collection/handling, submission of specimen other than nasopharyngeal swab, presence of viral mutation(s) within the areas targeted by this assay, and inadequate number of viral copies(<138 copies/mL). A negative result must be combined with clinical observations, patient history, and epidemiological information. The expected result is Negative.  Fact Sheet for Patients:  BloggerCourse.com  Fact Sheet for Healthcare Providers:  SeriousBroker.it  This test is no t yet approved or cleared by the [place]  FDA and  has been authorized for detection and/or diagnosis of SARS-CoV-2 by FDA under an Emergency Use Authorization (EUA). This EUA will remain  in effect (meaning this test can be used) for the duration of the COVID-19 declaration under Section 564(b)(1) of the Act, 21 U.S.C.section 360bbb-3(b)(1), unless the authorization is terminated  or revoked  sooner.       Influenza A by PCR NEGATIVE NEGATIVE Final   Influenza B by PCR NEGATIVE NEGATIVE Final    Comment: (NOTE) The Xpert Xpress SARS-CoV-2/FLU/RSV plus assay is intended as an aid in the diagnosis of influenza from Nasopharyngeal swab specimens and should not be used as a sole basis for treatment. Nasal washings and aspirates are unacceptable for Xpert Xpress SARS-CoV-2/FLU/RSV testing.  Fact Sheet for Patients: BloggerCourse.com  Fact Sheet for Healthcare Providers: SeriousBroker.it  This test is not yet approved or cleared by the [place]  FDA and has been authorized for detection and/or diagnosis of SARS-CoV-2 by FDA under an Emergency Use Authorization (EUA). This EUA will remain in effect (meaning this test can be used) for the duration of the COVID-19 declaration under Section 564(b)(1) of the Act, 21 U.S.C. section 360bbb-3(b)(1), unless the authorization is terminated or revoked.  Performed at [facility], [address] [place], [place] [address]     Labs: CBC: Recent Labs  Lab 06/18/23 1010 06/19/23 0611  WBC 6.2 5.8  NEUTROABS 4.0  --   HGB 10.0* 9.8*  HCT 32.0* 29.5*  MCV 73.1* 71.1*  PLT 220 206   Basic Metabolic Panel: Recent Labs  Lab 06/18/23 1010 06/19/23 0611  NA 137 137  K 4.0 3.8  CL 105 105  CO2 22 23  GLUCOSE 93 99  BUN 22 18  CREATININE 0.97 0.84  CALCIUM  9.2 9.0   Liver Function Tests: Recent Labs  Lab 06/18/23 1010  AST 25  ALT 13  ALKPHOS 62  BILITOT 0.8  PROT 7.8  ALBUMIN 3.7   CBG: Recent Labs  Lab 06/18/23 0952  GLUCAP 82    Discharge time spent: greater than 30 minutes.  This record has been created using Conservation officer, historic buildings. Errors have been sought and corrected,but may not always be located. Such creation errors do not reflect on the standard of care.   Signed: Luna Salinas, MD [facility] 06/20/2023

## 2023-06-20 NOTE — TOC Progression Note (Signed)
 Transition of Care [facility]) - Progression Note    Patient Details  Name: RANDALYN AHMED MRN: [id number] Date of Birth: [birth year]  Transition [facility]) CM/SW Contact  Alexandra Ice, RN Phone Number: 06/20/2023, 2:08 PM  Clinical Narrative:     Gasper Karst able to accept patient, added information to AVS.     Barriers to Discharge: Barriers Resolved  Expected Discharge Plan and Services         Expected Discharge Date: 06/20/23                 DME Agency: NA       HH Arranged: PT, OT HH Agency: [facility] Date [facility] Agency Contacted: 06/20/23 Time HH Agency Contacted: 1355 Representative spoke with at [facility] Agency: Randel Buss   Social Determinants of Health (SDOH) Interventions SDOH Screenings   Food Insecurity: No Food Insecurity (06/18/2023)  Housing: Low Risk  (06/18/2023)  Transportation Needs: No Transportation Needs (06/18/2023)  Utilities: Not At Risk (06/18/2023)  Depression (PHQ2-9): Low Risk  (02/13/2023)  Financial Resource Strain: Low Risk  (06/18/2023)   Received from [facility]  Physical Activity: Insufficiently Active (05/12/2019)   Received from [facility]  Social Connections: Unknown (06/18/2023)  Stress: No Stress Concern Present (04/21/2022)  Tobacco Use: High Risk (06/18/2023)    Readmission Risk Interventions     No data to display

## 2023-06-20 NOTE — Progress Notes (Signed)
 At 2330, nursing tech found patients IV out. RN recommended to patient to put another one in. Patient refused stating discharge happening tomorrow.

## 2023-06-20 NOTE — Progress Notes (Addendum)
 Inpatient Rehab Admissions:  Inpatient Rehab Consult received.  I spoke with patient on the telephone for rehabilitation assessment and to discuss goals and expectations of an inpatient rehab admission.  Discussed average length of stay, insurance authorization requirement and discharge home after completion of CIR. Pt acknowledged understanding and is interested in pursuing CIR. Pt gave permission to contact husband Currie Farrell. No one answered when called. Left a message; awaiting return call. Will continue to follow.  1148: Susan returned call. Discussed CIR goals and expectations. He acknowledged understanding. He is supportive of pt pursuing CIR. He confirmed that he will be able to provide 24/7 support for pt after discharge.    1306: Notified by Dr. Ariel Begun that pt prefers to discharge home and receive HH. AC will sign off.    Signed: Artemus Larsen, MS, CCC-SLP Admissions Coordinator [phone]

## 2023-06-20 NOTE — Plan of Care (Signed)

## 2023-06-20 NOTE — Progress Notes (Unsigned)
 MRN : [id number]  Susan Farrell is a 78 y.o. (1945/06/27) female who presents with chief complaint of check circulation.  History of Present Illness:   The patient returns to the office for surveillance of an abdominal aortic aneurysm status post stent graft placement on 06/12/2020.   Procedure: Placement of a right renal artery stent with a 6 mm diameter by 37 mm length lifestream stent Placement of a 28 mm diameter proximal conformable Gore Excluder Endoprosthesis main body right with a 16 mm diameter by 14 cm length left contralateral limb Placement of a 20 mm diameter by 12 cm length right iliac extension limb  Patient denies abdominal pain or back pain, no other abdominal complaints.  No symptoms consistent with distal embolization No changes in claudication distance or new rest pain symptoms. No interval development of new ulcers or wounds  There have been no significant interval changes in his overall healthcare since his last visit.   Patient denies amaurosis fugax or TIA symptoms.  The patient denies recent episodes of angina or shortness of breath.   Duplex US  of the aorta and iliac arteries shows a *** AAA sac with *** endoleak, *** in the sac compared to the previous study.  No outpatient medications have been marked as taking for the 06/22/23 encounter (Appointment) with Prescilla Brod, Ninette Basque, MD.    Past Medical History:  Diagnosis Date   Hypertension    Stroke [facility])     Past Surgical History:  Procedure Laterality Date   ABDOMINAL HYSTERECTOMY     CHOLECYSTECTOMY     ENDOVASCULAR REPAIR/STENT GRAFT N/A 06/12/2020   Procedure: ENDOVASCULAR REPAIR/STENT GRAFT;  Surgeon: Jackquelyn Mass, MD;  Location: [facility] INVASIVE CV LAB;  Service: Cardiovascular;  Laterality: N/A;    Social History Social History   Tobacco Use   Smoking status: Every Day    Types: Cigarettes   Smokeless tobacco:  Current  Vaping Use   Vaping status: Never Used  Substance Use Topics   Alcohol  use: Not Currently   Drug use: Never    Family History Family History  Problem Relation Age of Onset   Hypertension Mother    Hypertension Father    Diabetes Sister    Breast cancer Neg Hx     No Known Allergies   REVIEW OF SYSTEMS (Negative unless checked)  Constitutional: [] Weight loss  [] Fever  [] Chills Cardiac: [] Chest pain   [] Chest pressure   [] Palpitations   [] Shortness of breath when laying flat   [] Shortness of breath with exertion. Vascular:  [x] Pain in legs with walking   [] Pain in legs at rest  [] History of DVT   [] Phlebitis   [] Swelling in legs   [] Varicose veins   [] Non-healing ulcers Pulmonary:   [] Uses home oxygen   [] Productive cough   [] Hemoptysis   [] Wheeze  [] COPD   [] Asthma Neurologic:  [] Dizziness   [] Seizures   [] History of stroke   [] History of TIA  [] Aphasia   [] Vissual changes   [] Weakness or numbness in arm   [] Weakness or numbness in leg Musculoskeletal:   [] Joint swelling   []   Joint pain   [] Low back pain Hematologic:  [] Easy bruising  [] Easy bleeding   [] Hypercoagulable state   [] Anemic Gastrointestinal:  [] Diarrhea   [] Vomiting  [] Gastroesophageal reflux/heartburn   [] Difficulty swallowing. Genitourinary:  [] Chronic kidney disease   [] Difficult urination  [] Frequent urination   [] Blood in urine Skin:  [] Rashes   [] Ulcers  Psychological:  [] History of anxiety   []  History of major depression.  Physical Examination  There were no vitals filed for this visit. There is no height or weight on file to calculate BMI. Gen: WD/WN, NAD Head: Cobb/AT, No temporalis wasting.  Ear/Nose/Throat: Hearing grossly intact, nares w/o erythema or drainage Eyes: PER, EOMI, sclera nonicteric.  Neck: Supple, no masses.  No bruit or JVD.  Pulmonary:  Good air movement, no audible wheezing, no use of accessory muscles.  Cardiac: RRR, normal S1, S2, no Murmurs. Vascular:  mild trophic  changes, no open wounds Vessel Right Left  Radial Palpable Palpable  PT Not Palpable Not Palpable  DP Not Palpable Not Palpable  Gastrointestinal: soft, non-distended. No guarding/no peritoneal signs.  Musculoskeletal: M/S 5/5 throughout.  No visible deformity.  Neurologic: CN 2-12 intact. Pain and light touch intact in extremities.  Symmetrical.  Speech is fluent. Motor exam as listed above. Psychiatric: Judgment intact, Mood & affect appropriate for pt's clinical situation. Dermatologic: No rashes or ulcers noted.  No changes consistent with cellulitis.   CBC Lab Results  Component Value Date   WBC 5.8 06/19/2023   HGB 9.8 (L) 06/19/2023   HCT 29.5 (L) 06/19/2023   MCV 71.1 (L) 06/19/2023   PLT 206 06/19/2023    BMET    Component Value Date/Time   NA 137 06/19/2023 0611   NA 142 02/10/2012 0812   K 3.8 06/19/2023 0611   CL 105 06/19/2023 0611   CL 110 (H) 02/10/2012 0812   CO2 23 06/19/2023 0611   CO2 29 02/10/2012 0812   GLUCOSE 99 06/19/2023 0611   GLUCOSE 86 02/10/2012 0812   BUN 18 06/19/2023 0611   CREATININE 0.84 06/19/2023 0611   CREATININE 0.83 04/30/2023 0812   CALCIUM  9.0 06/19/2023 0611   CALCIUM  9.0 02/10/2012 0812   GFRNONAA >60 06/19/2023 0611   GFRNONAA >60 04/30/2023 0812   CrCl cannot be calculated (Unknown ideal weight.).  COAG Lab Results  Component Value Date   INR 1.1 06/18/2023   INR 1.1 08/28/2021   INR 1.2 06/12/2020    Radiology ECHOCARDIOGRAM COMPLETE Result Date: 06/19/2023    ECHOCARDIOGRAM REPORT   Patient Name:   Susan Farrell Date of Exam: 06/19/2023 Medical Rec #:  [id number]        Height:       66.0 in Accession #:    [id number]       Weight:       177.0 lb Date of Birth:  1945/03/31        BSA:          1.899 m Patient Age:    77 years         BP:           124/65 mmHg Patient Gender: F                HR:           82 bpm. Exam Location:  [facility] Procedure: 2D Echo, Cardiac Doppler and Saline Contrast Bubble Study (Both             Spectral and Color Flow Doppler were  utilized during procedure). Indications:     Stroke I63.9  History:         Patient has prior history of Echocardiogram examinations, most                  recent 09/10/2022. Stroke; Risk Factors:Hypertension.  Sonographer:     Broadus Canes Referring Phys:  [id number] AMY N COX Diagnosing Phys: Percival Brace MD IMPRESSIONS  1. Left ventricular ejection fraction, by estimation, is 65 to 70%. The left ventricle has normal function. The left ventricle has no regional wall motion abnormalities. Left ventricular diastolic parameters are consistent with Grade I diastolic dysfunction (impaired relaxation).  2. Right ventricular systolic function is normal. The right ventricular size is normal.  3. The mitral valve is normal in structure. Trivial mitral valve regurgitation. No evidence of mitral stenosis.  4. The aortic valve is normal in structure. Aortic valve regurgitation is not visualized. No aortic stenosis is present.  5. The inferior vena cava is normal in size with greater than 50% respiratory variability, suggesting right atrial pressure of 3 mmHg.  6. Agitated saline contrast bubble study was negative, with no evidence of any interatrial shunt. FINDINGS  Left Ventricle: Left ventricular ejection fraction, by estimation, is 65 to 70%. The left ventricle has normal function. The left ventricle has no regional wall motion abnormalities. Strain was performed and the global longitudinal strain is indeterminate. The left ventricular internal cavity size was normal in size. There is no left ventricular hypertrophy. Left ventricular diastolic parameters are consistent with Grade I diastolic dysfunction (impaired relaxation). Right Ventricle: The right ventricular size is normal. No increase in right ventricular wall thickness. Right ventricular systolic function is normal. Left Atrium: Left atrial size was normal in size. Right Atrium: Right atrial size was normal in size.  Pericardium: There is no evidence of pericardial effusion. Mitral Valve: The mitral valve is normal in structure. Trivial mitral valve regurgitation. No evidence of mitral valve stenosis. MV peak gradient, 8.1 mmHg. The mean mitral valve gradient is 3.0 mmHg. Tricuspid Valve: The tricuspid valve is normal in structure. Tricuspid valve regurgitation is trivial. No evidence of tricuspid stenosis. Aortic Valve: The aortic valve is normal in structure. Aortic valve regurgitation is not visualized. No aortic stenosis is present. Aortic valve mean gradient measures 5.0 mmHg. Aortic valve peak gradient measures 10.6 mmHg. Aortic valve area, by VTI measures 2.81 cm. Pulmonic Valve: The pulmonic valve was normal in structure. Pulmonic valve regurgitation is not visualized. No evidence of pulmonic stenosis. Aorta: The aortic root is normal in size and structure. Venous: The inferior vena cava is normal in size with greater than 50% respiratory variability, suggesting right atrial pressure of 3 mmHg. IAS/Shunts: No atrial level shunt detected by color flow Doppler. Agitated saline contrast was given intravenously to evaluate for intracardiac shunting. Agitated saline contrast bubble study was negative, with no evidence of any interatrial shunt. Additional Comments: 3D was performed not requiring image post processing on an independent workstation and was indeterminate.  LEFT VENTRICLE PLAX 2D LVIDd:         4.20 cm   Diastology LVIDs:         2.30 cm   LV e' medial:    7.62 cm/s LV PW:         1.80 cm   LV E/e' medial:  10.9 LV IVS:        1.70 cm   LV e' lateral:   7.83 cm/s LVOT diam:  2.10 cm   LV E/e' lateral: 10.6 LV SV:         82 LV SV Index:   43 LVOT Area:     3.46 cm  RIGHT VENTRICLE RV Basal diam:  3.60 cm RV Mid diam:    2.70 cm RV S prime:     20.70 cm/s TAPSE (M-mode): 1.5 cm LEFT ATRIUM             Index        RIGHT ATRIUM           Index LA diam:        3.50 cm 1.84 cm/m   RA Area:     15.10 cm LA Vol  (A2C):   51.6 ml 27.18 ml/m  RA Volume:   35.00 ml  18.43 ml/m LA Vol (A4C):   45.8 ml 24.12 ml/m LA Biplane Vol: 48.8 ml 25.70 ml/m  AORTIC VALVE AV Area (Vmax):    2.59 cm AV Area (Vmean):   2.68 cm AV Area (VTI):     2.81 cm AV Vmax:           163.00 cm/s AV Vmean:          104.000 cm/s AV VTI:            0.292 m AV Peak Grad:      10.6 mmHg AV Mean Grad:      5.0 mmHg LVOT Vmax:         122.00 cm/s LVOT Vmean:        80.500 cm/s LVOT VTI:          0.237 m LVOT/AV VTI ratio: 0.81  AORTA Ao Root diam: 3.30 cm MITRAL VALVE                TRICUSPID VALVE MV Area (PHT): 3.21 cm     TR Peak grad:   19.0 mmHg MV Area VTI:   2.67 cm     TR Vmax:        218.00 cm/s MV Peak grad:  8.1 mmHg MV Mean grad:  3.0 mmHg     SHUNTS MV Vmax:       1.42 m/s     Systemic VTI:  0.24 m MV Vmean:      81.5 cm/s    Systemic Diam: 2.10 cm MV Decel Time: 236 msec MV E velocity: 83.30 cm/s MV A velocity: 121.00 cm/s MV E/A ratio:  0.69 Percival Brace MD Electronically signed by Percival Brace MD Signature Date/Time: 06/19/2023/11:53:37 AM    Final    CT ANGIO HEAD NECK W WO CM Result Date: 06/18/2023 CLINICAL DATA:  Stroke/TIA, determine embolic source. EXAM: CT ANGIOGRAPHY HEAD AND NECK WITH AND WITHOUT CONTRAST TECHNIQUE: Multidetector CT imaging of the head and neck was performed using the standard protocol during bolus administration of intravenous contrast. Multiplanar CT image reconstructions and MIPs were obtained to evaluate the vascular anatomy. Carotid stenosis measurements (when applicable) are obtained utilizing NASCET criteria, using the distal internal carotid diameter as the denominator. RADIATION DOSE REDUCTION: This exam was performed according to the departmental dose-optimization program which includes automated exposure control, adjustment of the mA and/or kV according to patient size and/or use of iterative reconstruction technique. CONTRAST:  75mL OMNIPAQUE  IOHEXOL  350 MG/ML SOLN COMPARISON:  CTA  head and neck 09/09/2022 FINDINGS: CTA NECK FINDINGS Aortic arch: Normal variant aortic arch branching pattern with common origin of the brachiocephalic and left common carotid arteries. Mild atherosclerosis without a significant  stenosis of the arch vessel origins. Right carotid system: Patent without evidence of a significant stenosis or dissection. Minimal atheromatous irregularity in the region of the carotid bifurcation. Left carotid system: Patent without evidence of a significant stenosis or dissection. Minimal calcified plaque at the carotid bifurcation. Vertebral arteries: Patent and codominant without evidence of stenosis or dissection. Skeleton: Cervical spondylosis most notable at C3-4 and C4-5. Other neck: No evidence of cervical lymphadenopathy or mass. Upper chest: Clear lung apices. Review of the MIP images confirms the above findings CTA HEAD FINDINGS Anterior circulation: The internal carotid arteries are patent from skull base to carotid termini with mild atherosclerosis not resulting in a significant stenosis. ACAs and MCAs are patent with mild branch vessel irregularity but no evidence of a proximal branch occlusion or significant proximal stenosis. No aneurysm is identified. Posterior circulation: The intracranial vertebral arteries are widely patent to the basilar. Patent PICA and SCA origins are visualized bilaterally. The basilar artery is widely patent. Posterior communicating arteries are diminutive or absent. Both PCAs are patent with branch vessel irregularity and asymmetric attenuation of distal branches on the right but without evidence of a significant proximal stenosis. No aneurysm is identified. Venous sinuses: Not well evaluated due to contrast timing. Anatomic variants: None. Review of the MIP images confirms the above findings IMPRESSION: 1. Mild atherosclerosis in the head and neck without a large vessel occlusion or significant proximal stenosis. 2.  Aortic Atherosclerosis  (ICD10-I70.0). Electronically Signed   By: Aundra Lee M.D.   On: 06/18/2023 19:07   MR BRAIN WO CONTRAST Result Date: 06/18/2023 CLINICAL DATA:  Provided history: Neuro deficit, acute, stroke suspected. EXAM: MRI HEAD WITHOUT CONTRAST TECHNIQUE: Multiplanar, multiecho pulse sequences of the brain and surrounding structures were obtained without intravenous contrast. COMPARISON:  Head CT 06/18/2023.  Brain MRI 09/09/2022. FINDINGS: Brain: Mild generalized cerebral atrophy. 3 mm acute/subacute infarct within the right parietal lobe at the gray-white junction (series 5, image 36) (series 7, image 13). Subdural hematomas again demonstrated along the bilateral cerebral convexities, unchanged in size. As before, the subdural hematoma on the right measures up to 5 mm in thickness and the subdural hematoma on the left measures up to 10 mm in thickness. No midline shift. Chronic lacunar infarcts again demonstrated within the right corona radiata and right basal ganglia. Chronic hemosiderin deposition associated with the chronic right basal ganglia lacunar infarct. Mild multifocal T2 FLAIR hyperintense signal abnormality elsewhere within the cerebral white matter, nonspecific but compatible with chronic small vessel ischemic disease. Known sizable chronic infarct within the right cerebellar hemisphere (SCA territory). Additional small chronic infarcts within the bilateral cerebellar hemispheres, unchanged. Partially empty sella turcica. No evidence of an intracranial mass. Vascular: Maintained flow voids within the proximal large arterial vessels. Skull and upper cervical spine: No focal worrisome marrow lesion. Sinuses/Orbits: No mass or acute finding within the imaged orbits. No significant paranasal sinus disease. IMPRESSION: 1. 3 mm acute/subacute infarct within the right parietal lobe. 2. Subdural hematomas along the bilateral cerebral convexities, unchanged in size from the head CT performed earlier today  (measuring up to 5 mm in thickness on the right and up to 10 mm in thickness on the left). No midline shift. 3. Background parenchymal atrophy, chronic small vessel ischemic disease and chronic infarcts, as described. Electronically Signed   By: Bascom Lily D.O.   On: 06/18/2023 14:25   CT HEAD WO CONTRAST Addendum Date: 06/18/2023 ADDENDUM REPORT: 06/18/2023 10:58 ADDENDUM: Study discussed by telephone with Dr. Jacquie Maudlin  on 06/18/2023 at 1046 hours. Electronically Signed   By: Marlise Simpers M.D.   On: 06/18/2023 10:58   Result Date: 06/18/2023 CLINICAL DATA:  78 year old female with headache for 2 days. History of right cerebellar infarct last year. EXAM: CT HEAD WITHOUT CONTRAST TECHNIQUE: Contiguous axial images were obtained from the base of the skull through the vertex without intravenous contrast. RADIATION DOSE REDUCTION: This exam was performed according to the departmental dose-optimization program which includes automated exposure control, adjustment of the mA and/or kV according to patient size and/or use of iterative reconstruction technique. COMPARISON:  Head CT 09/08/2022, brain MRI 09/09/2022 and earlier. FINDINGS: Brain: Mixed density bilateral subdural hematoma. These appear new from the August MRI. Right side more holo hemispheric hematoma measures up to 5 mm in thickness. Left side more localized appearing superior convexity hematoma measures up to 10 mm in thickness. See coronal images 38 and 41). Mild associated intracranial mass effect with no significant midline shift (series 2, image 14 and coronal image 31). No other intracranial hemorrhage identified. Basilar cisterns remain patent. Chronic infarcts in the right cerebellum SCA territory and the right basal ganglia with encephalomalacia. Stable gray-white differentiation elsewhere. Vascular: No suspicious intracranial vascular hyperdensity. Calcified atherosclerosis at the skull base. Skull: Stable and intact.  No fracture identified.  Sinuses/Orbits: Visualized paranasal sinuses and mastoids are clear. Other: No acute orbit or scalp soft tissue finding identified. IMPRESSION: 1. Positive for mixed density bilateral Subdural Hematomas, new since 09/09/2022. 2. Left side SDH is larger but more localized to the superior convexity, up to 10 mm in thickness. 3. Right SDH more generalized, 5 mm. 4. Mild associated intracranial mass effect with no significant midline shift. No skull fracture identified. 5. 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 Electronically Signed: By: Marlise Simpers M.D. On: 06/18/2023 10:42     Assessment/Plan There are no diagnoses linked to this encounter.   Devon Fogo, MD  06/20/2023 2:47 PM

## 2023-06-20 NOTE — Progress Notes (Signed)
 Occupational Therapy Treatment Patient Details Name: Susan Farrell MRN: [id number] DOB: 1945-08-03 Today's Date: 06/20/2023   History of present illness Pt is a 78 y.o. female presenting to hospital 06/18/23 with c/o L hand numbness/impaired grip; mild gradual onset HA past 2 days.  Imaging showing mixed density B SDH's (no midline shift) and 3 mm acute/subacute infarct within the R parietal lobe.  Pt admitted with SDH and acute stroke d/t ischemia.  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   OT comments  Ms Folden was seen for OT treatment on this date. Upon arrival to room pt in bed, eager for activity and agreeable to tx. Pt reports hx of prior strokes, unclear insight into current deficits - when asked about L weakness/dropping items pt states "I am having to use my L hand more now" despite continuing to use R hand for all one handed activities.   Pt requires MIN A don gown in standing, moderate posterior LOB without UE support. CGA tooth brushing in standing and requires significantly increased time as pt repeatedly drops toothpaste in L hand. MIN A + RW for ADL t/f ~15 ft, cues to keep L hand on RW and assist to manage RW. Green theraputty provided and HEP reviewed with good recall from prior strokes. Pt making good progress toward goals, will continue to follow POC. Discharge recommendation remains appropriate.        If plan is discharge home, recommend the following:  A little help with bathing/dressing/bathroom;Assistance with cooking/housework;Assist for transportation;Direct supervision/assist for medications management;Direct supervision/assist for financial management;Help with stairs or ramp for entrance;A little help with walking and/or transfers   Equipment Recommendations  Other (comment)    Recommendations for Other Services      Precautions /  Restrictions Precautions Precautions: Fall Recall of Precautions/Restrictions: Impaired Restrictions Weight Bearing Restrictions Per Provider Order: No       Mobility Bed Mobility Overal bed mobility: Needs Assistance Bed Mobility: Supine to Sit     Supine to sit: Supervision, HOB elevated          Transfers Overall transfer level: Needs assistance Equipment used: Rolling walker (2 wheels) Transfers: Sit to/from Stand Sit to Stand: Min assist                 Balance Overall balance assessment: Needs assistance Sitting-balance support: No upper extremity supported, Feet supported Sitting balance-Leahy Scale: Good     Standing balance support: No upper extremity supported Standing balance-Leahy Scale: Poor                             ADL either performed or assessed with clinical judgement   ADL Overall ADL's : Needs assistance/impaired                                       General ADL Comments: MIN A don gown in standing, moderate posterior LOB without assist for balance. CGA tooth brushing in standing and significanlty increased time as repeatedly drops toothpaste in L hand. MIN A + RW for ADL t/f ~15 ft, cues to keep L hand on RW.     Extremity/Trunk Assessment              Vision       Perception  Praxis     Communication Communication Communication: No apparent difficulties   Cognition Arousal: Alert Behavior During Therapy: WFL for tasks assessed/performed Cognition: Cognition impaired             OT - Cognition Comments: ? insight - when asked about L weakness/dropping items pt states "I am having to use my L hand more now" despite continuing to use R hand for all one handed activities.                 Following commands: Impaired Following commands impaired: Follows one step commands with increased time      Cueing   Cueing Techniques: Verbal cues, Gestural cues, Tactile cues, Visual cues   Exercises Exercises: Other exercises Other Exercises Other Exercises: green theraputty provided and HEP reviewed with good recall from prior strokes    Shoulder Instructions       General Comments      Pertinent Vitals/ Pain       Pain Assessment Pain Assessment: No/denies pain  Home Living                                          Prior Functioning/Environment              Frequency  Min 3X/week        Progress Toward Goals  OT Goals(current goals can now be found in the care plan section)  Progress towards OT goals: Progressing toward goals  Acute Rehab OT Goals OT Goal Formulation: With patient Time For Goal Achievement: 07/03/23 Potential to Achieve Goals: Fair ADL Goals Pt Will Perform Grooming: with modified independence;sitting Pt Will Perform Lower Body Dressing: with modified independence;sitting/lateral leans;sit to/from stand Pt Will Transfer to Toilet: with modified independence;ambulating Pt Will Perform Toileting - Clothing Manipulation and hygiene: with modified independence;sit to/from stand;sitting/lateral leans Pt/caregiver will Perform Home Exercise Program: Left upper extremity;Increased strength;With written HEP provided  Plan      Co-evaluation                 AM-PAC OT "6 Clicks" Daily Activity     Outcome Measure   Help from another person eating meals?: None Help from another person taking care of personal grooming?: A Little Help from another person toileting, which includes using toliet, bedpan, or urinal?: A Little Help from another person bathing (including washing, rinsing, drying)?: A Lot Help from another person to put on and taking off regular upper body clothing?: A Little Help from another person to put on and taking off regular lower body clothing?: A Lot 6 Click Score: 17    End of Session Equipment Utilized During Treatment: Rolling walker (2 wheels)  OT Visit Diagnosis: Other  abnormalities of gait and mobility (R26.89);Muscle weakness (generalized) (M62.81);Hemiplegia and hemiparesis Hemiplegia - Right/Left: Left Hemiplegia - dominant/non-dominant: Non-Dominant Hemiplegia - caused by: Cerebral infarction   Activity Tolerance Patient tolerated treatment well   Patient Left in chair;with call bell/phone within reach;with chair alarm set   Nurse Communication          Time: 1050-1105 OT Time Calculation (min): 15 min  Charges: OT General Charges $OT Visit: 1 Visit OT Treatments $Self Care/Home Management : 8-22 mins  Gordan Latina, M.S. OTR/L  06/20/23, 11:24 AM  ascom [phone]

## 2023-06-22 ENCOUNTER — Telehealth: Payer: Self-pay | Admitting: Neurosurgery

## 2023-06-22 ENCOUNTER — Encounter (INDEPENDENT_AMBULATORY_CARE_PROVIDER_SITE_OTHER): Payer: Self-pay | Admitting: Vascular Surgery

## 2023-06-22 ENCOUNTER — Ambulatory Visit (INDEPENDENT_AMBULATORY_CARE_PROVIDER_SITE_OTHER): Payer: Medicare HMO | Admitting: Vascular Surgery

## 2023-06-22 ENCOUNTER — Ambulatory Visit (INDEPENDENT_AMBULATORY_CARE_PROVIDER_SITE_OTHER): Payer: Medicare HMO

## 2023-06-22 VITALS — BP 102/67 | HR 61 | Resp 16 | Wt 172.0 lb

## 2023-06-22 DIAGNOSIS — I7133 Infrarenal abdominal aortic aneurysm, ruptured: Secondary | ICD-10-CM

## 2023-06-22 DIAGNOSIS — I10 Essential (primary) hypertension: Secondary | ICD-10-CM

## 2023-06-22 DIAGNOSIS — I701 Atherosclerosis of renal artery: Secondary | ICD-10-CM | POA: Diagnosis not present

## 2023-06-22 DIAGNOSIS — S065XAA Traumatic subdural hemorrhage with loss of consciousness status unknown, initial encounter: Secondary | ICD-10-CM

## 2023-06-22 DIAGNOSIS — I7143 Infrarenal abdominal aortic aneurysm, without rupture: Secondary | ICD-10-CM | POA: Diagnosis not present

## 2023-06-22 DIAGNOSIS — I639 Cerebral infarction, unspecified: Secondary | ICD-10-CM

## 2023-06-22 DIAGNOSIS — E785 Hyperlipidemia, unspecified: Secondary | ICD-10-CM

## 2023-06-22 NOTE — Telephone Encounter (Signed)
 CT order has been placed. I will send a message to scheduling.

## 2023-06-22 NOTE — Telephone Encounter (Signed)
 Patient called back and she is aware that a CT needs to be done first and then she will fu with neurosurgery. I will call her back.

## 2023-06-22 NOTE — Telephone Encounter (Signed)
 Left message to call back

## 2023-06-22 NOTE — Telephone Encounter (Signed)
 Jodeen Munch, MD  [facility]Neurosurgery Admin Needs follow-up appointment in 2 weeks with either me or one of the PAs.  15-minute appointment with me if on my schedule  Needs a CT scan without contrast prior to visit

## 2023-06-23 ENCOUNTER — Encounter (INDEPENDENT_AMBULATORY_CARE_PROVIDER_SITE_OTHER): Payer: Self-pay | Admitting: Vascular Surgery

## 2023-06-23 DIAGNOSIS — I700 Atherosclerosis of aorta: Secondary | ICD-10-CM | POA: Diagnosis not present

## 2023-06-23 DIAGNOSIS — F17210 Nicotine dependence, cigarettes, uncomplicated: Secondary | ICD-10-CM | POA: Diagnosis not present

## 2023-06-23 DIAGNOSIS — E785 Hyperlipidemia, unspecified: Secondary | ICD-10-CM | POA: Diagnosis not present

## 2023-06-23 DIAGNOSIS — D649 Anemia, unspecified: Secondary | ICD-10-CM | POA: Diagnosis not present

## 2023-06-23 DIAGNOSIS — E039 Hypothyroidism, unspecified: Secondary | ICD-10-CM | POA: Diagnosis not present

## 2023-06-23 DIAGNOSIS — Z9181 History of falling: Secondary | ICD-10-CM | POA: Diagnosis not present

## 2023-06-23 DIAGNOSIS — I119 Hypertensive heart disease without heart failure: Secondary | ICD-10-CM | POA: Diagnosis not present

## 2023-06-23 DIAGNOSIS — I69354 Hemiplegia and hemiparesis following cerebral infarction affecting left non-dominant side: Secondary | ICD-10-CM | POA: Diagnosis not present

## 2023-06-23 NOTE — Telephone Encounter (Signed)
 I spoke with the patient and transferred her to scheduling. I will reach back out to her to get her a follow up appointment scheduled.

## 2023-06-24 DIAGNOSIS — I700 Atherosclerosis of aorta: Secondary | ICD-10-CM | POA: Diagnosis not present

## 2023-06-24 DIAGNOSIS — F17210 Nicotine dependence, cigarettes, uncomplicated: Secondary | ICD-10-CM | POA: Diagnosis not present

## 2023-06-24 DIAGNOSIS — E039 Hypothyroidism, unspecified: Secondary | ICD-10-CM | POA: Diagnosis not present

## 2023-06-24 DIAGNOSIS — I119 Hypertensive heart disease without heart failure: Secondary | ICD-10-CM | POA: Diagnosis not present

## 2023-06-24 DIAGNOSIS — D649 Anemia, unspecified: Secondary | ICD-10-CM | POA: Diagnosis not present

## 2023-06-24 DIAGNOSIS — Z9181 History of falling: Secondary | ICD-10-CM | POA: Diagnosis not present

## 2023-06-24 DIAGNOSIS — E785 Hyperlipidemia, unspecified: Secondary | ICD-10-CM | POA: Diagnosis not present

## 2023-06-24 DIAGNOSIS — I69354 Hemiplegia and hemiparesis following cerebral infarction affecting left non-dominant side: Secondary | ICD-10-CM | POA: Diagnosis not present

## 2023-06-24 NOTE — Telephone Encounter (Addendum)
 07/06/2023 CT 07/15/2023 in office visit

## 2023-06-26 DIAGNOSIS — E785 Hyperlipidemia, unspecified: Secondary | ICD-10-CM | POA: Diagnosis not present

## 2023-06-26 DIAGNOSIS — Z9181 History of falling: Secondary | ICD-10-CM | POA: Diagnosis not present

## 2023-06-26 DIAGNOSIS — F17210 Nicotine dependence, cigarettes, uncomplicated: Secondary | ICD-10-CM | POA: Diagnosis not present

## 2023-06-26 DIAGNOSIS — I119 Hypertensive heart disease without heart failure: Secondary | ICD-10-CM | POA: Diagnosis not present

## 2023-06-26 DIAGNOSIS — I700 Atherosclerosis of aorta: Secondary | ICD-10-CM | POA: Diagnosis not present

## 2023-06-26 DIAGNOSIS — D649 Anemia, unspecified: Secondary | ICD-10-CM | POA: Diagnosis not present

## 2023-06-26 DIAGNOSIS — E039 Hypothyroidism, unspecified: Secondary | ICD-10-CM | POA: Diagnosis not present

## 2023-06-26 DIAGNOSIS — I69354 Hemiplegia and hemiparesis following cerebral infarction affecting left non-dominant side: Secondary | ICD-10-CM | POA: Diagnosis not present

## 2023-06-29 DIAGNOSIS — E785 Hyperlipidemia, unspecified: Secondary | ICD-10-CM | POA: Diagnosis not present

## 2023-06-29 DIAGNOSIS — I69354 Hemiplegia and hemiparesis following cerebral infarction affecting left non-dominant side: Secondary | ICD-10-CM | POA: Diagnosis not present

## 2023-06-29 DIAGNOSIS — D649 Anemia, unspecified: Secondary | ICD-10-CM | POA: Diagnosis not present

## 2023-06-29 DIAGNOSIS — I119 Hypertensive heart disease without heart failure: Secondary | ICD-10-CM | POA: Diagnosis not present

## 2023-06-29 DIAGNOSIS — F17210 Nicotine dependence, cigarettes, uncomplicated: Secondary | ICD-10-CM | POA: Diagnosis not present

## 2023-06-29 DIAGNOSIS — E039 Hypothyroidism, unspecified: Secondary | ICD-10-CM | POA: Diagnosis not present

## 2023-06-29 DIAGNOSIS — Z9181 History of falling: Secondary | ICD-10-CM | POA: Diagnosis not present

## 2023-06-29 DIAGNOSIS — I700 Atherosclerosis of aorta: Secondary | ICD-10-CM | POA: Diagnosis not present

## 2023-07-02 DIAGNOSIS — F17210 Nicotine dependence, cigarettes, uncomplicated: Secondary | ICD-10-CM | POA: Diagnosis not present

## 2023-07-02 DIAGNOSIS — I119 Hypertensive heart disease without heart failure: Secondary | ICD-10-CM | POA: Diagnosis not present

## 2023-07-02 DIAGNOSIS — I69354 Hemiplegia and hemiparesis following cerebral infarction affecting left non-dominant side: Secondary | ICD-10-CM | POA: Diagnosis not present

## 2023-07-02 DIAGNOSIS — D649 Anemia, unspecified: Secondary | ICD-10-CM | POA: Diagnosis not present

## 2023-07-02 DIAGNOSIS — Z9181 History of falling: Secondary | ICD-10-CM | POA: Diagnosis not present

## 2023-07-02 DIAGNOSIS — E039 Hypothyroidism, unspecified: Secondary | ICD-10-CM | POA: Diagnosis not present

## 2023-07-02 DIAGNOSIS — E785 Hyperlipidemia, unspecified: Secondary | ICD-10-CM | POA: Diagnosis not present

## 2023-07-02 DIAGNOSIS — I700 Atherosclerosis of aorta: Secondary | ICD-10-CM | POA: Diagnosis not present

## 2023-07-06 ENCOUNTER — Ambulatory Visit
Admission: RE | Admit: 2023-07-06 | Discharge: 2023-07-06 | Disposition: A | Discharge status: Home / Self Care | Source: Ambulatory Visit | Attending: Neurosurgery | Admitting: Neurosurgery

## 2023-07-06 DIAGNOSIS — R9082 White matter disease, unspecified: Secondary | ICD-10-CM | POA: Diagnosis not present

## 2023-07-06 DIAGNOSIS — S065XAA Traumatic subdural hemorrhage with loss of consciousness status unknown, initial encounter: Secondary | ICD-10-CM | POA: Insufficient documentation

## 2023-07-06 DIAGNOSIS — G9389 Other specified disorders of brain: Secondary | ICD-10-CM | POA: Diagnosis not present

## 2023-07-06 DIAGNOSIS — I6203 Nontraumatic chronic subdural hemorrhage: Secondary | ICD-10-CM | POA: Diagnosis not present

## 2023-07-07 NOTE — Progress Notes (Signed)
 Referring Physician:  Sadie Manna, MD [address] [facility] [place],  [place] [address]  Primary Physician:  Sadie Manna, MD  History of Present Illness: 07/13/2023 Susan Farrell  with hx of hypertension, discoid lupus, prior strokes with residual left-sided weakness from a right basal ganglia stroke of August 2023, hypertension hyperlipidemia, hypothyroidism is here today for follow-up of subdural hematoma found approximately 3 weeks ago when she presented to the ED with left hand numbness.  Since then she feels as though the numbness and weakness in her hand has improved.  Overall however patient does feel as though she is doing better.  She denies any headaches or weakness.  She has no concerns at this time.    Review of Systems:  A 10 point review of systems is negative, except for the pertinent positives and negatives detailed in the HPI.  Past Medical History: Past Medical History:  Diagnosis Date   Hypertension    Stroke [facility])     Past Surgical History: Past Surgical History:  Procedure Laterality Date   ABDOMINAL HYSTERECTOMY     CHOLECYSTECTOMY     ENDOVASCULAR REPAIR/STENT GRAFT N/A 06/12/2020   Procedure: ENDOVASCULAR REPAIR/STENT GRAFT;  Surgeon: Jama Cordella MATSU, MD;  Location: [facility] INVASIVE CV LAB;  Service: Cardiovascular;  Laterality: N/A;    Allergies: Allergies as of 07/13/2023   (No Known Allergies)    Medications: Outpatient Encounter Medications as of 07/13/2023  Medication Sig   acetaminophen  (TYLENOL ) 325 MG tablet Take 2 tablets (650 mg total) by mouth every 4 (four) hours as needed for mild pain (or temp > 37.5 C (99.5 F)).   amLODipine  (NORVASC ) 5 MG tablet Take 5 mg by mouth daily.   Calcium  Carbonate-Vitamin D  600-200 MG-UNIT TABS Take 1 tablet by mouth daily.   clobetasol  cream (TEMOVATE ) 0.05 % Apply topically 2 (two) times daily as needed.   docusate sodium  (COLACE) 100 MG capsule Take 1 capsule (100 mg  total) by mouth daily.   iron  polysaccharides (FERREX 150) 150 MG capsule Take 1 capsule (150 mg total) by mouth daily.   levothyroxine  (SYNTHROID ) 88 MCG tablet Take 88 mcg by mouth daily before breakfast.   metoprolol  succinate (TOPROL -XL) 50 MG 24 hr tablet Take 1 tablet (50 mg total) by mouth daily.   Multiple Vitamins-Minerals (WOMENS MULTIVITAMIN + COLLAGEN PO) Take 1 tablet by mouth daily.   rosuvastatin  (CRESTOR ) 40 MG tablet Take 1 tablet (40 mg total) by mouth daily.   No facility-administered encounter medications on file as of 07/13/2023.    Social History: Social History   Tobacco Use   Smoking status: Former    Current packs/day: 0.00    Types: Cigarettes    Quit date: 09/10/2020    Years since quitting: 2.8   Smokeless tobacco: Current  Vaping Use   Vaping status: Never Used  Substance Use Topics   Alcohol  use: Not Currently   Drug use: Never    Family Medical History: Family History  Problem Relation Age of Onset   Hypertension Mother    Hypertension Father    Diabetes Sister    Breast cancer Neg Hx     Physical Examination: @VITALWITHPAIN @  General: Patient is well developed, well nourished, calm, collected, and in no apparent distress. Attention to examination is appropriate.  Psychiatric: Patient is non-anxious.  Head:  Pupils equal, round, and reactive to light.  ENT:  Oral mucosa appears well hydrated.  Neck:   Supple.  Full range of motion.  Respiratory: Patient is breathing without any difficulty.  Extremities: No edema.  Vascular: Palpable dorsal pedal pulses.  Skin:   On exposed skin, there are no abnormal skin lesions.  NEUROLOGICAL:     Awake, alert, oriented to person, place, and time.  Speech is clear and fluent. Fund of knowledge is appropriate.   Cranial Nerves: Pupils equal round and reactive to light.  Facial tone is symmetric.  Cranial nerves grossly intact.  Strength: Side Biceps Triceps Deltoid Interossei Grip Wrist  Ext. Wrist Flex.  R 5 5 5 5 5 5 5   L 5 5 5 5 5 5 5     Patient has significant residual left lower extremity weakness secondary to her stroke.   Medical Decision Making  Imaging: EXAM: CT HEAD WITHOUT CONTRAST   TECHNIQUE: Contiguous axial images were obtained from the base of the skull through the vertex without intravenous contrast.   RADIATION DOSE REDUCTION: This exam was performed according to the departmental dose-optimization program which includes automated exposure control, adjustment of the mA and/or kV according to patient size and/or use of iterative reconstruction technique.   COMPARISON:  CT of the head dated Jun 18, 2023   FINDINGS: Brain: Interval there complete resolution of right-sided holo hemispheric subdural hematoma and significant interval improvement of left chronic subdural hematoma, which has decreased in thickness on the coronal images from 10 mm to 6 mm. There is persistent marginal shift of midline structures to the right. Chronic encephalomalacia changes again noted within the right cerebellar hemisphere and right basal ganglia. Mild periventricular white matter disease. No adverse interval change.   Vascular: Mild to moderate calcific atheromatous disease.   Skull: Intact and unremarkable.   Sinuses/Orbits: Clear and unremarkable.   Other: None.   IMPRESSION: 1. Significant interval improvement of left-sided chronic subdural hematoma and near total resolution of right holo hemispheric subdural hematoma. 2. Chronic encephalomalacia changes within the right cerebellar hemisphere and basal ganglia.  I have personally reviewed the images and agree with the above interpretation.  Assessment and Plan: Susan Farrell is a pleasant 78 y.o. female with hx of hypertension, discoid lupus, prior strokes with residual left-sided weakness from a right basal ganglia stroke of August 2023, hypertension hyperlipidemia, hypothyroidism is here today for  follow-up of subdural hematoma found approximately 3 weeks ago when she presented to the ED with left hand numbness.  Since then she feels as though the numbness and weakness in her hand has improved.  Overall however patient does feel as though she is doing better.  She denies any headaches or weakness.  She has no concerns at this time.  Her examination is to baseline.  Repeat head CT recently completed which shows significant interval improvement of her left-sided chronic subdural hematoma and near-total resolution of her right subdural hematoma.  Patient may resume lengthening medication in the next week.  No scheduled repeat imaging at this time.  Encouraged her to reach out with any questions or concerns she has.  Red flag symptoms were reviewed in which she should go to her nearest emergency department.   Thank you for involving me in the care of this patient.   I spent a total of 30 minutes in both face-to-face and non-face-to-face activities for this visit on the date of this encounter including prior to the patient, obtaining and reviewing separately obtained history, performing medically appropriate examination, documenting clinical relation, independently interpreting results.  Lyle Decamp, PA-C Dept. of Neurosurgery

## 2023-07-09 DIAGNOSIS — D649 Anemia, unspecified: Secondary | ICD-10-CM | POA: Diagnosis not present

## 2023-07-09 DIAGNOSIS — F17210 Nicotine dependence, cigarettes, uncomplicated: Secondary | ICD-10-CM | POA: Diagnosis not present

## 2023-07-09 DIAGNOSIS — E785 Hyperlipidemia, unspecified: Secondary | ICD-10-CM | POA: Diagnosis not present

## 2023-07-09 DIAGNOSIS — I700 Atherosclerosis of aorta: Secondary | ICD-10-CM | POA: Diagnosis not present

## 2023-07-09 DIAGNOSIS — I69354 Hemiplegia and hemiparesis following cerebral infarction affecting left non-dominant side: Secondary | ICD-10-CM | POA: Diagnosis not present

## 2023-07-09 DIAGNOSIS — I119 Hypertensive heart disease without heart failure: Secondary | ICD-10-CM | POA: Diagnosis not present

## 2023-07-09 DIAGNOSIS — Z9181 History of falling: Secondary | ICD-10-CM | POA: Diagnosis not present

## 2023-07-09 DIAGNOSIS — E039 Hypothyroidism, unspecified: Secondary | ICD-10-CM | POA: Diagnosis not present

## 2023-07-13 ENCOUNTER — Encounter: Payer: Self-pay | Admitting: Physician Assistant

## 2023-07-13 ENCOUNTER — Ambulatory Visit (INDEPENDENT_AMBULATORY_CARE_PROVIDER_SITE_OTHER): Admitting: Physician Assistant

## 2023-07-13 VITALS — BP 132/88

## 2023-07-13 DIAGNOSIS — F17210 Nicotine dependence, cigarettes, uncomplicated: Secondary | ICD-10-CM | POA: Diagnosis not present

## 2023-07-13 DIAGNOSIS — E785 Hyperlipidemia, unspecified: Secondary | ICD-10-CM | POA: Diagnosis not present

## 2023-07-13 DIAGNOSIS — S065XAA Traumatic subdural hemorrhage with loss of consciousness status unknown, initial encounter: Secondary | ICD-10-CM

## 2023-07-13 DIAGNOSIS — I119 Hypertensive heart disease without heart failure: Secondary | ICD-10-CM | POA: Diagnosis not present

## 2023-07-13 DIAGNOSIS — E039 Hypothyroidism, unspecified: Secondary | ICD-10-CM | POA: Diagnosis not present

## 2023-07-13 DIAGNOSIS — Z9181 History of falling: Secondary | ICD-10-CM | POA: Diagnosis not present

## 2023-07-13 DIAGNOSIS — I700 Atherosclerosis of aorta: Secondary | ICD-10-CM | POA: Diagnosis not present

## 2023-07-13 DIAGNOSIS — I69354 Hemiplegia and hemiparesis following cerebral infarction affecting left non-dominant side: Secondary | ICD-10-CM | POA: Diagnosis not present

## 2023-07-13 DIAGNOSIS — D649 Anemia, unspecified: Secondary | ICD-10-CM | POA: Diagnosis not present

## 2023-07-13 DIAGNOSIS — S065XAD Traumatic subdural hemorrhage with loss of consciousness status unknown, subsequent encounter: Secondary | ICD-10-CM

## 2023-07-16 DIAGNOSIS — I69354 Hemiplegia and hemiparesis following cerebral infarction affecting left non-dominant side: Secondary | ICD-10-CM | POA: Diagnosis not present

## 2023-07-16 DIAGNOSIS — D649 Anemia, unspecified: Secondary | ICD-10-CM | POA: Diagnosis not present

## 2023-07-16 DIAGNOSIS — I119 Hypertensive heart disease without heart failure: Secondary | ICD-10-CM | POA: Diagnosis not present

## 2023-07-16 DIAGNOSIS — E039 Hypothyroidism, unspecified: Secondary | ICD-10-CM | POA: Diagnosis not present

## 2023-07-16 DIAGNOSIS — Z9181 History of falling: Secondary | ICD-10-CM | POA: Diagnosis not present

## 2023-07-16 DIAGNOSIS — F17210 Nicotine dependence, cigarettes, uncomplicated: Secondary | ICD-10-CM | POA: Diagnosis not present

## 2023-07-16 DIAGNOSIS — I700 Atherosclerosis of aorta: Secondary | ICD-10-CM | POA: Diagnosis not present

## 2023-07-16 DIAGNOSIS — E785 Hyperlipidemia, unspecified: Secondary | ICD-10-CM | POA: Diagnosis not present

## 2023-07-22 DIAGNOSIS — F17210 Nicotine dependence, cigarettes, uncomplicated: Secondary | ICD-10-CM | POA: Diagnosis not present

## 2023-07-22 DIAGNOSIS — I69354 Hemiplegia and hemiparesis following cerebral infarction affecting left non-dominant side: Secondary | ICD-10-CM | POA: Diagnosis not present

## 2023-07-22 DIAGNOSIS — D649 Anemia, unspecified: Secondary | ICD-10-CM | POA: Diagnosis not present

## 2023-07-22 DIAGNOSIS — I119 Hypertensive heart disease without heart failure: Secondary | ICD-10-CM | POA: Diagnosis not present

## 2023-07-22 DIAGNOSIS — Z9181 History of falling: Secondary | ICD-10-CM | POA: Diagnosis not present

## 2023-07-22 DIAGNOSIS — E785 Hyperlipidemia, unspecified: Secondary | ICD-10-CM | POA: Diagnosis not present

## 2023-07-22 DIAGNOSIS — E039 Hypothyroidism, unspecified: Secondary | ICD-10-CM | POA: Diagnosis not present

## 2023-07-22 DIAGNOSIS — I700 Atherosclerosis of aorta: Secondary | ICD-10-CM | POA: Diagnosis not present

## 2023-07-29 DIAGNOSIS — I700 Atherosclerosis of aorta: Secondary | ICD-10-CM | POA: Diagnosis not present

## 2023-07-29 DIAGNOSIS — E039 Hypothyroidism, unspecified: Secondary | ICD-10-CM | POA: Diagnosis not present

## 2023-07-29 DIAGNOSIS — I119 Hypertensive heart disease without heart failure: Secondary | ICD-10-CM | POA: Diagnosis not present

## 2023-07-29 DIAGNOSIS — E785 Hyperlipidemia, unspecified: Secondary | ICD-10-CM | POA: Diagnosis not present

## 2023-07-29 DIAGNOSIS — Z9181 History of falling: Secondary | ICD-10-CM | POA: Diagnosis not present

## 2023-07-29 DIAGNOSIS — I69354 Hemiplegia and hemiparesis following cerebral infarction affecting left non-dominant side: Secondary | ICD-10-CM | POA: Diagnosis not present

## 2023-07-29 DIAGNOSIS — F17210 Nicotine dependence, cigarettes, uncomplicated: Secondary | ICD-10-CM | POA: Diagnosis not present

## 2023-07-29 DIAGNOSIS — D649 Anemia, unspecified: Secondary | ICD-10-CM | POA: Diagnosis not present

## 2023-08-05 DIAGNOSIS — M21061 Valgus deformity, not elsewhere classified, right knee: Secondary | ICD-10-CM | POA: Diagnosis not present

## 2023-08-05 DIAGNOSIS — M21062 Valgus deformity, not elsewhere classified, left knee: Secondary | ICD-10-CM | POA: Diagnosis not present

## 2023-08-05 DIAGNOSIS — M170 Bilateral primary osteoarthritis of knee: Secondary | ICD-10-CM | POA: Diagnosis not present

## 2023-08-07 DIAGNOSIS — I119 Hypertensive heart disease without heart failure: Secondary | ICD-10-CM | POA: Diagnosis not present

## 2023-08-07 DIAGNOSIS — E039 Hypothyroidism, unspecified: Secondary | ICD-10-CM | POA: Diagnosis not present

## 2023-08-07 DIAGNOSIS — Z9181 History of falling: Secondary | ICD-10-CM | POA: Diagnosis not present

## 2023-08-07 DIAGNOSIS — I69354 Hemiplegia and hemiparesis following cerebral infarction affecting left non-dominant side: Secondary | ICD-10-CM | POA: Diagnosis not present

## 2023-08-07 DIAGNOSIS — I700 Atherosclerosis of aorta: Secondary | ICD-10-CM | POA: Diagnosis not present

## 2023-08-07 DIAGNOSIS — D649 Anemia, unspecified: Secondary | ICD-10-CM | POA: Diagnosis not present

## 2023-08-07 DIAGNOSIS — E785 Hyperlipidemia, unspecified: Secondary | ICD-10-CM | POA: Diagnosis not present

## 2023-08-07 DIAGNOSIS — F17210 Nicotine dependence, cigarettes, uncomplicated: Secondary | ICD-10-CM | POA: Diagnosis not present

## 2023-08-12 DIAGNOSIS — D649 Anemia, unspecified: Secondary | ICD-10-CM | POA: Diagnosis not present

## 2023-08-12 DIAGNOSIS — Z9181 History of falling: Secondary | ICD-10-CM | POA: Diagnosis not present

## 2023-08-12 DIAGNOSIS — E785 Hyperlipidemia, unspecified: Secondary | ICD-10-CM | POA: Diagnosis not present

## 2023-08-12 DIAGNOSIS — E039 Hypothyroidism, unspecified: Secondary | ICD-10-CM | POA: Diagnosis not present

## 2023-08-12 DIAGNOSIS — I119 Hypertensive heart disease without heart failure: Secondary | ICD-10-CM | POA: Diagnosis not present

## 2023-08-12 DIAGNOSIS — I700 Atherosclerosis of aorta: Secondary | ICD-10-CM | POA: Diagnosis not present

## 2023-08-12 DIAGNOSIS — I69354 Hemiplegia and hemiparesis following cerebral infarction affecting left non-dominant side: Secondary | ICD-10-CM | POA: Diagnosis not present

## 2023-08-12 DIAGNOSIS — F17210 Nicotine dependence, cigarettes, uncomplicated: Secondary | ICD-10-CM | POA: Diagnosis not present

## 2023-08-26 DIAGNOSIS — Z8673 Personal history of transient ischemic attack (TIA), and cerebral infarction without residual deficits: Secondary | ICD-10-CM | POA: Diagnosis not present

## 2023-08-26 DIAGNOSIS — I10 Essential (primary) hypertension: Secondary | ICD-10-CM | POA: Diagnosis not present

## 2023-08-26 DIAGNOSIS — Z8679 Personal history of other diseases of the circulatory system: Secondary | ICD-10-CM | POA: Diagnosis not present

## 2023-08-26 DIAGNOSIS — D649 Anemia, unspecified: Secondary | ICD-10-CM | POA: Diagnosis not present

## 2023-08-26 DIAGNOSIS — M25561 Pain in right knee: Secondary | ICD-10-CM | POA: Diagnosis not present

## 2023-09-10 DIAGNOSIS — R8290 Unspecified abnormal findings in urine: Secondary | ICD-10-CM | POA: Diagnosis not present

## 2023-09-14 DIAGNOSIS — R531 Weakness: Secondary | ICD-10-CM | POA: Diagnosis not present

## 2023-09-14 DIAGNOSIS — Z8673 Personal history of transient ischemic attack (TIA), and cerebral infarction without residual deficits: Secondary | ICD-10-CM | POA: Diagnosis not present

## 2023-09-14 DIAGNOSIS — G4733 Obstructive sleep apnea (adult) (pediatric): Secondary | ICD-10-CM | POA: Diagnosis not present

## 2023-09-15 ENCOUNTER — Ambulatory Visit
Admission: RE | Admit: 2023-09-15 | Discharge: 2023-09-15 | Disposition: A | Discharge status: Home / Self Care | Source: Ambulatory Visit | Attending: Internal Medicine | Admitting: Internal Medicine

## 2023-09-15 DIAGNOSIS — Z1231 Encounter for screening mammogram for malignant neoplasm of breast: Secondary | ICD-10-CM | POA: Diagnosis not present

## 2023-09-23 DIAGNOSIS — M170 Bilateral primary osteoarthritis of knee: Secondary | ICD-10-CM | POA: Diagnosis not present

## 2023-10-05 DIAGNOSIS — M6281 Muscle weakness (generalized): Secondary | ICD-10-CM | POA: Diagnosis not present

## 2023-10-15 DIAGNOSIS — I6930 Unspecified sequelae of cerebral infarction: Secondary | ICD-10-CM | POA: Diagnosis not present

## 2023-10-15 DIAGNOSIS — M6281 Muscle weakness (generalized): Secondary | ICD-10-CM | POA: Diagnosis not present

## 2023-10-20 DIAGNOSIS — I6930 Unspecified sequelae of cerebral infarction: Secondary | ICD-10-CM | POA: Diagnosis not present

## 2023-10-20 DIAGNOSIS — M6281 Muscle weakness (generalized): Secondary | ICD-10-CM | POA: Diagnosis not present

## 2023-10-22 DIAGNOSIS — I6930 Unspecified sequelae of cerebral infarction: Secondary | ICD-10-CM | POA: Diagnosis not present

## 2023-10-22 DIAGNOSIS — M6281 Muscle weakness (generalized): Secondary | ICD-10-CM | POA: Diagnosis not present

## 2023-11-04 DIAGNOSIS — M6281 Muscle weakness (generalized): Secondary | ICD-10-CM | POA: Diagnosis not present

## 2023-11-04 DIAGNOSIS — I6930 Unspecified sequelae of cerebral infarction: Secondary | ICD-10-CM | POA: Diagnosis not present

## 2023-11-06 DIAGNOSIS — M6281 Muscle weakness (generalized): Secondary | ICD-10-CM | POA: Diagnosis not present

## 2023-11-06 DIAGNOSIS — I6930 Unspecified sequelae of cerebral infarction: Secondary | ICD-10-CM | POA: Diagnosis not present

## 2023-11-10 DIAGNOSIS — M6281 Muscle weakness (generalized): Secondary | ICD-10-CM | POA: Diagnosis not present

## 2023-11-10 DIAGNOSIS — I6930 Unspecified sequelae of cerebral infarction: Secondary | ICD-10-CM | POA: Diagnosis not present

## 2023-11-12 DIAGNOSIS — I6930 Unspecified sequelae of cerebral infarction: Secondary | ICD-10-CM | POA: Diagnosis not present

## 2023-11-12 DIAGNOSIS — M6281 Muscle weakness (generalized): Secondary | ICD-10-CM | POA: Diagnosis not present

## 2023-11-16 DIAGNOSIS — M170 Bilateral primary osteoarthritis of knee: Secondary | ICD-10-CM | POA: Diagnosis not present

## 2023-11-16 DIAGNOSIS — M21069 Valgus deformity, not elsewhere classified, unspecified knee: Secondary | ICD-10-CM | POA: Diagnosis not present

## 2023-11-17 DIAGNOSIS — I6930 Unspecified sequelae of cerebral infarction: Secondary | ICD-10-CM | POA: Diagnosis not present

## 2023-11-17 DIAGNOSIS — M6281 Muscle weakness (generalized): Secondary | ICD-10-CM | POA: Diagnosis not present

## 2023-11-19 DIAGNOSIS — M6281 Muscle weakness (generalized): Secondary | ICD-10-CM | POA: Diagnosis not present

## 2023-11-19 DIAGNOSIS — I6930 Unspecified sequelae of cerebral infarction: Secondary | ICD-10-CM | POA: Diagnosis not present

## 2023-11-23 DIAGNOSIS — M6281 Muscle weakness (generalized): Secondary | ICD-10-CM | POA: Diagnosis not present

## 2023-11-23 DIAGNOSIS — I6930 Unspecified sequelae of cerebral infarction: Secondary | ICD-10-CM | POA: Diagnosis not present

## 2023-11-25 DIAGNOSIS — M6281 Muscle weakness (generalized): Secondary | ICD-10-CM | POA: Diagnosis not present

## 2023-11-25 DIAGNOSIS — I6930 Unspecified sequelae of cerebral infarction: Secondary | ICD-10-CM | POA: Diagnosis not present

## 2023-12-02 DIAGNOSIS — M6281 Muscle weakness (generalized): Secondary | ICD-10-CM | POA: Diagnosis not present

## 2023-12-02 DIAGNOSIS — I6930 Unspecified sequelae of cerebral infarction: Secondary | ICD-10-CM | POA: Diagnosis not present

## 2023-12-09 DIAGNOSIS — M6281 Muscle weakness (generalized): Secondary | ICD-10-CM | POA: Diagnosis not present

## 2023-12-09 DIAGNOSIS — I6930 Unspecified sequelae of cerebral infarction: Secondary | ICD-10-CM | POA: Diagnosis not present

## 2024-04-29 ENCOUNTER — Other Ambulatory Visit

## 2024-05-13 ENCOUNTER — Ambulatory Visit: Admitting: Internal Medicine

## 2024-06-20 ENCOUNTER — Ambulatory Visit (INDEPENDENT_AMBULATORY_CARE_PROVIDER_SITE_OTHER): Admitting: Vascular Surgery

## 2024-06-20 ENCOUNTER — Other Ambulatory Visit (INDEPENDENT_AMBULATORY_CARE_PROVIDER_SITE_OTHER)
